# Patient Record
Sex: Female | Born: 1945
Health system: Southern US, Community
[De-identification: ages and names within clinical notes are randomized; demographics above are authoritative.]

## PROBLEM LIST (undated history)

## (undated) ENCOUNTER — Emergency Department: Admission: EM | Payer: Medicare HMO | Source: Home / Self Care

## (undated) DIAGNOSIS — N368 Other specified disorders of urethra: Secondary | ICD-10-CM

## (undated) DIAGNOSIS — E079 Disorder of thyroid, unspecified: Secondary | ICD-10-CM

## (undated) DIAGNOSIS — I440 Atrioventricular block, first degree: Secondary | ICD-10-CM

## (undated) DIAGNOSIS — M2041 Other hammer toe(s) (acquired), right foot: Secondary | ICD-10-CM

## (undated) DIAGNOSIS — R739 Hyperglycemia, unspecified: Secondary | ICD-10-CM

## (undated) DIAGNOSIS — H43391 Other vitreous opacities, right eye: Secondary | ICD-10-CM

## (undated) DIAGNOSIS — L84 Corns and callosities: Secondary | ICD-10-CM

## (undated) DIAGNOSIS — R630 Anorexia: Secondary | ICD-10-CM

## (undated) DIAGNOSIS — M4120 Other idiopathic scoliosis, site unspecified: Secondary | ICD-10-CM

## (undated) DIAGNOSIS — I10 Essential (primary) hypertension: Secondary | ICD-10-CM

## (undated) DIAGNOSIS — T7840XA Allergy, unspecified, initial encounter: Secondary | ICD-10-CM

## (undated) DIAGNOSIS — K219 Gastro-esophageal reflux disease without esophagitis: Secondary | ICD-10-CM

## (undated) DIAGNOSIS — E785 Hyperlipidemia, unspecified: Secondary | ICD-10-CM

## (undated) DIAGNOSIS — H9313 Tinnitus, bilateral: Secondary | ICD-10-CM

## (undated) DIAGNOSIS — F32A Depression, unspecified: Secondary | ICD-10-CM

## (undated) HISTORY — PX: BREAST SURGERY: SHX581

## (undated) HISTORY — PX: BREAST EXCISIONAL BIOPSY: SUR124

## (undated) HISTORY — DX: Anorexia: R63.0

## (undated) HISTORY — DX: Atrioventricular block, first degree: I44.0

## (undated) HISTORY — DX: Disorder of thyroid, unspecified: E07.9

## (undated) HISTORY — DX: Corns and callosities: L84

## (undated) HISTORY — DX: Allergy, unspecified, initial encounter: T78.40XA

## (undated) HISTORY — DX: Other vitreous opacities, right eye: H43.391

## (undated) HISTORY — DX: Other idiopathic scoliosis, site unspecified: M41.20

## (undated) HISTORY — DX: Other hammer toe(s) (acquired), right foot: M20.41

## (undated) HISTORY — DX: Essential (primary) hypertension: I10

## (undated) HISTORY — DX: Hyperlipidemia, unspecified: E78.5

## (undated) HISTORY — DX: Depression, unspecified: F32.A

## (undated) HISTORY — DX: Gastro-esophageal reflux disease without esophagitis: K21.9

## (undated) HISTORY — DX: Other specified disorders of urethra: N36.8

## (undated) HISTORY — DX: Tinnitus, bilateral: H93.13

## (undated) HISTORY — DX: Hyperglycemia, unspecified: R73.9

---

## 1987-10-28 HISTORY — PX: TUMOR EXCISION: SHX421

## 1987-10-28 HISTORY — PX: ABDOMINAL HYSTERECTOMY: SHX81

## 1987-10-28 HISTORY — PX: APPENDECTOMY: SHX54

## 2004-10-10 ENCOUNTER — Ambulatory Visit: Payer: Self-pay | Admitting: Internal Medicine

## 2005-05-28 ENCOUNTER — Ambulatory Visit: Payer: Self-pay

## 2006-02-26 ENCOUNTER — Other Ambulatory Visit: Payer: Self-pay

## 2006-02-26 ENCOUNTER — Emergency Department: Payer: Self-pay | Admitting: Internal Medicine

## 2006-06-16 ENCOUNTER — Ambulatory Visit: Payer: Self-pay

## 2006-08-10 ENCOUNTER — Ambulatory Visit: Payer: Self-pay | Admitting: Family Medicine

## 2006-12-03 ENCOUNTER — Emergency Department: Payer: Self-pay | Admitting: General Practice

## 2006-12-04 ENCOUNTER — Emergency Department: Payer: Self-pay | Admitting: Emergency Medicine

## 2007-08-18 ENCOUNTER — Ambulatory Visit: Payer: Self-pay

## 2008-04-01 ENCOUNTER — Emergency Department: Payer: Self-pay | Admitting: Emergency Medicine

## 2008-08-31 ENCOUNTER — Ambulatory Visit: Payer: Self-pay | Admitting: Family Medicine

## 2009-09-13 ENCOUNTER — Ambulatory Visit: Payer: Self-pay

## 2010-11-18 ENCOUNTER — Ambulatory Visit: Payer: Self-pay | Admitting: Family Medicine

## 2011-04-15 ENCOUNTER — Emergency Department: Payer: Self-pay | Admitting: Emergency Medicine

## 2011-05-21 ENCOUNTER — Emergency Department: Payer: Self-pay | Admitting: *Deleted

## 2011-06-24 LAB — HM COLONOSCOPY: HM Colonoscopy: NORMAL

## 2011-11-20 ENCOUNTER — Ambulatory Visit: Payer: Self-pay | Admitting: Family Medicine

## 2012-06-09 ENCOUNTER — Emergency Department: Payer: Self-pay | Admitting: Emergency Medicine

## 2012-06-09 LAB — COMPREHENSIVE METABOLIC PANEL
Albumin: 4.1 g/dL (ref 3.4–5.0)
Alkaline Phosphatase: 69 U/L (ref 50–136)
Bilirubin,Total: 0.4 mg/dL (ref 0.2–1.0)
Creatinine: 0.76 mg/dL (ref 0.60–1.30)
Glucose: 116 mg/dL — ABNORMAL HIGH (ref 65–99)
Osmolality: 278 (ref 275–301)
Sodium: 140 mmol/L (ref 136–145)

## 2012-06-09 LAB — TROPONIN I: Troponin-I: <0.02 ng/mL

## 2012-06-09 LAB — CBC
HCT: 38.1 % (ref 35.0–47.0)
MCHC: 33.7 g/dL (ref 32.0–36.0)
MCV: 89 fL (ref 80–100)
RBC: 4.26 10*6/uL (ref 3.80–5.20)
RDW: 14.3 % (ref 11.5–14.5)
WBC: 3.9 10*3/uL (ref 3.6–11.0)

## 2012-06-09 LAB — CK TOTAL AND CKMB (NOT AT ARMC)
CK, Total: 105 U/L (ref 21–215)
CK-MB: <0.5 ng/mL — ABNORMAL LOW (ref 0.5–3.6)

## 2012-07-05 ENCOUNTER — Emergency Department: Payer: Self-pay | Admitting: Emergency Medicine

## 2012-07-10 ENCOUNTER — Emergency Department: Payer: Self-pay | Admitting: Emergency Medicine

## 2012-07-10 LAB — CK TOTAL AND CKMB (NOT AT ARMC)
CK, Total: 148 U/L (ref 21–215)
CK-MB: 0.6 ng/mL (ref 0.5–3.6)

## 2012-07-10 LAB — TROPONIN I: Troponin-I: <0.02 ng/mL

## 2012-07-10 LAB — CBC
HGB: 12.9 g/dL (ref 12.0–16.0)
MCH: 29.8 pg (ref 26.0–34.0)
MCHC: 33.5 g/dL (ref 32.0–36.0)
Platelet: 209 10*3/uL (ref 150–440)
RBC: 4.33 10*6/uL (ref 3.80–5.20)
RDW: 14.2 % (ref 11.5–14.5)

## 2012-07-10 LAB — BASIC METABOLIC PANEL
Calcium, Total: 9.2 mg/dL (ref 8.5–10.1)
Chloride: 102 mmol/L (ref 98–107)
Co2: 28 mmol/L (ref 21–32)
EGFR (African American): >60
EGFR (Non-African Amer.): >60
Glucose: 120 mg/dL — ABNORMAL HIGH (ref 65–99)
Osmolality: 274 (ref 275–301)
Sodium: 138 mmol/L (ref 136–145)

## 2012-11-22 ENCOUNTER — Ambulatory Visit: Payer: Self-pay | Admitting: Family Medicine

## 2013-11-25 ENCOUNTER — Ambulatory Visit: Payer: Self-pay | Admitting: Family Medicine

## 2014-11-27 ENCOUNTER — Ambulatory Visit: Payer: Self-pay | Admitting: Family Medicine

## 2014-11-27 DIAGNOSIS — Z1231 Encounter for screening mammogram for malignant neoplasm of breast: Secondary | ICD-10-CM | POA: Diagnosis not present

## 2014-11-27 LAB — HM MAMMOGRAPHY: HM MAMMO: NORMAL

## 2014-12-01 DIAGNOSIS — J302 Other seasonal allergic rhinitis: Secondary | ICD-10-CM | POA: Diagnosis not present

## 2014-12-01 DIAGNOSIS — E039 Hypothyroidism, unspecified: Secondary | ICD-10-CM | POA: Diagnosis not present

## 2014-12-01 DIAGNOSIS — R739 Hyperglycemia, unspecified: Secondary | ICD-10-CM | POA: Diagnosis not present

## 2014-12-01 DIAGNOSIS — Z13220 Encounter for screening for lipoid disorders: Secondary | ICD-10-CM | POA: Diagnosis not present

## 2014-12-01 DIAGNOSIS — K219 Gastro-esophageal reflux disease without esophagitis: Secondary | ICD-10-CM | POA: Diagnosis not present

## 2014-12-01 DIAGNOSIS — Z79899 Other long term (current) drug therapy: Secondary | ICD-10-CM | POA: Diagnosis not present

## 2014-12-08 DIAGNOSIS — Z79899 Other long term (current) drug therapy: Secondary | ICD-10-CM | POA: Diagnosis not present

## 2014-12-08 DIAGNOSIS — Z13220 Encounter for screening for lipoid disorders: Secondary | ICD-10-CM | POA: Diagnosis not present

## 2014-12-08 DIAGNOSIS — R739 Hyperglycemia, unspecified: Secondary | ICD-10-CM | POA: Diagnosis not present

## 2014-12-08 DIAGNOSIS — E039 Hypothyroidism, unspecified: Secondary | ICD-10-CM | POA: Diagnosis not present

## 2014-12-08 LAB — LIPID PANEL
Cholesterol: 192 mg/dL (ref 0–200)
HDL: 51 mg/dL (ref 35–70)
LDL Cholesterol: 130 mg/dL
TRIGLYCERIDES: 55 mg/dL (ref 40–160)

## 2014-12-08 LAB — HEMOGLOBIN A1C: HEMOGLOBIN A1C: 6.1 % — AB (ref 4.0–6.0)

## 2015-01-11 DIAGNOSIS — M2041 Other hammer toe(s) (acquired), right foot: Secondary | ICD-10-CM | POA: Diagnosis not present

## 2015-01-11 DIAGNOSIS — H43391 Other vitreous opacities, right eye: Secondary | ICD-10-CM | POA: Diagnosis not present

## 2015-01-17 ENCOUNTER — Ambulatory Visit: Payer: Self-pay | Admitting: Podiatry

## 2015-01-31 ENCOUNTER — Ambulatory Visit: Payer: Commercial Managed Care - HMO | Admitting: Podiatry

## 2015-02-19 ENCOUNTER — Ambulatory Visit: Payer: Commercial Managed Care - HMO | Admitting: Podiatry

## 2015-04-02 ENCOUNTER — Encounter: Payer: Self-pay | Admitting: Family Medicine

## 2015-04-02 ENCOUNTER — Ambulatory Visit (INDEPENDENT_AMBULATORY_CARE_PROVIDER_SITE_OTHER): Payer: Commercial Managed Care - HMO | Admitting: Family Medicine

## 2015-04-02 DIAGNOSIS — E785 Hyperlipidemia, unspecified: Secondary | ICD-10-CM | POA: Insufficient documentation

## 2015-04-02 DIAGNOSIS — M419 Scoliosis, unspecified: Secondary | ICD-10-CM | POA: Insufficient documentation

## 2015-04-02 DIAGNOSIS — I8393 Asymptomatic varicose veins of bilateral lower extremities: Secondary | ICD-10-CM | POA: Insufficient documentation

## 2015-04-02 DIAGNOSIS — K219 Gastro-esophageal reflux disease without esophagitis: Secondary | ICD-10-CM

## 2015-04-02 DIAGNOSIS — N368 Other specified disorders of urethra: Secondary | ICD-10-CM | POA: Diagnosis not present

## 2015-04-02 DIAGNOSIS — M2041 Other hammer toe(s) (acquired), right foot: Secondary | ICD-10-CM | POA: Insufficient documentation

## 2015-04-02 DIAGNOSIS — L84 Corns and callosities: Secondary | ICD-10-CM | POA: Insufficient documentation

## 2015-04-02 DIAGNOSIS — E039 Hypothyroidism, unspecified: Secondary | ICD-10-CM | POA: Diagnosis not present

## 2015-04-02 MED ORDER — ESTROGENS, CONJUGATED 0.625 MG/GM VA CREA: 1.0000 g | TOPICAL_CREAM | Freq: Every day | VAGINAL | Status: DC

## 2015-04-02 NOTE — Patient Instructions (Signed)
Hypothyroidism The thyroid is a large gland located in the lower front of your neck. The thyroid gland helps control metabolism. Metabolism is how your body handles food. It controls metabolism with the hormone thyroxine. When this gland is underactive (hypothyroid), it produces too little hormone.  CAUSES These include:   Absence or destruction of thyroid tissue.  Goiter due to iodine deficiency.  Goiter due to medications.  Congenital defects (since birth).  Problems with the pituitary. This causes a lack of TSH (thyroid stimulating hormone). This hormone tells the thyroid to turn out more hormone. SYMPTOMS  Lethargy (feeling as though you have no energy)  Cold intolerance  Weight gain (in spite of normal food intake)  Dry skin  Coarse hair  Menstrual irregularity (if severe, may lead to infertility)  Slowing of thought processes Cardiac problems are also caused by insufficient amounts of thyroid hormone. Hypothyroidism in the newborn is cretinism, and is an extreme form. It is important that this form be treated adequately and immediately or it will lead rapidly to retarded physical and mental development. DIAGNOSIS  To prove hypothyroidism, your caregiver may do blood tests and ultrasound tests. Sometimes the signs are hidden. It may be necessary for your caregiver to watch this illness with blood tests either before or after diagnosis and treatment. TREATMENT  Low levels of thyroid hormone are increased by using synthetic thyroid hormone. This is a safe, effective treatment. It usually takes about four weeks to gain the full effects of the medication. After you have the full effect of the medication, it will generally take another four weeks for problems to leave. Your caregiver may start you on low doses. If you have had heart problems the dose may be gradually increased. It is generally not an emergency to get rapidly to normal. HOME CARE INSTRUCTIONS   Take your  medications as your caregiver suggests. Let your caregiver know of any medications you are taking or start taking. Your caregiver will help you with dosage schedules.  As your condition improves, your dosage needs may increase. It will be necessary to have continuing blood tests as suggested by your caregiver.  Report all suspected medication side effects to your caregiver. SEEK MEDICAL CARE IF: Seek medical care if you develop:  Sweating.  Tremulousness (tremors).  Anxiety.  Rapid weight loss.  Heat intolerance.  Emotional swings.  Diarrhea.  Weakness. SEEK IMMEDIATE MEDICAL CARE IF:  You develop chest pain, an irregular heart beat (palpitations), or a rapid heart beat. MAKE SURE YOU:   Understand these instructions.  Will watch your condition.  Will get help right away if you are not doing well or get worse. Document Released: 10/13/2005 Document Revised: 01/05/2012 Document Reviewed: 06/02/2008 Exit[employer] Patient Information 2015 [employer]. This information is not intended to replace advice given to you by your health care provider. Make sure you discuss any questions you have with your health care provider.  

## 2015-04-02 NOTE — Progress Notes (Signed)
Name: Maureen [name]   MRN: [id number]    DOB: September 07, 1946   Date:04/02/2015       Progress Note  Subjective  Chief Complaint  Chief Complaint  Patient presents with  . Allergic Rhinitis   . Hypothyroidism  . Hyperlipidemia  . Gastrophageal Reflux    HPI  Hypothyroidism: She is taking medication as prescribed and denies side effects. She denies fatigue, constipation, dry skin, or difficulty swallowing.  Hyperlipidemia: she never started taking Atorvastatin, she is aware of the increase risk of stroke and heart attacks but does not want to take a chance on side effects of medications  GERD: under control with medications, she is gaining weight again, and denies any symptoms.  Urethral prolapse:  She was diagnosed on her last CPE last fall, s/p hysterectomy and sometimes she sees a little bleeding when she wipes.  No dysuria, no blood when voiding.   Patient Active Problem List   Diagnosis Date Noted  . Hypothyroidism (acquired) 04/02/2015  . Gastroesophageal reflux disease 04/02/2015  . Hammer toe of right foot 04/02/2015  . Atrioventricular bloc first degree 04/02/2015  . Scoliosis of thoracic spine 04/02/2015  . Urethral prolapse 04/02/2015  . Corn of toe 04/02/2015  . Hyperlipidemia 04/02/2015  . Varicose veins of both lower extremities 04/02/2015    History  Substance Use Topics  . Smoking status: Never Smoker   . Smokeless tobacco: Never Used  . Alcohol Use: 0.0 oz/week    0 Standard drinks or equivalent per week     Comment: occasional wine     Current outpatient prescriptions:  .  calcium-vitamin D (OSCAL WITH D) 500-200 MG-UNIT per tablet, Take 1 tablet by mouth daily., Disp: , Rfl:  .  fluticasone (FLONASE) 50 MCG/ACT nasal spray, Place 1 spray into both nostrils daily., Disp: , Rfl:  .  Garlic Oil 1000 MG CAPS, Take 1 capsule by mouth daily., Disp: , Rfl:  .  glucose-Vitamin C 4-0.006 GM CHEW chewable tablet, Chew 1 tablet by mouth 1 day or 1 dose., Disp: ,  Rfl:  .  levothyroxine (SYNTHROID, LEVOTHROID) 75 MCG tablet, Take 75 mcg by mouth daily before breakfast., Disp: , Rfl:  .  ranitidine (ZANTAC) 300 MG capsule, Take 300 mg by mouth every evening., Disp: , Rfl:   Allergies  Allergen Reactions  . Codeine Nausea And Vomiting  . Penicillins Rash    ROS   Constitutional: Negative for fever Respiratory: Negative for cough and shortness of breath.   Cardiovascular: Negative for chest pain or palpitations.  Gastrointestinal: Negative for abdominal pain, no bowel changes.  Musculoskeletal: Negative for gait problem or joint swelling.  Skin: Negative for rash.  Neurological: Negative for dizziness or headache.  No other specific complaints in a complete review of systems (except as listed in HPI above).  Objective  Filed Vitals:   04/02/15 0934  BP: 120/66  Pulse: 88  Temp: 97.8 F (36.6 C)  TempSrc: Oral  Resp: 16  Height: 5' 1.75" (1.568 m)  Weight: 132 lb (59.875 kg)  SpO2: 96%     Physical Exam  Constitutional: Patient appears well-developed and well-nourished. No distress.  HENT: Head: Normocephalic and atraumatic. Ears: B TMs ok, no erythema or effusion; Nose: Nose normal. Mouth/Throat: Oropharynx is clear and [name]. No oropharyngeal exudate.  Eyes: Conjunctivae and EOM are normal. Pupils are equal, round, and reactive to light. No scleral icterus.  Neck: Normal range of motion. Neck supple. No JVD present. No thyromegaly present.  Cardiovascular:  Normal rate, regular rhythm and normal heart sounds.  No murmur heard. No BLE edema. Some spider veins and varicose veins Pulmonary/Chest: Effort normal and breath sounds normal. No respiratory distress. Abdominal: Soft. Bowel sounds are normal, no distension. There is no tenderness. no masses Musculoskeletal: Normal range of motion, no joint effusions. No gross deformities Neurological: he is alert and oriented to person, place, and time. No cranial nerve deficit. Coordination,  balance, strength, speech and gait are normal.  Skin: Skin is warm and dry. No rash noted. No erythema.  Psychiatric: Patient has a normal mood and affect. behavior is normal. Judgment and thought content normal.    Depression screen PHQ 2/9 04/02/2015  Decreased Interest 0  Down, Depressed, Hopeless 0  PHQ - 2 Score 0   Fall Risk: Fall Risk  04/02/2015  Falls in the past year? No      Assessment & Plan  1. Gastroesophageal reflux disease without esophagitis Controlled, continue medication 2. Hypothyroidism (acquired) Continue current dose of Synthroid, doing well 3. Urethral prolapse Causing some discomfort, we will try Premarin topically and return is no resolution

## 2015-04-11 DIAGNOSIS — H35371 Puckering of macula, right eye: Secondary | ICD-10-CM | POA: Diagnosis not present

## 2015-04-11 DIAGNOSIS — H2513 Age-related nuclear cataract, bilateral: Secondary | ICD-10-CM | POA: Diagnosis not present

## 2015-05-25 ENCOUNTER — Other Ambulatory Visit: Payer: Self-pay | Admitting: Family Medicine

## 2015-06-27 ENCOUNTER — Other Ambulatory Visit: Payer: Self-pay | Admitting: Family Medicine

## 2015-06-27 ENCOUNTER — Ambulatory Visit: Payer: Commercial Managed Care - HMO | Admitting: Family Medicine

## 2015-06-27 NOTE — Telephone Encounter (Signed)
Patient requesting refill. 

## 2015-06-29 ENCOUNTER — Encounter: Payer: Self-pay | Admitting: Family Medicine

## 2015-06-29 DIAGNOSIS — I440 Atrioventricular block, first degree: Secondary | ICD-10-CM | POA: Insufficient documentation

## 2015-06-29 DIAGNOSIS — M2040 Other hammer toe(s) (acquired), unspecified foot: Secondary | ICD-10-CM | POA: Insufficient documentation

## 2015-06-29 DIAGNOSIS — L84 Corns and callosities: Secondary | ICD-10-CM | POA: Insufficient documentation

## 2015-06-29 DIAGNOSIS — J302 Other seasonal allergic rhinitis: Secondary | ICD-10-CM | POA: Insufficient documentation

## 2015-06-29 DIAGNOSIS — R739 Hyperglycemia, unspecified: Secondary | ICD-10-CM | POA: Insufficient documentation

## 2015-06-29 DIAGNOSIS — H9313 Tinnitus, bilateral: Secondary | ICD-10-CM | POA: Insufficient documentation

## 2015-06-29 DIAGNOSIS — H43399 Other vitreous opacities, unspecified eye: Secondary | ICD-10-CM | POA: Insufficient documentation

## 2015-06-29 DIAGNOSIS — Z8679 Personal history of other diseases of the circulatory system: Secondary | ICD-10-CM | POA: Insufficient documentation

## 2015-07-04 ENCOUNTER — Ambulatory Visit: Payer: Commercial Managed Care - HMO | Admitting: Family Medicine

## 2015-07-20 ENCOUNTER — Ambulatory Visit (INDEPENDENT_AMBULATORY_CARE_PROVIDER_SITE_OTHER): Payer: Commercial Managed Care - HMO | Admitting: Family Medicine

## 2015-07-30 ENCOUNTER — Encounter: Payer: Self-pay | Admitting: Family Medicine

## 2015-07-30 ENCOUNTER — Ambulatory Visit (INDEPENDENT_AMBULATORY_CARE_PROVIDER_SITE_OTHER): Payer: Commercial Managed Care - HMO | Admitting: Family Medicine

## 2015-07-30 VITALS — BP 126/84 | HR 93 | Temp 98.1°F | Resp 18 | Ht 62.0 in | Wt 136.1 lb

## 2015-07-30 DIAGNOSIS — K219 Gastro-esophageal reflux disease without esophagitis: Secondary | ICD-10-CM

## 2015-07-30 DIAGNOSIS — E039 Hypothyroidism, unspecified: Secondary | ICD-10-CM

## 2015-07-30 DIAGNOSIS — R739 Hyperglycemia, unspecified: Secondary | ICD-10-CM

## 2015-07-30 DIAGNOSIS — Z79899 Other long term (current) drug therapy: Secondary | ICD-10-CM | POA: Diagnosis not present

## 2015-07-30 DIAGNOSIS — I8393 Asymptomatic varicose veins of bilateral lower extremities: Secondary | ICD-10-CM

## 2015-07-30 DIAGNOSIS — E785 Hyperlipidemia, unspecified: Secondary | ICD-10-CM

## 2015-07-30 DIAGNOSIS — Z23 Encounter for immunization: Secondary | ICD-10-CM | POA: Diagnosis not present

## 2015-07-30 DIAGNOSIS — M25462 Effusion, left knee: Secondary | ICD-10-CM | POA: Diagnosis not present

## 2015-07-30 NOTE — Progress Notes (Signed)
Name: Maureen [name]   MRN: [id number]    DOB: 1946-01-24   Date:07/30/2015       Progress Note  Subjective  Chief Complaint  Chief Complaint  Patient presents with  . Medication Refill    follow-up  . Hypothyroidism  . Gastrophageal Reflux  . Edema    left leg onset-2days patient states has been soaking in epsion salt    HPI    Hypothyroidism: She is taking medication as prescribed and denies side effects. She denies fatigue, constipation, dry skin, or difficulty swallowing. Current dose of Synthroid is 75 mcg daily   Hyperlipidemia: she never started taking Atorvastatin, she is aware of the increase risk of stroke and heart attacks but does not want to take a chance on side effects of medications.   GERD: under control with medications, she is gaining weight again, and denies any symptoms, she is down to one pill daily and denies side effects.  Hyperglycemia: on diet and exercise regiment only, last hgbA1C in Feb 2016 was 6.1 %.  Left Knee effusion: she denies any pain, but noticed an effusion of left knee, no redness, no increase in warmth. No previous history of gout, she has a family history of RA, but none of her other joints are causing problems at this time.   Seasonal Allergies: takes medications prn   Patient Active Problem List   Diagnosis Date Noted  . Effusion of left knee 07/30/2015  . Allergic rhinitis, seasonal 06/29/2015  . Clavus 06/29/2015  . 1St degree AV block 06/29/2015  . Hammer toe 06/29/2015  . H/O: HTN (hypertension) 06/29/2015  . Blood glucose elevated 06/29/2015  . Floater, vitreous 06/29/2015  . Bilateral tinnitus 06/29/2015  . Hypothyroidism (acquired) 04/02/2015  . Gastroesophageal reflux disease 04/02/2015  . Hammer toe of right foot 04/02/2015  . Scoliosis of thoracic spine 04/02/2015  . Urethral prolapse 04/02/2015  . Corn of toe 04/02/2015  . Hyperlipidemia 04/02/2015  . Varicose veins of both lower extremities 04/02/2015    Past  Surgical History  Procedure Laterality Date  . Abdominal hysterectomy  1989  . Tumor excision  1989    same time as hysterectomy  . Breast surgery      Several  . Appendectomy  1989    Family History  Problem Relation Age of Onset  . Diabetes Father 83    DM complications  . Hypertension Mother 29    CKD  . Thyroid disease Mother   . Heart failure Mother   . Kidney disease Mother   . Kidney disease    . Cancer Sister     Breast    Social History   Social History  . Marital Status: Divorced    Spouse Name: N/A  . Number of Children: N/A  . Years of Education: N/A   Occupational History  . Not on file.   Social History Main Topics  . Smoking status: Never Smoker   . Smokeless tobacco: Never Used  . Alcohol Use: 0.0 oz/week    0 Standard drinks or equivalent per week     Comment: occasional wine  . Drug Use: No  . Sexual Activity: Not Currently   Other Topics Concern  . Not on file   Social History Narrative     Current outpatient prescriptions:  .  calcium-vitamin D (OSCAL WITH D) 500-200 MG-UNIT per tablet, Take 1 tablet by mouth daily., Disp: , Rfl:  .  fluticasone (FLONASE) 50 MCG/ACT nasal spray, Place 1 spray  into both nostrils daily., Disp: , Rfl:  .  Garlic Oil 5852 MG CAPS, Take 1 capsule by mouth daily., Disp: , Rfl:  .  glucose-Vitamin C 4-0.006 GM CHEW chewable tablet, Chew 1 tablet by mouth 1 day or 1 dose., Disp: , Rfl:  .  levothyroxine (SYNTHROID, LEVOTHROID) 75 MCG tablet, TAKE ONE TABLET BY MOUTH ONCE DAILY, Disp: 30 tablet, Rfl: 2 .  ranitidine (ZANTAC) 300 MG capsule, Take 300 mg by mouth every evening., Disp: , Rfl:   Allergies  Allergen Reactions  . Aspirin     Tinnitus  . Citalopram Other (See Comments)  . Codeine Nausea And Vomiting  . Penicillins Rash     ROS  Constitutional: Negative for fever , mild  weight change.  Respiratory: Negative for cough and shortness of breath.   Cardiovascular: Negative for chest pain or  palpitations.  Gastrointestinal: Negative for abdominal pain, no bowel changes.  Musculoskeletal: Negative for gait problem or joint swelling.  Skin: Negative for rash.  Neurological: Negative for dizziness or headache.  No other specific complaints in a complete review of systems (except as listed in HPI above).  Objective  Filed Vitals:   07/30/15 0821  BP: 126/84  Pulse: 93  Temp: 98.1 F (36.7 C)  TempSrc: Oral  Resp: 18  Height: _0  (1.575 m)  Weight: 136 lb 1.6 oz (61.735 kg)  SpO2: 94%    Body mass index is 24.89 kg/(m^2).  Physical Exam  Constitutional: Patient appears well-developed and well-nourished.  No distress.  HEENT: head atraumatic, normocephalic, pupils equal and reactive to light, neck supple, throat within normal limits Cardiovascular: Normal rate, regular rhythm and normal heart sounds.  No murmur heard. No BLE edema. Varicose veins ( spider veing both lower legs)  Pulmonary/Chest: Effort normal and breath sounds normal. No respiratory distress. Abdominal: Soft.  There is no tenderness. Psychiatric: Patient has a normal mood and affect. behavior is normal. Judgment and thought content normal. Muscular Skeletal: crepitus with extension of left knee, no redness or increase in warmth, normal ROM   PHQ2/9: Depression screen [facility] 2/9 07/30/2015 04/02/2015  Decreased Interest 0 0  Down, Depressed, Hopeless 0 0  PHQ - 2 Score 0 0    Fall Risk: Fall Risk  07/30/2015 04/02/2015  Falls in the past year? No No     Functional Status Survey: Is the patient deaf or have difficulty hearing?: No Does the patient have difficulty seeing, even when wearing glasses/contacts?: Yes (reading glasses) Does the patient have difficulty concentrating, remembering, or making decisions?: No Does the patient have difficulty walking or climbing stairs?: No Does the patient have difficulty dressing or bathing?: No Does the patient have difficulty doing errands alone such as  visiting a doctor's office or shopping?: Yes (does not drive)   Assessment & Plan  1. Gastroesophageal reflux disease without esophagitis  Doing well with one pill daily   2. Needs flu shot  - Flu vaccine HIGH DOSE PF (Fluzone High dose) - refused  3. Hypothyroidism (acquired)   Recheck level  - TSH  4. Hyperlipidemia  Refused to take statin therapy   5. Varicose veins of both lower extremities   Sometimes needs to raise her legs, usually at the end of the day, not significant  6. Effusion of left knee  Likely OA with crepitus with extension of left knee, advised to take ibuprofen for a few days and if no resolution call back for x-ray - CBC with Differential/Platelet - Sed Rate (  ESR)  7. Need for shingles vaccine  - Varicella-zoster vaccine subcutaneous -refused  8. Need for Tdap vaccination  - Tdap vaccine greater than or equal to 7yo IM - refused  9. Need for pneumococcal vaccination  - Pneumococcal conjugate vaccine 13-valent IM -refused - Pneumococcal polysaccharide vaccine 23-valent greater than or equal to 2yo subcutaneous/IM -refused 10. Hyperglycemia  Discussed diet and exercise - Hemoglobin A1c  11. Long-term use of high-risk medication  - Comprehensive metabolic panel

## 2015-07-30 NOTE — Patient Instructions (Signed)

## 2015-07-31 LAB — COMPREHENSIVE METABOLIC PANEL
A/G RATIO: 1.5 (ref 1.1–2.5)
ALT: 17 IU/L (ref 0–32)
AST: 27 IU/L (ref 0–40)
Albumin: 4.6 g/dL (ref 3.6–4.8)
Alkaline Phosphatase: 85 IU/L (ref 39–117)
BILIRUBIN TOTAL: 0.3 mg/dL (ref 0.0–1.2)
BUN/Creatinine Ratio: 11 (ref 11–26)
BUN: 8 mg/dL (ref 8–27)
CALCIUM: 9.3 mg/dL (ref 8.7–10.3)
CHLORIDE: 100 mmol/L (ref 97–108)
CO2: 28 mmol/L (ref 18–29)
Creatinine, Ser: 0.73 mg/dL (ref 0.57–1.00)
GFR calc Af Amer: 97 mL/min/{1.73_m2} (ref >59)
GFR, EST NON AFRICAN AMERICAN: 84 mL/min/{1.73_m2} (ref >59)
GLUCOSE: 54 mg/dL — AB (ref 65–99)
Globulin, Total: 3 g/dL (ref 1.5–4.5)
POTASSIUM: 4.9 mmol/L (ref 3.5–5.2)
Sodium: 142 mmol/L (ref 134–144)
Total Protein: 7.6 g/dL (ref 6.0–8.5)

## 2015-07-31 LAB — CBC WITH DIFFERENTIAL/PLATELET
BASOS ABS: 0 10*3/uL (ref 0.0–0.2)
Basos: 1 %
EOS (ABSOLUTE): 0.2 10*3/uL (ref 0.0–0.4)
Eos: 4 %
Hematocrit: 35.4 % (ref 34.0–46.6)
Hemoglobin: 12.2 g/dL (ref 11.1–15.9)
IMMATURE GRANS (ABS): 0 10*3/uL (ref 0.0–0.1)
IMMATURE GRANULOCYTES: 0 %
LYMPHS: 53 %
Lymphocytes Absolute: 2.3 10*3/uL (ref 0.7–3.1)
MCH: 29.8 pg (ref 26.6–33.0)
MCHC: 34.5 g/dL (ref 31.5–35.7)
MCV: 87 fL (ref 79–97)
Monocytes Absolute: 0.5 10*3/uL (ref 0.1–0.9)
Monocytes: 11 %
NEUTROS PCT: 31 %
Neutrophils Absolute: 1.3 10*3/uL — ABNORMAL LOW (ref 1.4–7.0)
PLATELETS: 228 10*3/uL (ref 150–379)
RBC: 4.09 x10E6/uL (ref 3.77–5.28)
RDW: 14 % (ref 12.3–15.4)
WBC: 4.3 10*3/uL (ref 3.4–10.8)

## 2015-07-31 LAB — SEDIMENTATION RATE: SED RATE: 9 mm/h (ref 0–40)

## 2015-07-31 LAB — HEMOGLOBIN A1C
ESTIMATED AVERAGE GLUCOSE: 128 mg/dL
Hgb A1c MFr Bld: 6.1 % — ABNORMAL HIGH (ref 4.8–5.6)

## 2015-07-31 LAB — TSH: TSH: 2.47 u[IU]/mL (ref 0.450–4.500)

## 2015-08-06 ENCOUNTER — Telehealth: Payer: Self-pay | Admitting: Family Medicine

## 2015-08-06 NOTE — Telephone Encounter (Signed)
Patient is requesting that you mail her the lab results along with the directions as to what she need to do to get her numbers down.

## 2015-08-07 NOTE — Progress Notes (Signed)
Pt notified and labs mailed to her

## 2015-09-18 ENCOUNTER — Other Ambulatory Visit: Payer: Self-pay | Admitting: Family Medicine

## 2015-09-18 NOTE — Telephone Encounter (Signed)
Patient requesting refill. 

## 2015-10-31 ENCOUNTER — Ambulatory Visit: Payer: Commercial Managed Care - HMO | Admitting: Family Medicine

## 2015-11-07 ENCOUNTER — Ambulatory Visit: Payer: Commercial Managed Care - HMO | Admitting: Family Medicine

## 2015-11-09 ENCOUNTER — Encounter: Payer: Self-pay | Admitting: Family Medicine

## 2015-11-09 ENCOUNTER — Ambulatory Visit (INDEPENDENT_AMBULATORY_CARE_PROVIDER_SITE_OTHER): Payer: Commercial Managed Care - HMO | Admitting: Family Medicine

## 2015-11-09 VITALS — BP 132/76 | HR 100 | Temp 97.1°F | Resp 16 | Wt 135.2 lb

## 2015-11-09 DIAGNOSIS — I8393 Asymptomatic varicose veins of bilateral lower extremities: Secondary | ICD-10-CM | POA: Diagnosis not present

## 2015-11-09 DIAGNOSIS — E039 Hypothyroidism, unspecified: Secondary | ICD-10-CM | POA: Diagnosis not present

## 2015-11-09 DIAGNOSIS — K219 Gastro-esophageal reflux disease without esophagitis: Secondary | ICD-10-CM | POA: Diagnosis not present

## 2015-11-09 DIAGNOSIS — E785 Hyperlipidemia, unspecified: Secondary | ICD-10-CM | POA: Diagnosis not present

## 2015-11-09 DIAGNOSIS — J302 Other seasonal allergic rhinitis: Secondary | ICD-10-CM | POA: Diagnosis not present

## 2015-11-09 MED ORDER — FLUTICASONE PROPIONATE 50 MCG/ACT NA SUSP: 1.0000 | Freq: Every day | NASAL | Status: DC

## 2015-11-09 MED ORDER — LEVOTHYROXINE SODIUM 75 MCG PO TABS: 75.0000 ug | ORAL_TABLET | Freq: Every day | ORAL | Status: DC

## 2015-11-09 MED ORDER — B COMPLEX VITAMINS PO CAPS: 1.0000 | ORAL_CAPSULE | Freq: Every day | ORAL | Status: DC

## 2015-11-09 MED ORDER — RANITIDINE HCL 300 MG PO CAPS: 300.0000 mg | ORAL_CAPSULE | Freq: Every evening | ORAL | Status: DC

## 2015-11-09 NOTE — Progress Notes (Signed)
Name: Maureen [name]   MRN: [id number]    DOB: [birth year]   Date:11/09/2015       Progress Note  Subjective  Chief Complaint  Chief Complaint  Patient presents with  . Medication Refill    3 month F/U  . Gastroesophageal Reflux  . Hypothyroidism    HPI  Hypothyroidism: She is taking medication as prescribed and denies side effects. She denies fatigue, constipation, dry skin, or difficulty swallowing. Current dose of Synthroid is 75 mcg daily , she likes coming in every 3 months to be checked. Last TSH at goal, we will recheck level every 6 months  Hyperlipidemia: she never started taking Atorvastatin, she is aware of the increase risk of stroke and heart attacks but does not want to take a chance on side effects of medications.   GERD: under control with medications, she is gaining weight again, and denies any symptoms, she is down to one pill daily and denies side effects.No regurgitation or heart burn  Hyperglycemia: on diet and exercise regiment only, last hgbA1C in Feb 2016 was 6.1 %.  Left Knee effusion: refused  Varicose Veins: since middle school, she denies pain or swelling  Seasonal Allergies: using nasal spray prn, usually when she has nasal congestion, no rhinorrhea. Symptoms are worse this time of the year.   Patient Active Problem List   Diagnosis Date Noted  . Effusion of left knee 07/30/2015  . Allergic rhinitis, seasonal 06/29/2015  . Clavus 06/29/2015  . 1St degree AV block 06/29/2015  . Hammer toe 06/29/2015  . H/O: HTN (hypertension) 06/29/2015  . Blood glucose elevated 06/29/2015  . Floater, vitreous 06/29/2015  . Bilateral tinnitus 06/29/2015  . Hypothyroidism (acquired) 04/02/2015  . Gastroesophageal reflux disease 04/02/2015  . Hammer toe of right foot 04/02/2015  . Scoliosis of thoracic spine 04/02/2015  . Urethral prolapse 04/02/2015  . Corn of toe 04/02/2015  . Hyperlipidemia 04/02/2015  . Varicose veins of both lower extremities 04/02/2015     Past Surgical History  Procedure Laterality Date  . Abdominal hysterectomy  1989  . Tumor excision  1989    same time as hysterectomy  . Breast surgery      Several  . Appendectomy  1989    Family History  Problem Relation Age of Onset  . Diabetes Father 62    DM complications  . Hypertension Mother 72    CKD  . Thyroid disease Mother   . Heart failure Mother   . Kidney disease Mother   . Kidney disease    . Cancer Sister     Breast    Social History   Social History  . Marital Status: Divorced    Spouse Name: N/A  . Number of Children: N/A  . Years of Education: N/A   Occupational History  . Not on file.   Social History Main Topics  . Smoking status: Never Smoker   . Smokeless tobacco: Never Used  . Alcohol Use: 0.0 oz/week    0 Standard drinks or equivalent per week     Comment: occasional wine  . Drug Use: No  . Sexual Activity: Not Currently   Other Topics Concern  . Not on file   Social History Narrative     Current outpatient prescriptions:  .  calcium-vitamin D (OSCAL WITH D) 500-200 MG-UNIT per tablet, Take 1 tablet by mouth daily., Disp: , Rfl:  .  fluticasone (FLONASE) 50 MCG/ACT nasal spray, Place 1 spray into both nostrils daily., Disp:  16 g, Rfl: 2 .  Garlic Oil 1000 MG CAPS, Take 1 capsule by mouth daily., Disp: , Rfl:  .  glucose-Vitamin C 4-0.006 GM CHEW chewable tablet, Chew 1 tablet by mouth 1 day or 1 dose., Disp: , Rfl:  .  levothyroxine (SYNTHROID, LEVOTHROID) 75 MCG tablet, Take 1 tablet (75 mcg total) by mouth daily., Disp: 30 tablet, Rfl: 2 .  ranitidine (ZANTAC) 300 MG capsule, Take 1 capsule (300 mg total) by mouth every evening., Disp: 30 capsule, Rfl: 2 .  b complex vitamins capsule, Take 1 capsule by mouth daily., Disp: 30 capsule, Rfl: 0  Allergies  Allergen Reactions  . Aspirin     Tinnitus  . Citalopram Other (See Comments)  . Codeine Nausea And Vomiting  . Penicillins Rash     ROS  Constitutional: Negative  for fever or weight change.  Respiratory: Negative for cough and shortness of breath.   Cardiovascular: Negative for chest pain or palpitations.  Gastrointestinal: Negative for abdominal pain, no bowel changes.  Musculoskeletal: Negative for gait problem or joint swelling.  Skin: Negative for rash.  Neurological: Negative for dizziness or headache.  No other specific complaints in a complete review of systems (except as listed in HPI above).  Objective  Filed Vitals:   11/09/15 1122  BP: 132/76  Pulse: 100  Temp: 97.1 F (36.2 C)  TempSrc: Oral  Resp: 16  Weight: 135 lb 3.2 oz (61.326 kg)  SpO2: 99%    Body mass index is 24.72 kg/(m^2).  Physical Exam   Constitutional: Patient appears well-developed and well-nourished. No distress.  HEENT: head atraumatic, normocephalic, pupils equal and reactive to light, neck supple, throat within normal limits, no goiter  Cardiovascular: Normal rate, regular rhythm and normal heart sounds. No murmur heard. No BLE edema. Varicose veins ( spider veing both lower legs)  Pulmonary/Chest: Effort normal and breath sounds normal. No respiratory distress. Abdominal: Soft. There is no tenderness. Psychiatric: Patient has a normal mood and affect. behavior is normal. Judgment and thought content normal. Muscular Skeletal: scoliosis   PHQ2/9: [facility] 11/09/2015 07/30/2015 04/02/2015  Decreased Interest 0 0 0  Down, Depressed, Hopeless 0 0 0  PHQ - 2 Score 0 0 0     Fall Risk: Fall Risk  11/09/2015 07/30/2015 04/02/2015  Falls in the past year? No No No      Functional Status Survey: Is the patient deaf or have difficulty hearing?: No Does the patient have difficulty seeing, even when wearing glasses/contacts?: No Does the patient have difficulty concentrating, remembering, or making decisions?: No Does the patient have difficulty walking or climbing stairs?: No Does the patient have difficulty dressing or bathing?:  No Does the patient have difficulty doing errands alone such as visiting a doctor's office or shopping?: No    Assessment & Plan  1. Hypothyroidism (acquired)  - levothyroxine (SYNTHROID, LEVOTHROID) 75 MCG tablet; Take 1 tablet (75 mcg total) by mouth daily.  Dispense: 30 tablet; Refill: 2  2. Gastroesophageal reflux disease without esophagitis  - ranitidine (ZANTAC) 300 MG capsule; Take 1 capsule (300 mg total) by mouth every evening.  Dispense: 30 capsule; Refill: 2  3. Varicose veins of both lower extremities  No pain at this time.   4. Hyperlipidemia  On diet only   5. Allergic rhinitis, seasonal  - fluticasone (FLONASE) 50 MCG/ACT nasal spray; Place 1 spray into both nostrils daily.  Dispense: 16 g; Refill: 2

## 2015-11-19 ENCOUNTER — Other Ambulatory Visit: Payer: Self-pay | Admitting: Family Medicine

## 2015-11-19 DIAGNOSIS — Z1231 Encounter for screening mammogram for malignant neoplasm of breast: Secondary | ICD-10-CM

## 2015-11-29 ENCOUNTER — Telehealth: Payer: Self-pay

## 2015-11-29 NOTE — Telephone Encounter (Signed)
Patient called stating she is coming in today to pay on her bill and wanted to know if Dr. Carlynn Purl could give her something for her cold. She stated that it has been going on since last Thursday and has not gotten any better. She was told that we would give this message to Dr. Carlynn Purl then see what she suggests.

## 2015-11-29 NOTE — Telephone Encounter (Signed)
She needs to use saline spray and Mucinex DM. OTC medications

## 2015-11-29 NOTE — Telephone Encounter (Signed)
Patient will be informed when she comes in today.

## 2015-12-03 ENCOUNTER — Other Ambulatory Visit: Payer: Self-pay | Admitting: Family Medicine

## 2015-12-03 ENCOUNTER — Ambulatory Visit
Admission: RE | Admit: 2015-12-03 | Discharge: 2015-12-03 | Disposition: A | Discharge status: Home / Self Care | Payer: Commercial Managed Care - HMO | Source: Ambulatory Visit | Attending: Family Medicine | Admitting: Family Medicine

## 2015-12-03 DIAGNOSIS — Z1231 Encounter for screening mammogram for malignant neoplasm of breast: Secondary | ICD-10-CM | POA: Diagnosis not present

## 2016-01-18 ENCOUNTER — Other Ambulatory Visit: Payer: Self-pay | Admitting: Family Medicine

## 2016-01-21 ENCOUNTER — Telehealth: Payer: Self-pay

## 2016-01-21 NOTE — Telephone Encounter (Signed)
Patient called wanting a refill of her Zantac 300mg . Patient was informed that a new rx was sent in on 01/18/16 with 5 refills so all she had to do was call her pharmacy.

## 2016-01-22 NOTE — Telephone Encounter (Signed)
Erroneous entry

## 2016-02-13 ENCOUNTER — Ambulatory Visit: Payer: Commercial Managed Care - HMO | Admitting: Family Medicine

## 2016-02-13 ENCOUNTER — Encounter: Payer: Self-pay | Admitting: Family Medicine

## 2016-02-13 ENCOUNTER — Ambulatory Visit (INDEPENDENT_AMBULATORY_CARE_PROVIDER_SITE_OTHER): Payer: Commercial Managed Care - HMO | Admitting: Family Medicine

## 2016-02-13 VITALS — BP 140/88 | HR 85 | Temp 98.1°F | Resp 18 | Ht 62.0 in | Wt 137.3 lb

## 2016-02-13 DIAGNOSIS — I8393 Asymptomatic varicose veins of bilateral lower extremities: Secondary | ICD-10-CM | POA: Diagnosis not present

## 2016-02-13 DIAGNOSIS — K219 Gastro-esophageal reflux disease without esophagitis: Secondary | ICD-10-CM | POA: Diagnosis not present

## 2016-02-13 DIAGNOSIS — R030 Elevated blood-pressure reading, without diagnosis of hypertension: Secondary | ICD-10-CM

## 2016-02-13 DIAGNOSIS — E785 Hyperlipidemia, unspecified: Secondary | ICD-10-CM | POA: Diagnosis not present

## 2016-02-13 DIAGNOSIS — E039 Hypothyroidism, unspecified: Secondary | ICD-10-CM | POA: Diagnosis not present

## 2016-02-13 DIAGNOSIS — R739 Hyperglycemia, unspecified: Secondary | ICD-10-CM | POA: Diagnosis not present

## 2016-02-13 DIAGNOSIS — IMO0001 Reserved for inherently not codable concepts without codable children: Secondary | ICD-10-CM

## 2016-02-13 MED ORDER — LEVOTHYROXINE SODIUM 75 MCG PO TABS: 75.0000 ug | ORAL_TABLET | Freq: Every day | ORAL | Status: DC

## 2016-02-13 NOTE — Progress Notes (Signed)
Name: Maureen [name]   MRN: [id number]    DOB: 31-Jul-1946   Date:02/13/2016       Progress Note  Subjective  Chief Complaint  Chief Complaint  Patient presents with  . Medication Refill    3 month F/U  . Hyperlipidemia  . Allergic Rhinitis     Well controlled   . Gastroesophageal Reflux    Controlled with medication  . Hypothyroidism    Heat and Cold Intolerance, Stable  . Varicose Veins    Legs have been bothering her    HPI  Hypothyroidism: She is taking medication as prescribed and denies side effects. She denies fatigue, constipation, dry skin, or difficulty swallowing. Current dose of Synthroid is 75 mcg daily , she likes coming in every 3 months to be checked. Last TSH at goal.  Hyperlipidemia: she never started taking Atorvastatin, she is aware of the increase risk of stroke and heart attacks but does not want to take a chance on side effects of medications.   GERD: under control with medications, she is gaining weight again, and denies any symptoms, she is down to one pill daily and denies side effects.No regurgitation or heart burn  Hyperglycemia: on diet and exercise regiment only, last hgbA1C in 07/2015  was 6.1 %. She denies polyphagia, polydipsia and polyuria  Varicose Veins: since middle school, she has intermittent leg pain.   Seasonal Allergies: using nasal spray prn, usually when she has nasal congestion, no rhinorrhea. Symptoms are worse this time of the year and she has noticed some sneezing, but she does not want to add any medications at this time.    Patient Active Problem List   Diagnosis Date Noted  . Effusion of left knee 07/30/2015  . Allergic rhinitis, seasonal 06/29/2015  . Clavus 06/29/2015  . 1St degree AV block 06/29/2015  . Hammer toe 06/29/2015  . H/O: HTN (hypertension) 06/29/2015  . Blood glucose elevated 06/29/2015  . Floater, vitreous 06/29/2015  . Bilateral tinnitus 06/29/2015  . Hypothyroidism (acquired) 04/02/2015  .  Gastroesophageal reflux disease 04/02/2015  . Hammer toe of right foot 04/02/2015  . Scoliosis of thoracic spine 04/02/2015  . Urethral prolapse 04/02/2015  . Corn of toe 04/02/2015  . Hyperlipidemia 04/02/2015  . Varicose veins of both lower extremities 04/02/2015    Past Surgical History  Procedure Laterality Date  . Abdominal hysterectomy  1989  . Tumor excision  1989    same time as hysterectomy  . Breast surgery      Several  . Appendectomy  1989  . Breast excisional biopsy Bilateral     multiple biopsies negative    Family History  Problem Relation Age of Onset  . Diabetes Father 73    DM complications  . Hypertension Mother 84    CKD  . Thyroid disease Mother   . Heart failure Mother   . Kidney disease Mother   . Kidney disease    . Cancer Sister     Breast  . Breast cancer Sister 66  . Breast cancer Maternal Aunt   . Breast cancer Paternal Aunt     Social History   Social History  . Marital Status: Divorced    Spouse Name: N/A  . Number of Children: N/A  . Years of Education: N/A   Occupational History  . Not on file.   Social History Main Topics  . Smoking status: Never Smoker   . Smokeless tobacco: Never Used  . Alcohol Use: 0.0 oz/week  0 Standard drinks or equivalent per week     Comment: occasional wine  . Drug Use: No  . Sexual Activity: Not Currently   Other Topics Concern  . Not on file   Social History Narrative     Current outpatient prescriptions:  .  b complex vitamins capsule, Take 1 capsule by mouth daily., Disp: 30 capsule, Rfl: 0 .  calcium-vitamin D (OSCAL WITH D) 500-200 MG-UNIT per tablet, Take 1 tablet by mouth daily., Disp: , Rfl:  .  fluticasone (FLONASE) 50 MCG/ACT nasal spray, Place 1 spray into both nostrils daily., Disp: 16 g, Rfl: 2 .  Garlic Oil 1000 MG CAPS, Take 1 capsule by mouth daily., Disp: , Rfl:  .  glucose-Vitamin C 4-0.006 GM CHEW chewable tablet, Chew 1 tablet by mouth 1 day or 1 dose., Disp: , Rfl:   .  levothyroxine (SYNTHROID, LEVOTHROID) 75 MCG tablet, Take 1 tablet (75 mcg total) by mouth daily., Disp: 30 tablet, Rfl: 2 .  ranitidine (ZANTAC) 300 MG tablet, TAKE ONE TABLET BY MOUTH TWICE DAILY* TRY TAKING OMEPRAZOLE EVERY OTHER DAY*, Disp: 60 tablet, Rfl: 5  Allergies  Allergen Reactions  . Aspirin     Tinnitus  . Citalopram Other (See Comments)  . Codeine Nausea And Vomiting  . Penicillins Rash     ROS  Constitutional: Negative for fever or weight change.  Respiratory: Negative for cough and shortness of breath.   Cardiovascular: Negative for chest pain or palpitations.  Gastrointestinal: Negative for abdominal pain, no bowel changes.  Musculoskeletal: Negative for gait problem, positive for  joint swelling - on left knee   Skin: Negative for rash.  Neurological: Negative for dizziness or headache.  No other specific complaints in a complete review of systems (except as listed in HPI above).  Objective  Filed Vitals:   02/13/16 1033  BP: 136/92  Pulse: 85  Temp: 98.1 F (36.7 C)  TempSrc: Oral  Resp: 18  Height: 5\' 2"  (1.575 m)  Weight: 137 lb 4.8 oz (62.279 kg)  SpO2: 96%    Body mass index is 25.11 kg/(m^2).  Physical Exam  Constitutional: Patient appears well-developed and well-nourished.  No distress.  HEENT: head atraumatic, normocephalic, pupils equal and reactive to light,  neck supple, throat within normal limits Cardiovascular: Normal rate, regular rhythm and normal heart sounds.  No murmur heard. No BLE edema. Varicose both veins Pulmonary/Chest: Effort normal and breath sounds normal. No respiratory distress. Abdominal: Soft.  There is no tenderness. Psychiatric: Patient has a normal mood and affect. behavior is normal. Judgment and thought content normal. Muscular Skeletal: decrease rom of both  hip, also has scoliosis and grinding with extension of left knee but no effusion  PH[facility] 02/13/2016 11/09/2015 07/30/2015  04/02/2015  Decreased Interest 0 0 0 0  Down, Depressed, Hopeless 0 0 0 0  PHQ - 2 Score 0 0 0 0     Fall Risk: Fall Risk  02/13/2016 11/09/2015 07/30/2015 04/02/2015  Falls in the past year? No No No No      Functional Status Survey: Is the patient deaf or have difficulty hearing?: No Does the patient have difficulty seeing, even when wearing glasses/contacts?: No Does the patient have difficulty concentrating, remembering, or making decisions?: No Does the patient have difficulty walking or climbing stairs?: No Does the patient have difficulty dressing or bathing?: No Does the patient have difficulty doing errands alone such as visiting a doctor's office or shopping?: No  Assessment & Plan  1. Hypothyroidism (acquired)  - TSH Continue current dose of Synthroid.  - levothyroxine (SYNTHROID, LEVOTHROID) 75 MCG tablet; Take 1 tablet (75 mcg total) by mouth daily.  Dispense: 30 tablet; Refill: 2   2. Hyperlipidemia  - Lipid panel  3. Blood glucose elevated  - Comprehensive metabolic panel - Hemoglobin A1c  4. Elevated blood pressure  - Comprehensive metabolic panel, used to take bp medications, states eating more salt lately but will resume her diet, she does not want to take bp pills at this time. We will monitor it for now  5. Varicose veins of both lower extremities  Needs to wear compression hoses  6. Gastroesophageal reflux disease without esophagitis  Continue Ranitidine prn

## 2016-03-14 DIAGNOSIS — R739 Hyperglycemia, unspecified: Secondary | ICD-10-CM | POA: Diagnosis not present

## 2016-03-14 DIAGNOSIS — E039 Hypothyroidism, unspecified: Secondary | ICD-10-CM | POA: Diagnosis not present

## 2016-03-14 DIAGNOSIS — E785 Hyperlipidemia, unspecified: Secondary | ICD-10-CM | POA: Diagnosis not present

## 2016-03-14 DIAGNOSIS — R030 Elevated blood-pressure reading, without diagnosis of hypertension: Secondary | ICD-10-CM | POA: Diagnosis not present

## 2016-03-15 LAB — COMPREHENSIVE METABOLIC PANEL
A/G RATIO: 1.5 (ref 1.2–2.2)
ALT: 15 IU/L (ref 0–32)
AST: 25 IU/L (ref 0–40)
Albumin: 4.4 g/dL (ref 3.5–4.8)
Alkaline Phosphatase: 80 IU/L (ref 39–117)
BILIRUBIN TOTAL: 0.4 mg/dL (ref 0.0–1.2)
BUN / CREAT RATIO: 13 (ref 12–28)
BUN: 10 mg/dL (ref 8–27)
CALCIUM: 9.3 mg/dL (ref 8.7–10.3)
CHLORIDE: 99 mmol/L (ref 96–106)
CO2: 28 mmol/L (ref 18–29)
Creatinine, Ser: 0.77 mg/dL (ref 0.57–1.00)
GFR, EST AFRICAN AMERICAN: 90 mL/min/{1.73_m2} (ref >59)
GFR, EST NON AFRICAN AMERICAN: 78 mL/min/{1.73_m2} (ref >59)
GLOBULIN, TOTAL: 3 g/dL (ref 1.5–4.5)
Glucose: 84 mg/dL (ref 65–99)
POTASSIUM: 5 mmol/L (ref 3.5–5.2)
SODIUM: 141 mmol/L (ref 134–144)
TOTAL PROTEIN: 7.4 g/dL (ref 6.0–8.5)

## 2016-03-15 LAB — HEMOGLOBIN A1C
Est. average glucose Bld gHb Est-mCnc: 134 mg/dL
Hgb A1c MFr Bld: 6.3 % — ABNORMAL HIGH (ref 4.8–5.6)

## 2016-03-15 LAB — LIPID PANEL
CHOLESTEROL TOTAL: 185 mg/dL (ref 100–199)
Chol/HDL Ratio: 4.2 ratio units (ref 0.0–4.4)
HDL: 44 mg/dL (ref >39)
LDL Calculated: 129 mg/dL — ABNORMAL HIGH (ref 0–99)
TRIGLYCERIDES: 58 mg/dL (ref 0–149)
VLDL Cholesterol Cal: 12 mg/dL (ref 5–40)

## 2016-03-15 LAB — TSH: TSH: 2.69 u[IU]/mL (ref 0.450–4.500)

## 2016-05-07 ENCOUNTER — Encounter: Payer: Self-pay | Admitting: Family Medicine

## 2016-05-07 ENCOUNTER — Ambulatory Visit (INDEPENDENT_AMBULATORY_CARE_PROVIDER_SITE_OTHER): Payer: Commercial Managed Care - HMO | Admitting: Family Medicine

## 2016-05-07 VITALS — BP 124/86 | HR 89 | Temp 98.2°F | Resp 14 | Ht 62.0 in | Wt 132.4 lb

## 2016-05-07 DIAGNOSIS — F330 Major depressive disorder, recurrent, mild: Secondary | ICD-10-CM

## 2016-05-07 DIAGNOSIS — G4700 Insomnia, unspecified: Secondary | ICD-10-CM

## 2016-05-07 DIAGNOSIS — R634 Abnormal weight loss: Secondary | ICD-10-CM | POA: Diagnosis not present

## 2016-05-07 MED ORDER — MIRTAZAPINE 15 MG PO TABS: 15.0000 mg | ORAL_TABLET | Freq: Every day | ORAL | Status: DC

## 2016-05-07 NOTE — Progress Notes (Signed)
Name: Maureen [name]   MRN: [id number]    DOB: 1945/12/21   Date:05/07/2016       Progress Note  Subjective  Chief Complaint  Chief Complaint  Patient presents with  . Stress    Patient states for the past 2 weeks she has been stressed with her mind constantly running in a negative direction, no appetite, crying spells, not as happy as she usually is and has lost 5 pound since last visit. Would like to discuss therapy.     HPI  Major Depression recurrent: she has a history of similar symptoms in the past and never took SSRI, but symptoms improved Remeron. It helped fall and stay asleep and also improved her appetite. She has anhedonia, difficulty sleeping, worrying, crying spells. She does not want to see a counselor or take SSRI at this time. She denies suicidal thoughts or ideation. She states her family has noticed that she is more quiet than usual and withdrawn. He has lost 5 lbs, because of poor appetite, she is still taking care of personal hygiene.    Patient Active Problem List   Diagnosis Date Noted  . Effusion of left knee 07/30/2015  . Allergic rhinitis, seasonal 06/29/2015  . Clavus 06/29/2015  . 1St degree AV block 06/29/2015  . Hammer toe 06/29/2015  . H/O: HTN (hypertension) 06/29/2015  . Blood glucose elevated 06/29/2015  . Floater, vitreous 06/29/2015  . Bilateral tinnitus 06/29/2015  . Hypothyroidism (acquired) 04/02/2015  . Gastroesophageal reflux disease 04/02/2015  . Hammer toe of right foot 04/02/2015  . Scoliosis of thoracic spine 04/02/2015  . Urethral prolapse 04/02/2015  . Corn of toe 04/02/2015  . Hyperlipidemia 04/02/2015  . Varicose veins of both lower extremities 04/02/2015    Past Surgical History  Procedure Laterality Date  . Abdominal hysterectomy  1989  . Tumor excision  1989    same time as hysterectomy  . Breast surgery      Several  . Appendectomy  1989  . Breast excisional biopsy Bilateral     multiple biopsies negative    Family  History  Problem Relation Age of Onset  . Diabetes Father 42    DM complications  . Hypertension Mother 75    CKD  . Thyroid disease Mother   . Heart failure Mother   . Kidney disease Mother   . Kidney disease    . Cancer Sister     Breast  . Breast cancer Sister 15  . Breast cancer Maternal Aunt   . Breast cancer Paternal Aunt     Social History   Social History  . Marital Status: Divorced    Spouse Name: N/A  . Number of Children: N/A  . Years of Education: N/A   Occupational History  . Not on file.   Social History Main Topics  . Smoking status: Never Smoker   . Smokeless tobacco: Never Used  . Alcohol Use: 0.0 oz/week    0 Standard drinks or equivalent per week     Comment: occasional wine  . Drug Use: No  . Sexual Activity: Not Currently   Other Topics Concern  . Not on file   Social History Narrative     Current outpatient prescriptions:  .  b complex vitamins capsule, Take 1 capsule by mouth daily., Disp: 30 capsule, Rfl: 0 .  calcium-vitamin D (OSCAL WITH D) 500-200 MG-UNIT per tablet, Take 1 tablet by mouth daily., Disp: , Rfl:  .  fluticasone (FLONASE) 50 MCG/ACT nasal  spray, Place 1 spray into both nostrils daily., Disp: 16 g, Rfl: 2 .  Garlic Oil 1000 MG CAPS, Take 1 capsule by mouth daily., Disp: , Rfl:  .  glucose-Vitamin C 4-0.006 GM CHEW chewable tablet, Chew 1 tablet by mouth 1 day or 1 dose., Disp: , Rfl:  .  levothyroxine (SYNTHROID, LEVOTHROID) 75 MCG tablet, Take 1 tablet (75 mcg total) by mouth daily., Disp: 30 tablet, Rfl: 2 .  ranitidine (ZANTAC) 300 MG tablet, TAKE ONE TABLET BY MOUTH TWICE DAILY* TRY TAKING OMEPRAZOLE EVERY OTHER DAY*, Disp: 60 tablet, Rfl: 5  Allergies  Allergen Reactions  . Aspirin     Tinnitus  . Citalopram Other (See Comments)  . Codeine Nausea And Vomiting  . Penicillins Rash     ROS  Ten systems reviewed and is negative except as mentioned in HPI   Objective  Filed Vitals:   05/07/16 1204  BP:  124/86  Pulse: 89  Temp: 98.2 F (36.8 C)  TempSrc: Oral  Resp: 14  Height:  (1.575 m)  Weight: 132 lb 6.4 oz (60.056 kg)  SpO2: 94%    Body mass index is 24.21 kg/(m^2).  Physical Exam  Constitutional: Patient appears well-developed and well-nourished.  No distress.  HEENT: head atraumatic, normocephalic, pupils equal and reactive to light,  neck supple, throat within normal limits Cardiovascular: Normal rate, regular rhythm and normal heart sounds.  No murmur heard. No BLE edema. Pulmonary/Chest: Effort normal and breath sounds normal. No respiratory distress. Abdominal: Soft.  There is no tenderness. Psychiatric: Patient has a normal mood and affect. behavior is normal. Judgment and thought content normal.  Recent Results (from the past 2160 hour(s))  Lipid panel     Status: Abnormal   Collection Time: 03/14/16  9:38 AM  Result Value Ref Range   Cholesterol, Total 185 100 - 199 mg/dL   Triglycerides 58 0 - 149 mg/dL   HDL 44 >46 mg/dL   VLDL Cholesterol Cal 12 5 - 40 mg/dL   LDL Calculated 962 (H) 0 - 99 mg/dL   Chol/HDL Ratio 4.2 0.0 - 4.4 ratio units    Comment:                                   T. Chol/HDL Ratio                                             Men  Women                               1/2 Avg.Risk  3.4    3.3                                   Avg.Risk  5.0    4.4                                2X Avg.Risk  9.6    7.1  3X Avg.Risk 23.4   11.0   Comprehensive metabolic panel     Status: None   Collection Time: 03/14/16  9:38 AM  Result Value Ref Range   Glucose 84 65 - 99 mg/dL   BUN 10 8 - 27 mg/dL   Creatinine, Ser 1.610.77 0.57 - 1.00 mg/dL   GFR calc non Af Amer 78 >59 mL/min/1.73   GFR calc Af Amer 90 >59 mL/min/1.73   BUN/Creatinine Ratio 13 12 - 28   Sodium 141 134 - 144 mmol/L   Potassium 5.0 3.5 - 5.2 mmol/L   Chloride 99 96 - 106 mmol/L   CO2 28 18 - 29 mmol/L   Calcium 9.3 8.7 - 10.3 mg/dL   Total Protein  7.4 6.0 - 8.5 g/dL   Albumin 4.4 3.5 - 4.8 g/dL   Globulin, Total 3.0 1.5 - 4.5 g/dL   Albumin/Globulin Ratio 1.5 1.2 - 2.2   Bilirubin Total 0.4 0.0 - 1.2 mg/dL   Alkaline Phosphatase 80 39 - 117 IU/L   AST 25 0 - 40 IU/L   ALT 15 0 - 32 IU/L  Hemoglobin A1c     Status: Abnormal   Collection Time: 03/14/16  9:38 AM  Result Value Ref Range   Hgb A1c MFr Bld 6.3 (H) 4.8 - 5.6 %    Comment:          Pre-diabetes: 5.7 - 6.4          Diabetes: >6.4          Glycemic control for adults with diabetes: <7.0    Est. average glucose Bld gHb Est-mCnc 134 mg/dL  TSH     Status: None   Collection Time: 03/14/16  9:38 AM  Result Value Ref Range   TSH 2.690 0.450 - 4.500 uIU/mL      PHQ2/9: Depression screen [facility] 2/9 05/07/2016 02/13/2016 11/09/2015 07/30/2015 04/02/2015  Decreased Interest 3 0 0 0 0  Down, Depressed, Hopeless 3 0 0 0 0  PHQ - 2 Score 6 0 0 0 0  Altered sleeping 2 - - - -  Tired, decreased energy 2 - - - -  Change in appetite 3 - - - -  Feeling bad or failure about yourself  1 - - - -  Trouble concentrating 2 - - - -  Moving slowly or fidgety/restless 0 - - - -  Suicidal thoughts 0 - - - -  PHQ-9 Score 16 - - - -  Difficult doing work/chores Somewhat difficult - - - -     Fall Risk: Fall Risk  05/07/2016 02/13/2016 11/09/2015 07/30/2015 04/02/2015  Falls in the past year? No No No No No      Functional Status Survey: Is the patient deaf or have difficulty hearing?: No Does the patient have difficulty seeing, even when wearing glasses/contacts?: No Does the patient have difficulty concentrating, remembering, or making decisions?: No Does the patient have difficulty walking or climbing stairs?: No Does the patient have difficulty dressing or bathing?: No Does the patient have difficulty doing errands alone such as visiting a doctor's office or shopping?: No    Assessment & Plan  1. Depression, major, recurrent, mild (HCC)  - mirtazapine (REMERON) 15 MG tablet; Take  1-2 tablets (15-30 mg total) by mouth at bedtime.  Dispense: 60 tablet; Refill: 0  2. Insomnia  - mirtazapine (REMERON) 15 MG tablet; Take 1-2 tablets (15-30 mg total) by mouth at bedtime.  Dispense: 60 tablet; Refill: 0  3. Weight  loss  Resume protein shakes - mirtazapine (REMERON) 15 MG tablet; Take 1-2 tablets (15-30 mg total) by mouth at bedtime.  Dispense: 60 tablet; Refill: 0

## 2016-05-14 ENCOUNTER — Ambulatory Visit: Payer: Commercial Managed Care - HMO | Admitting: Family Medicine

## 2016-05-16 ENCOUNTER — Other Ambulatory Visit: Payer: Self-pay | Admitting: Family Medicine

## 2016-05-20 ENCOUNTER — Telehealth: Payer: Self-pay | Admitting: Family Medicine

## 2016-05-20 DIAGNOSIS — H02849 Edema of unspecified eye, unspecified eyelid: Secondary | ICD-10-CM | POA: Diagnosis not present

## 2016-05-20 NOTE — Telephone Encounter (Signed)
Referral has been placed in [facility] [employer] pending approval, called [facility] and informed them of the authorization number.

## 2016-05-31 ENCOUNTER — Emergency Department: Payer: Commercial Managed Care - HMO

## 2016-05-31 ENCOUNTER — Emergency Department
Admission: EM | Admit: 2016-05-31 | Discharge: 2016-05-31 | Disposition: A | Discharge status: Home / Self Care | Payer: Commercial Managed Care - HMO | Attending: Student | Admitting: Student

## 2016-05-31 ENCOUNTER — Encounter: Payer: Self-pay | Admitting: Emergency Medicine

## 2016-05-31 DIAGNOSIS — E039 Hypothyroidism, unspecified: Secondary | ICD-10-CM | POA: Insufficient documentation

## 2016-05-31 DIAGNOSIS — R0989 Other specified symptoms and signs involving the circulatory and respiratory systems: Secondary | ICD-10-CM | POA: Insufficient documentation

## 2016-05-31 DIAGNOSIS — I10 Essential (primary) hypertension: Secondary | ICD-10-CM | POA: Insufficient documentation

## 2016-05-31 DIAGNOSIS — Z79899 Other long term (current) drug therapy: Secondary | ICD-10-CM | POA: Insufficient documentation

## 2016-05-31 DIAGNOSIS — S1015XA Superficial foreign body of throat, initial encounter: Secondary | ICD-10-CM | POA: Diagnosis not present

## 2016-05-31 DIAGNOSIS — J029 Acute pharyngitis, unspecified: Secondary | ICD-10-CM | POA: Diagnosis present

## 2016-05-31 LAB — CBC
HCT: 39.2 % (ref 35.0–47.0)
HEMOGLOBIN: 13.2 g/dL (ref 12.0–16.0)
MCH: 30.1 pg (ref 26.0–34.0)
MCHC: 33.8 g/dL (ref 32.0–36.0)
MCV: 89.1 fL (ref 80.0–100.0)
PLATELETS: 206 10*3/uL (ref 150–440)
RBC: 4.4 MIL/uL (ref 3.80–5.20)
RDW: 14.6 % — ABNORMAL HIGH (ref 11.5–14.5)
WBC: 4.3 10*3/uL (ref 3.6–11.0)

## 2016-05-31 LAB — TROPONIN I: Troponin I: <0.03 ng/mL (ref <0.03)

## 2016-05-31 LAB — COMPREHENSIVE METABOLIC PANEL
ALK PHOS: 71 U/L (ref 38–126)
ALT: 16 U/L (ref 14–54)
ANION GAP: 13 (ref 5–15)
AST: 25 U/L (ref 15–41)
Albumin: 4.4 g/dL (ref 3.5–5.0)
BILIRUBIN TOTAL: 0.8 mg/dL (ref 0.3–1.2)
BUN: 9 mg/dL (ref 6–20)
CALCIUM: 9.5 mg/dL (ref 8.9–10.3)
CO2: 23 mmol/L (ref 22–32)
Chloride: 102 mmol/L (ref 101–111)
Creatinine, Ser: 0.71 mg/dL (ref 0.44–1.00)
GLUCOSE: 102 mg/dL — AB (ref 65–99)
Potassium: 3.5 mmol/L (ref 3.5–5.1)
Sodium: 138 mmol/L (ref 135–145)
TOTAL PROTEIN: 7.9 g/dL (ref 6.5–8.1)

## 2016-05-31 MED ORDER — GI COCKTAIL ~~LOC~~
30.0000 mL | Freq: Once | ORAL | Status: AC
  Administered 2016-05-31: 30 mL via ORAL
  Filled 2016-05-31: qty 30

## 2016-05-31 NOTE — ED Triage Notes (Signed)
Patient presents to the ED stating, "I feel like there's something caught in my throat."  Patient states she has felt something in her throat since 7:45pm  Yesterday.  Patient reports feeling it when she swallows.  Patient denies pain.  Patient reports history of acid reflux but this feels different.  Patient states the feeling is only on the left side of her throat.  Patient is speaking in full sentences without difficulty.  Patient is in no obvious distress at this time.  Ambulatory to triage.

## 2016-05-31 NOTE — ED Notes (Signed)
NAD noted at this time. Pt resting in bed, pt's daughter at bedside. Denies any needs at this time. Delay

## 2016-05-31 NOTE — ED Notes (Signed)
Pt taken to Xray at this time.

## 2016-05-31 NOTE — ED Provider Notes (Signed)
[facility] Emergency Department Provider Note   ____________________________________________   None    (approximate)  I have reviewed the triage vital signs and the nursing notes.   HISTORY  Chief Complaint Sore Throat    HPI Maureen [name] is a 70 y.o. female history of hyperlipidemia, thyroid disease, GERD who presents for evaluation of foreign body sensation in the throat that began last night at approximately 8 PM, constant since onset, improved, initially moderate. Patient reports that she was eating a tuna fish sandwich at approximately 5 PM yesterday evening. She reports that it was canned tuna and she made the sandwich herself. She does not see any bones. She felt fine between the hours of 5pm and 8 PM. At 8 PM she developed a sensation that there was something "stuck" in the left side of her throat. She was able to drink but she hasn't had anything to eat since 5 PM yesterday. She denies any chest pain or difficulty breathing, no vomiting, diarrhea, fevers or chills, she is otherwise been in her usual state of health.   Past Medical History:  Diagnosis Date  . Allergy   . Corn of toe   . First degree AV block   . GERD (gastroesophageal reflux disease)   . Hammer toe of right foot   . Hyperglycemia   . Hyperlipidemia   . Hypertension   . Loss of appetite   . Prolapse of urethra   . Scoliosis (and kyphoscoliosis), idiopathic   . Thyroid disease   . Tinnitus of both ears   . Vitreous floaters of right eye     Patient Active Problem List   Diagnosis Date Noted  . Effusion of left knee 07/30/2015  . Allergic rhinitis, seasonal 06/29/2015  . Clavus 06/29/2015  . 1St degree AV block 06/29/2015  . Hammer toe 06/29/2015  . H/O: HTN (hypertension) 06/29/2015  . Blood glucose elevated 06/29/2015  . Floater, vitreous 06/29/2015  . Bilateral tinnitus 06/29/2015  . Hypothyroidism (acquired) 04/02/2015  . Gastroesophageal reflux disease  04/02/2015  . Hammer toe of right foot 04/02/2015  . Scoliosis of thoracic spine 04/02/2015  . Urethral prolapse 04/02/2015  . Corn of toe 04/02/2015  . Hyperlipidemia 04/02/2015  . Varicose veins of both lower extremities 04/02/2015    Past Surgical History:  Procedure Laterality Date  . ABDOMINAL HYSTERECTOMY  1989  . APPENDECTOMY  1989  . BREAST EXCISIONAL BIOPSY Bilateral    multiple biopsies negative  . BREAST SURGERY     Several  . TUMOR EXCISION  1989   same time as hysterectomy    Prior to Admission medications   Medication Sig Start Date End Date Taking? Authorizing Provider  b complex vitamins capsule Take 1 capsule by mouth daily. Patient taking differently: Take 1 capsule by mouth daily as needed. For vitamin B deficiency. 11/09/15  Yes Alba Cory, MD  fluticasone (FLONASE) 50 MCG/ACT nasal spray Place 1 spray into both nostrils daily. Patient taking differently: Place 1 spray into both nostrils daily as needed for allergies or rhinitis.  11/09/15  Yes Alba Cory, MD  Garlic Oil 1000 MG CAPS Take 1,000 mg by mouth daily.    Yes Historical Provider, MD  levothyroxine (SYNTHROID, LEVOTHROID) 75 MCG tablet TAKE ONE TABLET BY MOUTH ONCE DAILY 05/16/16  Yes Alba Cory, MD  mirtazapine (REMERON) 15 MG tablet Take 1-2 tablets (15-30 mg total) by mouth at bedtime. 05/07/16  Yes Alba Cory, MD  ranitidine (ZANTAC) 300 MG tablet  TAKE ONE TABLET BY MOUTH TWICE DAILY* TRY TAKING OMEPRAZOLE EVERY OTHER DAY* 01/18/16  Yes Alba Cory, MD    Allergies Aspirin; Citalopram; Codeine; and Penicillins  Family History  Problem Relation Age of Onset  . Diabetes Father 37    DM complications  . Hypertension Mother 92    CKD  . Thyroid disease Mother   . Heart failure Mother   . Kidney disease Mother   . Cancer Sister     Breast  . Breast cancer Sister 54  . Kidney disease    . Breast cancer Maternal Aunt   . Breast cancer Paternal Aunt     Social  History Social History  Substance Use Topics  . Smoking status: Never Smoker  . Smokeless tobacco: Never Used  . Alcohol use 0.0 oz/week     Comment: occasional wine    Review of Systems Constitutional: No fever/chills Eyes: No visual changes. ENT: No sore throat. Cardiovascular: Denies chest pain. Respiratory: Denies shortness of breath. Gastrointestinal: No abdominal pain.  No nausea, no vomiting.  No diarrhea.  No constipation. Genitourinary: Negative for dysuria. Musculoskeletal: Negative for back pain. Skin: Negative for rash. Neurological: Negative for headaches, focal weakness or numbness.  10-point ROS otherwise negative.  ____________________________________________   PHYSICAL EXAM:  Vitals:   05/31/16 0813 05/31/16 0930 05/31/16 1130 05/31/16 1200  BP: (!) 121/99  (!) 154/98 (!) 151/99  Pulse: 88 93 98 91  Resp: Temp: 98.2 F (36.8 C)     TempSrc: Oral     SpO2: 100% 100% 98% 98%  Weight: 132 lb 9.6 oz (60.1 kg)     Height:  (1.6 m)       VITAL SIGNS: ED Triage Vitals [05/31/16 0813]  Enc Vitals Group     BP (!) 121/99     Pulse Rate 88     Resp 18     Temp 98.2 F (36.8 C)     Temp Source Oral     SpO2 100 %     Weight 132 lb 9.6 oz (60.1 kg)     Height  (1.6 m)     Head Circumference      Peak Flow      Pain Score      Pain Loc      Pain Edu?      Excl. in GC?     Constitutional: Alert and oriented. Well appearing and in no acute distress. Eyes: Conjunctivae are normal. PERRL. EOMI. Head: Atraumatic. Nose: No congestion/rhinnorhea. Mouth/Throat: Mucous membranes are moist.  Oropharynx non-erythematous and nonedematous. Neck: No stridor. No tenderness or crepitus throughout the anterior neck. Cardiovascular: Normal rate, regular rhythm. Grossly normal heart sounds.  Good peripheral circulation. Respiratory: Normal respiratory effort.  No retractions. Lungs CTAB. Gastrointestinal: Soft and nontender. No distention.   No CVA tenderness. Genitourinary: deferred Musculoskeletal: No lower extremity tenderness nor edema.  No joint effusions. Neurologic:  Normal speech and language. No gross focal neurologic deficits are appreciated. No gait instability. Skin:  Skin is warm, dry and intact. No rash noted. Psychiatric: Mood and affect are normal. Speech and behavior are normal.  ____________________________________________   LABS (all labs ordered are listed, but only abnormal results are displayed)  Labs Reviewed  CBC - Abnormal; Notable for the following:       Result Value   RDW 14.6 (*)    All other components within normal limits  COMPREHENSIVE METABOLIC PANEL - Abnormal; Notable for  the following:    Glucose, Bld 102 (*)    All other components within normal limits  TROPONIN I   ____________________________________________  EKG  ED ECG REPORT I, Gayla Doss, the attending physician, personally viewed and interpreted this ECG.   Date: 05/31/2016  EKG Time: 08:27  Rate: 90  Rhythm: sinus rhythm with first-degree AV block.  Axis: normal  Intervals:first-degree A-V block   ST&T Change: No acute ST elevation or acute ST depression. Q waves in  V2, V3.  ____________________________________________  RADIOLOGY  Xray soft-tissue neck IMPRESSION: No radiodense foreign body in the hypopharynx or larynx.  CXR IMPRESSION: No radiographic evidence of acute cardiopulmonary disease.  No radiopaque foreign body identified. ____________________________________________   PROCEDURES  Procedure(s) performed: None  Procedures  Critical Care performed: No  ____________________________________________   INITIAL IMPRESSION / ASSESSMENT AND PLAN / ED COURSE  Pertinent labs & imaging results that were available during my care of the patient were reviewed by me and considered in my medical decision making (see chart for details).  Maureen [name] is a 70 y.o. female history of  hyperlipidemia, thyroid disease, GERD who presents for evaluation of foreign body sensation in the throat that began last night at approximately 8 PM. On exam, she is very well-appearing and in no acute distress. Vital signs stable, she is afebrile. She has benign physical examination. She is speaking with a normal voice, not drooling, handling secretions well and is able to drink. We'll obtain screening labs as well as plain films to evaluate for any radiopaque foreign body. I doubt that this represents an esophageal food bolus/impaction.  ----------------------------------------- 12:55 PM on 05/31/2016 ----------------------------------------- EKG not consistent with acute ischemia, negative troponin. CBC and CMP unremarkable. Patient feels better after GI cocktail. Plain films are negative for any radiopaque foreign body. She is sitting up in bed drinking water and eating crackers. Discussed that this is likely a foreign body sensation as opposed to an actual foreign body. We discussed meticulous return precautions, need for close PCP follow-up and she is comfortable with the discharge plan. DC home.  Clinical Course     ____________________________________________   FINAL CLINICAL IMPRESSION(S) / ED DIAGNOSES  Final diagnoses:  Foreign body sensation in throat      NEW MEDICATIONS STARTED DURING THIS VISIT:  New Prescriptions   No medications on file     Note:  This document was prepared using Dragon voice recognition software and may include unintentional dictation errors.    Gayla Doss, MD 05/31/16 1300

## 2016-06-02 ENCOUNTER — Encounter: Payer: Self-pay | Admitting: Family Medicine

## 2016-06-02 ENCOUNTER — Telehealth: Payer: Self-pay | Admitting: Family Medicine

## 2016-06-02 ENCOUNTER — Ambulatory Visit (INDEPENDENT_AMBULATORY_CARE_PROVIDER_SITE_OTHER): Payer: Commercial Managed Care - HMO | Admitting: Family Medicine

## 2016-06-02 VITALS — BP 124/78 | HR 89 | Temp 98.7°F | Resp 18 | Ht 62.0 in | Wt 131.4 lb

## 2016-06-02 DIAGNOSIS — F330 Major depressive disorder, recurrent, mild: Secondary | ICD-10-CM | POA: Diagnosis not present

## 2016-06-02 DIAGNOSIS — R739 Hyperglycemia, unspecified: Secondary | ICD-10-CM | POA: Diagnosis not present

## 2016-06-02 DIAGNOSIS — F411 Generalized anxiety disorder: Secondary | ICD-10-CM | POA: Diagnosis not present

## 2016-06-02 DIAGNOSIS — G4700 Insomnia, unspecified: Secondary | ICD-10-CM | POA: Diagnosis not present

## 2016-06-02 DIAGNOSIS — K219 Gastro-esophageal reflux disease without esophagitis: Secondary | ICD-10-CM

## 2016-06-02 DIAGNOSIS — R634 Abnormal weight loss: Secondary | ICD-10-CM | POA: Diagnosis not present

## 2016-06-02 DIAGNOSIS — E039 Hypothyroidism, unspecified: Secondary | ICD-10-CM | POA: Diagnosis not present

## 2016-06-02 MED ORDER — MIRTAZAPINE 15 MG PO TABS: 15.0000 mg | ORAL_TABLET | Freq: Every day | ORAL | 5 refills | Status: DC

## 2016-06-02 MED ORDER — RANITIDINE HCL 300 MG PO TABS: 300.0000 mg | ORAL_TABLET | Freq: Two times a day (BID) | ORAL | 5 refills | Status: DC

## 2016-06-02 MED ORDER — LEVOTHYROXINE SODIUM 75 MCG PO TABS: 75.0000 ug | ORAL_TABLET | Freq: Every day | ORAL | 2 refills | Status: DC

## 2016-06-02 MED ORDER — SERTRALINE HCL 50 MG PO TABS: 50.0000 mg | ORAL_TABLET | Freq: Every day | ORAL | 0 refills | Status: DC

## 2016-06-02 NOTE — Telephone Encounter (Signed)
Spoke to patient and advised Maalox

## 2016-06-02 NOTE — Progress Notes (Signed)
Name: Maureen [name]   MRN: [id number]    DOB: 03/06/1946   Date:06/02/2016       Progress Note  Subjective  Chief Complaint  Chief Complaint  Patient presents with  . Anxiety    pt states that she would like to be placed medications previously discussed with you  . Gastroesophageal Reflux    HPI  Major Depression recurrent/GAD: she has a history of similar symptoms in the past and took Citalopram once - but had a reaction - not sure the type, but symptoms improved Remeron. It helped her  fall and stay asleep and also improved her appetite. She has anhedonia,  Worrying, occasional crying spells. She does not want to see a counselor but is willing to try  SSRI at this time. She denies suicidal thoughts or ideation. She states her family has noticed that she is more quiet than usual and withdrawn. He has lost 6 lbs, because of poor appetite, she is still taking care of personal hygiene.  GERD: she went to [facility] this past weekend with discomfort on the left side of her throat after she ate a home made tuna sandwich. She had soft tissue US of neck and also CXR that were negative, she was given a GI cocktail and symptoms have improved. She has been eating with no problems now. She is taking Ranitidine once a day, advised to increase to twice daily for the next few days. Call back if symptoms returns.  Hypothyroidism: losing weight, last TSH was normal less than 3 months ago, no palpitation, no diarrhea.   Patient Active Problem List   Diagnosis Date Noted  . Effusion of left knee 07/30/2015  . Allergic rhinitis, seasonal 06/29/2015  . Clavus 06/29/2015  . 1St degree AV block 06/29/2015  . Hammer toe 06/29/2015  . H/O: HTN (hypertension) 06/29/2015  . Blood glucose elevated 06/29/2015  . Floater, vitreous 06/29/2015  . Bilateral tinnitus 06/29/2015  . Hypothyroidism (acquired) 04/02/2015  . Gastroesophageal reflux disease 04/02/2015  . Hammer toe of right foot 04/02/2015  . Scoliosis of  thoracic spine 04/02/2015  . Urethral prolapse 04/02/2015  . Corn of toe 04/02/2015  . Hyperlipidemia 04/02/2015  . Varicose veins of both lower extremities 04/02/2015    Past Surgical History:  Procedure Laterality Date  . ABDOMINAL HYSTERECTOMY  1989  . APPENDECTOMY  1989  . BREAST EXCISIONAL BIOPSY Bilateral    multiple biopsies negative  . BREAST SURGERY     Several  . TUMOR EXCISION  1989   same time as hysterectomy    Family History  Problem Relation Age of Onset  . Diabetes Father 94    DM complications  . Hypertension Mother 32    CKD  . Thyroid disease Mother   . Heart failure Mother   . Kidney disease Mother   . Cancer Sister     Breast  . Breast cancer Sister 4  . Kidney disease    . Breast cancer Maternal Aunt   . Breast cancer Paternal Aunt     Social History   Social History  . Marital status: Divorced    Spouse name: N/A  . Number of children: N/A  . Years of education: N/A   Occupational History  . Not on file.   Social History Main Topics  . Smoking status: Never Smoker  . Smokeless tobacco: Never Used  . Alcohol use 0.0 oz/week     Comment: occasional wine  . Drug use: No  . Sexual  activity: Not Currently   Other Topics Concern  . Not on file   Social History Narrative  . No narrative on file     Current Outpatient Prescriptions:  .  b complex vitamins capsule, Take 1 capsule by mouth daily. (Patient taking differently: Take 1 capsule by mouth daily as needed. For vitamin B deficiency.), Disp: 30 capsule, Rfl: 0 .  fluticasone (FLONASE) 50 MCG/ACT nasal spray, Place 1 spray into both nostrils daily. (Patient taking differently: Place 1 spray into both nostrils daily as needed for allergies or rhinitis. ), Disp: 16 g, Rfl: 2 .  Garlic Oil 1610 MG CAPS, Take 1,000 mg by mouth daily. , Disp: , Rfl:  .  levothyroxine (SYNTHROID, LEVOTHROID) 75 MCG tablet, Take 1 tablet (75 mcg total) by mouth daily., Disp: 30 tablet, Rfl: 2 .   mirtazapine (REMERON) 15 MG tablet, Take 1-2 tablets (15-30 mg total) by mouth at bedtime., Disp: 60 tablet, Rfl: 5 .  ranitidine (ZANTAC) 300 MG tablet, Take 1 tablet (300 mg total) by mouth 2 (two) times daily., Disp: 60 tablet, Rfl: 5 .  sertraline (ZOLOFT) 50 MG tablet, Take 1 tablet (50 mg total) by mouth daily., Disp: 30 tablet, Rfl: 0  Allergies  Allergen Reactions  . Aspirin     Tinnitus  . Citalopram Other (See Comments)  . Codeine Nausea And Vomiting  . Penicillins Rash    Has patient had a PCN reaction causing immediate rash, facial/tongue/throat swelling, SOB or lightheadedness with hypotension: Yes Has patient had a PCN reaction causing severe rash involving mucus membranes or skin necrosis: No Has patient had a PCN reaction that required hospitalization No Has patient had a PCN reaction occurring within the last 10 years: No If all of the above answers are "NO", then may proceed with Cephalosporin use.     ROS  Ten systems reviewed and is negative except as mentioned in HPI   Objective  Vitals:   06/02/16 1040  BP: 124/78  Pulse: 89  Resp: 18  Temp: 98.7 F (37.1 C)  SpO2: 97%  Weight: 131 lb 6 oz (59.6 kg)  Height: 5' 2"  (1.575 m)    Body mass index is 24.03 kg/m.  Physical Exam  Constitutional: Patient appears well-developed and well-nourished. No distress.  HEENT: head atraumatic, normocephalic, pupils equal and reactive to light, neck supple, throat within normal limits Cardiovascular: Normal rate, regular rhythm and normal heart sounds.  No murmur heard. No BLE edema. Pulmonary/Chest: Effort normal and breath sounds normal. No respiratory distress. Abdominal: Soft.  There is no tenderness. Psychiatric: Patient has a normal mood and affect. behavior is normal. Judgment and thought content normal.  Recent Results (from the past 2160 hour(s))  Lipid panel     Status: Abnormal   Collection Time: 03/14/16  9:38 AM  Result Value Ref Range    Cholesterol, Total 185 100 - 199 mg/dL   Triglycerides 58 0 - 149 mg/dL   HDL 44 >39 mg/dL   VLDL Cholesterol Cal 12 5 - 40 mg/dL   LDL Calculated 129 (H) 0 - 99 mg/dL   Chol/HDL Ratio 4.2 0.0 - 4.4 ratio units    Comment:                                   T. Chol/HDL Ratio  Men  Women                               1/2 Avg.Risk  3.4    3.3                                   Avg.Risk  5.0    4.4                                2X Avg.Risk  9.6    7.1                                3X Avg.Risk 23.4   11.0   Comprehensive metabolic panel     Status: None   Collection Time: 03/14/16  9:38 AM  Result Value Ref Range   Glucose 84 65 - 99 mg/dL   BUN 10 8 - 27 mg/dL   Creatinine, Ser 0.77 0.57 - 1.00 mg/dL   GFR calc non Af Amer 78 >59 mL/min/1.73   GFR calc Af Amer 90 >59 mL/min/1.73   BUN/Creatinine Ratio 13 12 - 28   Sodium 141 134 - 144 mmol/L   Potassium 5.0 3.5 - 5.2 mmol/L   Chloride 99 96 - 106 mmol/L   CO2 28 18 - 29 mmol/L   Calcium 9.3 8.7 - 10.3 mg/dL   Total Protein 7.4 6.0 - 8.5 g/dL   Albumin 4.4 3.5 - 4.8 g/dL   Globulin, Total 3.0 1.5 - 4.5 g/dL   Albumin/Globulin Ratio 1.5 1.2 - 2.2   Bilirubin Total 0.4 0.0 - 1.2 mg/dL   Alkaline Phosphatase 80 39 - 117 IU/L   AST 25 0 - 40 IU/L   ALT 15 0 - 32 IU/L  Hemoglobin A1c     Status: Abnormal   Collection Time: 03/14/16  9:38 AM  Result Value Ref Range   Hgb A1c MFr Bld 6.3 (H) 4.8 - 5.6 %    Comment:          Pre-diabetes: 5.7 - 6.4          Diabetes: >6.4          Glycemic control for adults with diabetes: <7.0    Est. average glucose Bld gHb Est-mCnc 134 mg/dL  TSH     Status: None   Collection Time: 03/14/16  9:38 AM  Result Value Ref Range   TSH 2.690 0.450 - 4.500 uIU/mL  CBC     Status: Abnormal   Collection Time: 05/31/16  8:29 AM  Result Value Ref Range   WBC 4.3 3.6 - 11.0 K/uL   RBC 4.40 3.80 - 5.20 MIL/uL   Hemoglobin 13.2 12.0 - 16.0 g/dL   HCT 39.2 35.0  - 47.0 %   MCV 89.1 80.0 - 100.0 fL   MCH 30.1 26.0 - 34.0 pg   MCHC 33.8 32.0 - 36.0 g/dL   RDW 14.6 (H) 11.5 - 14.5 %   Platelets 206 150 - 440 K/uL  Troponin I     Status: None   Collection Time: 05/31/16  8:29 AM  Result Value Ref Range   Troponin I <0.03 <0.03 ng/mL  Comprehensive metabolic panel     Status: Abnormal   Collection Time: 05/31/16  8:29 AM  Result Value Ref Range   Sodium 138 135 - 145 mmol/L   Potassium 3.5 3.5 - 5.1 mmol/L   Chloride 102 101 - 111 mmol/L   CO2 23 22 - 32 mmol/L   Glucose, Bld 102 (H) 65 - 99 mg/dL   BUN 9 6 - 20 mg/dL   Creatinine, Ser 0.71 0.44 - 1.00 mg/dL   Calcium 9.5 8.9 - 10.3 mg/dL   Total Protein 7.9 6.5 - 8.1 g/dL   Albumin 4.4 3.5 - 5.0 g/dL   AST 25 15 - 41 U/L   ALT 16 14 - 54 U/L   Alkaline Phosphatase 71 38 - 126 U/L   Total Bilirubin 0.8 0.3 - 1.2 mg/dL   GFR calc non Af Amer >60 >60 mL/min   GFR calc Af Amer >60 >60 mL/min    Comment: (NOTE) The eGFR has been calculated using the CKD EPI equation. This calculation has not been validated in all clinical situations. eGFR's persistently <60 mL/min signify possible Chronic Kidney Disease.    Anion gap 13 5 - 15     PHQ2/9: Depression screen [facility] 2/9 06/02/2016 05/07/2016 02/13/2016 11/09/2015 07/30/2015  Decreased Interest 0 3 0 0 0  Down, Depressed, Hopeless 1 3 0 0 0  PHQ - 2 Score 1 6 0 0 0  Altered sleeping 1 2 - - -  Tired, decreased energy 0 2 - - -  Change in appetite 0 3 - - -  Feeling bad or failure about yourself  1 1 - - -  Trouble concentrating 0 2 - - -  Moving slowly or fidgety/restless 0 0 - - -  Suicidal thoughts 0 0 - - -  PHQ-9 Score 3 16 - - -  Difficult doing work/chores Not difficult at all Somewhat difficult - - -     Fall Risk: Fall Risk  06/02/2016 05/07/2016 02/13/2016 11/09/2015 07/30/2015  Falls in the past year? No No No No No   GAD 7 : Generalized Anxiety Score 06/02/2016 05/07/2016  Nervous, Anxious, on Edge 3 1  Control/stop worrying 3 1   Worry too much - different things 3 3  Trouble relaxing 0 0  Restless 1 0  Easily annoyed or irritable 3 0  Afraid - awful might happen 3 0  Total GAD 7 Score 16 5  Anxiety Difficulty Very difficult Somewhat difficult     Assessment & Plan  1. Depression, major, recurrent, mild (HCC)  - mirtazapine (REMERON) 15 MG tablet; Take 1-2 tablets (15-30 mg total) by mouth at bedtime.  Dispense: 60 tablet; Refill: 5 - sertraline (ZOLOFT) 50 MG tablet; Take 1 tablet (50 mg total) by mouth daily.  Dispense: 30 tablet; Refill: 0  2. Insomnia  - mirtazapine (REMERON) 15 MG tablet; Take 1-2 tablets (15-30 mg total) by mouth at bedtime.  Dispense: 60 tablet; Refill: 5  3. Weight loss  - mirtazapine (REMERON) 15 MG tablet; Take 1-2 tablets (15-30 mg total) by mouth at bedtime.  Dispense: 60 tablet; Refill: 5  4. Blood glucose elevated   5. Gastroesophageal reflux disease without esophagitis  - ranitidine (ZANTAC) 300 MG tablet; Take 1 tablet (300 mg total) by mouth 2 (two) times daily.  Dispense: 60 tablet; Refill: 5  6. GAD (generalized anxiety disorder)  - sertraline (ZOLOFT) 50 MG tablet; Take 1 tablet (50 mg total) by mouth daily.  Dispense: 30 tablet; Refill: 0  7. Hypothyroidism (acquired)  - levothyroxine (SYNTHROID, LEVOTHROID) 75 MCG tablet; Take 1  tablet (75 mcg total) by mouth daily.  Dispense: 30 tablet; Refill: 2

## 2016-06-09 ENCOUNTER — Ambulatory Visit: Payer: Commercial Managed Care - HMO | Admitting: Family Medicine

## 2016-07-04 DIAGNOSIS — E782 Mixed hyperlipidemia: Secondary | ICD-10-CM | POA: Diagnosis not present

## 2016-07-04 DIAGNOSIS — Z131 Encounter for screening for diabetes mellitus: Secondary | ICD-10-CM | POA: Diagnosis not present

## 2016-07-07 ENCOUNTER — Encounter: Payer: Self-pay | Admitting: Family Medicine

## 2016-07-07 ENCOUNTER — Ambulatory Visit (INDEPENDENT_AMBULATORY_CARE_PROVIDER_SITE_OTHER): Payer: Commercial Managed Care - HMO | Admitting: Family Medicine

## 2016-07-07 VITALS — BP 116/78 | HR 96 | Temp 98.7°F | Resp 16 | Ht 62.0 in | Wt 127.8 lb

## 2016-07-07 DIAGNOSIS — K089 Disorder of teeth and supporting structures, unspecified: Secondary | ICD-10-CM | POA: Diagnosis not present

## 2016-07-07 DIAGNOSIS — F330 Major depressive disorder, recurrent, mild: Secondary | ICD-10-CM | POA: Diagnosis not present

## 2016-07-07 DIAGNOSIS — L309 Dermatitis, unspecified: Secondary | ICD-10-CM

## 2016-07-07 DIAGNOSIS — G4700 Insomnia, unspecified: Secondary | ICD-10-CM | POA: Diagnosis not present

## 2016-07-07 DIAGNOSIS — F411 Generalized anxiety disorder: Secondary | ICD-10-CM | POA: Diagnosis not present

## 2016-07-07 DIAGNOSIS — E039 Hypothyroidism, unspecified: Secondary | ICD-10-CM

## 2016-07-07 DIAGNOSIS — Z23 Encounter for immunization: Secondary | ICD-10-CM | POA: Diagnosis not present

## 2016-07-07 DIAGNOSIS — R634 Abnormal weight loss: Secondary | ICD-10-CM

## 2016-07-07 MED ORDER — TRIAMCINOLONE ACETONIDE 0.1 % EX CREA
1.0000 | TOPICAL_CREAM | Freq: Two times a day (BID) | CUTANEOUS | 0 refills | Status: DC
Start: 2016-07-07 — End: 2019-01-11

## 2016-07-07 NOTE — Progress Notes (Signed)
Name: Maureen [name]   MRN: [id number]    DOB: 11/14/1945   Date:07/07/2016       Progress Note  Subjective  Chief Complaint  Chief Complaint  Patient presents with  . Follow-up    1 month F/U  . GAD    Patient only took 2 doses of Zoloft due to teeth pain and had so much going on, never started taking it. Patient has lost 4 more pounds since last visit due to teeth pain and going to the dentist tomorrow at 11 a.m.  Marland Kitchen Depression    HPI  Major Depression recurrent/GAD: she tried Citalopram in the past but could not tolerated. We tried Zoloft but she only took two pills. She still some has anhedonia, still worries all the time and has occasional crying spells. She does not want to see a counselor and is considering going on Zoloft if symptoms gets worse. She denies suicidal thoughts or ideation. Family no longer saying that she is withdrawn or quiet. She has lost 3 more  lbs, but she thinks it is related to teeth problems/chewing, going to the dentist tomorrow.   GERD: she is taking Ranitidine in am's and symptoms are controlled, no epigastric no regurgitation.  Hypothyroidism: losing weight, last TSH was normal less than 3 months ago, no palpitation, no diarrhea.   Eczema: she has antecubital rash/dryness on both side, using topical lotion and hydrocortisone 10 with improvement of symptoms.   Patient Active Problem List   Diagnosis Date Noted  . Effusion of left knee 07/30/2015  . Allergic rhinitis, seasonal 06/29/2015  . Clavus 06/29/2015  . 1St degree AV block 06/29/2015  . Hammer toe 06/29/2015  . H/O: HTN (hypertension) 06/29/2015  . Blood glucose elevated 06/29/2015  . Floater, vitreous 06/29/2015  . Bilateral tinnitus 06/29/2015  . Hypothyroidism (acquired) 04/02/2015  . Gastroesophageal reflux disease 04/02/2015  . Hammer toe of right foot 04/02/2015  . Scoliosis of thoracic spine 04/02/2015  . Urethral prolapse 04/02/2015  . Corn of toe 04/02/2015  . Hyperlipidemia  04/02/2015  . Varicose veins of both lower extremities 04/02/2015    Past Surgical History:  Procedure Laterality Date  . ABDOMINAL HYSTERECTOMY  1989  . APPENDECTOMY  1989  . BREAST EXCISIONAL BIOPSY Bilateral    multiple biopsies negative  . BREAST SURGERY     Several  . TUMOR EXCISION  1989   same time as hysterectomy    Family History  Problem Relation Age of Onset  . Diabetes Father 10    DM complications  . Hypertension Mother 24    CKD  . Thyroid disease Mother   . Heart failure Mother   . Kidney disease Mother   . Cancer Sister     Breast  . Breast cancer Sister 61  . Kidney disease    . Breast cancer Maternal Aunt   . Breast cancer Paternal Aunt     Social History   Social History  . Marital status: Divorced    Spouse name: N/A  . Number of children: N/A  . Years of education: N/A   Occupational History  . Not on file.   Social History Main Topics  . Smoking status: Never Smoker  . Smokeless tobacco: Never Used  . Alcohol use 0.0 oz/week     Comment: occasional wine  . Drug use: No  . Sexual activity: Not Currently   Other Topics Concern  . Not on file   Social History Narrative  . No narrative  on file     Current Outpatient Prescriptions:  .  b complex vitamins capsule, Take 1 capsule by mouth daily. (Patient taking differently: Take 1 capsule by mouth daily as needed. For vitamin B deficiency.), Disp: 30 capsule, Rfl: 0 .  fluticasone (FLONASE) 50 MCG/ACT nasal spray, Place 1 spray into both nostrils daily. (Patient taking differently: Place 1 spray into both nostrils daily as needed for allergies or rhinitis. ), Disp: 16 g, Rfl: 2 .  Garlic Oil 2637 MG CAPS, Take 1,000 mg by mouth daily. , Disp: , Rfl:  .  levothyroxine (SYNTHROID, LEVOTHROID) 75 MCG tablet, Take 1 tablet (75 mcg total) by mouth daily., Disp: 30 tablet, Rfl: 2 .  ranitidine (ZANTAC) 300 MG tablet, Take 1 tablet (300 mg total) by mouth 2 (two) times daily., Disp: 60 tablet,  Rfl: 5 .  mirtazapine (REMERON) 15 MG tablet, Take 1-2 tablets (15-30 mg total) by mouth at bedtime. (Patient not taking: Reported on 07/07/2016), Disp: 60 tablet, Rfl: 5 .  sertraline (ZOLOFT) 50 MG tablet, Take 1 tablet (50 mg total) by mouth daily. (Patient not taking: Reported on 07/07/2016), Disp: 30 tablet, Rfl: 0  Allergies  Allergen Reactions  . Aspirin     Tinnitus  . Citalopram Other (See Comments)  . Codeine Nausea And Vomiting  . Penicillins Rash    Has patient had a PCN reaction causing immediate rash, facial/tongue/throat swelling, SOB or lightheadedness with hypotension: Yes Has patient had a PCN reaction causing severe rash involving mucus membranes or skin necrosis: No Has patient had a PCN reaction that required hospitalization No Has patient had a PCN reaction occurring within the last 10 years: No If all of the above answers are "NO", then may proceed with Cephalosporin use.     ROS  Constitutional: Negative for fever or weight change.  Respiratory: Negative for cough and shortness of breath.   Cardiovascular: Negative for chest pain or palpitations.  Gastrointestinal: Negative for abdominal pain, no bowel changes.  Musculoskeletal: Negative for gait problem or joint swelling.  Skin: Negative for rash.  Neurological: Negative for dizziness or headache.  No other specific complaints in a complete review of systems (except as listed in HPI above).  Objective  Vitals:   07/07/16 1004  BP: 116/78  Pulse: 96  Resp: 16  Temp: 98.7 F (37.1 C)  TempSrc: Oral  SpO2: 95%  Weight: 127 lb 12.8 oz (58 kg)  Height: 5' 2"  (1.575 m)    Body mass index is 23.37 kg/m.  Physical Exam  Constitutional: Patient appears well-developed and well-nourished. No distress.  HEENT: head atraumatic, normocephalic, pupils equal and reactive to light,  neck supple, throat within normal limits Cardiovascular: Normal rate, regular rhythm and normal heart sounds.  No murmur heard.  No BLE edema. Pulmonary/Chest: Effort normal and breath sounds normal. No respiratory distress. Abdominal: Soft.  There is no tenderness. Psychiatric: Patient has a normal mood and affect. behavior is normal. Judgment and thought content normal. Skin: antecubital rash. No erythema, eczematous patches  Recent Results (from the past 2160 hour(s))  CBC     Status: Abnormal   Collection Time: 05/31/16  8:29 AM  Result Value Ref Range   WBC 4.3 3.6 - 11.0 K/uL   RBC 4.40 3.80 - 5.20 MIL/uL   Hemoglobin 13.2 12.0 - 16.0 g/dL   HCT 39.2 35.0 - 47.0 %   MCV 89.1 80.0 - 100.0 fL   MCH 30.1 26.0 - 34.0 pg   MCHC 33.8 32.0 -  36.0 g/dL   RDW 14.6 (H) 11.5 - 14.5 %   Platelets 206 150 - 440 K/uL  Troponin I     Status: None   Collection Time: 05/31/16  8:29 AM  Result Value Ref Range   Troponin I <0.03 <0.03 ng/mL  Comprehensive metabolic panel     Status: Abnormal   Collection Time: 05/31/16  8:29 AM  Result Value Ref Range   Sodium 138 135 - 145 mmol/L   Potassium 3.5 3.5 - 5.1 mmol/L   Chloride 102 101 - 111 mmol/L   CO2 23 22 - 32 mmol/L   Glucose, Bld 102 (H) 65 - 99 mg/dL   BUN 9 6 - 20 mg/dL   Creatinine, Ser 0.71 0.44 - 1.00 mg/dL   Calcium 9.5 8.9 - 10.3 mg/dL   Total Protein 7.9 6.5 - 8.1 g/dL   Albumin 4.4 3.5 - 5.0 g/dL   AST 25 15 - 41 U/L   ALT 16 14 - 54 U/L   Alkaline Phosphatase 71 38 - 126 U/L   Total Bilirubin 0.8 0.3 - 1.2 mg/dL   GFR calc non Af Amer >60 >60 mL/min   GFR calc Af Amer >60 >60 mL/min    Comment: (NOTE) The eGFR has been calculated using the CKD EPI equation. This calculation has not been validated in all clinical situations. eGFR's persistently <60 mL/min signify possible Chronic Kidney Disease.    Anion gap 13 5 - 15      PHQ2/9: Depression screen [facility] 2/9 06/02/2016 05/07/2016 02/13/2016 11/09/2015 07/30/2015  Decreased Interest 0 3 0 0 0  Down, Depressed, Hopeless 1 3 0 0 0  PHQ - 2 Score 1 6 0 0 0  Altered sleeping 1 2 - - -  Tired,  decreased energy 0 2 - - -  Change in appetite 0 3 - - -  Feeling bad or failure about yourself  1 1 - - -  Trouble concentrating 0 2 - - -  Moving slowly or fidgety/restless 0 0 - - -  Suicidal thoughts 0 0 - - -  PHQ-9 Score 3 16 - - -  Difficult doing work/chores Not difficult at all Somewhat difficult - - -     Fall Risk: Fall Risk  06/02/2016 05/07/2016 02/13/2016 11/09/2015 07/30/2015  Falls in the past year? No No No No No     Functional Status Survey: Is the patient deaf or have difficulty hearing?: No Does the patient have difficulty seeing, even when wearing glasses/contacts?: No Does the patient have difficulty concentrating, remembering, or making decisions?: No Does the patient have difficulty walking or climbing stairs?: No Does the patient have difficulty dressing or bathing?: No Does the patient have difficulty doing errands alone such as visiting a doctor's office or shopping?: Yes (Patient does not drive)   Assessment & Plan  1. Depression, major, recurrent, mild ([place])  She does not want to take medication at this time, but has Zoloft at home if she decides to start taking it  2. Hypothyroidism (acquired)  Continue medication   3. Weight loss  It could be secondary to depression of tooth problems. Needs to take Boost or protein shakes  4. GAD (generalized anxiety disorder)  stable  5. Insomnia  No longer taking medication every night  6. Poor dentition  She will see dentist tomorrow  7. Needs flu shot  Refused - Flu vaccine HIGH DOSE PF - not given , she refused  8. Eczema  - triamcinolone  cream (KENALOG) 0.1 %; Apply 1 application topically 2 (two) times daily.  Dispense: 45 g; Refill: 0

## 2016-08-12 ENCOUNTER — Encounter: Payer: Self-pay | Admitting: Family Medicine

## 2016-08-12 ENCOUNTER — Ambulatory Visit (INDEPENDENT_AMBULATORY_CARE_PROVIDER_SITE_OTHER): Payer: Commercial Managed Care - HMO | Admitting: Family Medicine

## 2016-08-12 VITALS — BP 128/80 | HR 95 | Temp 98.4°F | Resp 16 | Ht 62.0 in | Wt 128.1 lb

## 2016-08-12 DIAGNOSIS — R7303 Prediabetes: Secondary | ICD-10-CM

## 2016-08-12 DIAGNOSIS — R739 Hyperglycemia, unspecified: Secondary | ICD-10-CM | POA: Diagnosis not present

## 2016-08-12 LAB — GLUCOSE, POCT (MANUAL RESULT ENTRY): POC Glucose: 113 mg/dl — AB (ref 70–99)

## 2016-08-12 LAB — POCT GLYCOSYLATED HEMOGLOBIN (HGB A1C): Hemoglobin A1C: 5.9

## 2016-08-12 NOTE — Progress Notes (Signed)
Name: Maureen [name]   MRN: [id number]    DOB: Mar 03, 1946   Date:08/12/2016       Progress Note  Subjective  Chief Complaint  Chief Complaint  Patient presents with  . blood glucose elevated    pt was concerned about her blood sugar levels being elevated    HPI  Pre-diabetes: she states she has been concerned about her glucose because she has some dental problems and some tooth extraction therefore she has been eating soft food, mostly carbohydrates ( mashed potatoes/candy yams ). She denies polyphagia, polyuria or polydipsia  Patient Active Problem List   Diagnosis Date Noted  . Effusion of left knee 07/30/2015  . Allergic rhinitis, seasonal 06/29/2015  . Clavus 06/29/2015  . 1st degree AV block 06/29/2015  . Hammer toe 06/29/2015  . H/O: HTN (hypertension) 06/29/2015  . Blood glucose elevated 06/29/2015  . Floater, vitreous 06/29/2015  . Bilateral tinnitus 06/29/2015  . Hypothyroidism (acquired) 04/02/2015  . Gastroesophageal reflux disease 04/02/2015  . Hammer toe of right foot 04/02/2015  . Scoliosis of thoracic spine 04/02/2015  . Urethral prolapse 04/02/2015  . Corn of toe 04/02/2015  . Hyperlipidemia 04/02/2015  . Varicose veins of both lower extremities 04/02/2015    Past Surgical History:  Procedure Laterality Date  . ABDOMINAL HYSTERECTOMY  1989  . APPENDECTOMY  1989  . BREAST EXCISIONAL BIOPSY Bilateral    multiple biopsies negative  . BREAST SURGERY     Several  . TUMOR EXCISION  1989   same time as hysterectomy    Family History  Problem Relation Age of Onset  . Diabetes Father 85    DM complications  . Hypertension Mother 82    CKD  . Thyroid disease Mother   . Heart failure Mother   . Kidney disease Mother   . Cancer Sister     Breast  . Breast cancer Sister 18  . Kidney disease    . Breast cancer Maternal Aunt   . Breast cancer Paternal Aunt     Social History   Social History  . Marital status: Divorced    Spouse name: N/A  .  Number of children: N/A  . Years of education: N/A   Occupational History  . Not on file.   Social History Main Topics  . Smoking status: Never Smoker  . Smokeless tobacco: Never Used  . Alcohol use 0.0 oz/week     Comment: occasional wine  . Drug use: No  . Sexual activity: Not Currently   Other Topics Concern  . Not on file   Social History Narrative  . No narrative on file     Current Outpatient Prescriptions:  .  b complex vitamins capsule, Take 1 capsule by mouth daily. (Patient taking differently: Take 1 capsule by mouth daily as needed. For vitamin B deficiency.), Disp: 30 capsule, Rfl: 0 .  fluticasone (FLONASE) 50 MCG/ACT nasal spray, Place 1 spray into both nostrils daily. (Patient taking differently: Place 1 spray into both nostrils daily as needed for allergies or rhinitis. ), Disp: 16 g, Rfl: 2 .  Garlic Oil 3546 MG CAPS, Take 1,000 mg by mouth daily. , Disp: , Rfl:  .  levothyroxine (SYNTHROID, LEVOTHROID) 75 MCG tablet, Take 1 tablet (75 mcg total) by mouth daily., Disp: 30 tablet, Rfl: 2 .  mirtazapine (REMERON) 15 MG tablet, Take 1-2 tablets (15-30 mg total) by mouth at bedtime. (Patient not taking: Reported on 07/07/2016), Disp: 60 tablet, Rfl: 5 .  ranitidine (  ZANTAC) 300 MG tablet, Take 1 tablet (300 mg total) by mouth 2 (two) times daily., Disp: 60 tablet, Rfl: 5 .  sertraline (ZOLOFT) 50 MG tablet, Take 1 tablet (50 mg total) by mouth daily. (Patient not taking: Reported on 07/07/2016), Disp: 30 tablet, Rfl: 0 .  triamcinolone cream (KENALOG) 0.1 %, Apply 1 application topically 2 (two) times daily., Disp: 45 g, Rfl: 0  Allergies  Allergen Reactions  . Aspirin     Tinnitus  . Citalopram Other (See Comments)  . Codeine Nausea And Vomiting  . Penicillins Rash    Has patient had a PCN reaction causing immediate rash, facial/tongue/throat swelling, SOB or lightheadedness with hypotension: Yes Has patient had a PCN reaction causing severe rash involving mucus  membranes or skin necrosis: No Has patient had a PCN reaction that required hospitalization No Has patient had a PCN reaction occurring within the last 10 years: No If all of the above answers are "NO", then may proceed with Cephalosporin use.     ROS  Ten systems reviewed and is negative except as mentioned in HPI   Objective  Vitals:   08/12/16 1513  BP: 128/80  Pulse: 95  Resp: 16  Temp: 98.4 F (36.9 C)  TempSrc: Oral  SpO2: 95%  Weight: 128 lb 2 oz (58.1 kg)  Height: 5' 2"  (1.575 m)    Body mass index is 23.43 kg/m.  Physical Exam  Constitutional: Patient appears well-developed and thino distress.  HEENT: head atraumatic, normocephalic, pupils equal and reactive to light, missing teeth neck supple, throat within normal limits Cardiovascular: Normal rate, regular rhythm and normal heart sounds.  No murmur heard. No BLE edema. Pulmonary/Chest: Effort normal and breath sounds normal. No respiratory distress. Abdominal: Soft.  There is no tenderness. Psychiatric: Patient has a normal mood and affect. behavior is normal. Judgment and thought content normal.  Recent Results (from the past 2160 hour(s))  CBC     Status: Abnormal   Collection Time: 05/31/16  8:29 AM  Result Value Ref Range   WBC 4.3 3.6 - 11.0 K/uL   RBC 4.40 3.80 - 5.20 MIL/uL   Hemoglobin 13.2 12.0 - 16.0 g/dL   HCT 39.2 35.0 - 47.0 %   MCV 89.1 80.0 - 100.0 fL   MCH 30.1 26.0 - 34.0 pg   MCHC 33.8 32.0 - 36.0 g/dL   RDW 14.6 (H) 11.5 - 14.5 %   Platelets 206 150 - 440 K/uL  Troponin I     Status: None   Collection Time: 05/31/16  8:29 AM  Result Value Ref Range   Troponin I <0.03 <0.03 ng/mL  Comprehensive metabolic panel     Status: Abnormal   Collection Time: 05/31/16  8:29 AM  Result Value Ref Range   Sodium 138 135 - 145 mmol/L   Potassium 3.5 3.5 - 5.1 mmol/L   Chloride 102 101 - 111 mmol/L   CO2 23 22 - 32 mmol/L   Glucose, Bld 102 (H) 65 - 99 mg/dL   BUN 9 6 - 20 mg/dL    Creatinine, Ser 0.71 0.44 - 1.00 mg/dL   Calcium 9.5 8.9 - 10.3 mg/dL   Total Protein 7.9 6.5 - 8.1 g/dL   Albumin 4.4 3.5 - 5.0 g/dL   AST 25 15 - 41 U/L   ALT 16 14 - 54 U/L   Alkaline Phosphatase 71 38 - 126 U/L   Total Bilirubin 0.8 0.3 - 1.2 mg/dL   GFR calc non Af Amer >  60 >60 mL/min   GFR calc Af Amer >60 >60 mL/min    Comment: (NOTE) The eGFR has been calculated using the CKD EPI equation. This calculation has not been validated in all clinical situations. eGFR's persistently <60 mL/min signify possible Chronic Kidney Disease.    Anion gap 13 5 - 15  POCT Glucose (CBG)     Status: Abnormal   Collection Time: 08/12/16  3:16 PM  Result Value Ref Range   POC Glucose 113 (A) 70 - 99 mg/dl  POCT HgB A1C     Status: None   Collection Time: 08/12/16  3:19 PM  Result Value Ref Range   Hemoglobin A1C 5.9       PHQ2/9: Depression screen [facility] 2/9 08/12/2016 06/02/2016 05/07/2016 02/13/2016 11/09/2015  Decreased Interest 0 0 3 0 0  Down, Depressed, Hopeless 0 1 3 0 0  PHQ - 2 Score 0 1 6 0 0  Altered sleeping - 1 2 - -  Tired, decreased energy - 0 2 - -  Change in appetite - 0 3 - -  Feeling bad or failure about yourself  - 1 1 - -  Trouble concentrating - 0 2 - -  Moving slowly or fidgety/restless - 0 0 - -  Suicidal thoughts - 0 0 - -  PHQ-9 Score - 3 16 - -  Difficult doing work/chores - Not difficult at all Somewhat difficult - -     Fall Risk: Fall Risk  08/12/2016 06/02/2016 05/07/2016 02/13/2016 11/09/2015  Falls in the past year? No No No No No     Functional Status Survey: Is the patient deaf or have difficulty hearing?: No Does the patient have difficulty seeing, even when wearing glasses/contacts?: No Does the patient have difficulty concentrating, remembering, or making decisions?: No Does the patient have difficulty walking or climbing stairs?: No Does the patient have difficulty dressing or bathing?: No Does the patient have difficulty doing errands alone such  as visiting a doctor's office or shopping?: No   Assessment & Plan  1. Pre-diabetes  Doing well , hgbA1C has improved down from 6.3% down to 5.8%, continue life style modification   2. Elevated blood sugar level  - POCT Glucose (CBG) - POCT HgB A1C

## 2016-09-12 ENCOUNTER — Other Ambulatory Visit: Payer: Self-pay | Admitting: Family Medicine

## 2016-09-12 DIAGNOSIS — E039 Hypothyroidism, unspecified: Secondary | ICD-10-CM

## 2016-09-12 DIAGNOSIS — K219 Gastro-esophageal reflux disease without esophagitis: Secondary | ICD-10-CM

## 2016-09-12 MED ORDER — RANITIDINE HCL 300 MG PO TABS: 300.0000 mg | ORAL_TABLET | Freq: Two times a day (BID) | ORAL | 5 refills | Status: DC

## 2016-09-12 MED ORDER — LEVOTHYROXINE SODIUM 75 MCG PO TABS: 75.0000 ug | ORAL_TABLET | Freq: Every day | ORAL | 2 refills | Status: DC

## 2016-09-12 NOTE — Telephone Encounter (Signed)
Pt requesting refill for ranitidine and levothyroxine. Please send to [email or web address].

## 2016-10-06 ENCOUNTER — Ambulatory Visit (INDEPENDENT_AMBULATORY_CARE_PROVIDER_SITE_OTHER): Payer: Commercial Managed Care - HMO | Admitting: Family Medicine

## 2016-10-06 ENCOUNTER — Encounter: Payer: Self-pay | Admitting: Family Medicine

## 2016-10-06 VITALS — BP 128/78 | HR 82 | Temp 98.7°F | Resp 16 | Ht 62.0 in | Wt 130.1 lb

## 2016-10-06 DIAGNOSIS — G4709 Other insomnia: Secondary | ICD-10-CM | POA: Diagnosis not present

## 2016-10-06 DIAGNOSIS — K219 Gastro-esophageal reflux disease without esophagitis: Secondary | ICD-10-CM | POA: Diagnosis not present

## 2016-10-06 DIAGNOSIS — R7303 Prediabetes: Secondary | ICD-10-CM | POA: Diagnosis not present

## 2016-10-06 DIAGNOSIS — F411 Generalized anxiety disorder: Secondary | ICD-10-CM | POA: Diagnosis not present

## 2016-10-06 DIAGNOSIS — F330 Major depressive disorder, recurrent, mild: Secondary | ICD-10-CM | POA: Diagnosis not present

## 2016-10-06 DIAGNOSIS — E039 Hypothyroidism, unspecified: Secondary | ICD-10-CM

## 2016-10-06 DIAGNOSIS — E7800 Pure hypercholesterolemia, unspecified: Secondary | ICD-10-CM

## 2016-10-06 LAB — TSH: TSH: 2.75 m[IU]/L

## 2016-10-06 NOTE — Progress Notes (Signed)
Name: Maureen [name]   MRN: [id number]    DOB: [birth year]   Date:10/06/2016       Progress Note  Subjective  Chief Complaint  Chief Complaint  Patient presents with  . Depression    3 month follow up with no changes  . Hypothyroidism    HPI  Major Depression recurrent/GAD: she has a history of recurrent symptoms of depression /anxiety, she was feeling down back in August 2017 - anhedonia, crying spells, worrying all the time and losing weight. She had taken Citalopram in the past ( but unable to tolerate it ) so we gave her Zoloft and also Remeron ( for appetite and sleep ). She took for a few days and stopped, she states she is feeling better now. She is afraid of getting hooked on medication  GERD: she states taking Ranitidine qhs and is controlling symptoms. She denies heartburn or indigestion. She is avoiding spicy food and tomato paste on her meals.   Hypothyroidism: weight has improved, back to baseline, taking same dose of levothyroxine. She denies constipation or dry skin.   Pre-diabetes: she has changed her diet, last hgbA1C had improved, she denies polyphagia, polydipsia or polyuria.   Hyperlipidemia: on life style modification only   Patient Active Problem List   Diagnosis Date Noted  . Effusion of left knee 07/30/2015  . Allergic rhinitis, seasonal 06/29/2015  . Clavus 06/29/2015  . 1st degree AV block 06/29/2015  . Hammer toe 06/29/2015  . H/O: HTN (hypertension) 06/29/2015  . Blood glucose elevated 06/29/2015  . Floater, vitreous 06/29/2015  . Bilateral tinnitus 06/29/2015  . Hypothyroidism (acquired) 04/02/2015  . Gastroesophageal reflux disease 04/02/2015  . Hammer toe of right foot 04/02/2015  . Scoliosis of thoracic spine 04/02/2015  . Urethral prolapse 04/02/2015  . Corn of toe 04/02/2015  . Hyperlipidemia 04/02/2015  . Varicose veins of both lower extremities 04/02/2015    Past Surgical History:  Procedure Laterality Date  . ABDOMINAL  HYSTERECTOMY  1989  . APPENDECTOMY  1989  . BREAST EXCISIONAL BIOPSY Bilateral    multiple biopsies negative  . BREAST SURGERY     Several  . TUMOR EXCISION  1989   same time as hysterectomy    Family History  Problem Relation Age of Onset  . Diabetes Father 3176    DM complications  . Hypertension Mother 4979    CKD  . Thyroid disease Mother   . Heart failure Mother   . Kidney disease Mother   . Cancer Sister     Breast  . Breast cancer Sister 8453  . Kidney disease    . Breast cancer Maternal Aunt   . Breast cancer Paternal Aunt     Social History   Social History  . Marital status: Divorced    Spouse name: N/A  . Number of children: N/A  . Years of education: N/A   Occupational History  . Not on file.   Social History Main Topics  . Smoking status: Never Smoker  . Smokeless tobacco: Never Used  . Alcohol use 0.0 oz/week     Comment: occasional wine  . Drug use: No  . Sexual activity: Not Currently   Other Topics Concern  . Not on file   Social History Narrative  . No narrative on file     Current Outpatient Prescriptions:  .  b complex vitamins capsule, Take 1 capsule by mouth daily. (Patient taking differently: Take 1 capsule by mouth daily as needed. For vitamin  B deficiency.), Disp: 30 capsule, Rfl: 0 .  fluticasone (FLONASE) 50 MCG/ACT nasal spray, Place 1 spray into both nostrils daily. (Patient taking differently: Place 1 spray into both nostrils daily as needed for allergies or rhinitis. ), Disp: 16 g, Rfl: 2 .  Garlic Oil 1000 MG CAPS, Take 1,000 mg by mouth daily. , Disp: , Rfl:  .  levothyroxine (SYNTHROID, LEVOTHROID) 75 MCG tablet, Take 1 tablet (75 mcg total) by mouth daily., Disp: 30 tablet, Rfl: 2 .  ranitidine (ZANTAC) 300 MG tablet, Take 1 tablet (300 mg total) by mouth 2 (two) times daily., Disp: 60 tablet, Rfl: 5 .  triamcinolone cream (KENALOG) 0.1 %, Apply 1 application topically 2 (two) times daily., Disp: 45 g, Rfl: 0  Allergies   Allergen Reactions  . Aspirin     Tinnitus  . Citalopram Other (See Comments)  . Codeine Nausea And Vomiting  . Penicillins Rash    Has patient had a PCN reaction causing immediate rash, facial/tongue/throat swelling, SOB or lightheadedness with hypotension: Yes Has patient had a PCN reaction causing severe rash involving mucus membranes or skin necrosis: No Has patient had a PCN reaction that required hospitalization No Has patient had a PCN reaction occurring within the last 10 years: No If all of the above answers are "NO", then may proceed with Cephalosporin use.     ROS  Constitutional: Negative for fever or weight change.  Respiratory: Negative for cough and shortness of breath.   Cardiovascular: Negative for chest pain or palpitations.  Gastrointestinal: Negative for abdominal pain, no bowel changes.  Musculoskeletal: Negative for gait problem or joint swelling.  Skin: Negative for rash.  Neurological: Negative for dizziness or headache.  No other specific complaints in a complete review of systems (except as listed in HPI above).  Objective  Vitals:   10/06/16 1039  BP: 128/78  Pulse: 82  Resp: 16  Temp: 98.7 F (37.1 C)  TempSrc: Oral  SpO2: 91%  Weight: 130 lb 1 oz (59 kg)  Height: 5\' 2"  (1.575 m)    Body mass index is 23.79 kg/m.  Physical Exam  Constitutional: Patient appears well-developed and well-nourished.  No distress.  HEENT: head atraumatic, normocephalic, pupils equal and reactive to light, normal thyroid, neck supple, throat within normal limits Cardiovascular: Normal rate, regular rhythm and normal heart sounds.  No murmur heard. No BLE edema. Pulmonary/Chest: Effort normal and breath sounds normal. No respiratory distress. Abdominal: Soft.  There is no tenderness. Psychiatric: Patient has a normal mood and affect. behavior is normal. Judgment and thought content normal.  Recent Results (from the past 2160 hour(s))  POCT Glucose (CBG)      Status: Abnormal   Collection Time: 08/12/16  3:16 PM  Result Value Ref Range   POC Glucose 113 (A) 70 - 99 mg/dl  POCT HgB [id number]     Status: None   Collection Time: 08/12/16  3:19 PM  Result Value Ref Range   Hemoglobin A1C 5.9       PHQ2/9: Depression screen [facility] 10/06/2016 08/12/2016 06/02/2016 05/07/2016 02/13/2016  Decreased Interest 0 0 0 3 0  Down, Depressed, Hopeless 0 0 1 3 0  PHQ - 2 Score 0 0 1 6 0  Altered sleeping - - 1 2 -  Tired, decreased energy - - 0 2 -  Change in appetite - - 0 3 -  Feeling bad or failure about yourself  - - 1 1 -  Trouble concentrating - - 0  2 -  Moving slowly or fidgety/restless - - 0 0 -  Suicidal thoughts - - 0 0 -  PHQ-9 Score - - 3 16 -  Difficult doing work/chores - - Not difficult at all Somewhat difficult -    Fall Risk: Fall Risk  10/06/2016 08/12/2016 06/02/2016 05/07/2016 02/13/2016  Falls in the past year? No No No No No      Functional Status Survey: Is the patient deaf or have difficulty hearing?: No Does the patient have difficulty seeing, even when wearing glasses/contacts?: No Does the patient have difficulty concentrating, remembering, or making decisions?: No Does the patient have difficulty walking or climbing stairs?: No Does the patient have difficulty dressing or bathing?: No Does the patient have difficulty doing errands alone such as visiting a doctor's office or shopping?: No    Assessment & Plan  1. Pre-diabetes  Reviewed labs and is doing better  2. Depression, major, recurrent, mild (HCC)  Discussed medication, but she wants to hold off, she states she is feeling better  3. Hypothyroidism (acquired)  - TSH  4. GAD (generalized anxiety disorder)  Doing well at this time  5. Other insomnia  Sleep better, not taking medication   6. Gastroesophageal reflux disease without esophagitis  Controlled with medication   7. Pure hypercholesterolemia  On diet only

## 2016-10-14 ENCOUNTER — Telehealth: Payer: Self-pay | Admitting: Family Medicine

## 2016-10-14 NOTE — Telephone Encounter (Signed)
Patient is requesting refill on levothyroxine. States that the pharmacy told her that they are using different company and she has to contact her pcp for the refill. Please send to [facility]

## 2016-10-14 NOTE — Telephone Encounter (Signed)
Spoke to pharmacy and gave verbal ok for it to be refilled

## 2016-10-28 ENCOUNTER — Other Ambulatory Visit: Payer: Self-pay | Admitting: Family Medicine

## 2016-10-28 DIAGNOSIS — Z1231 Encounter for screening mammogram for malignant neoplasm of breast: Secondary | ICD-10-CM

## 2016-11-10 ENCOUNTER — Telehealth: Payer: Self-pay | Admitting: Family Medicine

## 2016-11-10 NOTE — Telephone Encounter (Signed)
Patient got her head wet on Friday and ended up with a head cold/congestion. Pt is asking that yo please send an antibiotic to [facility]. Pt was informed that she may have to go to urgent care or may have to be seen here but she stated that she does not have transportation today

## 2016-11-10 NOTE — Telephone Encounter (Signed)
Patient informed. 

## 2016-11-10 NOTE — Telephone Encounter (Signed)
I am sorry, colds are usually viral. She can try otc medication, no need for antibiotics

## 2016-11-11 DIAGNOSIS — R05 Cough: Secondary | ICD-10-CM | POA: Diagnosis not present

## 2016-12-05 ENCOUNTER — Ambulatory Visit
Admission: RE | Admit: 2016-12-05 | Discharge: 2016-12-05 | Disposition: A | Discharge status: Home / Self Care | Payer: Commercial Managed Care - HMO | Source: Ambulatory Visit | Attending: Family Medicine | Admitting: Family Medicine

## 2016-12-05 DIAGNOSIS — Z1231 Encounter for screening mammogram for malignant neoplasm of breast: Secondary | ICD-10-CM | POA: Diagnosis not present

## 2016-12-15 ENCOUNTER — Other Ambulatory Visit: Payer: Self-pay | Admitting: Family Medicine

## 2016-12-15 DIAGNOSIS — E039 Hypothyroidism, unspecified: Secondary | ICD-10-CM

## 2016-12-15 MED ORDER — LEVOTHYROXINE SODIUM 75 MCG PO TABS: 75.0000 ug | ORAL_TABLET | Freq: Every day | ORAL | 0 refills | Status: DC

## 2016-12-15 NOTE — Telephone Encounter (Signed)
Pt needs refill on Levothyroxine to be sent to [facility] [address].  °

## 2017-01-08 ENCOUNTER — Other Ambulatory Visit: Payer: Self-pay | Admitting: Family Medicine

## 2017-01-08 DIAGNOSIS — E039 Hypothyroidism, unspecified: Secondary | ICD-10-CM

## 2017-01-08 NOTE — Telephone Encounter (Signed)
Pt had appointment for 01-09-17 but it was rescheduled due to you not being in the office. She did reschedule for 01-20-17 and will be out of levothyroxine before appt. Please send a refill to [facility]. [phone]

## 2017-01-09 ENCOUNTER — Ambulatory Visit: Payer: Commercial Managed Care - HMO | Admitting: Family Medicine

## 2017-01-09 MED ORDER — LEVOTHYROXINE SODIUM 75 MCG PO TABS: 75.0000 ug | ORAL_TABLET | Freq: Every day | ORAL | 0 refills | Status: DC

## 2017-01-09 NOTE — Telephone Encounter (Signed)
Pt verbally informed. °

## 2017-01-09 NOTE — Telephone Encounter (Signed)
[facility] [address]

## 2017-01-20 ENCOUNTER — Ambulatory Visit (INDEPENDENT_AMBULATORY_CARE_PROVIDER_SITE_OTHER): Payer: Medicare HMO | Admitting: Family Medicine

## 2017-01-20 ENCOUNTER — Encounter: Payer: Self-pay | Admitting: Family Medicine

## 2017-01-20 VITALS — BP 120/66 | HR 82 | Temp 98.2°F | Resp 16 | Ht 62.0 in | Wt 132.1 lb

## 2017-01-20 DIAGNOSIS — E039 Hypothyroidism, unspecified: Secondary | ICD-10-CM | POA: Diagnosis not present

## 2017-01-20 DIAGNOSIS — K089 Disorder of teeth and supporting structures, unspecified: Secondary | ICD-10-CM

## 2017-01-20 DIAGNOSIS — E782 Mixed hyperlipidemia: Secondary | ICD-10-CM

## 2017-01-20 DIAGNOSIS — Z79899 Other long term (current) drug therapy: Secondary | ICD-10-CM | POA: Diagnosis not present

## 2017-01-20 DIAGNOSIS — F330 Major depressive disorder, recurrent, mild: Secondary | ICD-10-CM | POA: Diagnosis not present

## 2017-01-20 DIAGNOSIS — F411 Generalized anxiety disorder: Secondary | ICD-10-CM

## 2017-01-20 DIAGNOSIS — R7303 Prediabetes: Secondary | ICD-10-CM | POA: Diagnosis not present

## 2017-01-20 LAB — CBC WITH DIFFERENTIAL/PLATELET
BASOS PCT: 1 %
Basophils Absolute: 37 cells/uL (ref 0–200)
EOS ABS: 74 {cells}/uL (ref 15–500)
Eosinophils Relative: 2 %
HEMATOCRIT: 39.1 % (ref 35.0–45.0)
Hemoglobin: 12.9 g/dL (ref 11.7–15.5)
LYMPHS PCT: 46 %
Lymphs Abs: 1702 cells/uL (ref 850–3900)
MCH: 29.5 pg (ref 27.0–33.0)
MCHC: 33 g/dL (ref 32.0–36.0)
MCV: 89.5 fL (ref 80.0–100.0)
MONO ABS: 333 {cells}/uL (ref 200–950)
MPV: 10.4 fL (ref 7.5–12.5)
Monocytes Relative: 9 %
Neutro Abs: 1554 cells/uL (ref 1500–7800)
Neutrophils Relative %: 42 %
Platelets: 212 10*3/uL (ref 140–400)
RBC: 4.37 MIL/uL (ref 3.80–5.10)
RDW: 15.2 % — AB (ref 11.0–15.0)
WBC: 3.7 10*3/uL — ABNORMAL LOW (ref 3.8–10.8)

## 2017-01-20 NOTE — Progress Notes (Signed)
Name: Maureen [name]   MRN: [id number]    DOB: [birth year]   Date:01/20/2017       Progress Note  Subjective  Chief Complaint  Chief Complaint  Patient presents with  . Depression    3 month follow up  . Anxiety  . Insomnia    HPI  Major Depression recurrent/GAD: she has a history of recurrent symptoms of depression /anxiety, she was feeling down back in August 2017 - anhedonia, crying spells, worrying all the time and losing weight. She had taken Citalopram in the past ( but unable to tolerate it ) so we gave her Zoloft and also Remeron ( for appetite and sleep ). She took for a few days and stopped, she states she is feeling better now. She is afraid of getting hooked on medication. She denies any sadness at this time  GERD: she states taking Ranitidine qhs and is controlling symptoms. She denies heartburn or indigestion. She is avoiding spicy food and tomato paste on her meals. Discussed to try to wean self off to one pill prn.   Hypothyroidism: weight has improved, back to baseline, taking same dose of levothyroxine. She denies constipation or dry skin.   Pre-diabetes: she has changed her diet, last hgbA1C had improved, she denies polyphagia, polydipsia or polyuria. We will recheck levels.   Hyperlipidemia: on life style modification only, she would like to have labs re-drawn  Long term medication: taking otc supplementation, we will check some labs. Also on Ranitidine daily    Patient Active Problem List   Diagnosis Date Noted  . Effusion of left knee 07/30/2015  . Allergic rhinitis, seasonal 06/29/2015  . Clavus 06/29/2015  . 1st degree AV block 06/29/2015  . Hammer toe 06/29/2015  . H/O: HTN (hypertension) 06/29/2015  . Blood glucose elevated 06/29/2015  . Floater, vitreous 06/29/2015  . Bilateral tinnitus 06/29/2015  . Hypothyroidism (acquired) 04/02/2015  . Gastroesophageal reflux disease 04/02/2015  . Hammer toe of right foot 04/02/2015  . Scoliosis of thoracic  spine 04/02/2015  . Urethral prolapse 04/02/2015  . Corn of toe 04/02/2015  . Hyperlipidemia 04/02/2015  . Varicose veins of both lower extremities 04/02/2015    Past Surgical History:  Procedure Laterality Date  . ABDOMINAL HYSTERECTOMY  1989  . APPENDECTOMY  1989  . BREAST EXCISIONAL BIOPSY Bilateral    multiple biopsies negative  . BREAST SURGERY     Several  . TUMOR EXCISION  1989   same time as hysterectomy    Family History  Problem Relation Age of Onset  . Diabetes Father 8    DM complications  . Hypertension Mother 58    CKD  . Thyroid disease Mother   . Heart failure Mother   . Kidney disease Mother   . Cancer Sister     Breast  . Breast cancer Sister 50  . Kidney disease    . Breast cancer Maternal Aunt   . Breast cancer Paternal Aunt     Social History   Social History  . Marital status: Single    Spouse name: N/A  . Number of children: N/A  . Years of education: N/A   Occupational History  . Not on file.   Social History Main Topics  . Smoking status: Never Smoker  . Smokeless tobacco: Never Used  . Alcohol use 0.0 oz/week     Comment: occasional wine  . Drug use: No  . Sexual activity: Not Currently   Other Topics Concern  . Not  on file   Social History Narrative  . No narrative on file     Current Outpatient Prescriptions:  .  b complex vitamins capsule, Take 1 capsule by mouth daily. (Patient taking differently: Take 1 capsule by mouth daily as needed. For vitamin B deficiency.), Disp: 30 capsule, Rfl: 0 .  fluticasone (FLONASE) 50 MCG/ACT nasal spray, Place 1 spray into both nostrils daily. (Patient taking differently: Place 1 spray into both nostrils daily as needed for allergies or rhinitis. ), Disp: 16 g, Rfl: 2 .  Garlic Oil 1000 MG CAPS, Take 1,000 mg by mouth daily. , Disp: , Rfl:  .  levothyroxine (SYNTHROID, LEVOTHROID) 75 MCG tablet, Take 1 tablet (75 mcg total) by mouth daily., Disp: 30 tablet, Rfl: 0 .  ranitidine  (ZANTAC) 300 MG tablet, Take 1 tablet (300 mg total) by mouth 2 (two) times daily., Disp: 60 tablet, Rfl: 5 .  triamcinolone cream (KENALOG) 0.1 %, Apply 1 application topically 2 (two) times daily., Disp: 45 g, Rfl: 0  Allergies  Allergen Reactions  . Aspirin     Tinnitus  . Citalopram Other (See Comments)  . Codeine Nausea And Vomiting  . Penicillins Rash    Has patient had a PCN reaction causing immediate rash, facial/tongue/throat swelling, SOB or lightheadedness with hypotension: Yes Has patient had a PCN reaction causing severe rash involving mucus membranes or skin necrosis: No Has patient had a PCN reaction that required hospitalization No Has patient had a PCN reaction occurring within the last 10 years: No If all of the above answers are "NO", then may proceed with Cephalosporin use.     ROS  Constitutional: Negative for fever or significant  weight change.  Respiratory: Negative for cough and shortness of breath.   Cardiovascular: Negative for chest pain or palpitations.  Gastrointestinal: Negative for abdominal pain, no bowel changes.  Musculoskeletal: Negative for gait problem or joint swelling.  Skin: Negative for rash.  Neurological: Negative for dizziness or headache.  No other specific complaints in a complete review of systems (except as listed in HPI above).  Objective  Vitals:   01/20/17 1010  BP: 120/66  Pulse: 82  Resp: 16  Temp: 98.2 F (36.8 C)  SpO2: 92%  Weight: 132 lb 1 oz (59.9 kg)  Height: 5\' 2"  (1.575 m)    Body mass index is 24.15 kg/m.  Physical Exam  Constitutional: Patient appears well-developed and well-nourished.  No distress.  HEENT: head atraumatic, normocephalic, pupils equal and reactive to light, neck supple, throat within normal limits, normal thyroid exam Cardiovascular: Normal rate, regular rhythm and normal heart sounds.  No murmur heard. No BLE edema. Pulmonary/Chest: Effort normal and breath sounds normal. No  respiratory distress. Abdominal: Soft.  There is no tenderness. Psychiatric: Patient has a normal mood and affect. behavior is normal. Judgment and thought content normal.  PHQ2/9: [facility] 10/06/2016 08/12/2016 06/02/2016 05/07/2016 02/13/2016  Decreased Interest 0 0 0 3 0  Down, Depressed, Hopeless 0 0 1 3 0  PHQ - 2 Score 0 0 1 6 0  Altered sleeping - - 1 2 -  Tired, decreased energy - - 0 2 -  Change in appetite - - 0 3 -  Feeling bad or failure about yourself  - - 1 1 -  Trouble concentrating - - 0 2 -  Moving slowly or fidgety/restless - - 0 0 -  Suicidal thoughts - - 0 0 -  PHQ-9 Score - - 3 16 -  Difficult doing work/chores - - Not difficult at all Somewhat difficult -     Fall Risk: Fall Risk  10/06/2016 08/12/2016 06/02/2016 05/07/2016 02/13/2016  Falls in the past year? No No No No No      Assessment & Plan  1. Hypothyroidism (acquired)  - TSH  2. Pre-diabetes  - Hemoglobin A1c - Insulin, fasting  3. Depression, major, recurrent, mild (HCC)  She is feeling well   4. GAD (generalized anxiety disorder)  She is doing much better since she took care of her teeth  5. Poor dentition  She is going back to the dentist, able to eat again  6. Mixed hyperlipidemia  - Lipid panel  7. Long-term use of high-risk medication  - COMPLETE METABOLIC PANEL WITH GFR - CBC with Differential/Platelet

## 2017-01-21 LAB — COMPLETE METABOLIC PANEL WITH GFR
ALT: 11 U/L (ref 6–29)
AST: 20 U/L (ref 10–35)
Albumin: 4.3 g/dL (ref 3.6–5.1)
Alkaline Phosphatase: 69 U/L (ref 33–130)
BILIRUBIN TOTAL: 0.5 mg/dL (ref 0.2–1.2)
BUN: 9 mg/dL (ref 7–25)
CHLORIDE: 101 mmol/L (ref 98–110)
CO2: 33 mmol/L — ABNORMAL HIGH (ref 20–31)
CREATININE: 0.71 mg/dL (ref 0.60–0.93)
Calcium: 9.3 mg/dL (ref 8.6–10.4)
GFR, Est Non African American: 86 mL/min (ref >=60)
GLUCOSE: 84 mg/dL (ref 65–99)
Potassium: 4.9 mmol/L (ref 3.5–5.3)
Sodium: 139 mmol/L (ref 135–146)
TOTAL PROTEIN: 7.6 g/dL (ref 6.1–8.1)

## 2017-01-21 LAB — HEMOGLOBIN A1C
Hgb A1c MFr Bld: 5.7 % — ABNORMAL HIGH (ref <5.7)
MEAN PLASMA GLUCOSE: 117 mg/dL

## 2017-01-21 LAB — LIPID PANEL
CHOL/HDL RATIO: 4.7 ratio (ref <5.0)
Cholesterol: 193 mg/dL (ref <200)
HDL: 41 mg/dL — AB (ref >50)
LDL Cholesterol: 135 mg/dL — ABNORMAL HIGH (ref <100)
TRIGLYCERIDES: 85 mg/dL (ref <150)
VLDL: 17 mg/dL (ref <30)

## 2017-01-21 LAB — TSH: TSH: 2.29 mIU/L

## 2017-01-21 LAB — INSULIN, FASTING: Insulin fasting, serum: 5 u[IU]/mL (ref 2.0–19.6)

## 2017-02-09 ENCOUNTER — Telehealth: Payer: Self-pay | Admitting: Family Medicine

## 2017-02-09 NOTE — Telephone Encounter (Signed)
Pt aware of results 

## 2017-02-09 NOTE — Telephone Encounter (Signed)
Checking status on lab results. Please return call [phone]

## 2017-02-12 ENCOUNTER — Other Ambulatory Visit: Payer: Self-pay | Admitting: Family Medicine

## 2017-02-12 DIAGNOSIS — E039 Hypothyroidism, unspecified: Secondary | ICD-10-CM

## 2017-02-12 NOTE — Telephone Encounter (Signed)
PT IS NEEDING REFILL ON HER THYROID MEDICATION. HAS ONLY  LEFT. PHARM IS [facility] [address]

## 2017-02-13 MED ORDER — LEVOTHYROXINE SODIUM 75 MCG PO TABS: 75.0000 ug | ORAL_TABLET | Freq: Every day | ORAL | 0 refills | Status: DC

## 2017-03-09 ENCOUNTER — Other Ambulatory Visit: Payer: Self-pay | Admitting: Family Medicine

## 2017-03-09 DIAGNOSIS — E039 Hypothyroidism, unspecified: Secondary | ICD-10-CM

## 2017-03-09 NOTE — Telephone Encounter (Signed)
Pt has appt scheduled for June however, she will be completely out of levothyroxine before her appt. Please send a refill to [facility]

## 2017-03-10 MED ORDER — LEVOTHYROXINE SODIUM 75 MCG PO TABS: 75.0000 ug | ORAL_TABLET | Freq: Every day | ORAL | 0 refills | Status: DC

## 2017-04-08 ENCOUNTER — Ambulatory Visit (INDEPENDENT_AMBULATORY_CARE_PROVIDER_SITE_OTHER): Payer: Medicare HMO | Admitting: Family Medicine

## 2017-04-08 ENCOUNTER — Encounter: Payer: Self-pay | Admitting: Family Medicine

## 2017-04-08 VITALS — BP 124/64 | HR 84 | Temp 98.3°F | Resp 16 | Ht 60.5 in | Wt 133.6 lb

## 2017-04-08 DIAGNOSIS — E2839 Other primary ovarian failure: Secondary | ICD-10-CM

## 2017-04-08 DIAGNOSIS — Z0000 Encounter for general adult medical examination without abnormal findings: Secondary | ICD-10-CM | POA: Diagnosis not present

## 2017-04-08 DIAGNOSIS — Z23 Encounter for immunization: Secondary | ICD-10-CM

## 2017-04-08 NOTE — Progress Notes (Signed)
Name: Maureen [name]   MRN: [id number]    DOB: October 14, 1946   Date:04/08/2017       Progress Note  Subjective  Chief Complaint  Chief Complaint  Patient presents with  . Medicare Wellness    HPI  Functional ability/safety issues: No Issues Hearing issues: Addressed  Activities of daily living: Discussed Home safety issues: No Issues  End Of Life Planning: Offered verbal information regarding advanced directives, healthcare power of attorney.  Preventative care, Health maintenance, Preventative health measures discussed.  Preventative screenings discussed today: lab work, colonoscopy,  mammogram, DEXA - she agrees in having it done.  Low Dose CT Chest recommended if Age 71-80 years, 30 pack-year currently smoking OR have quit w/in 15years.   Lifestyle risk factor issued reviewed: Diet, exercise, weight management, advised patient smoking is not healthy, nutrition/diet.  Preventative health measures discussed (5-10 year plan).  Reviewed and recommended vaccinations: refuses all immunizations - Pneumovax  - Prevnar  - Annual Influenza - Zostavax - Tdap   Depression screening: Done Fall risk screening: Done Discuss ADLs/IADLs: Done  Current medical providers: See HPI  Other health risk factors identified this visit: No other issues Cognitive impairment issues: None identified  All above discussed with patient. Appropriate education, counseling and referral will be made based upon the above.   She denies bladder problems.   Patient Active Problem List   Diagnosis Date Noted  . Effusion of left knee 07/30/2015  . Allergic rhinitis, seasonal 06/29/2015  . Clavus 06/29/2015  . 1st degree AV block 06/29/2015  . Hammer toe 06/29/2015  . H/O: HTN (hypertension) 06/29/2015  . Blood glucose elevated 06/29/2015  . Floater, vitreous 06/29/2015  . Bilateral tinnitus 06/29/2015  . Hypothyroidism (acquired) 04/02/2015  . Gastroesophageal reflux disease 04/02/2015  . Hammer  toe of right foot 04/02/2015  . Scoliosis of thoracic spine 04/02/2015  . Urethral prolapse 04/02/2015  . Corn of toe 04/02/2015  . Hyperlipidemia 04/02/2015  . Varicose veins of both lower extremities 04/02/2015    Past Surgical History:  Procedure Laterality Date  . ABDOMINAL HYSTERECTOMY  1989  . APPENDECTOMY  1989  . BREAST EXCISIONAL BIOPSY Bilateral    multiple biopsies negative  . BREAST SURGERY     Several  . TUMOR EXCISION  1989   same time as hysterectomy    Family History  Problem Relation Age of Onset  . Diabetes Father 37       DM complications  . Hypertension Mother 66       CKD  . Thyroid disease Mother   . Heart failure Mother   . Kidney disease Mother   . Breast cancer Sister 71  . Kidney disease Unknown   . Breast cancer Maternal Aunt   . Breast cancer Paternal Aunt     Social History   Social History  . Marital status: Single    Spouse name: N/A  . Number of children: N/A  . Years of education: N/A   Occupational History  . Not on file.   Social History Main Topics  . Smoking status: Never Smoker  . Smokeless tobacco: Never Used  . Alcohol use 0.0 oz/week     Comment: occasional wine  . Drug use: No  . Sexual activity: Not Currently   Other Topics Concern  . Not on file   Social History Narrative  . No narrative on file     Current Outpatient Prescriptions:  .  b complex vitamins capsule, Take 1 capsule by mouth  daily. (Patient taking differently: Take 1 capsule by mouth daily as needed. For vitamin B deficiency.), Disp: 30 capsule, Rfl: 0 .  fluticasone (FLONASE) 50 MCG/ACT nasal spray, Place 1 spray into both nostrils daily. (Patient taking differently: Place 1 spray into both nostrils daily as needed for allergies or rhinitis. ), Disp: 16 g, Rfl: 2 .  Garlic Oil 1062 MG CAPS, Take 1,000 mg by mouth daily. , Disp: , Rfl:  .  levothyroxine (SYNTHROID, LEVOTHROID) 75 MCG tablet, Take 1 tablet (75 mcg total) by mouth daily., Disp: 30  tablet, Rfl: 0 .  ranitidine (ZANTAC) 300 MG tablet, Take 1 tablet (300 mg total) by mouth 2 (two) times daily., Disp: 60 tablet, Rfl: 5 .  triamcinolone cream (KENALOG) 0.1 %, Apply 1 application topically 2 (two) times daily., Disp: 45 g, Rfl: 0  Allergies  Allergen Reactions  . Aspirin     Tinnitus  . Citalopram Other (See Comments)  . Codeine Nausea And Vomiting  . Penicillins Rash    Has patient had a PCN reaction causing immediate rash, facial/tongue/throat swelling, SOB or lightheadedness with hypotension: Yes Has patient had a PCN reaction causing severe rash involving mucus membranes or skin necrosis: No Has patient had a PCN reaction that required hospitalization No Has patient had a PCN reaction occurring within the last 10 years: No If all of the above answers are "NO", then may proceed with Cephalosporin use.     ROS  Constitutional: Negative for fever or weight change.  Respiratory: Negative for cough and shortness of breath.   Cardiovascular: Negative for chest pain or palpitations.  Gastrointestinal: Negative for abdominal pain, no bowel changes.  Musculoskeletal: Negative for gait problem or joint swelling.  Skin: Negative for rash.  Neurological: Negative for dizziness or headache.  No other specific complaints in a complete review of systems (except as listed in HPI above).  Objective  Vitals:   04/08/17 1104  BP: 124/64  Pulse: 84  Resp: 16  Temp: 98.3 F (36.8 C)  TempSrc: Oral  SpO2: 95%  Weight: 133 lb 9.6 oz (60.6 kg)  Height: 5' 0.5" (1.537 m)    Body mass index is 25.66 kg/m.  Physical Exam  Constitutional: Patient appears well-developed and well-nourished.No distress.  HEENT: head atraumatic, normocephalic, pupils equal and reactive to light, ears normal TM bilaterally,  neck supple, throat within normal limits Cardiovascular: Normal rate, regular rhythm and normal heart sounds.  No murmur heard. No BLE edema. Pulmonary/Chest: Effort  normal and breath sounds normal. No respiratory distress. Abdominal: Soft.  There is no tenderness. Muscular skeletal: bunion on both feet, hammer toe and corn formation on both feet, scoliosis present Psychiatric: Patient has a normal mood and affect. behavior is normal. Judgment and thought content normal. Gyn: pelvic not done Breast: previous scars, normal exam today, no lumps  Recent Results (from the past 2160 hour(s))  COMPLETE METABOLIC PANEL WITH GFR     Status: Abnormal   Collection Time: 01/20/17 11:04 AM  Result Value Ref Range   Sodium 139 135 - 146 mmol/L   Potassium 4.9 3.5 - 5.3 mmol/L   Chloride 101 98 - 110 mmol/L   CO2 33 (H) 20 - 31 mmol/L   Glucose, Bld 84 65 - 99 mg/dL   BUN 9 7 - 25 mg/dL   Creat 0.71 0.60 - 0.93 mg/dL    Comment:   For patients > or = 71 years of age: The upper reference limit for Creatinine is approximately 13%  higher for people identified as African-American.      Total Bilirubin 0.5 0.2 - 1.2 mg/dL   Alkaline Phosphatase 69 33 - 130 U/L   AST 20 10 - 35 U/L   ALT 11 6 - 29 U/L   Total Protein 7.6 6.1 - 8.1 g/dL   Albumin 4.3 3.6 - 5.1 g/dL   Calcium 9.3 8.6 - 10.4 mg/dL   GFR, Est African American >89 >=60 mL/min   GFR, Est Non African American 86 >=60 mL/min  CBC with Differential/Platelet     Status: Abnormal   Collection Time: 01/20/17 11:04 AM  Result Value Ref Range   WBC 3.7 (L) 3.8 - 10.8 K/uL   RBC 4.37 3.80 - 5.10 MIL/uL   Hemoglobin 12.9 11.7 - 15.5 g/dL   HCT 39.1 35.0 - 45.0 %   MCV 89.5 80.0 - 100.0 fL   MCH 29.5 27.0 - 33.0 pg   MCHC 33.0 32.0 - 36.0 g/dL   RDW 15.2 (H) 11.0 - 15.0 %   Platelets 212 140 - 400 K/uL   MPV 10.4 7.5 - 12.5 fL   Neutro Abs 1,554 1,500 - 7,800 cells/uL   Lymphs Abs 1,702 850 - 3,900 cells/uL   Monocytes Absolute 333 200 - 950 cells/uL   Eosinophils Absolute 74 15 - 500 cells/uL   Basophils Absolute 37 0 - 200 cells/uL   Neutrophils Relative % 42 %   Lymphocytes Relative 46 %    Monocytes Relative 9 %   Eosinophils Relative 2 %   Basophils Relative 1 %   Smear Review Criteria for review not met   TSH     Status: None   Collection Time: 01/20/17 11:04 AM  Result Value Ref Range   TSH 2.29 mIU/L    Comment:   Reference Range   > or = 20 Years  0.40-4.50   Pregnancy Range First trimester  0.26-2.66 Second trimester 0.55-2.73 Third trimester  0.43-2.91     Hemoglobin A1c     Status: Abnormal   Collection Time: 01/20/17 11:04 AM  Result Value Ref Range   Hgb A1c MFr Bld 5.7 (H) <5.7 %    Comment:   For someone without known diabetes, a hemoglobin A1c value between 5.7% and 6.4% is consistent with prediabetes and should be confirmed with a follow-up test.   For someone with known diabetes, a value <7% indicates that their diabetes is well controlled. A1c targets should be individualized based on duration of diabetes, age, co-morbid conditions and other considerations.   This assay result is consistent with an increased risk of diabetes.   Currently, no consensus exists regarding use of hemoglobin A1c for diagnosis of diabetes in children.      Mean Plasma Glucose 117 mg/dL  Insulin, fasting     Status: None   Collection Time: 01/20/17 11:04 AM  Result Value Ref Range   Insulin fasting, serum 5.0 2.0 - 19.6 uIU/mL    Comment:   This insulin assay shows strong cross-reactivity for some insulin analogs (lispro, aspart, and glargine) and much lower cross-reactivity with others (detemir, glulisine).   Stimulated Insulin reference intervals were established using the Siemens Immulite assay. These values are provided for general guidance only.   Lipid panel     Status: Abnormal   Collection Time: 01/20/17 11:04 AM  Result Value Ref Range   Cholesterol 193 <200 mg/dL   Triglycerides 85 <150 mg/dL   HDL 41 (L) >50 mg/dL   Total CHOL/HDL Ratio 4.7 <  5.0 Ratio   VLDL 17 <30 mg/dL   LDL Cholesterol 135 (H) <100 mg/dL    PHQ2/9: Depression screen  [facility] 2/9 04/08/2017 10/06/2016 08/12/2016 06/02/2016 05/07/2016  Decreased Interest 0 0 0 0 3  Down, Depressed, Hopeless 0 0 0 1 3  PHQ - 2 Score 0 0 0 1 6  Altered sleeping - - - 1 2  Tired, decreased energy - - - 0 2  Change in appetite - - - 0 3  Feeling bad or failure about yourself  - - - 1 1  Trouble concentrating - - - 0 2  Moving slowly or fidgety/restless - - - 0 0  Suicidal thoughts - - - 0 0  PHQ-9 Score - - - 3 16  Difficult doing work/chores - - - Not difficult at all Somewhat difficult     Fall Risk: Fall Risk  04/08/2017 10/06/2016 08/12/2016 06/02/2016 05/07/2016  Falls in the past year? _0     Current Exercise Habits: Home exercise routine, Type of exercise: walking, Time (Minutes): 30, Frequency (Times/Week): 3, Weekly Exercise (Minutes/Week): 90, Intensity: Mild     Functional Status Survey: Is the patient deaf or have difficulty hearing?: No Does the patient have difficulty seeing, even when wearing glasses/contacts?: No Does the patient have difficulty concentrating, remembering, or making decisions?: No Does the patient have difficulty walking or climbing stairs?: No Does the patient have difficulty dressing or bathing?: No Does the patient have difficulty doing errands alone such as visiting a doctor's office or shopping?: No    Assessment & Plan  1. Medicare annual wellness visit, subsequent  Discussed importance of 150 minutes of physical activity weekly, eat two servings of fish weekly, eat one serving of tree nuts ( cashews, pistachios, pecans, almonds.Marland Kitchen) every other day, eat 6 servings of fruit/vegetables daily and drink plenty of water and avoid sweet beverages.   2. Need for shingles vaccine  refused  3. Need for Tdap vaccination  refused  4. Need for vaccination for pneumococcus  Refused  5. Ovarian failure  - DG Bone Density; Future

## 2017-04-08 NOTE — Patient Instructions (Signed)
Preventive Care 65 Years and Older, Female Preventive care refers to lifestyle choices and visits with your health care provider that can promote health and wellness. What does preventive care include?  A yearly physical exam. This is also called an annual well check.  Dental exams once or twice a year.  Routine eye exams. Ask your health care provider how often you should have your eyes checked.  Personal lifestyle choices, including: ? Daily care of your teeth and gums. ? Regular physical activity. ? Eating a healthy diet. ? Avoiding tobacco and drug use. ? Limiting alcohol use. ? Practicing safe sex. ? Taking low-dose aspirin every day. ? Taking vitamin and mineral supplements as recommended by your health care provider. What happens during an annual well check? The services and screenings done by your health care provider during your annual well check will depend on your age, overall health, lifestyle risk factors, and family history of disease. Counseling Your health care provider may ask you questions about your:  Alcohol use.  Tobacco use.  Drug use.  Emotional well-being.  Home and relationship well-being.  Sexual activity.  Eating habits.  History of falls.  Memory and ability to understand (cognition).  Work and work environment.  Reproductive health.  Screening You may have the following tests or measurements:  Height, weight, and BMI.  Blood pressure.  Lipid and cholesterol levels. These may be checked every 5 years, or more frequently if you are over 50 years old.  Skin check.  Lung cancer screening. You may have this screening every year starting at age 55 if you have a 30-pack-year history of smoking and currently smoke or have quit within the past 15 years.  Fecal occult blood test (FOBT) of the stool. You may have this test every year starting at age 50.  Flexible sigmoidoscopy or colonoscopy. You may have a sigmoidoscopy every 5 years or  a colonoscopy every 10 years starting at age 50.  Hepatitis C blood test.  Hepatitis B blood test.  Sexually transmitted disease (STD) testing.  Diabetes screening. This is done by checking your blood sugar (glucose) after you have not eaten for a while (fasting). You may have this done every 1-3 years.  Bone density scan. This is done to screen for osteoporosis. You may have this done starting at age 65.  Mammogram. This may be done every 1-2 years. Talk to your health care provider about how often you should have regular mammograms.  Talk with your health care provider about your test results, treatment options, and if necessary, the need for more tests. Vaccines Your health care provider may recommend certain vaccines, such as:  Influenza vaccine. This is recommended every year.  Tetanus, diphtheria, and acellular pertussis (Tdap, Td) vaccine. You may need a Td booster every 10 years.  Varicella vaccine. You may need this if you have not been vaccinated.  Zoster vaccine. You may need this after age 60.  Measles, mumps, and rubella (MMR) vaccine. You may need at least one dose of MMR if you were born in 1957 or later. You may also need a second dose.  Pneumococcal 13-valent conjugate (PCV13) vaccine. One dose is recommended after age 65.  Pneumococcal polysaccharide (PPSV23) vaccine. One dose is recommended after age 65.  Meningococcal vaccine. You may need this if you have certain conditions.  Hepatitis A vaccine. You may need this if you have certain conditions or if you travel or work in places where you may be exposed to hepatitis   A.  Hepatitis B vaccine. You may need this if you have certain conditions or if you travel or work in places where you may be exposed to hepatitis B.  Haemophilus influenzae type b (Hib) vaccine. You may need this if you have certain conditions.  Talk to your health care provider about which screenings and vaccines you need and how often you  need them. This information is not intended to replace advice given to you by your health care provider. Make sure you discuss any questions you have with your health care provider. Document Released: 11/09/2015 Document Revised: 07/02/2016 Document Reviewed: 08/14/2015 Elsevier Interactive Patient Education  2017 [employer].

## 2017-04-15 ENCOUNTER — Other Ambulatory Visit: Payer: Self-pay | Admitting: Family Medicine

## 2017-04-15 DIAGNOSIS — E039 Hypothyroidism, unspecified: Secondary | ICD-10-CM

## 2017-04-15 NOTE — Telephone Encounter (Signed)
Patient requesting refill of Levothyroxine to [facility]. 

## 2017-05-12 ENCOUNTER — Other Ambulatory Visit: Payer: Self-pay | Admitting: Family Medicine

## 2017-05-12 DIAGNOSIS — K219 Gastro-esophageal reflux disease without esophagitis: Secondary | ICD-10-CM

## 2017-05-12 DIAGNOSIS — E039 Hypothyroidism, unspecified: Secondary | ICD-10-CM

## 2017-05-12 MED ORDER — RANITIDINE HCL 300 MG PO TABS: 300.0000 mg | ORAL_TABLET | Freq: Two times a day (BID) | ORAL | 5 refills | Status: DC

## 2017-05-12 MED ORDER — LEVOTHYROXINE SODIUM 75 MCG PO TABS: 75.0000 ug | ORAL_TABLET | Freq: Every day | ORAL | 0 refills | Status: DC

## 2017-05-12 NOTE — Telephone Encounter (Signed)
Requesting refill on levothyroxine (5 pills left) and ranitidine (3 left). Asking that you please send to [facility].

## 2017-05-22 ENCOUNTER — Encounter: Payer: Self-pay | Admitting: Family Medicine

## 2017-05-22 ENCOUNTER — Ambulatory Visit (INDEPENDENT_AMBULATORY_CARE_PROVIDER_SITE_OTHER): Payer: Medicare HMO | Admitting: Family Medicine

## 2017-05-22 VITALS — BP 136/82 | HR 80 | Temp 98.3°F | Resp 16 | Ht 61.0 in | Wt 135.8 lb

## 2017-05-22 DIAGNOSIS — E039 Hypothyroidism, unspecified: Secondary | ICD-10-CM | POA: Diagnosis not present

## 2017-05-22 DIAGNOSIS — R739 Hyperglycemia, unspecified: Secondary | ICD-10-CM | POA: Diagnosis not present

## 2017-05-22 DIAGNOSIS — K219 Gastro-esophageal reflux disease without esophagitis: Secondary | ICD-10-CM | POA: Diagnosis not present

## 2017-05-22 DIAGNOSIS — E782 Mixed hyperlipidemia: Secondary | ICD-10-CM | POA: Diagnosis not present

## 2017-05-22 DIAGNOSIS — Z79899 Other long term (current) drug therapy: Secondary | ICD-10-CM

## 2017-05-22 DIAGNOSIS — R7303 Prediabetes: Secondary | ICD-10-CM

## 2017-05-22 DIAGNOSIS — D72829 Elevated white blood cell count, unspecified: Secondary | ICD-10-CM | POA: Diagnosis not present

## 2017-05-22 LAB — CBC WITH DIFFERENTIAL/PLATELET
BASOS ABS: 35 {cells}/uL (ref 0–200)
BASOS PCT: 1 %
EOS ABS: 140 {cells}/uL (ref 15–500)
Eosinophils Relative: 4 %
HEMATOCRIT: 37.8 % (ref 35.0–45.0)
Hemoglobin: 12.4 g/dL (ref 11.7–15.5)
LYMPHS PCT: 51 %
Lymphs Abs: 1785 cells/uL (ref 850–3900)
MCH: 29.5 pg (ref 27.0–33.0)
MCHC: 32.8 g/dL (ref 32.0–36.0)
MCV: 90 fL (ref 80.0–100.0)
MONO ABS: 455 {cells}/uL (ref 200–950)
MPV: 10.5 fL (ref 7.5–12.5)
Monocytes Relative: 13 %
NEUTROS PCT: 31 %
Neutro Abs: 1085 cells/uL — ABNORMAL LOW (ref 1500–7800)
Platelets: 209 10*3/uL (ref 140–400)
RBC: 4.2 MIL/uL (ref 3.80–5.10)
RDW: 14.6 % (ref 11.0–15.0)
WBC: 3.5 10*3/uL — ABNORMAL LOW (ref 3.8–10.8)

## 2017-05-22 LAB — COMPLETE METABOLIC PANEL WITH GFR
ALBUMIN: 4.4 g/dL (ref 3.6–5.1)
ALT: 11 U/L (ref 6–29)
AST: 20 U/L (ref 10–35)
Alkaline Phosphatase: 73 U/L (ref 33–130)
BILIRUBIN TOTAL: 0.5 mg/dL (ref 0.2–1.2)
BUN: 10 mg/dL (ref 7–25)
CO2: 29 mmol/L (ref 20–31)
CREATININE: 0.79 mg/dL (ref 0.60–0.93)
Calcium: 9.1 mg/dL (ref 8.6–10.4)
Chloride: 102 mmol/L (ref 98–110)
GFR, Est African American: 87 mL/min (ref >=60)
GFR, Est Non African American: 76 mL/min (ref >=60)
GLUCOSE: 75 mg/dL (ref 65–99)
Potassium: 5.2 mmol/L (ref 3.5–5.3)
SODIUM: 140 mmol/L (ref 135–146)
Total Protein: 7.3 g/dL (ref 6.1–8.1)

## 2017-05-22 LAB — TSH: TSH: 5.31 m[IU]/L — ABNORMAL HIGH

## 2017-05-22 NOTE — Progress Notes (Signed)
Name: Maureen [name]   MRN: [id number]    DOB: 04-Oct-1946   Date:05/22/2017       Progress Note  Subjective  Chief Complaint  Chief Complaint  Patient presents with  . Medication Refill    4 month F/U  . Hypothyroidism    Cold Intolerance  . Gastroesophageal Reflux    Well controlled with medication daily    HPI   GERD: she states taking Ranitidine once daily . She denies heartburn or indigestion. She is avoiding spicy food and tomato paste on her meals, she does like fried food. Discussed to try to wean self off to one pill prn.   Hypothyroidism: weight is stable , back to baseline, taking same dose of levothyroxine. She denies constipation or dry skin.   Pre-diabetes: she has changed her diet, last hgbA1C had improved, she denies polyphagia, polydipsia or polyuria. We will recheck levels.   Hyperlipidemia: on life style modification only, cannot tolerate statin therapy , unable to take aspirin because it caused tinnitus in the past  Long term medication: taking otc supplementation, we will check some labs. Also on Ranitidine daily   Leucopenia: discussed low WBC and we will recheck labs  Patient Active Problem List   Diagnosis Date Noted  . Leucocytosis 05/22/2017  . Allergic rhinitis, seasonal 06/29/2015  . Clavus 06/29/2015  . 1st degree AV block 06/29/2015  . Hammer toe 06/29/2015  . H/O: HTN (hypertension) 06/29/2015  . Blood glucose elevated 06/29/2015  . Floater, vitreous 06/29/2015  . Bilateral tinnitus 06/29/2015  . Hypothyroidism (acquired) 04/02/2015  . Gastroesophageal reflux disease 04/02/2015  . Hammer toe of right foot 04/02/2015  . Scoliosis of thoracic spine 04/02/2015  . Urethral prolapse 04/02/2015  . Corn of toe 04/02/2015  . Hyperlipidemia 04/02/2015  . Varicose veins of both lower extremities 04/02/2015    Past Surgical History:  Procedure Laterality Date  . ABDOMINAL HYSTERECTOMY  1989  . APPENDECTOMY  1989  . BREAST EXCISIONAL  BIOPSY Bilateral    multiple biopsies negative  . BREAST SURGERY     Several  . TUMOR EXCISION  1989   same time as hysterectomy    Family History  Problem Relation Age of Onset  . Diabetes Father 6776       DM complications  . Hypertension Mother [id number]       CKD  . Thyroid disease Mother   . Heart failure Mother   . Kidney disease Mother   . Breast cancer Sister [id number]  . Kidney disease Unknown   . Breast cancer Maternal Aunt   . Breast cancer Paternal Aunt     Social History   Social History  . Marital status: Single    Spouse name: N/A  . Number of children: N/A  . Years of education: N/A   Occupational History  . Not on file.   Social History Main Topics  . Smoking status: Never Smoker  . Smokeless tobacco: Never Used  . Alcohol use 0.0 oz/week     Comment: occasional wine  . Drug use: No  . Sexual activity: Not Currently   Other Topics Concern  . Not on file   Social History Narrative  . No narrative on file     Current Outpatient Prescriptions:  .  b complex vitamins capsule, Take 1 capsule by mouth daily. (Patient taking differently: Take 1 capsule by mouth daily as needed. For vitamin B deficiency.), Disp: 30 capsule, Rfl: 0 .  fluticasone (FLONASE) 50 MCG/ACT nasal  spray, Place 1 spray into both nostrils daily. (Patient taking differently: Place 1 spray into both nostrils daily as needed for allergies or rhinitis. ), Disp: 16 g, Rfl: 2 .  Garlic Oil 1000 MG CAPS, Take 1,000 mg by mouth daily. , Disp: , Rfl:  .  levothyroxine (SYNTHROID, LEVOTHROID) 75 MCG tablet, Take 1 tablet (75 mcg total) by mouth daily., Disp: 30 tablet, Rfl: 0 .  ranitidine (ZANTAC) 300 MG tablet, Take 1 tablet (300 mg total) by mouth 2 (two) times daily., Disp: 60 tablet, Rfl: 5 .  triamcinolone cream (KENALOG) 0.1 %, Apply 1 application topically 2 (two) times daily., Disp: 45 g, Rfl: 0  Allergies  Allergen Reactions  . Aspirin     Tinnitus  . Citalopram Other (See Comments)  .  Codeine Nausea And Vomiting  . Penicillins Rash    Has patient had a PCN reaction causing immediate rash, facial/tongue/throat swelling, SOB or lightheadedness with hypotension: Yes Has patient had a PCN reaction causing severe rash involving mucus membranes or skin necrosis: No Has patient had a PCN reaction that required hospitalization No Has patient had a PCN reaction occurring within the last 10 years: No If all of the above answers are "NO", then may proceed with Cephalosporin use.     ROS  Constitutional: Negative for fever or weight change.  Respiratory: Negative for cough and shortness of breath.   Cardiovascular: Negative for chest pain or palpitations.  Gastrointestinal: Negative for abdominal pain, no bowel changes.  Musculoskeletal: Negative for gait problem or joint swelling.  Skin: Negative for rash.  Neurological: Negative for dizziness or headache.  No other specific complaints in a complete review of systems (except as listed in HPI above).  Objective  Vitals:   05/22/17 0943  BP: 136/82  Pulse: 80  Resp: 16  Temp: 98.3 F (36.8 C)  TempSrc: Oral  SpO2: 99%  Weight: 135 lb 12.8 oz (61.6 kg)  Height: 5\' 1"  (1.549 m)    Body mass index is 25.66 kg/m.  Physical Exam  Constitutional: Patient appears well-developed and well-nourished.  No distress.  HEENT: head atraumatic, normocephalic, pupils equal and reactive to light, neck supple, throat within normal limits, normal thyroid exam Cardiovascular: Normal rate, regular rhythm and normal heart sounds.  No murmur heard. No BLE edema. Pulmonary/Chest: Effort normal and breath sounds normal. No respiratory distress. Abdominal: Soft.  There is no tenderness. Psychiatric: Patient has a normal mood and affect. behavior is normal. Judgment and thought content normal. Muscular Skeletal: scoliosis   PHQ2/9: Depression screen [facility] 2/9 04/08/2017 10/06/2016 08/12/2016 06/02/2016 05/07/2016  Decreased Interest 0 0 0 0  3  Down, Depressed, Hopeless 0 0 0 1 3  PHQ - 2 Score 0 0 0 1 6  Altered sleeping - - - 1 2  Tired, decreased energy - - - 0 2  Change in appetite - - - 0 3  Feeling bad or failure about yourself  - - - 1 1  Trouble concentrating - - - 0 2  Moving slowly or fidgety/restless - - - 0 0  Suicidal thoughts - - - 0 0  PHQ-9 Score - - - 3 16  Difficult doing work/chores - - - Not difficult at all Somewhat difficult     Fall Risk: Fall Risk  04/08/2017 10/06/2016 08/12/2016 06/02/2016 05/07/2016  Falls in the past year? No No No No No     Assessment & Plan  1. Hypothyroidism (acquired)  - TSH  2. Pre-diabetes  On life style modification   3. Mixed hyperlipidemia  She refuses statin   4. Gastroesophageal reflux disease without esophagitis  Advised to try prn medication instead of daily   5. Blood glucose elevated  - Hemoglobin A1c  6. Leukocytosis, unspecified type  - CBC with Differential/Platelet  7. Long-term use of high-risk medication  - COMPLETE METABOLIC PANEL WITH GFR

## 2017-05-23 LAB — HEMOGLOBIN A1C
Hgb A1c MFr Bld: 5.8 % — ABNORMAL HIGH (ref <5.7)
Mean Plasma Glucose: 120 mg/dL

## 2017-05-24 ENCOUNTER — Other Ambulatory Visit: Payer: Self-pay | Admitting: Family Medicine

## 2017-05-24 DIAGNOSIS — D72819 Decreased white blood cell count, unspecified: Secondary | ICD-10-CM | POA: Insufficient documentation

## 2017-05-24 DIAGNOSIS — D708 Other neutropenia: Secondary | ICD-10-CM

## 2017-05-24 DIAGNOSIS — D72829 Elevated white blood cell count, unspecified: Secondary | ICD-10-CM

## 2017-05-25 ENCOUNTER — Telehealth: Payer: Self-pay | Admitting: Family Medicine

## 2017-05-25 ENCOUNTER — Other Ambulatory Visit: Payer: Self-pay

## 2017-05-25 DIAGNOSIS — D708 Other neutropenia: Secondary | ICD-10-CM

## 2017-05-25 DIAGNOSIS — D72829 Elevated white blood cell count, unspecified: Secondary | ICD-10-CM

## 2017-05-25 NOTE — Telephone Encounter (Signed)
PT SAID THAT SOMEONE CALLED HER AND THINKS IT MAY BE ABOUT HER LAB RESULTS. PLEASE CALL HER BACK.

## 2017-06-15 ENCOUNTER — Telehealth: Payer: Self-pay | Admitting: Family Medicine

## 2017-06-15 DIAGNOSIS — E039 Hypothyroidism, unspecified: Secondary | ICD-10-CM

## 2017-06-15 NOTE — Telephone Encounter (Signed)
Pt requesting refill on levothyroxine. She only have one pill left. Please send to [facility]

## 2017-06-16 MED ORDER — LEVOTHYROXINE SODIUM 75 MCG PO TABS: 75.0000 ug | ORAL_TABLET | Freq: Every day | ORAL | 0 refills | Status: DC

## 2017-06-16 NOTE — Telephone Encounter (Signed)
Sending 30 days, but she needs to come in just for TSH recheck in 2 more weeks. Thank you

## 2017-06-16 NOTE — Telephone Encounter (Signed)
LFT MESS TO COME BY AND GET LABS DONE IN 2 WKS PER DR. RX SENT IN FOR 1 MONTH SUPPLY

## 2017-07-07 DIAGNOSIS — D708 Other neutropenia: Secondary | ICD-10-CM | POA: Diagnosis not present

## 2017-07-07 DIAGNOSIS — D72829 Elevated white blood cell count, unspecified: Secondary | ICD-10-CM | POA: Diagnosis not present

## 2017-07-07 DIAGNOSIS — E039 Hypothyroidism, unspecified: Secondary | ICD-10-CM | POA: Diagnosis not present

## 2017-07-07 LAB — CBC WITH DIFFERENTIAL/PLATELET
BASOS ABS: 52 {cells}/uL (ref 0–200)
Basophils Relative: 1.2 %
EOS PCT: 2.3 %
Eosinophils Absolute: 99 cells/uL (ref 15–500)
HEMATOCRIT: 38.8 % (ref 35.0–45.0)
Hemoglobin: 12.9 g/dL (ref 11.7–15.5)
Lymphs Abs: 1987 cells/uL (ref 850–3900)
MCH: 29.3 pg (ref 27.0–33.0)
MCHC: 33.2 g/dL (ref 32.0–36.0)
MCV: 88.2 fL (ref 80.0–100.0)
MONOS PCT: 11.5 %
MPV: 11.3 fL (ref 7.5–12.5)
NEUTROS PCT: 38.8 %
Neutro Abs: 1668 cells/uL (ref 1500–7800)
Platelets: 213 10*3/uL (ref 140–400)
RBC: 4.4 10*6/uL (ref 3.80–5.10)
RDW: 13.4 % (ref 11.0–15.0)
Total Lymphocyte: 46.2 %
WBC mixed population: 495 cells/uL (ref 200–950)
WBC: 4.3 10*3/uL (ref 3.8–10.8)

## 2017-07-07 LAB — TSH: TSH: 9.72 m[IU]/L — AB (ref 0.40–4.50)

## 2017-07-09 ENCOUNTER — Other Ambulatory Visit: Payer: Self-pay | Admitting: Family Medicine

## 2017-07-09 DIAGNOSIS — E039 Hypothyroidism, unspecified: Secondary | ICD-10-CM

## 2017-07-09 MED ORDER — LEVOTHYROXINE SODIUM 75 MCG PO TABS: 75.0000 ug | ORAL_TABLET | Freq: Every day | ORAL | 0 refills | Status: DC

## 2017-07-20 ENCOUNTER — Telehealth: Payer: Self-pay | Admitting: Family Medicine

## 2017-07-20 DIAGNOSIS — E039 Hypothyroidism, unspecified: Secondary | ICD-10-CM

## 2017-07-20 NOTE — Telephone Encounter (Signed)
Spoke with patient and confirmed she needs to come back around October 23 to have her TSH drawn again. Will put lab in as a future order.

## 2017-07-20 NOTE — Telephone Encounter (Signed)
Pt needs clarification. She did lab work on the 11th of September. She think she was told to return 6 weeks from that date to re do her labs (which would be around 23rd of October). She would like to know if this is correct and if so could you mail her the lab order. Please return call for clarification

## 2017-08-04 ENCOUNTER — Other Ambulatory Visit: Payer: Self-pay | Admitting: Family Medicine

## 2017-08-04 DIAGNOSIS — E039 Hypothyroidism, unspecified: Secondary | ICD-10-CM

## 2017-08-04 NOTE — Telephone Encounter (Signed)
Pt requesting refill on levothyroxine. She will be completely out by Thursday morning. Please send to [facility]

## 2017-08-04 NOTE — Telephone Encounter (Signed)
Patient requesting Levothyroxine refills.  Last visit: 05/22/2017  Lab Results  Component Value Date   TSH 9.72 (H) 07/07/2017

## 2017-08-05 MED ORDER — LEVOTHYROXINE SODIUM 75 MCG PO TABS: 75.0000 ug | ORAL_TABLET | Freq: Every day | ORAL | 0 refills | Status: DC

## 2017-08-18 ENCOUNTER — Other Ambulatory Visit: Payer: Self-pay | Admitting: Family Medicine

## 2017-08-18 DIAGNOSIS — E039 Hypothyroidism, unspecified: Secondary | ICD-10-CM

## 2017-08-18 DIAGNOSIS — D72829 Elevated white blood cell count, unspecified: Secondary | ICD-10-CM | POA: Diagnosis not present

## 2017-08-18 DIAGNOSIS — D708 Other neutropenia: Secondary | ICD-10-CM | POA: Diagnosis not present

## 2017-08-18 LAB — CBC WITH DIFFERENTIAL/PLATELET
BASOS PCT: 1.1 %
Basophils Absolute: 52 cells/uL (ref 0–200)
Eosinophils Absolute: 132 cells/uL (ref 15–500)
Eosinophils Relative: 2.8 %
HEMATOCRIT: 37.9 % (ref 35.0–45.0)
Hemoglobin: 12.5 g/dL (ref 11.7–15.5)
Lymphs Abs: 2214 cells/uL (ref 850–3900)
MCH: 29 pg (ref 27.0–33.0)
MCHC: 33 g/dL (ref 32.0–36.0)
MCV: 87.9 fL (ref 80.0–100.0)
MPV: 11.6 fL (ref 7.5–12.5)
Monocytes Relative: 10.3 %
Neutro Abs: 1819 cells/uL (ref 1500–7800)
Neutrophils Relative %: 38.7 %
PLATELETS: 193 10*3/uL (ref 140–400)
RBC: 4.31 10*6/uL (ref 3.80–5.10)
RDW: 13.2 % (ref 11.0–15.0)
Total Lymphocyte: 47.1 %
WBC: 4.7 10*3/uL (ref 3.8–10.8)
WBCMIX: 484 {cells}/uL (ref 200–950)

## 2017-08-18 LAB — TSH: TSH: 4.75 mIU/L — ABNORMAL HIGH (ref 0.40–4.50)

## 2017-08-18 MED ORDER — LEVOTHYROXINE SODIUM 88 MCG PO TABS: 88.0000 ug | ORAL_TABLET | Freq: Every day | ORAL | 1 refills | Status: DC

## 2017-08-20 ENCOUNTER — Telehealth: Payer: Self-pay | Admitting: Family Medicine

## 2017-08-20 NOTE — Telephone Encounter (Signed)
Copied from CRM #[id number]. Topic: General - Other >> Aug 20, 2017 10:49 AM Arlyss Gandyichardson, Taren N, NT wrote: Reason for CRM: Patient called and stated on Monday she received lab work but has not heard the results but that [facility] letting her know she had medicine to pick up for her thyroid. She is wanting to know why no one contacted her about this. Please give pt a call. May leave a detailed message.

## 2017-08-21 ENCOUNTER — Ambulatory Visit: Payer: Self-pay | Admitting: *Deleted

## 2017-08-21 NOTE — Telephone Encounter (Signed)
Pt. Called again in regard to her lab results.

## 2017-08-21 NOTE — Telephone Encounter (Signed)
Called pt, no answer and could not leave a message.

## 2017-08-21 NOTE — Telephone Encounter (Signed)
Patient called back, missed earlier call. She is very upset and wants to speak to nurse or Dr. Carlynn PurlSowles.

## 2017-08-21 NOTE — Telephone Encounter (Signed)
Called patient she is taking Levothyroxine as prescribed. She will pick up new dose from pharmacy and start taking medication daily as prescribed. She will follow up next month.

## 2017-09-09 ENCOUNTER — Telehealth: Payer: Self-pay | Admitting: Family Medicine

## 2017-09-09 ENCOUNTER — Other Ambulatory Visit: Payer: Self-pay

## 2017-09-09 DIAGNOSIS — E039 Hypothyroidism, unspecified: Secondary | ICD-10-CM

## 2017-09-09 DIAGNOSIS — K219 Gastro-esophageal reflux disease without esophagitis: Secondary | ICD-10-CM

## 2017-09-09 NOTE — Telephone Encounter (Signed)
Refill request for thyroid medication: Levothyroxine 88 MCG  Last Physical: 04/08/2017  Lab Results  Component Value Date   TSH 4.75 (H) 08/18/2017   Follow up visit:   09/22/2017

## 2017-09-09 NOTE — Telephone Encounter (Signed)
Copied from CRM [phone]. Topic: Inquiry >> Sep 09, 2017 10:56 AM Anice PaganiniMunoz, Cruz I, NT wrote: Reason for CRM: pt call for Rx Refill Zantac 300 MG SYNTHROID 88 MG she [facility] [place] [phone]

## 2017-09-09 NOTE — Telephone Encounter (Unsigned)
Copied from CRM #[id number]. Topic: Inquiry °>> Sep 09, 2017 10:56 AM Shenice Dolder I, NT wrote: °Reason for CRM: pt call for Rx Refill Zantac 300 MG SYNTHROID 88 MG she use [facility] @[address] [place] [phone]   ° °

## 2017-09-09 NOTE — Telephone Encounter (Signed)
Called [facility] and both prescriptions are filled and ready for her to pick up. The Zantac prescription still has 3 refills left and Synthroid has no refills remaining after this one is picked up. But patient as an upcoming appointment with Dr. Carlynn PurlSowles this month for her TSH medication.

## 2017-09-10 MED ORDER — LEVOTHYROXINE SODIUM 88 MCG PO TABS: 88.0000 ug | ORAL_TABLET | Freq: Every day | ORAL | 0 refills | Status: DC

## 2017-09-10 MED ORDER — RANITIDINE HCL 300 MG PO TABS: 300.0000 mg | ORAL_TABLET | Freq: Two times a day (BID) | ORAL | 5 refills | Status: DC

## 2017-09-22 ENCOUNTER — Ambulatory Visit: Payer: Medicare HMO | Admitting: Family Medicine

## 2017-09-22 ENCOUNTER — Encounter: Payer: Self-pay | Admitting: Family Medicine

## 2017-09-22 VITALS — BP 118/74 | HR 88 | Temp 98.3°F | Resp 18 | Ht 61.0 in | Wt 136.3 lb

## 2017-09-22 DIAGNOSIS — E039 Hypothyroidism, unspecified: Secondary | ICD-10-CM

## 2017-09-22 DIAGNOSIS — K219 Gastro-esophageal reflux disease without esophagitis: Secondary | ICD-10-CM

## 2017-09-22 DIAGNOSIS — R7303 Prediabetes: Secondary | ICD-10-CM

## 2017-09-22 DIAGNOSIS — E782 Mixed hyperlipidemia: Secondary | ICD-10-CM

## 2017-09-22 DIAGNOSIS — Z23 Encounter for immunization: Secondary | ICD-10-CM | POA: Diagnosis not present

## 2017-09-22 LAB — TSH: TSH: 3.22 mIU/L (ref 0.40–4.50)

## 2017-09-22 NOTE — Progress Notes (Signed)
Name: Maureen [name]   MRN: [id number]    DOB: 11-22-1945   Date:09/22/2017       Progress Note  Subjective  Chief Complaint  Chief Complaint  Patient presents with  . Medication Refill    4 month F/U  . Hypothyroidism  . Gastroesophageal Reflux    Well controlled with taking medication one time daily  . Hyperlipidemia  . Leucopenia    HPI  GERD: she states taking Ranitidine once daily . She denies heartburn or indigestion. She is avoiding spicy food and tomato paste on her meals, she does like fried food. She is down to one Ranitidine daily and symptoms are controlled, discussed trying to take it prn   Hypothyroidism: weight is stable , back to baseline, taking same dose of levothyroxine. She denies constipation, hair loss  or dry skin.   Pre-diabetes: she has changed her diet, last hgbA1C had improved, she denies polyphagia, polydipsia or polyuria.   Hyperlipidemia: on life style modification only, cannot tolerate statin therapy , unable to take aspirin because it caused tinnitus in the past  Leucopenia: last labs it was back to normal   Patient Active Problem List   Diagnosis Date Noted  . Leukopenia 05/24/2017  . Leucocytosis 05/22/2017  . Allergic rhinitis, seasonal 06/29/2015  . Clavus 06/29/2015  . 1st degree AV block 06/29/2015  . Hammer toe 06/29/2015  . H/O: HTN (hypertension) 06/29/2015  . Blood glucose elevated 06/29/2015  . Floater, vitreous 06/29/2015  . Bilateral tinnitus 06/29/2015  . Hypothyroidism (acquired) 04/02/2015  . Gastroesophageal reflux disease 04/02/2015  . Hammer toe of right foot 04/02/2015  . Scoliosis of thoracic spine 04/02/2015  . Urethral prolapse 04/02/2015  . Corn of toe 04/02/2015  . Hyperlipidemia 04/02/2015  . Varicose veins of both lower extremities 04/02/2015    Past Surgical History:  Procedure Laterality Date  . ABDOMINAL HYSTERECTOMY  1989  . APPENDECTOMY  1989  . BREAST EXCISIONAL BIOPSY Bilateral    multiple  biopsies negative  . BREAST SURGERY     Several  . TUMOR EXCISION  1989   same time as hysterectomy    Family History  Problem Relation Age of Onset  . Diabetes Father 5476       DM complications  . Hypertension Mother 3279       CKD  . Thyroid disease Mother   . Heart failure Mother   . Kidney disease Mother   . Breast cancer Sister 6253  . Kidney disease Unknown   . Breast cancer Maternal Aunt   . Breast cancer Paternal Aunt     Social History   Socioeconomic History  . Marital status: Single    Spouse name: Not on file  . Number of children: Not on file  . Years of education: Not on file  . Highest education level: Not on file  Social Needs  . Financial resource strain: Not on file  . Food insecurity - worry: Not on file  . Food insecurity - inability: Not on file  . Transportation needs - medical: Not on file  . Transportation needs - non-medical: Not on file  Occupational History  . Not on file  Tobacco Use  . Smoking status: Never Smoker  . Smokeless tobacco: Never Used  Substance and Sexual Activity  . Alcohol use: Yes    Alcohol/week: 0.0 oz    Comment: occasional wine  . Drug use: No  . Sexual activity: Not Currently  Other Topics Concern  . Not  on file  Social History Narrative  . Not on file     Current Outpatient Medications:  .  albuterol (PROVENTIL HFA) 108 (90 Base) MCG/ACT inhaler, Inhale into the lungs., Disp: , Rfl:  .  b complex vitamins capsule, Take 1 capsule by mouth daily. (Patient taking differently: Take 1 capsule by mouth daily as needed. For vitamin B deficiency.), Disp: 30 capsule, Rfl: 0 .  fluticasone (FLONASE) 50 MCG/ACT nasal spray, Place 1 spray into both nostrils daily. (Patient taking differently: Place 1 spray into both nostrils daily as needed for allergies or rhinitis. ), Disp: 16 g, Rfl: 2 .  Garlic Oil 1000 MG CAPS, Take 1,000 mg by mouth daily. , Disp: , Rfl:  .  levothyroxine (SYNTHROID, LEVOTHROID) 88 MCG tablet, Take 1  tablet (88 mcg total) daily by mouth., Disp: 30 tablet, Rfl: 0 .  ranitidine (ZANTAC) 300 MG tablet, Take 1 tablet (300 mg total) 2 (two) times daily by mouth. (Patient taking differently: Take 300 mg by mouth at bedtime. ), Disp: 60 tablet, Rfl: 5 .  triamcinolone cream (KENALOG) 0.1 %, Apply 1 application topically 2 (two) times daily., Disp: 45 g, Rfl: 0  Allergies  Allergen Reactions  . Aspirin     Tinnitus  . Citalopram Other (See Comments)  . Codeine Nausea And Vomiting  . Penicillins Rash    Has patient had a PCN reaction causing immediate rash, facial/tongue/throat swelling, SOB or lightheadedness with hypotension: Yes Has patient had a PCN reaction causing severe rash involving mucus membranes or skin necrosis: No Has patient had a PCN reaction that required hospitalization No Has patient had a PCN reaction occurring within the last 10 years: No If all of the above answers are "NO", then may proceed with Cephalosporin use.     ROS  Constitutional: Negative for fever or weight change.  Respiratory: Negative for cough and shortness of breath.   Cardiovascular: Negative for chest pain or palpitations.  Gastrointestinal: Negative for abdominal pain, no bowel changes.  Musculoskeletal: Negative for gait problem or joint swelling.  Skin: Negative for rash.  Neurological: Negative for dizziness or headache.  No other specific complaints in a complete review of systems (except as listed in HPI above).  Objective  Vitals:   09/22/17 0946  BP: 118/74  Pulse: 88  Resp: 18  Temp: 98.3 F (36.8 C)  TempSrc: Oral  SpO2: 97%  Weight: 136 lb 4.8 oz (61.8 kg)  Height: 5\' 1"  (1.549 m)    Body mass index is 25.75 kg/m.  Physical Exam  Constitutional: Patient appears well-developed and well-nourished.  No distress.  HEENT: head atraumatic, normocephalic, pupils equal and reactive to light,  neck supple, throat within normal limits, no thyromegaly  Cardiovascular: Normal rate,  regular rhythm and normal heart sounds.  No murmur heard. No BLE edema. Pulmonary/Chest: Effort normal and breath sounds normal. No respiratory distress. Abdominal: Soft.  There is no tenderness. Psychiatric: Patient has a normal mood and affect. behavior is normal. Judgment and thought content normal.  Recent Results (from the past 2160 hour(s))  TSH     Status: Abnormal   Collection Time: 07/07/17  9:04 AM  Result Value Ref Range   TSH 9.72 (H) 0.40 - 4.50 mIU/L  CBC with Differential/Platelet     Status: None   Collection Time: 07/07/17  9:04 AM  Result Value Ref Range   WBC 4.3 3.8 - 10.8 Thousand/uL   RBC 4.40 3.80 - 5.10 Million/uL   Hemoglobin 12.9 11.7 -  15.5 g/dL   HCT 16.1 09.6 - 04.5 %   MCV 88.2 80.0 - 100.0 fL   MCH 29.3 27.0 - 33.0 pg   MCHC 33.2 32.0 - 36.0 g/dL   RDW 40.9 81.1 - 91.4 %   Platelets 213 140 - 400 Thousand/uL   MPV 11.3 7.5 - 12.5 fL   Neutro Abs 1,668 1,500 - 7,800 cells/uL   Lymphs Abs 1,987 850 - 3,900 cells/uL   WBC mixed population 495 200 - 950 cells/uL   Eosinophils Absolute 99 15 - 500 cells/uL   Basophils Absolute 52 0 - 200 cells/uL   Neutrophils Relative % 38.8 %   Total Lymphocyte 46.2 %   Monocytes Relative 11.5 %   Eosinophils Relative 2.3 %   Basophils Relative 1.2 %  TSH     Status: Abnormal   Collection Time: 08/18/17  9:18 AM  Result Value Ref Range   TSH 4.75 (H) 0.40 - 4.50 mIU/L  CBC with Differential/Platelet     Status: None   Collection Time: 08/18/17  9:18 AM  Result Value Ref Range   WBC 4.7 3.8 - 10.8 Thousand/uL   RBC 4.31 3.80 - 5.10 Million/uL   Hemoglobin 12.5 11.7 - 15.5 g/dL   HCT 78.2 95.6 - 21.3 %   MCV 87.9 80.0 - 100.0 fL   MCH 29.0 27.0 - 33.0 pg   MCHC 33.0 32.0 - 36.0 g/dL   RDW 08.6 57.8 - 46.9 %   Platelets 193 140 - 400 Thousand/uL   MPV 11.6 7.5 - 12.5 fL   Neutro Abs 1,819 1,500 - 7,800 cells/uL   Lymphs Abs 2,214 850 - 3,900 cells/uL   WBC mixed population 484 200 - 950 cells/uL    Eosinophils Absolute 132 15 - 500 cells/uL   Basophils Absolute 52 0 - 200 cells/uL   Neutrophils Relative % 38.7 %   Total Lymphocyte 47.1 %   Monocytes Relative 10.3 %   Eosinophils Relative 2.8 %   Basophils Relative 1.1 %      PHQ2/9: Depression screen [facility] 2/9 09/22/2017 04/08/2017 10/06/2016 08/12/2016 06/02/2016  Decreased Interest 0 0 0 0 0  Down, Depressed, Hopeless 0 0 0 0 1  PHQ - 2 Score 0 0 0 0 1  Altered sleeping - - - - 1  Tired, decreased energy - - - - 0  Change in appetite - - - - 0  Feeling bad or failure about yourself  - - - - 1  Trouble concentrating - - - - 0  Moving slowly or fidgety/restless - - - - 0  Suicidal thoughts - - - - 0  PHQ-9 Score - - - - 3  Difficult doing work/chores - - - - Not difficult at all     Fall Risk: Fall Risk  09/22/2017 04/08/2017 10/06/2016 08/12/2016 06/02/2016  Falls in the past year? No No No No No     Functional Status Survey: Is the patient deaf or have difficulty hearing?: No Does the patient have difficulty seeing, even when wearing glasses/contacts?: No Does the patient have difficulty concentrating, remembering, or making decisions?: No Does the patient have difficulty walking or climbing stairs?: No Does the patient have difficulty dressing or bathing?: No Does the patient have difficulty doing errands alone such as visiting a doctor's office or shopping?: No    Assessment & Plan  1. Hypothyroidism (acquired)  - TSH  2. Need for immunization against influenza  Declined  3. Pre-diabetes  On life  style modification   4. Mixed hyperlipidemia  Could not tolerate statin therapy   5. Gastroesophageal reflux disease without esophagitis  Under control, taking only one po qhs

## 2017-10-29 ENCOUNTER — Other Ambulatory Visit: Payer: Self-pay | Admitting: Family Medicine

## 2017-10-29 DIAGNOSIS — Z1231 Encounter for screening mammogram for malignant neoplasm of breast: Secondary | ICD-10-CM

## 2017-11-06 ENCOUNTER — Encounter: Payer: Self-pay | Admitting: Family Medicine

## 2017-11-06 ENCOUNTER — Ambulatory Visit (INDEPENDENT_AMBULATORY_CARE_PROVIDER_SITE_OTHER): Payer: Medicare HMO | Admitting: Family Medicine

## 2017-11-06 VITALS — BP 130/80 | HR 88 | Temp 99.4°F | Resp 14 | Wt 135.7 lb

## 2017-11-06 DIAGNOSIS — J302 Other seasonal allergic rhinitis: Secondary | ICD-10-CM | POA: Diagnosis not present

## 2017-11-06 DIAGNOSIS — J40 Bronchitis, not specified as acute or chronic: Secondary | ICD-10-CM | POA: Diagnosis not present

## 2017-11-06 DIAGNOSIS — J069 Acute upper respiratory infection, unspecified: Secondary | ICD-10-CM

## 2017-11-06 MED ORDER — PREDNISONE 20 MG PO TABS: 20.0000 mg | ORAL_TABLET | Freq: Every day | ORAL | 0 refills | Status: DC

## 2017-11-06 MED ORDER — FLUTICASONE PROPIONATE 50 MCG/ACT NA SUSP: 1.0000 | Freq: Every day | NASAL | 2 refills | Status: DC

## 2017-11-06 MED ORDER — ALBUTEROL SULFATE HFA 108 (90 BASE) MCG/ACT IN AERS: 2.0000 | INHALATION_SPRAY | RESPIRATORY_TRACT | 0 refills | Status: DC | PRN

## 2017-11-06 MED ORDER — BENZONATATE 100 MG PO CAPS: 100.0000 mg | ORAL_CAPSULE | Freq: Two times a day (BID) | ORAL | 0 refills | Status: DC | PRN

## 2017-11-06 NOTE — Progress Notes (Signed)
Name: Maureen [name]   MRN: [id number]    DOB: [birth year]   Date:11/06/2017       Progress Note  Subjective  Chief Complaint  Chief Complaint  Patient presents with  . URI    HPI  URI/cough: she states that cold symptoms started 6 days ago with scratchy throat, rhinorrhea and sneezing, however last night started to have a dry cough, wheezing and some chills. She denies fever, nausea, vomiting, rashes or SOB. She is taking otc medication. She denies change in appetite or rashes.    Patient Active Problem List   Diagnosis Date Noted  . Leukopenia 05/24/2017  . Allergic rhinitis, seasonal 06/29/2015  . Clavus 06/29/2015  . 1st degree AV block 06/29/2015  . Hammer toe 06/29/2015  . H/O: HTN (hypertension) 06/29/2015  . Blood glucose elevated 06/29/2015  . Floater, vitreous 06/29/2015  . Bilateral tinnitus 06/29/2015  . Hypothyroidism (acquired) 04/02/2015  . Gastroesophageal reflux disease 04/02/2015  . Hammer toe of right foot 04/02/2015  . Scoliosis of thoracic spine 04/02/2015  . Urethral prolapse 04/02/2015  . Corn of toe 04/02/2015  . Hyperlipidemia 04/02/2015  . Varicose veins of both lower extremities 04/02/2015    Past Surgical History:  Procedure Laterality Date  . ABDOMINAL HYSTERECTOMY  1989  . APPENDECTOMY  1989  . BREAST EXCISIONAL BIOPSY Bilateral    multiple biopsies negative  . BREAST SURGERY     Several  . TUMOR EXCISION  1989   same time as hysterectomy    Family History  Problem Relation Age of Onset  . Diabetes Father 8076       DM complications  . Hypertension Mother 8479       CKD  . Thyroid disease Mother   . Heart failure Mother   . Kidney disease Mother   . Breast cancer Sister 5953  . Kidney disease Unknown   . Breast cancer Maternal Aunt   . Breast cancer Paternal Aunt     Social History   Socioeconomic History  . Marital status: Single    Spouse name: Not on file  . Number of children: Not on file  . Years of education: Not on  file  . Highest education level: Not on file  Social Needs  . Financial resource strain: Not on file  . Food insecurity - worry: Not on file  . Food insecurity - inability: Not on file  . Transportation needs - medical: Not on file  . Transportation needs - non-medical: Not on file  Occupational History  . Not on file  Tobacco Use  . Smoking status: Never Smoker  . Smokeless tobacco: Never Used  Substance and Sexual Activity  . Alcohol use: Yes    Alcohol/week: 0.0 oz    Comment: occasional wine  . Drug use: No  . Sexual activity: Not Currently  Other Topics Concern  . Not on file  Social History Narrative  . Not on file     Current Outpatient Medications:  .  albuterol (PROVENTIL HFA) 108 (90 Base) MCG/ACT inhaler, Inhale 2 puffs into the lungs every 4 (four) hours as needed for wheezing or shortness of breath., Disp: 1 Inhaler, Rfl: 0 .  b complex vitamins capsule, Take 1 capsule by mouth daily. (Patient taking differently: Take 1 capsule by mouth daily as needed. For vitamin B deficiency.), Disp: 30 capsule, Rfl: 0 .  benzonatate (TESSALON) 100 MG capsule, Take 1-2 capsules (100-200 mg total) by mouth 2 (two) times daily as needed., Disp:  40 capsule, Rfl: 0 .  fluticasone (FLONASE) 50 MCG/ACT nasal spray, Place 1 spray into both nostrils daily. (Patient taking differently: Place 1 spray into both nostrils daily as needed for allergies or rhinitis. ), Disp: 16 g, Rfl: 2 .  Garlic Oil 1000 MG CAPS, Take 1,000 mg by mouth daily. , Disp: , Rfl:  .  levothyroxine (SYNTHROID, LEVOTHROID) 88 MCG tablet, Take 1 tablet (88 mcg total) daily by mouth., Disp: 30 tablet, Rfl: 0 .  predniSONE (DELTASONE) 20 MG tablet, Take 1 tablet (20 mg total) by mouth daily with breakfast., Disp: 10 tablet, Rfl: 0 .  ranitidine (ZANTAC) 300 MG tablet, Take 1 tablet (300 mg total) 2 (two) times daily by mouth. (Patient taking differently: Take 300 mg by mouth at bedtime. ), Disp: 60 tablet, Rfl: 5 .   triamcinolone cream (KENALOG) 0.1 %, Apply 1 application topically 2 (two) times daily., Disp: 45 g, Rfl: 0  Allergies  Allergen Reactions  . Aspirin     Tinnitus  . Citalopram Other (See Comments)  . Codeine Nausea And Vomiting  . Penicillins Rash    Has patient had a PCN reaction causing immediate rash, facial/tongue/throat swelling, SOB or lightheadedness with hypotension: Yes Has patient had a PCN reaction causing severe rash involving mucus membranes or skin necrosis: No Has patient had a PCN reaction that required hospitalization No Has patient had a PCN reaction occurring within the last 10 years: No If all of the above answers are "NO", then may proceed with Cephalosporin use.     ROS  Ten systems reviewed and is negative except as mentioned in HPI   Objective  Vitals:   11/06/17 1531  BP: 130/80  Pulse: 88  Resp: 14  Temp: 99.4 F (37.4 C)  TempSrc: Oral  SpO2: 98%  Weight: 135 lb 11.2 oz (61.6 kg)    Body mass index is 25.64 kg/m.  Physical Exam  Constitutional: Patient appears well-developed and well-nourished.  No distress.  HEENT: head atraumatic, normocephalic, pupils equal and reactive to light, ears normal TM bilaterally,  neck supple, throat within normal limits Cardiovascular: Normal rate, regular rhythm and normal heart sounds.  No murmur heard. No BLE edema. Scoliosis  Pulmonary/Chest: Effort normal and breath sounds normal. No respiratory distress. Abdominal: Soft.  There is no tenderness. Psychiatric: Patient has a normal mood and affect. behavior is normal. Judgment and thought content normal.   Recent Results (from the past 2160 hour(s))  TSH     Status: Abnormal   Collection Time: 08/18/17  9:18 AM  Result Value Ref Range   TSH 4.75 (H) 0.40 - 4.50 mIU/L  CBC with Differential/Platelet     Status: None   Collection Time: 08/18/17  9:18 AM  Result Value Ref Range   WBC 4.7 3.8 - 10.8 Thousand/uL   RBC 4.31 3.80 - 5.10 Million/uL    Hemoglobin 12.5 11.7 - 15.5 g/dL   HCT 16.1 09.6 - 04.5 %   MCV 87.9 80.0 - 100.0 fL   MCH 29.0 27.0 - 33.0 pg   MCHC 33.0 32.0 - 36.0 g/dL   RDW 40.9 81.1 - 91.4 %   Platelets 193 140 - 400 Thousand/uL   MPV 11.6 7.5 - 12.5 fL   Neutro Abs 1,819 1,500 - 7,800 cells/uL   Lymphs Abs 2,214 850 - 3,900 cells/uL   WBC mixed population 484 200 - 950 cells/uL   Eosinophils Absolute 132 15 - 500 cells/uL   Basophils Absolute 52 0 - 200  cells/uL   Neutrophils Relative % 38.7 %   Total Lymphocyte 47.1 %   Monocytes Relative 10.3 %   Eosinophils Relative 2.8 %   Basophils Relative 1.1 %  TSH     Status: None   Collection Time: 09/22/17 10:32 AM  Result Value Ref Range   TSH 3.22 0.40 - 4.50 mIU/L      PHQ2/9: Depression screen [facility] 2/9 09/22/2017 04/08/2017 10/06/2016 08/12/2016 06/02/2016  Decreased Interest 0 0 0 0 0  Down, Depressed, Hopeless 0 0 0 0 1  PHQ - 2 Score 0 0 0 0 1  Altered sleeping - - - - 1  Tired, decreased energy - - - - 0  Change in appetite - - - - 0  Feeling bad or failure about yourself  - - - - 1  Trouble concentrating - - - - 0  Moving slowly or fidgety/restless - - - - 0  Suicidal thoughts - - - - 0  PHQ-9 Score - - - - 3  Difficult doing work/chores - - - - Not difficult at all     Fall Risk: Fall Risk  09/22/2017 04/08/2017 10/06/2016 08/12/2016 06/02/2016  Falls in the past year? No No No No No      Assessment & Plan  1. Acute URI  Continue otc medication  2. Bronchitis  Explained no need for antibiotics.  - albuterol (PROVENTIL HFA) 108 (90 Base) MCG/ACT inhaler; Inhale 2 puffs into the lungs every 4 (four) hours as needed for wheezing or shortness of breath.  Dispense: 1 Inhaler; Refill: 0 - predniSONE (DELTASONE) 20 MG tablet; Take 1 tablet (20 mg total) by mouth daily with breakfast.  Dispense: 10 tablet; Refill: 0 - benzonatate (TESSALON) 100 MG capsule; Take 1-2 capsules (100-200 mg total) by mouth 2 (two) times daily as needed.   Dispense: 40 capsule; Refill: 0

## 2017-11-09 ENCOUNTER — Telehealth: Payer: Self-pay

## 2017-11-09 ENCOUNTER — Other Ambulatory Visit: Payer: Self-pay | Admitting: Family Medicine

## 2017-11-09 MED ORDER — ALBUTEROL SULFATE HFA 108 (90 BASE) MCG/ACT IN AERS: 2.0000 | INHALATION_SPRAY | Freq: Four times a day (QID) | RESPIRATORY_TRACT | 0 refills | Status: DC | PRN

## 2017-11-09 NOTE — Telephone Encounter (Signed)
[facility] faxed us that patient's Insurance does not cover Proventil. So the pharmacist wanted to know if they could change to Proair or Ventolin? Please send new prescription due to non-coverage of insurance with Proventil. Thanks

## 2017-11-17 ENCOUNTER — Other Ambulatory Visit: Payer: Self-pay | Admitting: Family Medicine

## 2017-11-17 DIAGNOSIS — E039 Hypothyroidism, unspecified: Secondary | ICD-10-CM

## 2017-11-17 NOTE — Telephone Encounter (Signed)
Refill request for thyroid medication. [facility]othyroxine to [facility].  Last Visit: 09/22/2017  Lab Results  Component Value Date   TSH 3.22 09/22/2017    Follow up on 12/29/2017

## 2017-11-24 ENCOUNTER — Telehealth: Payer: Self-pay | Admitting: Family Medicine

## 2017-11-24 NOTE — Telephone Encounter (Unsigned)
Copied from CRM [phone]#[id number]. Topic: Inquiry >> Nov 24, 2017 11:18 AM Raquel SarnaHayes, Teresa G wrote: Levoxine 88mg   is on backorder at pt's pharmacy.  She is taking the same "old" thyroid medication at 75mg .  Please call pt to discuss what she needs to do since it was recently upped.  [facility] [address] - , [place] - [address] [place] [id number] Phone: [phone] Fax: [phone]

## 2017-11-24 NOTE — Telephone Encounter (Signed)
Yes, she is supposed to take 78 mcg daily and twice a week take one a half pill

## 2017-11-24 NOTE — Telephone Encounter (Signed)
Left voicemail to notify patient how to take dosage of Levothyroxine 75 mcg once daily and 1 and half Tuesday and Thursday.

## 2017-11-30 ENCOUNTER — Other Ambulatory Visit: Payer: Self-pay

## 2017-11-30 ENCOUNTER — Other Ambulatory Visit: Payer: Self-pay | Admitting: Family Medicine

## 2017-11-30 DIAGNOSIS — E039 Hypothyroidism, unspecified: Secondary | ICD-10-CM

## 2017-12-03 MED ORDER — LEVOTHYROXINE SODIUM 75 MCG PO TABS: 88.0000 ug | ORAL_TABLET | Freq: Every day | ORAL | 0 refills | Status: DC

## 2017-12-07 ENCOUNTER — Ambulatory Visit
Admission: RE | Admit: 2017-12-07 | Discharge: 2017-12-07 | Disposition: A | Discharge status: Home / Self Care | Payer: Medicare HMO | Source: Ambulatory Visit | Attending: Family Medicine | Admitting: Family Medicine

## 2017-12-07 DIAGNOSIS — Z1231 Encounter for screening mammogram for malignant neoplasm of breast: Secondary | ICD-10-CM | POA: Diagnosis not present

## 2017-12-08 ENCOUNTER — Telehealth: Payer: Self-pay

## 2017-12-08 ENCOUNTER — Other Ambulatory Visit: Payer: Self-pay | Admitting: Family Medicine

## 2017-12-08 DIAGNOSIS — E039 Hypothyroidism, unspecified: Secondary | ICD-10-CM

## 2017-12-08 MED ORDER — LEVOTHYROXINE SODIUM 75 MCG PO TABS: 75.0000 ug | ORAL_TABLET | Freq: Every day | ORAL | 0 refills | Status: DC

## 2017-12-08 NOTE — Telephone Encounter (Signed)
[facility] is asking if it is ok to change to a different manufacturer due to narrow therapeutic drug choice. Dr. Carlynn PurlSowles states it would be ok verbally just need to recheck labs in 6 weeks due to medication manufacturer change can affect TSH levels. Patient aware by phone and will mail out lab slip with reminder to recheck around January 19, 2018.

## 2017-12-08 NOTE — Telephone Encounter (Signed)
Pt called to inform that she doesn't mind changing to the [facility] [address], contact pt to advise

## 2017-12-08 NOTE — Telephone Encounter (Signed)
We have two directions 75 mcg daily and one and half twice a week and 88 mcg  On her chart, we need to continue same dose. Can you please verify how she is taking it?

## 2017-12-09 ENCOUNTER — Other Ambulatory Visit: Payer: Self-pay | Admitting: Family Medicine

## 2017-12-09 NOTE — Telephone Encounter (Addendum)
On 11/24/17 at 5:10 p.m. Dr. Carlynn PurlSowles gave me verbal confirmation on how to instruct the patient to take her medication:   Left voicemail to notify patient how to take dosage of Levothyroxine 75 mcg once daily and 1 and half Tuesday and Thursday.   Patient confirmed this is her dosage and how she takes it.   On the telephone encounter from 11/24/17 at 2:02 p.m. Patient called to inform us that Levothyroxine 88 mcg was on backorder and you were asked to call and advised what to do.

## 2017-12-10 NOTE — Telephone Encounter (Signed)
Patient states with all the confusion [facility] is giving her the next time she refills her Thyroid medication she would like [facility] and be the 88 mcg. Since it is much easier for her to remember to take one a day of the 88 mcg instead of the 75 mcg 1 and a half on Tues and Thursdays and on the rest of the days. Patient is tired of [facility] never having the right manufacture or dosage of her mediation. But did state [facility] and [facility] 12/08/17.

## 2017-12-10 NOTE — Telephone Encounter (Signed)
Pt called to state to make sure in the future her levothyroxine (SYNTHROID, LEVOTHROID) 88 MCG tablet [[id number]][[id number]]   [facility], pt as well wanted to speak w/ a nurse not specified exactly about what but contact pt when necessary to advise

## 2017-12-24 ENCOUNTER — Telehealth: Payer: Self-pay

## 2017-12-24 DIAGNOSIS — E039 Hypothyroidism, unspecified: Secondary | ICD-10-CM

## 2017-12-24 NOTE — Telephone Encounter (Signed)
I need her to come in for labs before I change it to 88. How long has she been taking 75? Labs already ordered

## 2017-12-24 NOTE — Telephone Encounter (Signed)
Copied from CRM [id number]. Topic: Quick Communication - Office Called Patient >> Dec 10, 2017  2:21 PM Tommie RaymondBooker, Crystal L, [place] wrote: Called patient to find out what was wrong with her levothyroxine prescription. Her dose was changed because the dose she was taking was no longer available from manufacturer. She called back about pharmacy change, but we still need more clarification as to what she needs.  >> Dec 22, 2017  3:32 PM Louie BunPalacios Medina, Rosey Batheresa D wrote: Patient call upset and said that she needs her medication [facility]roxine (SYNTHROID, LEVOTHROID) 75 MCG tablet but instead of it being 75mcg for it to be 88mg . She said that she feels like she has been getting the round around and her meds have not been send. Please call patient back, thanks.

## 2017-12-29 ENCOUNTER — Encounter: Payer: Self-pay | Admitting: Family Medicine

## 2017-12-29 ENCOUNTER — Ambulatory Visit (INDEPENDENT_AMBULATORY_CARE_PROVIDER_SITE_OTHER): Payer: Medicare HMO | Admitting: Family Medicine

## 2017-12-29 VITALS — BP 110/80 | HR 88 | Resp 14 | Ht 61.0 in | Wt 137.9 lb

## 2017-12-29 DIAGNOSIS — F325 Major depressive disorder, single episode, in full remission: Secondary | ICD-10-CM | POA: Diagnosis not present

## 2017-12-29 DIAGNOSIS — Z79899 Other long term (current) drug therapy: Secondary | ICD-10-CM | POA: Diagnosis not present

## 2017-12-29 DIAGNOSIS — F330 Major depressive disorder, recurrent, mild: Secondary | ICD-10-CM | POA: Insufficient documentation

## 2017-12-29 DIAGNOSIS — R7303 Prediabetes: Secondary | ICD-10-CM | POA: Diagnosis not present

## 2017-12-29 DIAGNOSIS — R739 Hyperglycemia, unspecified: Secondary | ICD-10-CM

## 2017-12-29 DIAGNOSIS — E782 Mixed hyperlipidemia: Secondary | ICD-10-CM

## 2017-12-29 DIAGNOSIS — Z23 Encounter for immunization: Secondary | ICD-10-CM

## 2017-12-29 DIAGNOSIS — K219 Gastro-esophageal reflux disease without esophagitis: Secondary | ICD-10-CM | POA: Diagnosis not present

## 2017-12-29 DIAGNOSIS — E039 Hypothyroidism, unspecified: Secondary | ICD-10-CM

## 2017-12-29 DIAGNOSIS — J302 Other seasonal allergic rhinitis: Secondary | ICD-10-CM

## 2017-12-29 MED ORDER — FLUTICASONE PROPIONATE 50 MCG/ACT NA SUSP: 1.0000 | Freq: Every day | NASAL | 2 refills | Status: DC

## 2017-12-29 MED ORDER — LEVOTHYROXINE SODIUM 88 MCG PO TABS: 88.0000 ug | ORAL_TABLET | Freq: Every day | ORAL | 1 refills | Status: DC

## 2017-12-29 MED ORDER — RANITIDINE HCL 300 MG PO TABS: 300.0000 mg | ORAL_TABLET | Freq: Every day | ORAL | 1 refills | Status: DC

## 2017-12-29 NOTE — Telephone Encounter (Signed)
Patient was seen today for appointment all concerns were addressed.

## 2017-12-29 NOTE — Progress Notes (Signed)
Name: Maureen [name]   MRN: [id number]    DOB: 1946-02-18   Date:12/29/2017       Progress Note  Subjective  Chief Complaint  Chief Complaint  Patient presents with  . Hypothyroidism  . Hyperglycemia  . Hyperlipidemia    HPI   GERD: she states taking Ranitidine once daily . She denies heartburn or indigestion. She is trying to follow a gerd appropriate diet  Hypothyroidism: weight is stable , back to baseline, taking same dose of levothyroxine ( she [facility])  She denies constipation, hair loss  or dry skin.   Pre-diabetes: she has changed her diet, last hgbA1C had improved, she denies polyphagia, polydipsia or polyuria.   Hyperlipidemia: on life style modification only, cannot tolerate statin therapy , unable to take aspirin because it caused tinnitus in the past  Seasonal allergic rhinitis: she states nasal spray is helping with nasal congestion. Currently no rhinorrhea   Major Depression in remission: she took Remeron at the time to increase appetite but did not take SSRI. Weight is better, she states she is feeling well, involved in her church and the community. Busy all the time. Back to her normal self.   Patient Active Problem List   Diagnosis Date Noted  . Depression, major, recurrent, mild (HCC) 12/29/2017  . Leukopenia 05/24/2017  . Allergic rhinitis, seasonal 06/29/2015  . Clavus 06/29/2015  . 1st degree AV block 06/29/2015  . Hammer toe 06/29/2015  . H/O: HTN (hypertension) 06/29/2015  . Blood glucose elevated 06/29/2015  . Floater, vitreous 06/29/2015  . Bilateral tinnitus 06/29/2015  . Hypothyroidism (acquired) 04/02/2015  . Gastroesophageal reflux disease 04/02/2015  . Hammer toe of right foot 04/02/2015  . Scoliosis of thoracic spine 04/02/2015  . Urethral prolapse 04/02/2015  . Corn of toe 04/02/2015  . Hyperlipidemia 04/02/2015  . Varicose veins of both lower extremities 04/02/2015    Past Surgical History:  Procedure Laterality  Date  . ABDOMINAL HYSTERECTOMY  1989  . APPENDECTOMY  1989  . BREAST EXCISIONAL BIOPSY Bilateral    multiple biopsies negative  . BREAST SURGERY     Several  . TUMOR EXCISION  1989   same time as hysterectomy    Family History  Problem Relation Age of Onset  . Diabetes Father 21       DM complications  . Hypertension Mother 75       CKD  . Thyroid disease Mother   . Heart failure Mother   . Kidney disease Mother   . Breast cancer Sister 24  . Kidney disease Unknown   . Breast cancer Maternal Aunt   . Breast cancer Cousin     Social History   Socioeconomic History  . Marital status: Single    Spouse name: Not on file  . Number of children: Not on file  . Years of education: Not on file  . Highest education level: Not on file  Social Needs  . Financial resource strain: Not on file  . Food insecurity - worry: Not on file  . Food insecurity - inability: Not on file  . Transportation needs - medical: Not on file  . Transportation needs - non-medical: Not on file  Occupational History  . Not on file  Tobacco Use  . Smoking status: Never Smoker  . Smokeless tobacco: Never Used  Substance and Sexual Activity  . Alcohol use: Yes    Alcohol/week: 0.0 oz    Comment: occasional wine  . Drug  use: No  . Sexual activity: Not Currently  Other Topics Concern  . Not on file  Social History Narrative  . Not on file     Current Outpatient Medications:  .  b complex vitamins capsule, Take 1 capsule by mouth daily. (Patient taking differently: Take 1 capsule by mouth daily as needed. For vitamin B deficiency.), Disp: 30 capsule, Rfl: 0 .  fluticasone (FLONASE) 50 MCG/ACT nasal spray, Place 1 spray into both nostrils daily., Disp: 16 g, Rfl: 2 .  Garlic Oil 1000 MG CAPS, Take 1,000 mg by mouth daily. , Disp: , Rfl:  .  levothyroxine (SYNTHROID, LEVOTHROID) 88 MCG tablet, Take 1 tablet (88 mcg total) by mouth daily. Take 1 tablet daily except on Tues and Thursday 1 and half,  Disp: 90 tablet, Rfl: 1 .  ranitidine (ZANTAC) 300 MG tablet, Take 1 tablet (300 mg total) by mouth at bedtime., Disp: 90 tablet, Rfl: 1 .  triamcinolone cream (KENALOG) 0.1 %, Apply 1 application topically 2 (two) times daily., Disp: 45 g, Rfl: 0  Allergies  Allergen Reactions  . Aspirin     Tinnitus  . Citalopram Other (See Comments)  . Codeine Nausea And Vomiting  . Penicillins Rash    Has patient had a PCN reaction causing immediate rash, facial/tongue/throat swelling, SOB or lightheadedness with hypotension: Yes Has patient had a PCN reaction causing severe rash involving mucus membranes or skin necrosis: No Has patient had a PCN reaction that required hospitalization No Has patient had a PCN reaction occurring within the last 10 years: No If all of the above answers are "NO", then may proceed with Cephalosporin use.     ROS  Constitutional: Negative for fever or weight change.  Respiratory: Negative for cough and shortness of breath.   Cardiovascular: Negative for chest pain or palpitations.  Gastrointestinal: Negative for abdominal pain, no bowel changes.  Musculoskeletal: Negative for gait problem or joint swelling.  Skin: Negative for rash.  Neurological: Negative for dizziness or headache.  No other specific complaints in a complete review of systems (except as listed in HPI above).  Objective  Vitals:   12/29/17 1015  BP: 110/80  Pulse: 88  Resp: 14  SpO2: 98%  Weight: 137 lb 14.4 oz (62.6 kg)  Height: 5\' 1"  (1.549 m)    Body mass index is 26.06 kg/m.  Physical Exam  Constitutional: Patient appears well-developed and well-nourished.  No distress.  HEENT: head atraumatic, normocephalic, pupils equal and reactive to light, neck supple, throat within normal limits Cardiovascular: Normal rate, regular rhythm and normal heart sounds.  No murmur heard. No BLE edema. Pulmonary/Chest: Effort normal and breath sounds normal. No respiratory distress. Abdominal:  Soft.  There is no tenderness. Psychiatric: Patient has a normal mood and affect. behavior is normal. Judgment and thought content normal.  PHQ2/9: Depression screen [facility] 2/9 09/22/2017 04/08/2017 10/06/2016 08/12/2016 06/02/2016  Decreased Interest 0 0 0 0 0  Down, Depressed, Hopeless 0 0 0 0 1  PHQ - 2 Score 0 0 0 0 1  Altered sleeping - - - - 1  Tired, decreased energy - - - - 0  Change in appetite - - - - 0  Feeling bad or failure about yourself  - - - - 1  Trouble concentrating - - - - 0  Moving slowly or fidgety/restless - - - - 0  Suicidal thoughts - - - - 0  PHQ-9 Score - - - - 3  Difficult doing work/chores - - - -  Not difficult at all     Fall Risk: Fall Risk  12/29/2017 09/22/2017 04/08/2017 10/06/2016 08/12/2016  Falls in the past year? No No No No No     Functional Status Survey: Is the patient deaf or have difficulty hearing?: No Does the patient have difficulty seeing, even when wearing glasses/contacts?: No Does the patient have difficulty concentrating, remembering, or making decisions?: No Does the patient have difficulty walking or climbing stairs?: No Does the patient have difficulty dressing or bathing?: No Does the patient have difficulty doing errands alone such as visiting a doctor's office or shopping?: No    Assessment & Plan  1. Hypothyroidism (acquired)  - TSH - levothyroxine (SYNTHROID, LEVOTHROID) 88 MCG tablet; Take 1 tablet (88 mcg total) by mouth daily. Take 1 tablet daily except on Tues and Thursday 1 and half  Dispense: 90 tablet; Refill: 1  2. Need for vaccination with 13-polyvalent pneumococcal conjugate vaccine  Refused   3. Gastroesophageal reflux disease without esophagitis  - ranitidine (ZANTAC) 300 MG tablet; Take 1 tablet (300 mg total) by mouth at bedtime.  Dispense: 90 tablet; Refill: 1  4. Blood glucose elevated  hgbA1C   5. Mixed hyperlipidemia  - Lipid panel  6. Pre-diabetes  - Hemoglobin A1c  7. Long-term use of  high-risk medication  - COMPLETE METABOLIC PANEL WITH GFR  8. Allergic rhinitis, seasonal  - fluticasone (FLONASE) 50 MCG/ACT nasal spray; Place 1 spray into both nostrils daily.  Dispense: 16 g; Refill: 2  9. Major depression in remission [facility])  Doing well, not on medication

## 2018-01-08 DIAGNOSIS — E039 Hypothyroidism, unspecified: Secondary | ICD-10-CM | POA: Diagnosis not present

## 2018-01-08 DIAGNOSIS — Z79899 Other long term (current) drug therapy: Secondary | ICD-10-CM | POA: Diagnosis not present

## 2018-01-08 DIAGNOSIS — R7303 Prediabetes: Secondary | ICD-10-CM | POA: Diagnosis not present

## 2018-01-08 DIAGNOSIS — E782 Mixed hyperlipidemia: Secondary | ICD-10-CM | POA: Diagnosis not present

## 2018-01-09 LAB — LIPID PANEL
Cholesterol: 195 mg/dL (ref <200)
HDL: 45 mg/dL — ABNORMAL LOW (ref >50)
LDL Cholesterol (Calc): 136 mg/dL (calc) — ABNORMAL HIGH
Non-HDL Cholesterol (Calc): 150 mg/dL (calc) — ABNORMAL HIGH (ref <130)
Total CHOL/HDL Ratio: 4.3 (calc) (ref <5.0)
Triglycerides: 50 mg/dL (ref <150)

## 2018-01-09 LAB — COMPLETE METABOLIC PANEL WITH GFR
AG Ratio: 1.5 (calc) (ref 1.0–2.5)
ALBUMIN MSPROF: 4.3 g/dL (ref 3.6–5.1)
ALKALINE PHOSPHATASE (APISO): 71 U/L (ref 33–130)
ALT: 15 U/L (ref 6–29)
AST: 25 U/L (ref 10–35)
BUN: 13 mg/dL (ref 7–25)
CO2: 30 mmol/L (ref 20–32)
CREATININE: 0.77 mg/dL (ref 0.60–0.93)
Calcium: 9.5 mg/dL (ref 8.6–10.4)
Chloride: 101 mmol/L (ref 98–110)
GFR, EST AFRICAN AMERICAN: 89 mL/min/{1.73_m2} (ref >=60)
GFR, Est Non African American: 77 mL/min/{1.73_m2} (ref >=60)
GLUCOSE: 83 mg/dL (ref 65–99)
Globulin: 2.8 g/dL (calc) (ref 1.9–3.7)
Potassium: 4.1 mmol/L (ref 3.5–5.3)
Sodium: 139 mmol/L (ref 135–146)
TOTAL PROTEIN: 7.1 g/dL (ref 6.1–8.1)
Total Bilirubin: 0.6 mg/dL (ref 0.2–1.2)

## 2018-01-09 LAB — HEMOGLOBIN A1C
EAG (MMOL/L): 6.8 (calc)
HEMOGLOBIN A1C: 5.9 %{Hb} — AB (ref <5.7)
MEAN PLASMA GLUCOSE: 123 (calc)

## 2018-01-09 LAB — TSH: TSH: 3.2 m[IU]/L (ref 0.40–4.50)

## 2018-01-19 ENCOUNTER — Telehealth: Payer: Self-pay | Admitting: Family Medicine

## 2018-01-19 DIAGNOSIS — E039 Hypothyroidism, unspecified: Secondary | ICD-10-CM

## 2018-01-19 NOTE — Telephone Encounter (Signed)
Patient is calling because she is confused about how to take her Levothyroxine. She is taking 88 mcg [facility]. I told her that she should be taking 88 mcg once daily since she has the 88 mcg again and not the 75 mcg. She states that she was going by the instruction on the prescription. These instructions need to be corrected and put on no print. Instructions are as follows: Take 1 tablet (88 mcg total) by mouth daily. Take 1 tablet daily except on Tues and Thursday 1 and half

## 2018-01-19 NOTE — Telephone Encounter (Signed)
She needs to take one daily 88 mcg and only half twice weekly , not one and half twice weekly, needs to repeat in 6 weeks, that would be equivalent to 75 mcg daily

## 2018-01-19 NOTE — Telephone Encounter (Signed)
Copied from CRM [phone]. Topic: Quick Communication - See Telephone Encounter >> Jan 19, 2018  8:29 AM Jolayne Hainesaylor, Brittany L wrote: CRM for notification. See Telephone encounter for: 01/19/18.  Patient has questions on how to take the levothyroxine (SYNTHROID, LEVOTHROID) 88 MCG tablet because she said last time she gave her 75mcg.

## 2018-01-22 NOTE — Telephone Encounter (Signed)
Copied from CRM [phone]. Topic: Inquiry >> Jan 19, 2018  5:09 PM Alba CorySowles, Krichna, MD wrote: Reason for CRM:  88 mcg every day and half a pill twice a week. See telephone encounter.  >> Jan 19, 2018  5:25 PM Raquel SarnaHayes, Teresa G wrote: Pt returned Dr. Shelbie AmmonsSowels call.  Call her back asap.

## 2018-01-25 NOTE — Telephone Encounter (Signed)
She can continue taking it like that since last TSH was at goal.

## 2018-01-27 NOTE — Telephone Encounter (Signed)
Left a message , stay on dose she has been on and we recheck in 6 weeks

## 2018-01-27 NOTE — Telephone Encounter (Signed)
Called patient she does not want to take the prescription like you recommended. She states that she has always been on 88 mcg once daily and the only reason it was switched was [facility] and [facility] 75 mg dose to equal the 88. She would like for you to call her today.

## 2018-01-28 NOTE — Telephone Encounter (Signed)
signed

## 2018-01-29 ENCOUNTER — Other Ambulatory Visit: Payer: Self-pay

## 2018-01-29 DIAGNOSIS — E039 Hypothyroidism, unspecified: Secondary | ICD-10-CM

## 2018-01-29 NOTE — Telephone Encounter (Signed)
Mailed lab slip with instructions to recheck TSH Level around Mar 12, 2018

## 2018-02-02 ENCOUNTER — Other Ambulatory Visit: Payer: Self-pay | Admitting: Family Medicine

## 2018-02-02 DIAGNOSIS — J302 Other seasonal allergic rhinitis: Secondary | ICD-10-CM

## 2018-02-08 ENCOUNTER — Ambulatory Visit: Payer: Medicare HMO | Admitting: Nurse Practitioner

## 2018-02-22 ENCOUNTER — Ambulatory Visit: Payer: Self-pay | Admitting: *Deleted

## 2018-02-22 NOTE — Telephone Encounter (Signed)
   Answer Assessment - Initial Assessment Questions 1. LOCATION: "Which joint is swollen?"     Left ankle 2. ONSET: "When did the swelling start?"    One to two weeks ago. The edema is intermittent and as soon as she notices, she elevates it and swelling goes away.  3. SIZE: "How large is the swelling?"   mild 4. PAIN: "Is there any pain?" If so, ask: "How bad is it?" (Scale 1-10; or mild, moderate, severe)   none 5. CAUSE: "What do you think caused the swollen joint?"    Walks a lot. Does not wear support stockings. She has varicose veins.  6. OTHER SYMPTOMS: "Do you have any other symptoms?" (e.g., fever, chest pain, difficulty breathing, calf pain)    no 7. PREGNANCY: "Is there any chance you are pregnant?" "When was your last menstrual period?"   no  Protocols used: ANKLE SWELLING-A-AH

## 2018-02-22 NOTE — Telephone Encounter (Signed)
Mrs. Lemieux has some mild ankle (and top of foot) edema-left foot only She stated she noticed it one-two weeks ago. It is intermittent, she notices it when she walks a lot and is on her feet most of the day. It resolves when she elevates the foot. She denies any calf pain and states it is not warm to touch. She denies all other symptoms. She has attempted to watch and reduce her sodium intake. Her next appointment is 03/04/18. Would you advise light compression stockings?

## 2018-02-22 NOTE — Telephone Encounter (Signed)
Patient called, left VM to return the call to the office to discuss her left foot swelling.

## 2018-02-23 NOTE — Telephone Encounter (Signed)
Verify that she does not have SOB with activity, chest pain or sob when laying flat, otherwise can try compression stocking hoses otc for now

## 2018-02-24 NOTE — Telephone Encounter (Signed)
Patient states she is not having any SOB with activity or when laying down nor chest pain. Patient did [facility] some [facility] and is wearing them. Patient will be in on Mar 04, 2018 to see Dr. Carlynn Purl

## 2018-03-04 ENCOUNTER — Ambulatory Visit (INDEPENDENT_AMBULATORY_CARE_PROVIDER_SITE_OTHER): Payer: Medicare HMO | Admitting: Family Medicine

## 2018-03-04 ENCOUNTER — Encounter: Payer: Self-pay | Admitting: Family Medicine

## 2018-03-04 VITALS — BP 140/90 | HR 98 | Resp 16 | Ht 61.0 in | Wt 133.8 lb

## 2018-03-04 DIAGNOSIS — I8393 Asymptomatic varicose veins of bilateral lower extremities: Secondary | ICD-10-CM

## 2018-03-04 DIAGNOSIS — N952 Postmenopausal atrophic vaginitis: Secondary | ICD-10-CM

## 2018-03-04 DIAGNOSIS — I890 Lymphedema, not elsewhere classified: Secondary | ICD-10-CM

## 2018-03-04 MED ORDER — ESTROGENS, CONJUGATED 0.625 MG/GM VA CREA: 1.0000 | TOPICAL_CREAM | Freq: Every day | VAGINAL | 12 refills | Status: DC

## 2018-03-04 NOTE — Patient Instructions (Signed)
[place] jelly at local pharmacy   Lymphedema Lymphedema is swelling that is caused by the abnormal collection of lymph under the skin. Lymph is fluid from the tissues in your body that travels in the lymphatic system. This system is part of the immune system and includes lymph nodes and lymph vessels. The lymph vessels collect and carry the excess fluid, fats, proteins, and wastes from the tissues of the body to the bloodstream. This system also works to clean and remove bacteria and waste products from the body. Lymphedema occurs when the lymphatic system is blocked. When the lymph vessels or lymph nodes are blocked or damaged, lymph does not drain properly, causing an abnormal buildup of lymph. This leads to swelling in the arms or legs. Lymphedema cannot be cured by medicines, but various methods can be used to help reduce the swelling. What are the causes? There are two types of lymphedema. Primary lymphedema is caused by the absence or abnormality of the lymph vessel at birth. Secondary lymphedema is more common. It occurs when the lymph vessel is damaged or blocked. Common causes of lymph vessel blockage include:  Skin infection, such as cellulitis.  Infection by parasites (filariasis).  Injury.  Cancer.  Radiation therapy.  Formation of scar tissue.  Surgery.  What are the signs or symptoms? Symptoms of this condition include:  Swelling of the arm or leg.  A heavy or tight feeling in the arm or leg.  Swelling of the feet, toes, or fingers. Shoes or rings may fit more tightly than before.  Redness of the skin over the affected area.  Limited movement of the affected limb.  Sensitivity to touch or discomfort in the affected limb.  How is this diagnosed? This condition may be diagnosed with:  A physical exam.  Medical history.  Bioimpedance spectroscopy. In this test, painless electrical currents are used to measure fluid levels in your body.  Imaging tests, such  as: ? Lymphoscintigraphy. In this test, a low dose of a radioactive substance is injected to trace the flow of lymph through the lymph vessels. ? MRI. ? CT scan. ? Duplex ultrasound. This test uses sound waves to produce images of the vessels and the blood flow on a screen. ? Lymphangiography. In this test, a contrast dye is injected into the lymph vessel to help show blockages.  How is this treated? Treatment for this condition may depend on the cause. Treatment may include:  Exercise. Certain exercises can help fluid move out of the affected limb.  Massage. Gentle massage of the affected limb can help move the fluid out of the area.  Compression. Various methods may be used to apply pressure to the affected limb in order to reduce the swelling. ? Wearing compression stockings or sleeves on the affected limb. ? Bandaging the affected limb. ? Using an external pump that is attached to a sleeve that alternates between applying pressure and releasing pressure.  Surgery. This is usually only done for severe cases. For example, surgery may be done if you have trouble moving the limb or if the swelling does not get better with other treatments.  If an underlying condition is causing the lymphedema, treatment for that condition is needed. For example, antibiotic medicines may be used to treat an infection. Follow these instructions at home: Activities  Exercise regularly as directed by your health care provider.  Do not sit with your legs crossed.  When possible, keep the affected limb raised (elevated) above the level of your  heart.  Avoid carrying things with an arm that is affected by lymphedema.  Remember that the affected area is more likely to become injured or infected.  Take these steps to help prevent infection: ? Keep the affected area clean and dry. ? Protect your skin from cuts. For example, you should use gloves while cooking or gardening. Do not walk barefoot. If you  shave the affected area, use an Neurosurgeon. General instructions  Take medicines only as directed by your health care provider.  Eat a healthy diet that includes a lot of fruits and vegetables.  Do not wear tight clothes, shoes, or jewelry.  Do not use heating pads over the affected area.  Avoid having blood pressure checked on the affected limb.  Keep all follow-up visits as directed by your health care provider. This is important. Contact a health care provider if:  You continue to have swelling in your limb.  You have a fever.  You have a cut that does not heal.  You have redness or pain in the affected area.  You have new swelling in your limb that comes on suddenly.  You develop purplish spots or sores (lesions) on your limb. Get help right away if:  You have a skin rash.  You have chills or sweats.  You have shortness of breath. This information is not intended to replace advice given to you by your health care provider. Make sure you discuss any questions you have with your health care provider. Document Released: 08/10/2007 Document Revised: 06/19/2016 Document Reviewed: 09/20/2014 Elsevier Interactive Patient Education  [employer].

## 2018-03-04 NOTE — Progress Notes (Signed)
Name: Maureen [name]   MRN: [id number]    DOB: August 03, 1946   Date:03/04/2018       Progress Note  Subjective  Chief Complaint  Chief Complaint  Patient presents with  . Foot Swelling    left foot has been swelling x 2 weeks intertmittent. She has felt tightness. She has been elevating her foot and wearing compression sock and reduced her salt intake to help with the swelling. It has improved some.  . Vaginal Atrophy    HPI  Lower extremity edema: he states left slightly more than right side, improves with elevation and compression stocking hoses, she has varicose veins, no pain, but sometimes left leg feels warm.   Vaginal atrophy: she is dating now and states difficulty opening her legs, she states tried to have sex once and saw a pink discharge afterwards, she has premarin at home but is a old rx and needs refills. Discussed STI prevention and need to use condoms.    Patient Active Problem List   Diagnosis Date Noted  . Depression, major, recurrent, mild (HCC) 12/29/2017  . Leukopenia 05/24/2017  . Allergic rhinitis, seasonal 06/29/2015  . Clavus 06/29/2015  . 1st degree AV block 06/29/2015  . Hammer toe 06/29/2015  . H/O: HTN (hypertension) 06/29/2015  . Blood glucose elevated 06/29/2015  . Floater, vitreous 06/29/2015  . Bilateral tinnitus 06/29/2015  . Hypothyroidism (acquired) 04/02/2015  . Gastroesophageal reflux disease 04/02/2015  . Hammer toe of right foot 04/02/2015  . Scoliosis of thoracic spine 04/02/2015  . Urethral prolapse 04/02/2015  . Corn of toe 04/02/2015  . Hyperlipidemia 04/02/2015  . Varicose veins of both lower extremities 04/02/2015    Past Surgical History:  Procedure Laterality Date  . ABDOMINAL HYSTERECTOMY  1989  . APPENDECTOMY  1989  . BREAST EXCISIONAL BIOPSY Bilateral    multiple biopsies negative  . BREAST SURGERY     Several  . TUMOR EXCISION  1989   same time as hysterectomy    Family History  Problem Relation Age of Onset  .  Diabetes Father 14       DM complications  . Hypertension Mother 61       CKD  . Thyroid disease Mother   . Heart failure Mother   . Kidney disease Mother   . Breast cancer Sister 33  . Kidney disease Unknown   . Breast cancer Maternal Aunt   . Breast cancer Cousin     Social History   Socioeconomic History  . Marital status: Single    Spouse name: Not on file  . Number of children: Not on file  . Years of education: Not on file  . Highest education level: Not on file  Occupational History  . Not on file  Social Needs  . Financial resource strain: Not on file  . Food insecurity:    Worry: Not on file    Inability: Not on file  . Transportation needs:    Medical: Not on file    Non-medical: Not on file  Tobacco Use  . Smoking status: Never Smoker  . Smokeless tobacco: Never Used  Substance and Sexual Activity  . Alcohol use: Yes    Alcohol/week: 0.0 oz    Comment: occasional wine  . Drug use: No  . Sexual activity: Not Currently  Lifestyle  . Physical activity:    Days per week: Not on file    Minutes per session: Not on file  . Stress: Not on file  Relationships  .  Social connections:    Talks on phone: Not on file    Gets together: Not on file    Attends religious service: Not on file    Active member of club or organization: Not on file    Attends meetings of clubs or organizations: Not on file    Relationship status: Not on file  . Intimate partner violence:    Fear of current or ex partner: Not on file    Emotionally abused: Not on file    Physically abused: Not on file    Forced sexual activity: Not on file  Other Topics Concern  . Not on file  Social History Narrative  . Not on file     Current Outpatient Medications:  .  b complex vitamins capsule, Take 1 capsule by mouth daily. (Patient taking differently: Take 1 capsule by mouth daily as needed. For vitamin B deficiency.), Disp: 30 capsule, Rfl: 0 .  fluticasone (FLONASE) 50 MCG/ACT nasal  spray, Place 1 spray into both nostrils daily., Disp: 16 g, Rfl: 2 .  Garlic Oil 1000 MG CAPS, Take 1,000 mg by mouth daily. , Disp: , Rfl:  .  levothyroxine (SYNTHROID, LEVOTHROID) 88 MCG tablet, Take 1 tablet (88 mcg total) by mouth daily. Take 1 tablet daily except on Tues and Thursday 1 and half (Patient taking differently: Take 88 mcg by mouth daily. Take 1 tablet daily except on Tues and Thursday take half), Disp: 90 tablet, Rfl: 1 .  ranitidine (ZANTAC) 300 MG tablet, Take 1 tablet (300 mg total) by mouth at bedtime., Disp: 90 tablet, Rfl: 1 .  triamcinolone cream (KENALOG) 0.1 %, Apply 1 application topically 2 (two) times daily., Disp: 45 g, Rfl: 0  Allergies  Allergen Reactions  . Aspirin     Tinnitus  . Citalopram Other (See Comments)  . Codeine Nausea And Vomiting  . Penicillins Rash    Has patient had a PCN reaction causing immediate rash, facial/tongue/throat swelling, SOB or lightheadedness with hypotension: Yes Has patient had a PCN reaction causing severe rash involving mucus membranes or skin necrosis: No Has patient had a PCN reaction that required hospitalization No Has patient had a PCN reaction occurring within the last 10 years: No If all of the above answers are "NO", then may proceed with Cephalosporin use.     ROS  Ten systems reviewed and is negative except as mentioned in HPI   Objective  Vitals:   03/04/18 0739  BP: 140/90  Pulse: 98  Resp: 16  SpO2: 98%  Weight: 133 lb 12.8 oz (60.7 kg)  Height:  (1.549 m)    Body mass index is 25.28 kg/m.  Physical Exam  Constitutional: Patient appears well-developed and well-nourished.  No distress.  HEENT: head atraumatic, normocephalic, pupils equal and reactive to light, neck supple, throat within normal limits Cardiovascular: Normal rate, regular rhythm and normal heart sounds.  No murmur heard. BLE edema, lymphedema , varicose veins mostly spider with a cord behind left lower leg. Left leg slightly  more swollen than right . Pulmonary/Chest: Effort normal and breath sounds normal. No respiratory distress. Abdominal: Soft.  There is no tenderness. Psychiatric: Patient has a normal mood and affect. behavior is normal. Judgment and thought content normal.  Recent Results (from the past 2160 hour(s))  TSH     Status: None   Collection Time: 01/08/18  9:36 AM  Result Value Ref Range   TSH 3.20 0.40 - 4.50 mIU/L  Hemoglobin A1c  Status: Abnormal   Collection Time: 01/08/18  9:36 AM  Result Value Ref Range   Hgb A1c MFr Bld 5.9 (H) <5.7 % of total Hgb    Comment: For someone without known diabetes, a hemoglobin  A1c value between 5.7% and 6.4% is consistent with prediabetes and should be confirmed with a  follow-up test. . For someone with known diabetes, a value <7% indicates that their diabetes is well controlled. A1c targets should be individualized based on duration of diabetes, age, comorbid conditions, and other considerations. . This assay result is consistent with an increased risk of diabetes. . Currently, no consensus exists regarding use of hemoglobin A1c for diagnosis of diabetes for children. .    Mean Plasma Glucose 123 (calc)   eAG (mmol/L) 6.8 (calc)  Lipid panel     Status: Abnormal   Collection Time: 01/08/18  9:36 AM  Result Value Ref Range   Cholesterol 195 <200 mg/dL   HDL 45 (L) >16 mg/dL   Triglycerides 50 <109 mg/dL   LDL Cholesterol (Calc) 136 (H) mg/dL (calc)    Comment: Reference range: <100 . Desirable range <100 mg/dL for primary prevention;   <70 mg/dL for patients with CHD or diabetic patients  with > or = 2 CHD risk factors. Marland Kitchen LDL-C is now calculated using the Martin-Hopkins  calculation, which is a validated novel method providing  better accuracy than the Friedewald equation in the  estimation of LDL-C.  Horald Pollen et al. Lenox Ahr. 6045;409(81): 2061-2068  (http://education.[facility].com/faq/FAQ164)    Total CHOL/HDL Ratio 4.3  <5.0 (calc)   Non-HDL Cholesterol (Calc) 150 (H) <130 mg/dL (calc)    Comment: For patients with diabetes plus 1 major ASCVD risk  factor, treating to a non-HDL-C goal of <100 mg/dL  (LDL-C of <19 mg/dL) is considered a therapeutic  option.   COMPLETE METABOLIC PANEL WITH GFR     Status: None   Collection Time: 01/08/18  9:36 AM  Result Value Ref Range   Glucose, Bld 83 65 - 99 mg/dL    Comment: .            Fasting reference interval .    BUN 13 7 - 25 mg/dL   Creat 1.47 8.29 - 5.62 mg/dL    Comment: For patients >65 years of age, the reference limit for Creatinine is approximately 13% higher for people identified as African-American. .    GFR, Est Non African American 77 > OR = 60 mL/min/1.30m2   GFR, Est African American 89 > OR = 60 mL/min/1.9m2   BUN/Creatinine Ratio NOT APPLICABLE 6 - 22 (calc)   Sodium 139 135 - 146 mmol/L   Potassium 4.1 3.5 - 5.3 mmol/L   Chloride 101 98 - 110 mmol/L   CO2 30 20 - 32 mmol/L   Calcium 9.5 8.6 - 10.4 mg/dL   Total Protein 7.1 6.1 - 8.1 g/dL   Albumin 4.3 3.6 - 5.1 g/dL   Globulin 2.8 1.9 - 3.7 g/dL (calc)   AG Ratio 1.5 1.0 - 2.5 (calc)   Total Bilirubin 0.6 0.2 - 1.2 mg/dL   Alkaline phosphatase (APISO) 71 33 - 130 U/L   AST 25 10 - 35 U/L   ALT 15 6 - 29 U/L     PHQ2/9: Depression screen [facility] 2/9 03/04/2018 09/22/2017 04/08/2017 10/06/2016 08/12/2016  Decreased Interest 0 0 0 0 0  Down, Depressed, Hopeless 0 0 0 0 0  PHQ - 2 Score 0 0 0 0 0  Altered sleeping  0 - - - -  Tired, decreased energy 0 - - - -  Change in appetite 0 - - - -  Feeling bad or failure about yourself  0 - - - -  Trouble concentrating 0 - - - -  Moving slowly or fidgety/restless 0 - - - -  Suicidal thoughts 0 - - - -  PHQ-9 Score 0 - - - -  Difficult doing work/chores Not difficult at all - - - -   Fall Risk: Fall Risk  03/04/2018 12/29/2017 09/22/2017 04/08/2017 10/06/2016  Falls in the past year? No No No No No     Functional Status Survey: Is the  patient deaf or have difficulty hearing?: No Does the patient have difficulty seeing, even when wearing glasses/contacts?: No Does the patient have difficulty concentrating, remembering, or making decisions?: No Does the patient have difficulty walking or climbing stairs?: No Does the patient have difficulty dressing or bathing?: No Does the patient have difficulty doing errands alone such as visiting a doctor's office or shopping?: No     Assessment & Plan  1. Lymphedema  - Ambulatory referral to Vascular Surgery  2. Varicose veins of both lower extremities, unspecified whether complicated  - Ambulatory referral to Vascular Surgery  3. Vaginal atrophy  - conjugated estrogens (PREMARIN) vaginal cream; Place 1 Applicatorful vaginally daily.  Dispense: 42.5 g; Refill: 12 Discussed [place] and need for STI screen next visit

## 2018-04-02 ENCOUNTER — Encounter (INDEPENDENT_AMBULATORY_CARE_PROVIDER_SITE_OTHER): Payer: Self-pay | Admitting: Vascular Surgery

## 2018-04-02 ENCOUNTER — Ambulatory Visit (INDEPENDENT_AMBULATORY_CARE_PROVIDER_SITE_OTHER): Payer: Medicare HMO | Admitting: Vascular Surgery

## 2018-04-02 VITALS — BP 150/90 | HR 90 | Resp 16 | Ht 63.0 in | Wt 135.0 lb

## 2018-04-02 DIAGNOSIS — M7989 Other specified soft tissue disorders: Secondary | ICD-10-CM

## 2018-04-02 DIAGNOSIS — I10 Essential (primary) hypertension: Secondary | ICD-10-CM | POA: Diagnosis not present

## 2018-04-02 DIAGNOSIS — I83813 Varicose veins of bilateral lower extremities with pain: Secondary | ICD-10-CM

## 2018-04-02 NOTE — Assessment & Plan Note (Signed)
Likely from venous disease but may have a component of lymphedema from chronic scarring and lymph channels as well.  Work-up and treatment as below.

## 2018-04-02 NOTE — Patient Instructions (Signed)
Varicose Veins Varicose veins are veins that have become enlarged and twisted. They are usually seen in the legs but can occur in other parts of the body as well. What are the causes? This condition is the result of valves in the veins not working properly. Valves in the veins help to return blood from the leg to the heart. If these valves are damaged, blood flows backward and backs up into the veins in the leg near the skin. This causes the veins to become larger. What increases the risk? People who are on their feet a lot, who are pregnant, or who are overweight are more likely to develop varicose veins. What are the signs or symptoms?  Bulging, twisted-appearing, bluish veins, most commonly found on the legs.  Leg pain or a feeling of heaviness. These symptoms may be worse at the end of the day.  Leg swelling.  Changes in skin color. How is this diagnosed? A health care provider can usually diagnose varicose veins by examining your legs. Your health care provider may also recommend an ultrasound of your leg veins. How is this treated? Most varicose veins can be treated at home.However, other treatments are available for people who have persistent symptoms or want to improve the cosmetic appearance of the varicose veins. These treatment options include:  Sclerotherapy. A solution is injected into the vein to close it off.  Laser treatment. A laser is used to heat the vein to close it off.  Radiofrequency vein ablation. An electrical current produced by radio waves is used to close off the vein.  Phlebectomy. The vein is surgically removed through small incisions made over the varicose vein.  Vein ligation and stripping. The vein is surgically removed through incisions made over the varicose vein after the vein has been tied (ligated). Follow these instructions at home:   Do not stand or sit in one position for long periods of time. Do not sit with your legs crossed. Rest with your  legs raised during the day.  Wear compression stockings as directed by your health care provider. These stockings help to prevent blood clots and reduce swelling in your legs.  Do not wear other tight, encircling garments around your legs, pelvis, or waist.  Walk as much as possible to increase blood flow.  Raise the foot of your bed at night with 2-inch blocks.  If you get a cut in the skin over the vein and the vein bleeds, lie down with your leg raised and press on it with a clean cloth until the bleeding stops. Then place a bandage (dressing) on the cut. See your health care provider if it continues to bleed. Contact a health care provider if:  The skin around your ankle starts to break down.  You have pain, redness, tenderness, or hard swelling in your leg over a vein.  You are uncomfortable because of leg pain. This information is not intended to replace advice given to you by your health care provider. Make sure you discuss any questions you have with your health care provider. Document Released: 07/23/2005 Document Revised: 03/20/2016 Document Reviewed: 04/15/2016 Elsevier Interactive Patient Education  2017 [employer]  

## 2018-04-02 NOTE — Assessment & Plan Note (Signed)
blood pressure control important in reducing the progression of atherosclerotic disease. On appropriate oral medications.  

## 2018-04-02 NOTE — Progress Notes (Signed)
Patient ID: Maureen [name], female   DOB: 10/08/1946, 72 y.o.   MRN: [id number]  Chief Complaint  Patient presents with  . New Patient (Initial Visit)    ref Carlynn Purl for Varicose veins and lymphedema    HPI Maureen [name] is a 72 y.o. female.  I am asked to see the patient by Dr. Carlynn Purl for evaluation of leg swelling and varicose veins.  The patient presents with complaints of symptomatic varicosities of the lower extremities. The patient reports a long standing history of varicosities and they have become painful over time.  He says she inherited these veins from her mom and is had them since she was in school.  There was no clear inciting event or causative factor that started the symptoms.  Legs are affected about the same.  The patient elevates the legs for relief. The pain is described as heaviness and tiredness in the legs. The symptoms are generally most severe in the evening, particularly when they have been on their feet for long periods of time.  She recently got some support socks and these have been used to try to improve the symptoms with mild success. The patient complains of worsening swelling as an associated symptom. This has been progressing for years now and is now the primary problem. The patient has no previous history of deep venous thrombosis or superficial thrombophlebitis to their knowledge.     Past Medical History:  Diagnosis Date  . Allergy   . Corn of toe   . First degree AV block   . GERD (gastroesophageal reflux disease)   . Hammer toe of right foot   . Hyperglycemia   . Hyperlipidemia   . Hypertension   . Loss of appetite   . Prolapse of urethra   . Scoliosis (and kyphoscoliosis), idiopathic   . Thyroid disease   . Tinnitus of both ears   . Vitreous floaters of right eye     Past Surgical History:  Procedure Laterality Date  . ABDOMINAL HYSTERECTOMY  1989  . APPENDECTOMY  1989  . BREAST EXCISIONAL BIOPSY Bilateral    multiple biopsies negative   . BREAST SURGERY     Several  . TUMOR EXCISION  1989   same time as hysterectomy    Family History  Problem Relation Age of Onset  . Diabetes Father 54       DM complications  . Hypertension Mother 59       CKD  . Thyroid disease Mother   . Heart failure Mother   . Kidney disease Mother   . Breast cancer Sister 28  . Kidney disease Unknown   . Breast cancer Maternal Aunt   . Breast cancer Cousin     Social History Social History   Tobacco Use  . Smoking status: Never Smoker  . Smokeless tobacco: Never Used  Substance Use Topics  . Alcohol use: Yes    Alcohol/week: 0.0 oz    Comment: occasional wine  . Drug use: No    Allergies  Allergen Reactions  . Aspirin     Tinnitus  . Citalopram Other (See Comments)  . Codeine Nausea And Vomiting  . Penicillins Rash    Has patient had a PCN reaction causing immediate rash, facial/tongue/throat swelling, SOB or lightheadedness with hypotension: Yes Has patient had a PCN reaction causing severe rash involving mucus membranes or skin necrosis: No Has patient had a PCN reaction that required hospitalization No Has patient had a PCN  reaction occurring within the last 10 years: No If all of the above answers are "NO", then may proceed with Cephalosporin use.    Current Outpatient Medications  Medication Sig Dispense Refill  . b complex vitamins capsule Take 1 capsule by mouth daily. (Patient taking differently: Take 1 capsule by mouth daily as needed. For vitamin B deficiency.) 30 capsule 0  . conjugated estrogens (PREMARIN) vaginal cream Place 1 Applicatorful vaginally daily. 42.5 g 12  . fluticasone (FLONASE) 50 MCG/ACT nasal spray Place 1 spray into both nostrils daily. 16 g 2  . Garlic Oil 1000 MG CAPS Take 1,000 mg by mouth daily.     Marland Kitchen levothyroxine (SYNTHROID, LEVOTHROID) 88 MCG tablet Take 1 tablet (88 mcg total) by mouth daily. Take 1 tablet daily except on Tues and Thursday 1 and half (Patient taking differently: Take  88 mcg by mouth daily. Take 1 tablet daily except on Tues and Thursday take half) 90 tablet 1  . ranitidine (ZANTAC) 300 MG tablet Take 1 tablet (300 mg total) by mouth at bedtime. 90 tablet 1  . triamcinolone cream (KENALOG) 0.1 % Apply 1 application topically 2 (two) times daily. 45 g 0   No current facility-administered medications for this visit.       REVIEW OF SYSTEMS (Negative unless checked)  Constitutional: [] Weight loss  [] Fever  [] Chills Cardiac: [] Chest pain   [] Chest pressure   [] Palpitations   [] Shortness of breath when laying flat   [] Shortness of breath at rest   [] Shortness of breath with exertion. Vascular:  [] Pain in legs with walking   [] Pain in legs at rest   [] Pain in legs when laying flat   [] Claudication   [] Pain in feet when walking  [] Pain in feet at rest  [] Pain in feet when laying flat   [] History of DVT   [] Phlebitis   [x] Swelling in legs   [x] Varicose veins   [] Non-healing ulcers Pulmonary:   [] Uses home oxygen   [] Productive cough   [] Hemoptysis   [] Wheeze  [] COPD   [] Asthma Neurologic:  [] Dizziness  [] Blackouts   [] Seizures   [] History of stroke   [] History of TIA  [] Aphasia   [] Temporary blindness   [] Dysphagia   [] Weakness or numbness in arms   [] Weakness or numbness in legs Musculoskeletal:  [x] Arthritis   [] Joint swelling   [] Joint pain   [] Low back pain Hematologic:  [] Easy bruising  [] Easy bleeding   [] Hypercoagulable state   [] Anemic  [] Hepatitis Gastrointestinal:  [] Blood in stool   [] Vomiting blood  [] Gastroesophageal reflux/heartburn   [] Abdominal pain Genitourinary:  [] Chronic kidney disease   [] Difficult urination  [] Frequent urination  [] Burning with urination   [] Hematuria Skin:  [] Rashes   [] Ulcers   [] Wounds Psychological:  [] History of anxiety   []  History of major depression.    Physical Exam BP (!) 150/90 (BP Location: Right Arm)   Pulse 90   Resp 16   Ht 5\' 3"  (1.6 m)   Wt 135 lb (61.2 kg)   BMI 23.91 kg/m  Gen:  WD/WN, NAD. Appears  younger than stated age Head: [place]/AT, No temporalis wasting.  Ear/Nose/Throat: Hearing grossly intact, dentition good Eyes: Sclera non-icteric. Conjunctiva clear Neck: Supple. Trachea midline Pulmonary:  Good air movement, no use of accessory muscles, respirations not labored.  Cardiac: RRR, No JVD Vascular: Varicosities extensive and measuring up to 1-2 mm in the right lower extremity        Varicosities extensive and measuring up to 2 mm in the  left lower extremity Vessel Right Left  Radial Palpable Palpable                          PT 1+ Palpable 1+ Palpable  DP Palpable Palpable   Gastrointestinal: soft, non-tender/non-distended.  Musculoskeletal: M/S 5/5 throughout.   1-2 + RLE edema.  1-2 + LLE edema Neurologic: Sensation grossly intact in extremities.  Symmetrical.  Speech is fluent.  Psychiatric: Judgment intact, Mood & affect appropriate for pt's clinical situation. Dermatologic: No rashes or ulcers noted.  No cellulitis or open wounds.    Radiology No results found.  Labs Recent Results (from the past 2160 hour(s))  TSH     Status: None   Collection Time: 01/08/18  9:36 AM  Result Value Ref Range   TSH 3.20 0.40 - 4.50 mIU/L  Hemoglobin A1c     Status: Abnormal   Collection Time: 01/08/18  9:36 AM  Result Value Ref Range   Hgb A1c MFr Bld 5.9 (H) <5.7 % of total Hgb    Comment: For someone without known diabetes, a hemoglobin  A1c value between 5.7% and 6.4% is consistent with prediabetes and should be confirmed with a  follow-up test. . For someone with known diabetes, a value <7% indicates that their diabetes is well controlled. A1c targets should be individualized based on duration of diabetes, age, comorbid conditions, and other considerations. . This assay result is consistent with an increased risk of diabetes. . Currently, no consensus exists regarding use of hemoglobin A1c for diagnosis of diabetes for children. .    Mean Plasma Glucose 123  (calc)   eAG (mmol/L) 6.8 (calc)  Lipid panel     Status: Abnormal   Collection Time: 01/08/18  9:36 AM  Result Value Ref Range   Cholesterol 195 <200 mg/dL   HDL 45 (L) >40>50 mg/dL   Triglycerides 50 <981<150 mg/dL   LDL Cholesterol (Calc) 136 (H) mg/dL (calc)    Comment: Reference range: <100 . Desirable range <100 mg/dL for primary prevention;   <70 mg/dL for patients with CHD or diabetic patients  with > or = 2 CHD risk factors. Marland Kitchen. LDL-C is now calculated using the Martin-Hopkins  calculation, which is a validated novel method providing  better accuracy than the Friedewald equation in the  estimation of LDL-C.  Horald PollenMartin SS et al. Lenox AhrJAMA. 1914;782(952013;310(19): 2061-2068  (http://education.[facility].com/faq/FAQ164)    Total CHOL/HDL Ratio 4.3 <5.0 (calc)   Non-HDL Cholesterol (Calc) 150 (H) <130 mg/dL (calc)    Comment: For patients with diabetes plus 1 major ASCVD risk  factor, treating to a non-HDL-C goal of <100 mg/dL  (LDL-C of <62<70 mg/dL) is considered a therapeutic  option.   COMPLETE METABOLIC PANEL WITH GFR     Status: None   Collection Time: 01/08/18  9:36 AM  Result Value Ref Range   Glucose, Bld 83 65 - 99 mg/dL    Comment: .            Fasting reference interval .    BUN 13 7 - 25 mg/dL   Creat 1.300.77 8.650.60 - 7.840.93 mg/dL    Comment: For patients >72 years of age, the reference limit for Creatinine is approximately 13% higher for people identified as African-American. .    GFR, Est Non African American 77 > OR = 60 mL/min/1.7973m2   GFR, Est African American 89 > OR = 60 mL/min/1.1473m2   BUN/Creatinine Ratio NOT APPLICABLE 6 - 22 (calc)  Sodium 139 135 - 146 mmol/L   Potassium 4.1 3.5 - 5.3 mmol/L   Chloride 101 98 - 110 mmol/L   CO2 30 20 - 32 mmol/L   Calcium 9.5 8.6 - 10.4 mg/dL   Total Protein 7.1 6.1 - 8.1 g/dL   Albumin 4.3 3.6 - 5.1 g/dL   Globulin 2.8 1.9 - 3.7 g/dL (calc)   AG Ratio 1.5 1.0 - 2.5 (calc)   Total Bilirubin 0.6 0.2 - 1.2 mg/dL   Alkaline  phosphatase (APISO) 71 33 - 130 U/L   AST 25 10 - 35 U/L   ALT 15 6 - 29 U/L    Assessment/Plan:  Hypertension blood pressure control important in reducing the progression of atherosclerotic disease. On appropriate oral medications.   Swelling of limb Likely from venous disease but may have a component of lymphedema from chronic scarring and lymph channels as well.  Work-up and treatment as below.  Varicose veins of both lower extremities   The patient has symptoms consistent with chronic venous insufficiency. We discussed the natural history and treatment options for venous disease. I recommended the regular use of 20 - 30 mm Hg compression stockings, and prescribed these today. I recommended leg elevation and anti-inflammatories as needed for pain. I have also recommended a complete venous duplex to assess the venous system for reflux or thrombotic issues. This can be done at the patient's convenience. I will see the patient back in 3 months to assess the response to conservative management, and determine further treatment options.     Festus Barren 04/02/2018, 12:31 PM   This note was created with Dragon medical transcription system.  Any errors from dictation are unintentional.

## 2018-04-13 ENCOUNTER — Encounter: Payer: Self-pay | Admitting: Family Medicine

## 2018-04-13 ENCOUNTER — Ambulatory Visit (INDEPENDENT_AMBULATORY_CARE_PROVIDER_SITE_OTHER): Payer: Medicare HMO | Admitting: Family Medicine

## 2018-04-13 VITALS — BP 134/84 | HR 87 | Temp 98.1°F | Resp 16 | Ht 63.0 in | Wt 134.4 lb

## 2018-04-13 DIAGNOSIS — I890 Lymphedema, not elsewhere classified: Secondary | ICD-10-CM | POA: Diagnosis not present

## 2018-04-13 DIAGNOSIS — F325 Major depressive disorder, single episode, in full remission: Secondary | ICD-10-CM | POA: Diagnosis not present

## 2018-04-13 DIAGNOSIS — I8393 Asymptomatic varicose veins of bilateral lower extremities: Secondary | ICD-10-CM

## 2018-04-13 DIAGNOSIS — L309 Dermatitis, unspecified: Secondary | ICD-10-CM

## 2018-04-13 DIAGNOSIS — J301 Allergic rhinitis due to pollen: Secondary | ICD-10-CM

## 2018-04-13 DIAGNOSIS — E039 Hypothyroidism, unspecified: Secondary | ICD-10-CM

## 2018-04-13 DIAGNOSIS — R7303 Prediabetes: Secondary | ICD-10-CM

## 2018-04-13 MED ORDER — FLUTICASONE PROPIONATE 50 MCG/ACT NA SUSP: 1.0000 | Freq: Every day | NASAL | 2 refills | Status: DC

## 2018-04-13 MED ORDER — LEVOTHYROXINE SODIUM 88 MCG PO TABS: 88.0000 ug | ORAL_TABLET | Freq: Every day | ORAL | 0 refills | Status: DC

## 2018-04-13 NOTE — Progress Notes (Signed)
Name: Maureen [name]   MRN: [id number]    DOB: [birth year]   Date:04/13/2018       Progress Note  Subjective  Chief Complaint  Chief Complaint  Patient presents with  . Medication Refill  . Depression  . Hypothyroidism  . Hyperlipidemia  . Allergic Rhinitis   . Gastroesophageal Reflux    HPI  GERD: she states taking Ranitidine at most once daily . She denies heartburn or indigestion. She is avoiding spicy food and tomato paste on her meals, she does like fried food. Doing well   Hypothyroidism: weight is stable , back to baseline, taking same dose of levothyroxine. She denies constipation, hair loss  or dry skin. She states eczema is worse in the Summer, but not having problems now   Pre-diabetes: she has changed her diet, last hgbA1C trending up a little,  she denies polyphagia, polydipsia or polyuria.   Hyperlipidemia: on life style modification only, cannot tolerate statin therapy , unable to take aspirin because it caused tinnitus in the past. Unchanged   Varicose veins: seen by Dr. Wyn Quaker, advised to wear compression stocking hoses and likely venous but may have some lymphedema. Edema is stable  Depression in remission: she is doing well, no sadness or fatigue, we will continue to monitor  Patient Active Problem List   Diagnosis Date Noted  . Hypertension 04/02/2018  . Swelling of limb 04/02/2018  . Depression, major, recurrent, mild (HCC) 12/29/2017  . Leukopenia 05/24/2017  . Allergic rhinitis, seasonal 06/29/2015  . Clavus 06/29/2015  . 1st degree AV block 06/29/2015  . Hammer toe 06/29/2015  . H/O: HTN (hypertension) 06/29/2015  . Blood glucose elevated 06/29/2015  . Floater, vitreous 06/29/2015  . Bilateral tinnitus 06/29/2015  . Hypothyroidism (acquired) 04/02/2015  . Gastroesophageal reflux disease 04/02/2015  . Hammer toe of right foot 04/02/2015  . Scoliosis of thoracic spine 04/02/2015  . Urethral prolapse 04/02/2015  . Corn of toe 04/02/2015  .  Hyperlipidemia 04/02/2015  . Varicose veins of both lower extremities 04/02/2015    Past Surgical History:  Procedure Laterality Date  . ABDOMINAL HYSTERECTOMY  1989  . APPENDECTOMY  1989  . BREAST EXCISIONAL BIOPSY Bilateral    multiple biopsies negative  . BREAST SURGERY     Several  . TUMOR EXCISION  1989   same time as hysterectomy    Family History  Problem Relation Age of Onset  . Diabetes Father 29       DM complications  . Hypertension Mother 75       CKD  . Thyroid disease Mother   . Heart failure Mother   . Kidney disease Mother   . Breast cancer Sister 8  . Kidney disease Unknown   . Breast cancer Maternal Aunt   . Breast cancer Cousin     Social History   Socioeconomic History  . Marital status: Single    Spouse name: Not on file  . Number of children: Not on file  . Years of education: Not on file  . Highest education level: Not on file  Occupational History  . Not on file  Social Needs  . Financial resource strain: Not on file  . Food insecurity:    Worry: Not on file    Inability: Not on file  . Transportation needs:    Medical: Not on file    Non-medical: Not on file  Tobacco Use  . Smoking status: Never Smoker  . Smokeless tobacco: Never Used  Substance and  Sexual Activity  . Alcohol use: Yes    Alcohol/week: 0.0 oz    Comment: occasional wine  . Drug use: No  . Sexual activity: Not Currently  Lifestyle  . Physical activity:    Days per week: Not on file    Minutes per session: Not on file  . Stress: Not on file  Relationships  . Social connections:    Talks on phone: Not on file    Gets together: Not on file    Attends religious service: Not on file    Active member of club or organization: Not on file    Attends meetings of clubs or organizations: Not on file    Relationship status: Not on file  . Intimate partner violence:    Fear of current or ex partner: Not on file    Emotionally abused: Not on file    Physically  abused: Not on file    Forced sexual activity: Not on file  Other Topics Concern  . Not on file  Social History Narrative  . Not on file     Current Outpatient Medications:  .  conjugated estrogens (PREMARIN) vaginal cream, Place 1 Applicatorful vaginally daily., Disp: 42.5 g, Rfl: 12 .  fluticasone (FLONASE) 50 MCG/ACT nasal spray, Place 1 spray into both nostrils daily., Disp: 16 g, Rfl: 2 .  Garlic Oil 1000 MG CAPS, Take 1,000 mg by mouth daily. , Disp: , Rfl:  .  levothyroxine (SYNTHROID, LEVOTHROID) 88 MCG tablet, Take 1 tablet (88 mcg total) by mouth daily before breakfast., Disp: 90 tablet, Rfl: 0 .  Multiple Vitamins-Minerals (AIR[place]NE PO), Take 1 each by mouth daily., Disp: , Rfl:  .  ranitidine (ZANTAC) 300 MG tablet, Take 1 tablet (300 mg total) by mouth at bedtime., Disp: 90 tablet, Rfl: 1 .  triamcinolone cream (KENALOG) 0.1 %, Apply 1 application topically 2 (two) times daily., Disp: 45 g, Rfl: 0 .  b complex vitamins capsule, Take 1 capsule by mouth daily. (Patient not taking: Reported on 04/13/2018), Disp: 30 capsule, Rfl: 0  Allergies  Allergen Reactions  . Aspirin     Tinnitus  . Citalopram Other (See Comments)  . Codeine Nausea And Vomiting  . Penicillins Rash    Has patient had a PCN reaction causing immediate rash, facial/tongue/throat swelling, SOB or lightheadedness with hypotension: Yes Has patient had a PCN reaction causing severe rash involving mucus membranes or skin necrosis: No Has patient had a PCN reaction that required hospitalization No Has patient had a PCN reaction occurring within the last 10 years: No If all of the above answers are "NO", then may proceed with Cephalosporin use.     ROS  Constitutional: Negative for fever or weight change.  Respiratory: Negative for cough and shortness of breath.   Cardiovascular: Negative for chest pain or palpitations.  Gastrointestinal: Negative for abdominal pain, no bowel changes.  Musculoskeletal:  Negative for gait problem or joint swelling.  Skin: Negative for rash.  Neurological: Negative for dizziness or headache.  No other specific complaints in a complete review of systems (except as listed in HPI above).  Objective  Vitals:   04/13/18 1025  BP: 134/84  Pulse: 87  Resp: 16  Temp: 98.1 F (36.7 C)  TempSrc: Oral  SpO2: 98%  Weight: 134 lb 6.4 oz (61 kg)  Height: 5\' 3"  (1.6 m)    Body mass index is 23.81 kg/m.  Physical Exam  Constitutional: Patient appears well-developed and well-nourished.  No distress.  HEENT: head atraumatic, normocephalic, pupils equal and reactive to light,  neck supple, throat within normal limits, no thyromegaly.  Cardiovascular: Normal rate, regular rhythm and normal heart sounds.  No murmur heard. No BLE edema. Pulmonary/Chest: Effort normal and breath sounds normal. No respiratory distress. Abdominal: Soft.  There is no tenderness. Psychiatric: Patient has a normal mood and affect. behavior is normal. Judgment and thought content normal.  PHQ2/9: Depression screen [facility] 2/9 04/13/2018 03/04/2018 09/22/2017 04/08/2017 10/06/2016  Decreased Interest 0 0 0 0 0  Down, Depressed, Hopeless 0 0 0 0 0  PHQ - 2 Score 0 0 0 0 0  Altered sleeping 0 0 - - -  Tired, decreased energy 0 0 - - -  Change in appetite 1 0 - - -  Feeling bad or failure about yourself  0 0 - - -  Trouble concentrating 0 0 - - -  Moving slowly or fidgety/restless 0 0 - - -  Suicidal thoughts 0 0 - - -  PHQ-9 Score 1 0 - - -  Difficult doing work/chores Not difficult at all Not difficult at all - - -    Fall Risk: Fall Risk  04/13/2018 03/04/2018 12/29/2017 09/22/2017 04/08/2017  Falls in the past year? No No No No No     Functional Status Survey: Is the patient deaf or have difficulty hearing?: No Does the patient have difficulty seeing, even when wearing glasses/contacts?: Yes(glasses) Does the patient have difficulty concentrating, remembering, or making decisions?:  No Does the patient have difficulty walking or climbing stairs?: No Does the patient have difficulty dressing or bathing?: No Does the patient have difficulty doing errands alone such as visiting a doctor's office or shopping?: No    Assessment & Plan  1. Hypothyroidism (acquired)  - levothyroxine (SYNTHROID, LEVOTHROID) 88 MCG tablet; Take 1 tablet (88 mcg total) by mouth daily before breakfast.  Dispense: 90 tablet; Refill: 0  2. Seasonal allergic rhinitis due to pollen  - fluticasone (FLONASE) 50 MCG/ACT nasal spray; Place 1 spray into both nostrils daily.  Dispense: 16 g; Refill: 2  3. Eczema, unspecified type  Continue prn medication   4. Lymphedema  Keep follow up with Dr. Wyn Quaker  5. Varicose veins of both lower extremities, unspecified whether complicated   6. Major depression in remission [facility])  Doing well at this time  7. Pre-diabetes  Discussed healthy diet again

## 2018-05-14 ENCOUNTER — Ambulatory Visit (INDEPENDENT_AMBULATORY_CARE_PROVIDER_SITE_OTHER): Payer: Medicare HMO

## 2018-05-14 VITALS — BP 122/78 | HR 78 | Temp 97.8°F | Resp 12 | Ht 63.0 in | Wt 132.5 lb

## 2018-05-14 DIAGNOSIS — Z0000 Encounter for general adult medical examination without abnormal findings: Secondary | ICD-10-CM | POA: Diagnosis not present

## 2018-05-14 NOTE — Patient Instructions (Addendum)
Maureen [name] , Thank you for taking time to come for your Medicare Wellness Visit. I appreciate your ongoing commitment to your health goals. Please review the following plan we discussed and let me know if I can assist you in the future.   Screening recommendations/referrals: Colorectal Screening: Up to date Mammogram: Up to date Bone Density: Declined  Vision and Dental Exams: Recommended annual ophthalmology exams for early detection of glaucoma and other disorders of the eye Recommended annual dental exams for proper oral hygiene  Vaccinations: Influenza vaccine: Overdue Pneumococcal vaccine: Declined Tdap vaccine: Declined. Please call your insurance company to determine your out of pocket expense. You may also receive this vaccine at your local pharmacy or Health Dept. Shingles vaccine: Please call your insurance company to determine your out of pocket expense for the Shingrix vaccine. You may also receive this vaccine at your local pharmacy or Health Dept.  Advanced directives: Advance directive discussed with you today. I have provided a copy for you to complete at home and have notarized. Once this is complete please bring a copy in to our office so we can scan it into your chart.  Goals: Recommend to drink at least 6-8 8oz glasses of water per day.  Next appointment: Please schedule your Annual Wellness Visit with your Nurse Health Advisor in one year.  Preventive Care 3265 Years and Older, Female Preventive care refers to lifestyle choices and visits with your health care provider that can promote health and wellness. What does preventive care include?  A yearly physical exam. This is also called an annual well check.  Dental exams once or twice a year.  Routine eye exams. Ask your health care provider how often you should have your eyes checked.  Personal lifestyle choices, including:  Daily care of your teeth and gums.  Regular physical activity.  Eating a healthy  diet.  Avoiding tobacco and drug use.  Limiting alcohol use.  Practicing safe sex.  Taking low-dose aspirin every day.  Taking vitamin and mineral supplements as recommended by your health care provider. What happens during an annual well check? The services and screenings done by your health care provider during your annual well check will depend on your age, overall health, lifestyle risk factors, and family history of disease. Counseling  Your health care provider may ask you questions about your:  Alcohol use.  Tobacco use.  Drug use.  Emotional well-being.  Home and relationship well-being.  Sexual activity.  Eating habits.  History of falls.  Memory and ability to understand (cognition).  Work and work Astronomerenvironment.  Reproductive health. Screening  You may have the following tests or measurements:  Height, weight, and BMI.  Blood pressure.  Lipid and cholesterol levels. These may be checked every 5 years, or more frequently if you are over 72 years old.  Skin check.  Lung cancer screening. You may have this screening every year starting at age 72 if you have a 30-pack-year history of smoking and currently smoke or have quit within the past 15 years.  Fecal occult blood test (FOBT) of the stool. You may have this test every year starting at age 72.  Flexible sigmoidoscopy or colonoscopy. You may have a sigmoidoscopy every 5 years or a colonoscopy every 10 years starting at age 72.  Hepatitis C blood test.  Hepatitis B blood test.  Sexually transmitted disease (STD) testing.  Diabetes screening. This is done by checking your blood sugar (glucose) after you have not eaten for a  while (fasting). You may have this done every 1-3 years.  Bone density scan. This is done to screen for osteoporosis. You may have this done starting at age 86.  Mammogram. This may be done every 1-2 years. Talk to your health care provider about how often you should have  regular mammograms. Talk with your health care provider about your test results, treatment options, and if necessary, the need for more tests. Vaccines  Your health care provider may recommend certain vaccines, such as:  Influenza vaccine. This is recommended every year.  Tetanus, diphtheria, and acellular pertussis (Tdap, Td) vaccine. You may need a Td booster every 10 years.  Zoster vaccine. You may need this after age 49.  Pneumococcal 13-valent conjugate (PCV13) vaccine. One dose is recommended after age 27.  Pneumococcal polysaccharide (PPSV23) vaccine. One dose is recommended after age 60. Talk to your health care provider about which screenings and vaccines you need and how often you need them. This information is not intended to replace advice given to you by your health care provider. Make sure you discuss any questions you have with your health care provider. Document Released: 11/09/2015 Document Revised: 07/02/2016 Document Reviewed: 08/14/2015 Elsevier Interactive Patient Education  2017 [employer] Prevention in the Home Falls can cause injuries. They can happen to people of all ages. There are many things you can do to make your home safe and to help prevent falls. What can I do on the outside of my home?  Regularly fix the edges of walkways and driveways and fix any cracks.  Remove anything that might make you trip as you walk through a door, such as a raised step or threshold.  Trim any bushes or trees on the path to your home.  Use bright outdoor lighting.  Clear any walking paths of anything that might make someone trip, such as rocks or tools.  Regularly check to see if handrails are loose or broken. Make sure that both sides of any steps have handrails.  Any raised decks and porches should have guardrails on the edges.  Have any leaves, snow, or ice cleared regularly.  Use sand or salt on walking paths during winter.  Clean up any spills in your  garage right away. This includes oil or grease spills. What can I do in the bathroom?  Use night lights.  Install grab bars by the toilet and in the tub and shower. Do not use towel bars as grab bars.  Use non-skid mats or decals in the tub or shower.  If you need to sit down in the shower, use a plastic, non-slip stool.  Keep the floor dry. Clean up any water that spills on the floor as soon as it happens.  Remove soap buildup in the tub or shower regularly.  Attach bath mats securely with double-sided non-slip rug tape.  Do not have throw rugs and other things on the floor that can make you trip. What can I do in the bedroom?  Use night lights.  Make sure that you have a light by your bed that is easy to reach.  Do not use any sheets or blankets that are too big for your bed. They should not hang down onto the floor.  Have a firm chair that has side arms. You can use this for support while you get dressed.  Do not have throw rugs and other things on the floor that can make you trip. What can I do in the  kitchen?  Clean up any spills right away.  Avoid walking on wet floors.  Keep items that you use a lot in easy-to-reach places.  If you need to reach something above you, use a strong step stool that has a grab bar.  Keep electrical cords out of the way.  Do not use floor polish or wax that makes floors slippery. If you must use wax, use non-skid floor wax.  Do not have throw rugs and other things on the floor that can make you trip. What can I do with my stairs?  Do not leave any items on the stairs.  Make sure that there are handrails on both sides of the stairs and use them. Fix handrails that are broken or loose. Make sure that handrails are as long as the stairways.  Check any carpeting to make sure that it is firmly attached to the stairs. Fix any carpet that is loose or worn.  Avoid having throw rugs at the top or bottom of the stairs. If you do have throw  rugs, attach them to the floor with carpet tape.  Make sure that you have a light switch at the top of the stairs and the bottom of the stairs. If you do not have them, ask someone to add them for you. What else can I do to help prevent falls?  Wear shoes that:  Do not have high heels.  Have rubber bottoms.  Are comfortable and fit you well.  Are closed at the toe. Do not wear sandals.  If you use a stepladder:  Make sure that it is fully opened. Do not climb a closed stepladder.  Make sure that both sides of the stepladder are locked into place.  Ask someone to hold it for you, if possible.  Clearly mark and make sure that you can see:  Any grab bars or handrails.  First and last steps.  Where the edge of each step is.  Use tools that help you move around (mobility aids) if they are needed. These include:  Canes.  Walkers.  Scooters.  Crutches.  Turn on the lights when you go into a dark area. Replace any light bulbs as soon as they burn out.  Set up your furniture so you have a clear path. Avoid moving your furniture around.  If any of your floors are uneven, fix them.  If there are any pets around you, be aware of where they are.  Review your medicines with your doctor. Some medicines can make you feel dizzy. This can increase your chance of falling. Ask your doctor what other things that you can do to help prevent falls. This information is not intended to replace advice given to you by your health care provider. Make sure you discuss any questions you have with your health care provider. Document Released: 08/09/2009 Document Revised: 03/20/2016 Document Reviewed: 11/17/2014 Elsevier Interactive Patient Education  2017 [employer].

## 2018-05-14 NOTE — Progress Notes (Addendum)
Subjective:   Maureen [name] is a 72 y.o. female who presents for Medicare Annual (Subsequent) preventive examination.  Review of Systems:  N/A Cardiac Risk Factors include: advanced age (>99men, >32 women);dyslipidemia;hypertension     Objective:     Vitals: BP 122/78 (BP Location: Right Arm, Patient Position: Sitting, Cuff Size: Normal)   Pulse 78   Temp 97.8 F (36.6 C) (Oral)   Resp 12   Ht 5\' 3"  (1.6 m)   Wt 132 lb 8 oz (60.1 kg)   SpO2 96%   BMI 23.47 kg/m   Body mass index is 23.47 kg/m.  Advanced Directives 05/14/2018 04/08/2017 10/06/2016 08/12/2016 06/02/2016 05/31/2016 05/07/2016  Does Patient Have a Medical Advance Directive? No No Yes Yes Yes No No  Type of Advance Directive - - Healthcare Power of Attorney Living will Living will - -  Does patient want to make changes to medical advance directive? - - - No - Patient declined No - Patient declined - -  Copy of Healthcare Power of Attorney in Chart? - - No - copy requested No - copy requested No - copy requested - -  Would patient like information on creating a medical advance directive? Yes (MAU/Ambulatory/Procedural Areas - Information given) - - - No - patient declined information No - patient declined information No - patient declined information    Tobacco Social History   Tobacco Use  Smoking Status Never Smoker  Smokeless Tobacco Never Used  Tobacco Comment   smoking cessation materials not required     Counseling given: No Comment: smoking cessation materials not required  Clinical Intake:  Pre-visit preparation completed: Yes  Pain : No/denies pain   BMI - recorded: 23.47 Nutritional Status: BMI of 19-24  Normal Nutritional Risks: None Diabetes: No  How often do you need to have someone help you when you read instructions, pamphlets, or other written materials from your doctor or pharmacy?: 1 - Never  Interpreter Needed?: No  Information entered by :: AEversole, LPN  Past Medical History:   Diagnosis Date  . Allergy   . Corn of toe   . First degree AV block   . GERD (gastroesophageal reflux disease)   . Hammer toe of right foot   . Hyperglycemia   . Hyperlipidemia   . Hypertension   . Loss of appetite   . Prolapse of urethra   . Scoliosis (and kyphoscoliosis), idiopathic   . Thyroid disease   . Tinnitus of both ears   . Vitreous floaters of right eye    Past Surgical History:  Procedure Laterality Date  . ABDOMINAL HYSTERECTOMY  1989  . APPENDECTOMY  1989  . BREAST EXCISIONAL BIOPSY Bilateral    multiple biopsies negative  . BREAST SURGERY     Several  . TUMOR EXCISION  1989   same time as hysterectomy   Family History  Problem Relation Age of Onset  . Diabetes Father 9       DM complications  . Hypertension Mother 56       CKD  . Thyroid disease Mother   . Heart failure Mother   . Kidney disease Mother   . Breast cancer Sister 71  . Kidney disease Unknown   . Breast cancer Maternal Aunt   . Breast cancer Cousin    Social History   Socioeconomic History  . Marital status: Divorced    Spouse name: Not on file  . Number of children: 3  . Years of education: Not  on file  . Highest education level: 12th grade  Occupational History  . Occupation: Retired  Engineer, production  . Financial resource strain: Not hard at all  . Food insecurity:    Worry: Never true    Inability: Never true  . Transportation needs:    Medical: No    Non-medical: No  Tobacco Use  . Smoking status: Never Smoker  . Smokeless tobacco: Never Used  . Tobacco comment: smoking cessation materials not required  Substance and Sexual Activity  . Alcohol use: Not Currently    Alcohol/week: 0.0 oz  . Drug use: No  . Sexual activity: Not Currently  Lifestyle  . Physical activity:    Days per week: 5 days    Minutes per session: 30 min  . Stress: Not at all  Relationships  . Social connections:    Talks on phone: Patient refused    Gets together: Patient refused    Attends  religious service: Patient refused    Active member of club or organization: Patient refused    Attends meetings of clubs or organizations: Patient refused    Relationship status: Patient refused  Other Topics Concern  . Not on file  Social History Narrative  . Not on file    Outpatient Encounter Medications as of 05/14/2018  Medication Sig  . b complex vitamins capsule Take 1 capsule by mouth daily.  Marland Kitchen conjugated estrogens (PREMARIN) vaginal cream Place 1 Applicatorful vaginally daily.  . fluticasone (FLONASE) 50 MCG/ACT nasal spray Place 1 spray into both nostrils daily.  . Garlic Oil 1000 MG CAPS Take 1,000 mg by mouth daily.   Marland Kitchen levothyroxine (SYNTHROID, LEVOTHROID) 88 MCG tablet Take 1 tablet (88 mcg total) by mouth daily before breakfast.  . Multiple Vitamins-Minerals (AIRBORNE PO) Take 1 each by mouth daily.  . ranitidine (ZANTAC) 300 MG tablet Take 1 tablet (300 mg total) by mouth at bedtime.  . triamcinolone cream (KENALOG) 0.1 % Apply 1 application topically 2 (two) times daily.   No facility-administered encounter medications on file as of 05/14/2018.     Activities of Daily Living In your present state of health, do you have any difficulty performing the following activities: 05/14/2018 04/13/2018  Hearing? N N  Comment denies hearing aids; tinnitus -  Vision? N Y  Comment wears eyeglasses glasses  Difficulty concentrating or making decisions? N N  Walking or climbing stairs? N N  Dressing or bathing? N N  Doing errands, shopping? N N  Preparing Food and eating ? N -  Comment full set upper and lower dentures -  Using the Toilet? N -  In the past six months, have you accidently leaked urine? N -  Do you have problems with loss of bowel control? N -  Managing your Medications? N -  Managing your Finances? N -  Housekeeping or managing your Housekeeping? N -  Some recent data might be hidden    Patient Care Team: Alba Cory, MD as PCP - General (Family  Medicine) Isla Pence, OD as Consulting Physician (Optometry) Wyn Quaker, Marlow Baars, MD as Consulting Physician (Vascular Surgery)    Assessment:   This is a routine wellness examination for Brennyn.  Exercise Activities and Dietary recommendations Current Exercise Habits: Home exercise routine, Type of exercise: walking, Time (Minutes): 30, Frequency (Times/Week): 5, Weekly Exercise (Minutes/Week): 150, Intensity: Mild, Exercise limited by: None identified  Goals    . DIET - INCREASE WATER INTAKE     Recommend to drink at  least 6-8 8oz glasses of water per day.       Fall Risk Fall Risk  05/14/2018 04/13/2018 03/04/2018 12/29/2017 09/22/2017  Falls in the past year? No No No No No  Risk for fall due to : Impaired vision;Other (Comment) - - - -  Risk for fall due to: Comment wears eyeglasses; tinnitus - - - -   FALL RISK PREVENTION PERTAINING TO HOME: Is your home free of loose throw rugs in walkways, pet beds, electrical cords, etc? Yes Is there adequate lighting in your home to reduce risk of falls?  Yes Are there stairs in or around your home WITH handrails? Yes  ASSISTIVE DEVICES UTILIZED TO PREVENT FALLS: Use of a cane, walker or w/c? No Grab bars in the bathroom? Yes Shower chair or a place to sit while bathing? No An elevated toilet seat or a handicapped toilet? No  Timed Get Up and Go Performed: Yes. Pt ambulated 10 feet within 11 sec. Gait slow, steady and without the use of an assistive device. No intervention required at this time. Fall risk prevention has been discussed.  Community Resource Referral:  Pt declined my offer to send [employer] Referral to Care Guide for a shower chair or an elevated toilet seat.  Depression Screen PHQ 2/9 Scores 05/14/2018 04/13/2018 03/04/2018 09/22/2017  PHQ - 2 Score 0 0 0 0  PHQ- 9 Score 0 1 0 -     Cognitive Function     6CIT Screen 05/14/2018  What Year? 0 points  What month? 0 points  What time? 0 points  Count back from  20 0 points  Months in reverse 4 points  Repeat phrase 10 points  Total Score 14    There is no immunization history for the selected administration types on file for this patient.  Qualifies for Shingles Vaccine? Yes. Due for Shingrix. Education has been provided regarding the importance of this vaccine. Pt has been advised to call insurance company to determine out of pocket expense. Advised may also receive vaccine at local pharmacy or Health Dept. Verbalized acceptance and understanding.  Overdue for Flu vaccine. Education has been provided regarding the importance of this vaccine and advised to receive when available. Verbalized acceptance and understanding.  Due for Pneumoccocal vaccine. Declined my offer to administer today. Education has been provided regarding the importance of this vaccine but still declined. Advised may receive this vaccine at local pharmacy or Health Dept. Aware to provide a copy of the vaccination record if obtained from local pharmacy or Health Dept. Verbalized acceptance and understanding.  Due for Tdap vaccine. Education has been provided regarding the importance of this vaccine. Advised may receive this vaccine at local pharmacy or Health Dept. Aware to provide a copy of the vaccination record if obtained from local pharmacy or Health Dept. Verbalized acceptance and understanding.  Screening Tests Health Maintenance  Topic Date Due  . TETANUS/TDAP  03/04/2019 (Originally 11/12/1964)  . PNA vac Low Risk Adult (1 of 2 - PCV13) 03/04/2019 (Originally 11/12/2010)  . DEXA SCAN  08/18/2029 (Originally 1946/08/03)  . INFLUENZA VACCINE  05/27/2018  . MAMMOGRAM  12/07/2018  . COLONOSCOPY  06/23/2021  . Hepatitis C Screening  Completed    Cancer Screenings: Lung: Low Dose CT Chest recommended if Age 80-80 years, 30 pack-year currently smoking OR have quit w/in 15years. Patient does not qualify. Breast:  Up to date on Mammogram? Yes. Completed 12/07/17. Repeat every  year.   Up to date of Bone Density/Dexa?  No. Ordered 04/08/17. Unable to locate report. Pt confirmed she had not completed exam. Declined my offer exam today. Education provided re: importance of this exam but still declined.  Colorectal: Completed 06/25/11. Repeat every 10 years  Additional Screenings: Hepatitis C Screening: Completed 04/26/12    Plan:  I have personally reviewed and addressed the Medicare Annual Wellness questionnaire and have noted the following in the patient's chart:  A. Medical and social history B. Use of alcohol, tobacco or illicit drugs  C. Current medications and supplements D. Functional ability and status E.  Nutritional status F.  Physical activity G. Advance directives H. List of other physicians I.  Hospitalizations, surgeries, and ER visits in previous 12 months J.  Vitals K. Screenings such as hearing and vision if needed, cognitive and depression L. Referrals and appointments  In addition, I have reviewed and discussed with patient certain preventive protocols, quality metrics, and best practice recommendations. A written personalized care plan for preventive services as well as general preventive health recommendations were provided to patient.  See attached scanned questionnaire for additional information.   Signed,  Deon PillingAmmie Keysean Savino, LPN Nurse Health Advisor  I have reviewed this encounter including the documentation in this note and/or discussed this patient with the provider, Deon PillingAmmie Trista Ciocca, LPN. I am certifying that I agree with the content of this note as supervising physician.  Alba CoryKrichna Sowles, MD [facility] 05/14/2018, 1:31 PM

## 2018-07-05 ENCOUNTER — Ambulatory Visit (INDEPENDENT_AMBULATORY_CARE_PROVIDER_SITE_OTHER): Payer: Medicare HMO | Admitting: Vascular Surgery

## 2018-07-05 ENCOUNTER — Encounter (INDEPENDENT_AMBULATORY_CARE_PROVIDER_SITE_OTHER): Payer: Self-pay | Admitting: Vascular Surgery

## 2018-07-05 ENCOUNTER — Encounter (INDEPENDENT_AMBULATORY_CARE_PROVIDER_SITE_OTHER): Payer: Self-pay

## 2018-07-05 ENCOUNTER — Ambulatory Visit (INDEPENDENT_AMBULATORY_CARE_PROVIDER_SITE_OTHER): Payer: Medicare HMO

## 2018-07-05 VITALS — BP 139/87 | HR 85 | Resp 13 | Ht 62.0 in | Wt 134.0 lb

## 2018-07-05 DIAGNOSIS — I83813 Varicose veins of bilateral lower extremities with pain: Secondary | ICD-10-CM

## 2018-07-05 DIAGNOSIS — I83811 Varicose veins of right lower extremities with pain: Secondary | ICD-10-CM | POA: Diagnosis not present

## 2018-07-05 DIAGNOSIS — I872 Venous insufficiency (chronic) (peripheral): Secondary | ICD-10-CM | POA: Diagnosis not present

## 2018-07-05 DIAGNOSIS — I890 Lymphedema, not elsewhere classified: Secondary | ICD-10-CM | POA: Diagnosis not present

## 2018-07-05 DIAGNOSIS — I83812 Varicose veins of left lower extremities with pain: Secondary | ICD-10-CM | POA: Diagnosis not present

## 2018-07-05 NOTE — Progress Notes (Signed)
Subjective:    Patient ID: Maureen [name], female    DOB: 1946-02-27, 72 y.o.   MRN: [id number] Chief Complaint  Patient presents with  . Follow-up    3 month reflux   Patient presents to review vascular studies.  She last seen on April 02, 2018 for evaluation of bilateral lower extremity painful varicose veins and edema.  Since her initial visit the patient has been engaging in conservative therapy including wearing medical grade 1 compression socks, elevating her legs and remaining active.  The patient feels that this has made an improvement to her symptoms.  The patient notes an improvement in the edema to her bilateral legs.  The patient notes an improvement to the discomfort she was experiencing along her varicosities.  The patient underwent a bilateral lower extremity venous reflux study which was notable for reflux in the bilateral common femoral vein, and bilateral great saphenous vein just at the knee.  There is no evidence of deep vein thrombosis or superficial thrombophlebitis.  The patient denies any bilateral lower extremity pain or ulcer formation.  The patient denies any fever, nausea vomiting.  Review of Systems  Constitutional: Negative.   HENT: Negative.   Eyes: Negative.   Respiratory: Negative.   Cardiovascular: Positive for leg swelling.       Varicose Veins  Gastrointestinal: Negative.   Endocrine: Negative.   Genitourinary: Negative.   Musculoskeletal: Negative.   Skin: Negative.   Allergic/Immunologic: Negative.   Neurological: Negative.   Hematological: Negative.   Psychiatric/Behavioral: Negative.       Objective:   Physical Exam  Constitutional: She is oriented to person, place, and time. She appears well-developed and well-nourished. No distress.  HENT:  Head: Normocephalic and atraumatic.  Right Ear: External ear normal.  Left Ear: External ear normal.  Eyes: Pupils are equal, round, and reactive to light. Conjunctivae and EOM are normal.  Neck:  Normal range of motion.  Cardiovascular: Normal rate, regular rhythm, normal heart sounds and intact distal pulses.  Pulmonary/Chest: Effort normal and breath sounds normal.  Musculoskeletal: Normal range of motion. She exhibits no edema (Minimal bilateral lower extremity edema noted.  Nonpitting.).  Neurological: She is alert and oriented to person, place, and time.  Skin: Skin is warm and dry. She is not diaphoretic.  Psychiatric: She has a normal mood and affect. Her behavior is normal. Judgment and thought content normal.  Vitals reviewed.  BP 139/87 (BP Location: Right Arm, Patient Position: Sitting)   Pulse 85   Resp 13   Ht 5\' 2"  (1.575 m)   Wt 134 lb (60.8 kg)   BMI 24.51 kg/m   Past Medical History:  Diagnosis Date  . Allergy   . Corn of toe   . First degree AV block   . GERD (gastroesophageal reflux disease)   . Hammer toe of right foot   . Hyperglycemia   . Hyperlipidemia   . Hypertension   . Loss of appetite   . Prolapse of urethra   . Scoliosis (and kyphoscoliosis), idiopathic   . Thyroid disease   . Tinnitus of both ears   . Vitreous floaters of right eye    Social History   Socioeconomic History  . Marital status: Divorced    Spouse name: Not on file  . Number of children: 3  . Years of education: Not on file  . Highest education level: 12th grade  Occupational History  . Occupation: Retired  Engineer, production  . Financial resource strain:  Not hard at all  . Food insecurity:    Worry: Never true    Inability: Never true  . Transportation needs:    Medical: No    Non-medical: No  Tobacco Use  . Smoking status: Never Smoker  . Smokeless tobacco: Never Used  . Tobacco comment: smoking cessation materials not required  Substance and Sexual Activity  . Alcohol use: Not Currently    Alcohol/week: 0.0 standard drinks  . Drug use: No  . Sexual activity: Not Currently  Lifestyle  . Physical activity:    Days per week: 5 days    Minutes per session: 30  min  . Stress: Not at all  Relationships  . Social connections:    Talks on phone: Patient refused    Gets together: Patient refused    Attends religious service: Patient refused    Active member of club or organization: Patient refused    Attends meetings of clubs or organizations: Patient refused    Relationship status: Patient refused  . Intimate partner violence:    Fear of current or ex partner: No    Emotionally abused: No    Physically abused: No    Forced sexual activity: No  Other Topics Concern  . Not on file  Social History Narrative  . Not on file   Past Surgical History:  Procedure Laterality Date  . ABDOMINAL HYSTERECTOMY  1989  . APPENDECTOMY  1989  . BREAST EXCISIONAL BIOPSY Bilateral    multiple biopsies negative  . BREAST SURGERY     Several  . TUMOR EXCISION  1989   same time as hysterectomy   Family History  Problem Relation Age of Onset  . Diabetes Father 70       DM complications  . Hypertension Mother 41       CKD  . Thyroid disease Mother   . Heart failure Mother   . Kidney disease Mother   . Breast cancer Sister 43  . Kidney disease Unknown   . Breast cancer Maternal Aunt   . Breast cancer Cousin    Allergies  Allergen Reactions  . Aspirin     Tinnitus  . Citalopram Other (See Comments)  . Codeine Nausea And Vomiting  . Penicillins Rash    Has patient had a PCN reaction causing immediate rash, facial/tongue/throat swelling, SOB or lightheadedness with hypotension: Yes Has patient had a PCN reaction causing severe rash involving mucus membranes or skin necrosis: No Has patient had a PCN reaction that required hospitalization No Has patient had a PCN reaction occurring within the last 10 years: No If all of the above answers are "NO", then may proceed with Cephalosporin use.      Assessment & Plan:  Patient presents to review vascular studies.  She last seen on April 02, 2018 for evaluation of bilateral lower extremity painful varicose  veins and edema.  Since her initial visit the patient has been engaging in conservative therapy including wearing medical grade 1 compression socks, elevating her legs and remaining active.  The patient feels that this has made an improvement to her symptoms.  The patient notes an improvement in the edema to her bilateral legs.  The patient notes an improvement to the discomfort she was experiencing along her varicosities.  The patient underwent a bilateral lower extremity venous reflux study which was notable for reflux in the bilateral common femoral vein, and bilateral great saphenous vein just at the knee.  There is no evidence of deep  vein thrombosis or superficial thrombophlebitis.  The patient denies any bilateral lower extremity pain or ulcer formation.  The patient denies any fever, nausea vomiting.  1. Chronic venous insufficiency - New Due to the location of the patient's chronic venous insufficiency being located in the bilateral common femoral vein she is not a candidate for endovenous laser ablation I explained to the patient why in her anatomy and why she is unable to undergo the laser procedure. The patient expresses her understanding  2. Lymphedema - New The patient would be a candidate for a lymphedema pump however at this time she feels that conservative therapy including wearing medical grade 1 compression, elevating her legs and remaining active have controlled her symptoms The patient would like to follow-up in 6 months for [place] to you to surveilled her progress with conservative therapy The patient is to follow-up in 6 months Patient is to continue engaging conservative therapy The patient was instructed to call the office in the interim if any worsening edema or ulcerations to the legs, feet or toes occurs. The patient expresses their understanding.  Current Outpatient Medications on File Prior to Visit  Medication Sig Dispense Refill  . b complex vitamins capsule Take 1  capsule by mouth daily. 30 capsule 0  . conjugated estrogens (PREMARIN) vaginal cream Place 1 Applicatorful vaginally daily. 42.5 g 12  . fluticasone (FLONASE) 50 MCG/ACT nasal spray Place 1 spray into both nostrils daily. 16 g 2  . Garlic 1000 MG CAPS Take by mouth.    . levothyroxine (SYNTHROID, LEVOTHROID) 88 MCG tablet Take 1 tablet (88 mcg total) by mouth daily before breakfast. 90 tablet 0  . Multiple Vitamins-Minerals (AIRBORNE PO) Take 1 each by mouth daily.    . ranitidine (ZANTAC) 300 MG tablet Take 1 tablet (300 mg total) by mouth at bedtime. 90 tablet 1  . triamcinolone cream (KENALOG) 0.1 % Apply 1 application topically 2 (two) times daily. 45 g 0   No current facility-administered medications on file prior to visit.    There are no Patient Instructions on file for this visit. No follow-ups on file.  Baran Kuhrt A Polk Minor, PA-C

## 2018-07-14 ENCOUNTER — Ambulatory Visit: Payer: Medicare HMO | Admitting: Family Medicine

## 2018-07-26 ENCOUNTER — Telehealth: Payer: Self-pay

## 2018-07-26 NOTE — Telephone Encounter (Signed)
Copied from CRM [id number]. Topic: Inquiry >> Jul 26, 2018 12:08 PM Windy Kalata, NT wrote: Reason for CRM: patient has a physical scheduled on 07/30/18 and states she would like for her pcp or pcp nurse to give her a call before her appointment.  Patient called stating that when she comes for her physical, she would like to have a PAP smear. Patient stated that she has a family hx of cancer and wanted to be on the safe side since she has had some bleeding before.

## 2018-07-26 NOTE — Telephone Encounter (Signed)
Based on her age medicare may not pay for her pap smear, but she can call them to verify. I can collect pap as requested

## 2018-07-27 NOTE — Telephone Encounter (Signed)
Patient stated that she will pay as long as she can have it done.

## 2018-07-30 ENCOUNTER — Encounter: Payer: Self-pay | Admitting: Family Medicine

## 2018-07-30 ENCOUNTER — Ambulatory Visit (INDEPENDENT_AMBULATORY_CARE_PROVIDER_SITE_OTHER): Payer: Medicare HMO | Admitting: Family Medicine

## 2018-07-30 VITALS — BP 126/78 | HR 90 | Temp 97.7°F | Resp 16 | Ht 62.0 in | Wt 132.5 lb

## 2018-07-30 DIAGNOSIS — E039 Hypothyroidism, unspecified: Secondary | ICD-10-CM

## 2018-07-30 DIAGNOSIS — Z01419 Encounter for gynecological examination (general) (routine) without abnormal findings: Secondary | ICD-10-CM | POA: Diagnosis not present

## 2018-07-30 DIAGNOSIS — R739 Hyperglycemia, unspecified: Secondary | ICD-10-CM

## 2018-07-30 DIAGNOSIS — E2839 Other primary ovarian failure: Secondary | ICD-10-CM

## 2018-07-30 DIAGNOSIS — Z23 Encounter for immunization: Secondary | ICD-10-CM

## 2018-07-30 NOTE — Progress Notes (Signed)
Name: Maureen [name]   MRN: [id number]    DOB: 1946-07-17   Date:07/30/2018       Progress Note  Subjective  Chief Complaint  Chief Complaint  Patient presents with  . Annual Exam    HPI   Patient presents for annual CPE and follow up  Hypothyroidism: she is taking levothyroxine 88 mcg daily , no hair loss, dry skin, change in bowel movements. We will recheck levels  Varicose veins and lymphedema: seen by Dr. Wyn Quaker, and was advised to wear compression stocking hoses. She still has intermittent lower extremity edema, but not daily. Doing better when she wears compression stocking hoses.   Depression:  Depression screen [facility] 2/9 07/30/2018 05/14/2018 04/13/2018 03/04/2018 09/22/2017  Decreased Interest 0 0 0 0 0  Down, Depressed, Hopeless 0 0 0 0 0  PHQ - 2 Score 0 0 0 0 0  Altered sleeping - 0 0 0 -  Tired, decreased energy - 0 0 0 -  Change in appetite - 0 1 0 -  Feeling bad or failure about yourself  - 0 0 0 -  Trouble concentrating - 0 0 0 -  Moving slowly or fidgety/restless - 0 0 0 -  Suicidal thoughts - 0 0 0 -  PHQ-9 Score - 0 1 0 -  Difficult doing work/chores - Not difficult at all Not difficult at all Not difficult at all -   Hypertension: BP Readings from Last 3 Encounters:  07/30/18 126/78  07/05/18 139/87  05/14/18 122/78   Obesity: Wt Readings from Last 3 Encounters:  07/30/18 132 lb 8 oz (60.1 kg)  07/05/18 134 lb (60.8 kg)  05/14/18 132 lb 8 oz (60.1 kg)   BMI Readings from Last 3 Encounters:  07/30/18 24.23 kg/m  07/05/18 24.51 kg/m  05/14/18 23.47 kg/m    Hep C Screening: done in 2013  STD testing and prevention (HIV/chl/gon/syphilis): she refuses it Intimate partner violence: negative Sexual History/Pain during Intercourse: not currently  Menstrual History/LMP/Abnormal Bleeding: s/p hysterectomy  Incontinence Symptoms: none   Advanced Care Planning: A voluntary discussion about advance care planning including the explanation and discussion of  advance directives.  Discussed health care proxy and Living will, and the patient was able to identify a health care proxy as her children  .  Patient does not have a living will at present time.  Breast cancer: due in Feb 2020    Cervical cancer screening: she requested to have it done, explained not indicated since she had a hysterectomy and is over 72 , she requested and is aware that insurance may not pay for it.   Osteoporosis Screening: she will call for bone density test   Lipids:  Lab Results  Component Value Date   CHOL 195 01/08/2018   CHOL 193 01/20/2017   CHOL 185 03/14/2016   Lab Results  Component Value Date   HDL 45 (L) 01/08/2018   HDL 41 (L) 01/20/2017   HDL 44 03/14/2016   Lab Results  Component Value Date   LDLCALC 136 (H) 01/08/2018   LDLCALC 135 (H) 01/20/2017   LDLCALC 129 (H) 03/14/2016   Lab Results  Component Value Date   TRIG 50 01/08/2018   TRIG 85 01/20/2017   TRIG 58 03/14/2016   Lab Results  Component Value Date   CHOLHDL 4.3 01/08/2018   CHOLHDL 4.7 01/20/2017   CHOLHDL 4.2 03/14/2016   No results found for: LDLDIRECT  Glucose:  Glucose  Date Value Ref Range Status  07/10/2012 120 (H) 65 - 99 mg/dL Final  82/95/6213 086 (H) 65 - 99 mg/dL Final   Glucose, Bld  Date Value Ref Range Status  01/08/2018 83 65 - 99 mg/dL Final    Comment:    .            Fasting reference interval .   05/22/2017 75 65 - 99 mg/dL Final  57/84/6962 84 65 - 99 mg/dL Final    Skin cancer: discussed atypical lesions Colorectal cancer: up to date  Lung cancer:   Low Dose CT Chest recommended if Age 61-80 years, 30 pack-year currently smoking OR have quit w/in 15years. Patient does not qualify.   ECG: 2017  Patient Active Problem List   Diagnosis Date Noted  . Chronic venous insufficiency 07/05/2018  . Lymphedema 07/05/2018  . Hypertension 04/02/2018  . Swelling of limb 04/02/2018  . Depression, major, recurrent, mild (HCC) 12/29/2017  .  Leukopenia 05/24/2017  . Allergic rhinitis, seasonal 06/29/2015  . Clavus 06/29/2015  . 1st degree AV block 06/29/2015  . Hammer toe 06/29/2015  . H/O: HTN (hypertension) 06/29/2015  . Blood glucose elevated 06/29/2015  . Floater, vitreous 06/29/2015  . Bilateral tinnitus 06/29/2015  . Hypothyroidism (acquired) 04/02/2015  . Gastroesophageal reflux disease 04/02/2015  . Hammer toe of right foot 04/02/2015  . Scoliosis of thoracic spine 04/02/2015  . Urethral prolapse 04/02/2015  . Corn of toe 04/02/2015  . Hyperlipidemia 04/02/2015  . Varicose veins of both lower extremities 04/02/2015    Past Surgical History:  Procedure Laterality Date  . ABDOMINAL HYSTERECTOMY  1989  . APPENDECTOMY  1989  . BREAST EXCISIONAL BIOPSY Bilateral    multiple biopsies negative  . BREAST SURGERY     Several  . TUMOR EXCISION  1989   same time as hysterectomy    Family History  Problem Relation Age of Onset  . Diabetes Father 64       DM complications  . Hypertension Mother 64       CKD  . Thyroid disease Mother   . Heart failure Mother   . Kidney disease Mother   . Breast cancer Sister 27  . Kidney disease Unknown   . Breast cancer Maternal Aunt   . Breast cancer Cousin   . Anuerysm Son 51  . Diabetes Son     Social History   Socioeconomic History  . Marital status: Widowed    Spouse name: Not on file  . Number of children: 3  . Years of education: 66  . Highest education level: 12th grade  Occupational History  . Occupation: Retired  Engineer, production  . Financial resource strain: Not hard at all  . Food insecurity:    Worry: Never true    Inability: Never true  . Transportation needs:    Medical: No    Non-medical: No  Tobacco Use  . Smoking status: Never Smoker  . Smokeless tobacco: Never Used  . Tobacco comment: smoking cessation materials not required  Substance and Sexual Activity  . Alcohol use: Never    Alcohol/week: 0.0 standard drinks    Frequency: Never  .  Drug use: No  . Sexual activity: Not Currently    Partners: Male  Lifestyle  . Physical activity:    Days per week: 5 days    Minutes per session: 30 min  . Stress: Not at all  Relationships  . Social connections:    Talks on phone: More than three times a week  Gets together: More than three times a week    Attends religious service: More than 4 times per year    Active member of club or organization: Yes    Attends meetings of clubs or organizations: More than 4 times per year    Relationship status: Widowed  . Intimate partner violence:    Fear of current or ex partner: No    Emotionally abused: No    Physically abused: No    Forced sexual activity: No  Other Topics Concern  . Not on file  Social History Narrative  . Not on file     Current Outpatient Medications:  .  b complex vitamins capsule, Take 1 capsule by mouth daily., Disp: 30 capsule, Rfl: 0 .  conjugated estrogens (PREMARIN) vaginal cream, Place 1 Applicatorful vaginally daily., Disp: 42.5 g, Rfl: 12 .  fluticasone (FLONASE) 50 MCG/ACT nasal spray, Place 1 spray into both nostrils daily., Disp: 16 g, Rfl: 2 .  Garlic 1000 MG CAPS, Take by mouth., Disp: , Rfl:  .  levothyroxine (SYNTHROID, LEVOTHROID) 88 MCG tablet, Take 1 tablet (88 mcg total) by mouth daily before breakfast., Disp: 90 tablet, Rfl: 0 .  ranitidine (ZANTAC) 300 MG tablet, Take 1 tablet (300 mg total) by mouth at bedtime., Disp: 90 tablet, Rfl: 1 .  triamcinolone cream (KENALOG) 0.1 %, Apply 1 application topically 2 (two) times daily., Disp: 45 g, Rfl: 0 .  Multiple Vitamins-Minerals (AIRBORNE PO), Take 1 each by mouth daily., Disp: , Rfl:   Allergies  Allergen Reactions  . Aspirin     Tinnitus  . Citalopram Other (See Comments)  . Codeine Nausea And Vomiting  . Penicillins Rash    Has patient had a PCN reaction causing immediate rash, facial/tongue/throat swelling, SOB or lightheadedness with hypotension: Yes Has patient had a PCN  reaction causing severe rash involving mucus membranes or skin necrosis: No Has patient had a PCN reaction that required hospitalization No Has patient had a PCN reaction occurring within the last 10 years: No If all of the above answers are "NO", then may proceed with Cephalosporin use.     ROS  Constitutional: Negative for fever or weight change.  Respiratory: Negative for cough and shortness of breath.   Cardiovascular: Negative for chest pain or palpitations.  Gastrointestinal: Negative for abdominal pain, no bowel changes.  Musculoskeletal: Negative for gait problem or joint swelling.  Skin: Negative for rash.  Neurological: Negative for dizziness or headache.  No other specific complaints in a complete review of systems (except as listed in HPI above).   Objective  Vitals:   07/30/18 0919  BP: 126/78  Pulse: 90  Resp: 16  Temp: 97.7 F (36.5 C)  TempSrc: Oral  SpO2: 98%  Weight: 132 lb 8 oz (60.1 kg)  Height: 5\' 2"  (1.575 m)    Body mass index is 24.23 kg/m.  Physical Exam  Constitutional: Patient appears well-developed and well-nourished. No distress.  HENT: Head: Normocephalic and atraumatic. Ears: B TMs ok, no erythema or effusion; Nose: Nose normal. Mouth/Throat: Oropharynx is clear and moist. No oropharyngeal exudate.  Eyes: Conjunctivae and EOM are normal. Pupils are equal, round, and reactive to light. No scleral icterus.  Neck: Normal range of motion. Neck supple. No JVD present. No thyromegaly present.  Cardiovascular: Normal rate, regular rhythm and normal heart sounds.  No murmur heard. No BLE edema.Spider veins both lower leg Pulmonary/Chest: Effort normal and breath sounds normal. No respiratory distress. Abdominal: Soft. Bowel sounds are  normal, no distension. There is no tenderness. no masses Breast: no lumps or masses, no nipple discharge or rashes FEMALE GENITALIA:  External genitalia normal External urethra normal Vagina: atrophy, cul-de-sac  showed some erythematous spots Bimanual exam normal without masses RECTAL: not done Musculoskeletal: Normal range of motion, no joint effusions. Scoliosis present  Neurological: he is alert and oriented to person, place, and time. No cranial nerve deficit. Coordination, balance, strength, speech and gait are normal.  Skin: Skin is warm and dry. No rash noted. No erythema.  Psychiatric: Patient has a normal mood and affect. behavior is normal. Judgment and thought content normal.  PHQ2/9: Depression screen [facility] 2/9 07/30/2018 05/14/2018 04/13/2018 03/04/2018 09/22/2017  Decreased Interest 0 0 0 0 0  Down, Depressed, Hopeless 0 0 0 0 0  PHQ - 2 Score 0 0 0 0 0  Altered sleeping - 0 0 0 -  Tired, decreased energy - 0 0 0 -  Change in appetite - 0 1 0 -  Feeling bad or failure about yourself  - 0 0 0 -  Trouble concentrating - 0 0 0 -  Moving slowly or fidgety/restless - 0 0 0 -  Suicidal thoughts - 0 0 0 -  PHQ-9 Score - 0 1 0 -  Difficult doing work/chores - Not difficult at all Not difficult at all Not difficult at all -     Fall Risk: Fall Risk  07/30/2018 05/14/2018 04/13/2018 03/04/2018 12/29/2017  Falls in the past year? No No No No No  Risk for fall due to : - Impaired vision;Other (Comment) - - -  Risk for fall due to: Comment - wears eyeglasses; tinnitus - - -     Functional Status Survey: Is the patient deaf or have difficulty hearing?: No Does the patient have difficulty seeing, even when wearing glasses/contacts?: Yes(glasses) Does the patient have difficulty concentrating, remembering, or making decisions?: No Does the patient have difficulty walking or climbing stairs?: No Does the patient have difficulty dressing or bathing?: No Does the patient have difficulty doing errands alone such as visiting a doctor's office or shopping?: No   Assessment & Plan  1. Well woman exam  She refuses all immunizations  2. Need for immunization against influenza  Refused  3.  Hypothyroidism (acquired)  - TSH  4. Hyperglycemia  - Hemoglobin A1c  5. Ovarian failure  - DG Bone Density; Future  -USPSTF grade A and B recommendations reviewed with patient; age-appropriate recommendations, preventive care, screening tests, etc discussed and encouraged; healthy living encouraged; see AVS for patient education given to patient -Discussed importance of 150 minutes of physical activity weekly, eat two servings of fish weekly, eat one serving of tree nuts ( cashews, pistachios, pecans, almonds.Marland Kitchen) every other day, eat 6 servings of fruit/vegetables daily and drink plenty of water and avoid sweet beverages.

## 2018-07-30 NOTE — Patient Instructions (Signed)

## 2018-08-02 ENCOUNTER — Other Ambulatory Visit: Payer: Self-pay

## 2018-08-02 ENCOUNTER — Other Ambulatory Visit (HOSPITAL_COMMUNITY)
Admission: RE | Admit: 2018-08-02 | Discharge: 2018-08-02 | Disposition: A | Discharge status: Home / Self Care | Payer: Medicare HMO | Source: Ambulatory Visit | Attending: Family Medicine | Admitting: Family Medicine

## 2018-08-02 DIAGNOSIS — Z124 Encounter for screening for malignant neoplasm of cervix: Secondary | ICD-10-CM

## 2018-08-02 DIAGNOSIS — Z01419 Encounter for gynecological examination (general) (routine) without abnormal findings: Secondary | ICD-10-CM | POA: Diagnosis not present

## 2018-08-04 ENCOUNTER — Encounter: Payer: Self-pay | Admitting: Family Medicine

## 2018-08-04 DIAGNOSIS — R87612 Low grade squamous intraepithelial lesion on cytologic smear of cervix (LGSIL): Secondary | ICD-10-CM | POA: Insufficient documentation

## 2018-08-04 LAB — CYTOLOGY - PAP

## 2018-08-05 ENCOUNTER — Other Ambulatory Visit: Payer: Self-pay | Admitting: Family Medicine

## 2018-08-05 DIAGNOSIS — R87612 Low grade squamous intraepithelial lesion on cytologic smear of cervix (LGSIL): Secondary | ICD-10-CM

## 2018-08-11 ENCOUNTER — Other Ambulatory Visit: Payer: Self-pay | Admitting: Family Medicine

## 2018-08-11 DIAGNOSIS — R87612 Low grade squamous intraepithelial lesion on cytologic smear of cervix (LGSIL): Secondary | ICD-10-CM

## 2018-08-12 ENCOUNTER — Telehealth: Payer: Self-pay | Admitting: Obstetrics & Gynecology

## 2018-08-12 NOTE — Telephone Encounter (Signed)
[facility] referring for Low grade squamous intraepith lesion on cytologic smear cervix (lgsil). Called and left voicemail for patient to call back to be schedule

## 2018-08-13 ENCOUNTER — Other Ambulatory Visit: Payer: Self-pay | Admitting: Family Medicine

## 2018-08-13 DIAGNOSIS — K219 Gastro-esophageal reflux disease without esophagitis: Secondary | ICD-10-CM

## 2018-08-18 ENCOUNTER — Encounter: Payer: Self-pay | Admitting: Obstetrics & Gynecology

## 2018-08-18 ENCOUNTER — Other Ambulatory Visit (HOSPITAL_COMMUNITY)
Admission: RE | Admit: 2018-08-18 | Discharge: 2018-08-18 | Disposition: A | Discharge status: Home / Self Care | Payer: Medicare HMO | Source: Ambulatory Visit | Attending: Obstetrics & Gynecology | Admitting: Obstetrics & Gynecology

## 2018-08-18 ENCOUNTER — Ambulatory Visit (INDEPENDENT_AMBULATORY_CARE_PROVIDER_SITE_OTHER): Payer: Medicare HMO | Admitting: Obstetrics & Gynecology

## 2018-08-18 VITALS — BP 120/80 | Ht 61.0 in | Wt 135.0 lb

## 2018-08-18 DIAGNOSIS — R87622 Low grade squamous intraepithelial lesion on cytologic smear of vagina (LGSIL): Secondary | ICD-10-CM | POA: Insufficient documentation

## 2018-08-18 DIAGNOSIS — N890 Mild vaginal dysplasia: Secondary | ICD-10-CM | POA: Diagnosis not present

## 2018-08-18 NOTE — Addendum Note (Signed)
Addended by: Nadara Mustard on: 08/18/2018 04:47 PM   Modules accepted: Orders

## 2018-08-18 NOTE — Progress Notes (Signed)
Referring Provider:  Dr Carlynn Purl  HPI:  Maureen [name] is a 72 y.o.   who presents today for evaluation and management of abnormal vaginal cytology.  Prior TAH years ago for fibroids.  Dysplasia History:  LGSIL of vag PAP  ROS:  Pertinent items are noted in HPI.  OB History  No data available   Past Medical History:  Diagnosis Date  . Allergy   . Corn of toe   . First degree AV block   . GERD (gastroesophageal reflux disease)   . Hammer toe of right foot   . Hyperglycemia   . Hyperlipidemia   . Hypertension   . Loss of appetite   . Prolapse of urethra   . Scoliosis (and kyphoscoliosis), idiopathic   . Thyroid disease   . Tinnitus of both ears   . Vitreous floaters of right eye     Past Surgical History:  Procedure Laterality Date  . ABDOMINAL HYSTERECTOMY  1989  . APPENDECTOMY  1989  . BREAST EXCISIONAL BIOPSY Bilateral    multiple biopsies negative  . BREAST SURGERY     Several  . TUMOR EXCISION  1989   same time as hysterectomy    SOCIAL HISTORY: Social History   Substance and Sexual Activity  Alcohol Use Never  . Alcohol/week: 0.0 standard drinks  . Frequency: Never   Social History   Substance and Sexual Activity  Drug Use No   Family History  Problem Relation Age of Onset  . Diabetes Father 6       DM complications  . Hypertension Mother 59       CKD  . Thyroid disease Mother   . Heart failure Mother   . Kidney disease Mother   . Breast cancer Sister 66  . Kidney disease Unknown   . Breast cancer Maternal Aunt   . Breast cancer Cousin   . Anuerysm Son 51  . Diabetes Son    ALLERGIES:  Aspirin; Citalopram; Codeine; and Penicillins  Current Outpatient Medications on File Prior to Visit  Medication Sig Dispense Refill  . b complex vitamins capsule Take 1 capsule by mouth daily. 30 capsule 0  . conjugated estrogens (PREMARIN) vaginal cream Place 1 Applicatorful vaginally daily. 42.5 g 12  . fluticasone (FLONASE) 50 MCG/ACT nasal spray  Place 1 spray into both nostrils daily. 16 g 2  . Garlic 1000 MG CAPS Take by mouth.    . levothyroxine (SYNTHROID, LEVOTHROID) 88 MCG tablet Take 1 tablet (88 mcg total) by mouth daily before breakfast. 90 tablet 0  . Multiple Vitamins-Minerals (AIRBORNE PO) Take 1 each by mouth daily.    . ranitidine (ZANTAC) 300 MG tablet TAKE 1 TABLET BY MOUTH EVERYDAY AT BEDTIME 90 tablet 1  . triamcinolone cream (KENALOG) 0.1 % Apply 1 application topically 2 (two) times daily. 45 g 0   No current facility-administered medications on file prior to visit.    Physical Exam: -Vitals:  BP 120/80   Ht 5\' 1"  (1.549 m)   Wt 135 lb (61.2 kg)   BMI 25.51 kg/m  GEN: WD, WN, NAD.  A+ O x 3, good mood and affect. ABD:  NT, ND.  Soft, no masses.  No hernias noted.   Pelvic:   Vulva: Normal appearance.  No lesions.  Vagina: No lesions or abnormalities noted.  Support: Normal pelvic support.  Urethra No masses tenderness or scarring.  Meatus Normal size without lesions or prolapse.  Cervix: See below.  Anus: Normal exam.  No lesions.  Perineum: Normal exam.  No lesions.        Bimanual   Uterus: Normal size.  Non-tender.  Mobile.  AV.  Adnexae: No masses.  Non-tender to palpation.  Cul-de-sac: Negative for abnormality.   PROCEDURE: 1.  Urine Pregnancy Test:  not done 2.  Colposcopy performed with 4% acetic acid after verbal consent obtained                                       -Aceto-white Lesions Location(s): none. Vagina has a speckled atrophic appearance.              -Biopsy performed at apex of vagina in area of speckled atrophic tissue.               -ECC indicated and performed: No.     -Biopsy sites made hemostatic with pressure, AgNO3, and/or Monsel's solution   -Satisfactory colposcopy: Yes.      -Evidence of Invasive cervical CA :  NO  ASSESSMENT:  Maureen [name] is a 72 y.o. here for  1. LGSIL Pap smear of vagina    PLAN: 1.  I discussed the grading system of pap smears and HPV  high risk viral types.  We will discuss and base management after colpo results return. 2. Follow up PAP 6 months, vs intervention if high grade dysplasia identified     Annamarie Major, MD, [facility], [facility] 08/18/2018  11:25 AM

## 2018-08-18 NOTE — Patient Instructions (Signed)

## 2018-08-26 ENCOUNTER — Telehealth: Payer: Self-pay | Admitting: Obstetrics & Gynecology

## 2018-08-26 NOTE — Telephone Encounter (Signed)
Patient is calling for labs results. Please advise. 

## 2018-08-26 NOTE — Telephone Encounter (Signed)
Needs appt in April 2020 for follow up

## 2018-08-27 ENCOUNTER — Encounter (INDEPENDENT_AMBULATORY_CARE_PROVIDER_SITE_OTHER): Payer: Medicare HMO

## 2018-08-27 ENCOUNTER — Encounter

## 2018-08-27 ENCOUNTER — Ambulatory Visit (INDEPENDENT_AMBULATORY_CARE_PROVIDER_SITE_OTHER): Payer: Medicare HMO | Admitting: Vascular Surgery

## 2018-08-30 NOTE — Telephone Encounter (Signed)
Appointment recall added to remind patient to schedule Follow up with [facility]

## 2018-09-17 ENCOUNTER — Other Ambulatory Visit: Payer: Self-pay | Admitting: Family Medicine

## 2018-09-17 DIAGNOSIS — E039 Hypothyroidism, unspecified: Secondary | ICD-10-CM

## 2018-09-30 ENCOUNTER — Encounter: Payer: Self-pay | Admitting: Nurse Practitioner

## 2018-09-30 ENCOUNTER — Ambulatory Visit (INDEPENDENT_AMBULATORY_CARE_PROVIDER_SITE_OTHER): Payer: Medicare HMO | Admitting: Nurse Practitioner

## 2018-09-30 VITALS — BP 136/88 | HR 93 | Temp 98.3°F | Resp 16 | Ht 62.0 in | Wt 136.5 lb

## 2018-09-30 DIAGNOSIS — R05 Cough: Secondary | ICD-10-CM

## 2018-09-30 DIAGNOSIS — R059 Cough, unspecified: Secondary | ICD-10-CM

## 2018-09-30 DIAGNOSIS — J3489 Other specified disorders of nose and nasal sinuses: Secondary | ICD-10-CM | POA: Diagnosis not present

## 2018-09-30 DIAGNOSIS — E039 Hypothyroidism, unspecified: Secondary | ICD-10-CM

## 2018-09-30 DIAGNOSIS — J069 Acute upper respiratory infection, unspecified: Secondary | ICD-10-CM

## 2018-09-30 DIAGNOSIS — J301 Allergic rhinitis due to pollen: Secondary | ICD-10-CM

## 2018-09-30 DIAGNOSIS — R739 Hyperglycemia, unspecified: Secondary | ICD-10-CM | POA: Diagnosis not present

## 2018-09-30 MED ORDER — LORATADINE 5 MG PO TBDP: 1.0000 | ORAL_TABLET | Freq: Every day | ORAL | 0 refills | Status: DC

## 2018-09-30 MED ORDER — BENZONATATE 100 MG PO CAPS: 100.0000 mg | ORAL_CAPSULE | Freq: Two times a day (BID) | ORAL | 0 refills | Status: DC | PRN

## 2018-09-30 MED ORDER — FLUTICASONE PROPIONATE 50 MCG/ACT NA SUSP: 1.0000 | Freq: Every day | NASAL | 2 refills | Status: DC

## 2018-09-30 MED ORDER — LEVOTHYROXINE SODIUM 88 MCG PO TABS: 88.0000 ug | ORAL_TABLET | Freq: Every day | ORAL | 0 refills | Status: DC

## 2018-09-30 NOTE — Patient Instructions (Addendum)
-   Restart the flonase - Take tessalon perls as needed for cough ( up to twice a day) - Take loratadine 5 mg to help dry out your nose - Drink lots of water - Drink honey lemon teas to soothe throat - If you are not continuing to feel better or at any point it gets signficantly worse please start the antibiotics.    You likely have a viral upper respiratory infection (URI). Antibiotics will not reduce the number of days you are ill or prevent you from getting bacterial rhinosinusitis. A URI can take up to 14 days to resolve, but typically last between 7-11 days. Your body is so smart and strong that it will be fighting this illness off for you but it is important that you drink plenty of fluids, rest. Cover your nose/mouth when you cough or sneeze and wash your hands well and often. Here are some helpful things you can use or pick up over the counter from the pharmacy to help with your symptoms:   For Fever/Pain: Acetaminophen every 6 hours as needed (maximum of 3000mg  a day). If you are still uncomfortable you can add ibuprofen OR naproxen  For coughing: try dextromethorphan for a cough suppressant, and/or a cool mist humidifier, lozenges  For sore throat: saline gargles, honey herbal tea, lozenges, throat spray  To dry out your nose: try an antihistamine like loratadine (non-sedating) or diphenhydramine (sedating) or others To relieve a stuffy nose: try flonase daily  To make blowing your nose easier: guaifenesin

## 2018-09-30 NOTE — Progress Notes (Signed)
Name: Maureen [name]   MRN: [id number]    DOB: [birth year]   Date:09/30/2018       Progress Note  Subjective  Chief Complaint  Chief Complaint  Patient presents with  . URI    patient presents with cough for 6 days. she has had a sore throat, sneezing and congestion    HPI  Patient presents with 6 days of rhinorrhea, sneezing, dry cough. Notes yellow snot last week- states has decreased. Symptoms have continued to improve the past 2 days.  Has not taken her flonase for awhile- forgot about it. Has been drinking lemon teas and using Robitussin.    Good appetite, not affecting sleep. No chest pain, fevers, shortness of breath.   Patient Active Problem List   Diagnosis Date Noted  . LGSIL Pap smear of vagina 08/18/2018  . Low grade squamous intraepith lesion on cytologic smear cervix (lgsil) 08/04/2018  . Chronic venous insufficiency 07/05/2018  . Lymphedema 07/05/2018  . Hypertension 04/02/2018  . Swelling of limb 04/02/2018  . Depression, major, recurrent, mild (HCC) 12/29/2017  . Leukopenia 05/24/2017  . Allergic rhinitis, seasonal 06/29/2015  . Clavus 06/29/2015  . 1st degree AV block 06/29/2015  . Hammer toe 06/29/2015  . H/O: HTN (hypertension) 06/29/2015  . Blood glucose elevated 06/29/2015  . Floater, vitreous 06/29/2015  . Bilateral tinnitus 06/29/2015  . Hypothyroidism (acquired) 04/02/2015  . Gastroesophageal reflux disease 04/02/2015  . Hammer toe of right foot 04/02/2015  . Scoliosis of thoracic spine 04/02/2015  . Urethral prolapse 04/02/2015  . Corn of toe 04/02/2015  . Hyperlipidemia 04/02/2015  . Varicose veins of both lower extremities 04/02/2015    Past Medical History:  Diagnosis Date  . Allergy   . Corn of toe   . First degree AV block   . GERD (gastroesophageal reflux disease)   . Hammer toe of right foot   . Hyperglycemia   . Hyperlipidemia   . Hypertension   . Loss of appetite   . Prolapse of urethra   . Scoliosis (and kyphoscoliosis),  idiopathic   . Thyroid disease   . Tinnitus of both ears   . Vitreous floaters of right eye     Past Surgical History:  Procedure Laterality Date  . ABDOMINAL HYSTERECTOMY  1989  . APPENDECTOMY  1989  . BREAST EXCISIONAL BIOPSY Bilateral    multiple biopsies negative  . BREAST SURGERY     Several  . TUMOR EXCISION  1989   same time as hysterectomy    Social History   Tobacco Use  . Smoking status: Never Smoker  . Smokeless tobacco: Never Used  . Tobacco comment: smoking cessation materials not required  Substance Use Topics  . Alcohol use: Never    Alcohol/week: 0.0 standard drinks    Frequency: Never     Current Outpatient Medications:  .  b complex vitamins capsule, Take 1 capsule by mouth daily., Disp: 30 capsule, Rfl: 0 .  benzonatate (TESSALON) 100 MG capsule, Take 1 capsule (100 mg total) by mouth 2 (two) times daily as needed for cough., Disp: 20 capsule, Rfl: 0 .  conjugated estrogens (PREMARIN) vaginal cream, Place 1 Applicatorful vaginally daily., Disp: 42.5 g, Rfl: 12 .  fluticasone (FLONASE) 50 MCG/ACT nasal spray, Place 1 spray into both nostrils daily., Disp: 16 g, Rfl: 2 .  Garlic 1000 MG CAPS, Take by mouth., Disp: , Rfl:  .  levothyroxine (SYNTHROID, LEVOTHROID) 88 MCG tablet, Take 1 tablet (88 mcg total) by mouth daily  before breakfast., Disp: 5 tablet, Rfl: 0 .  Loratadine 5 MG TBDP, Take 1 tablet (5 mg total) by mouth daily., Disp: 30 tablet, Rfl: 0 .  Multiple Vitamins-Minerals (AIRBORNE PO), Take 1 each by mouth daily., Disp: , Rfl:  .  ranitidine (ZANTAC) 300 MG tablet, TAKE 1 TABLET BY MOUTH EVERYDAY AT BEDTIME, Disp: 90 tablet, Rfl: 1 .  triamcinolone cream (KENALOG) 0.1 %, Apply 1 application topically 2 (two) times daily., Disp: 45 g, Rfl: 0  Allergies  Allergen Reactions  . Aspirin     Tinnitus  . Citalopram Other (See Comments)  . Codeine Nausea And Vomiting  . Penicillins Rash    Has patient had a PCN reaction causing immediate rash,  facial/tongue/throat swelling, SOB or lightheadedness with hypotension: Yes Has patient had a PCN reaction causing severe rash involving mucus membranes or skin necrosis: No Has patient had a PCN reaction that required hospitalization No Has patient had a PCN reaction occurring within the last 10 years: No If all of the above answers are "NO", then may proceed with Cephalosporin use.    ROS   No other specific complaints in a complete review of systems (except as listed in HPI above).  Objective  Vitals:   09/30/18 0922  BP: 136/88  Pulse: 93  Resp: 16  Temp: 98.3 F (36.8 C)  TempSrc: Oral  SpO2: 97%  Weight: 136 lb 8 oz (61.9 kg)  Height: 5\' 2"  (1.575 m)    Body mass index is 24.97 kg/m.  Nursing Note and Vital Signs reviewed.  Physical Exam  HENT:  Head: Normocephalic and atraumatic.  Right Ear: External ear and ear canal normal. Right ear middle ear effusion: moderate dried wax obstucting full view- TM noninjected.  Left Ear: External ear and ear canal normal. A middle ear effusion is present.  Nose: Nose normal. Right sinus exhibits no maxillary sinus tenderness and no frontal sinus tenderness. Left sinus exhibits no maxillary sinus tenderness and no frontal sinus tenderness.  Mouth/Throat: Oropharynx is clear and moist. No oropharyngeal exudate.  Eyes: Conjunctivae are normal. Right eye exhibits no discharge. Left eye exhibits no discharge.  Neck: Normal range of motion.  Cardiovascular: Normal rate and regular rhythm.  Pulmonary/Chest: Effort normal and breath sounds normal.  Abdominal: Soft. There is no tenderness.  Lymphadenopathy:    She has no cervical adenopathy.  Neurological: She is alert.  Skin: Skin is warm and dry. No rash noted.  Psychiatric: Judgment normal.      No results found for this or any previous visit (from the past 48 hour(s)).  Assessment & Plan  1. Upper respiratory tract infection, unspecified type Discussed course and signs  and symptoms of complications- when to call in and when to go to ER. Patient is much improved from earlier course- discussed likely continued improvement with viral URI - Loratadine 5 MG TBDP; Take 1 tablet (5 mg total) by mouth daily.  Dispense: 30 tablet; Refill: 0  2. Cough - benzonatate (TESSALON) 100 MG capsule; Take 1 capsule (100 mg total) by mouth 2 (two) times daily as needed for cough.  Dispense: 20 capsule; Refill: 0  3. Rhinorrhea - Loratadine 5 MG TBDP; Take 1 tablet (5 mg total) by mouth daily.  Dispense: 30 tablet; Refill: 0  4. Seasonal allergic rhinitis due to pollen - fluticasone (FLONASE) 50 MCG/ACT nasal spray; Place 1 spray into both nostrils daily.  Dispense: 16 g; Refill: 2  5. Hypothyroidism (acquired) Rechecking today will send 30 day  supply with refills per patient preference pending TSH results she is getting drawn today  - levothyroxine (SYNTHROID, LEVOTHROID) 88 MCG tablet; Take 1 tablet (88 mcg total) by mouth daily before breakfast.  Dispense: 5 tablet; Refill: 0

## 2018-10-01 LAB — TSH: TSH: 4.55 mIU/L — ABNORMAL HIGH (ref 0.40–4.50)

## 2018-10-01 LAB — HEMOGLOBIN A1C
Hgb A1c MFr Bld: 6 % of total Hgb — ABNORMAL HIGH (ref <5.7)
Mean Plasma Glucose: 126 (calc)
eAG (mmol/L): 7 (calc)

## 2018-10-04 ENCOUNTER — Telehealth: Payer: Self-pay | Admitting: Family Medicine

## 2018-10-04 NOTE — Telephone Encounter (Signed)
Copied from CRM [id number]. Topic: Quick Communication - See Telephone Encounter >> Oct 04, 2018  3:39 PM Angela NevinWilliams, Candice N wrote: CRM for notification. See Telephone encounter for: 10/04/18.  Patient calling to check status of lab results from 12/5. Please advise.

## 2018-10-04 NOTE — Telephone Encounter (Signed)
Patient was informed.

## 2018-10-05 NOTE — Telephone Encounter (Signed)
Tried calling patient, we can either change dose now or repeat level on same dose in 6 weeks. She takes generic and there is a 20 % variation on each batch of medication.

## 2018-10-05 NOTE — Telephone Encounter (Signed)
Please advise 

## 2018-10-05 NOTE — Telephone Encounter (Signed)
Patient calling back to inquire after receiving lab results if she should continue taking levothyroxine (SYNTHROID, LEVOTHROID) 88 MCG tablet or if she needs to have more labs done before requesting refill. Patient is requesting call back. Please advise.

## 2018-10-06 ENCOUNTER — Other Ambulatory Visit: Payer: Self-pay

## 2018-10-06 DIAGNOSIS — E039 Hypothyroidism, unspecified: Secondary | ICD-10-CM

## 2018-10-06 MED ORDER — LEVOTHYROXINE SODIUM 88 MCG PO TABS: 88.0000 ug | ORAL_TABLET | Freq: Every day | ORAL | 0 refills | Status: DC

## 2018-10-06 NOTE — Telephone Encounter (Signed)
Copied from CRM [id number]. Topic: General - Other >> Oct 05, 2018  5:50 PM Marylen PontoMcneil, Ja-Kwan wrote: Reason for CRM: Patient returned call to office. Patient stated she would like to repeat same dose.

## 2018-10-25 ENCOUNTER — Ambulatory Visit: Payer: Self-pay

## 2018-10-25 ENCOUNTER — Ambulatory Visit: Payer: Self-pay | Admitting: *Deleted

## 2018-10-25 MED ORDER — FAMOTIDINE 40 MG PO TABS: 40.0000 mg | ORAL_TABLET | Freq: Every day | ORAL | 0 refills | Status: DC

## 2018-10-25 NOTE — Addendum Note (Signed)
Addended by: Doren CustardBOYCE, EMILY E on: 10/25/2018 03:04 PM   Modules accepted: Orders

## 2018-10-25 NOTE — Telephone Encounter (Signed)
Pt called to ask the dose of pepcid she was to take and how often she was to take the medication. NT answered question. Pt verbalized understanding.  Reason for Disposition . Caller has medication question only, adult not sick, and triager answers question  Answer Assessment - Initial Assessment Questions 1. SYMPTOMS: "Do you have any symptoms?"     no 2. SEVERITY: If symptoms are present, ask "Are they mild, moderate or severe?"     Need to know how to take Pepcid. New medication sent in today.  Protocols used: MEDICATION QUESTION CALL-A-AH

## 2018-10-25 NOTE — Telephone Encounter (Signed)
It depends on the manufacturer - I would have her as the pharmacy if her zantac is safe to continue.  If not, I will send in pepcid to replace.

## 2018-10-25 NOTE — Telephone Encounter (Signed)
Medication has been completely taken off the self. Please send in alternate.

## 2018-10-25 NOTE — Telephone Encounter (Signed)
Pt [facility] that ranitidine (ZANTAC) 300 MG tablet was "taken off the shelf". She wants to make sure that it is ok to take what she has left. She wants to make sure that it is safe to take  Please call   Patient wants to know if she needs to stop her generic Zantac. Please advise her if it is safe or if she should change to something else.  Reason for Disposition . Caller has NON-URGENT medication question about med that PCP prescribed and triager unable to answer question  Protocols used: MEDICATION QUESTION CALL-A-AH

## 2018-11-01 ENCOUNTER — Other Ambulatory Visit: Payer: Self-pay | Admitting: Family Medicine

## 2018-11-01 DIAGNOSIS — Z1231 Encounter for screening mammogram for malignant neoplasm of breast: Secondary | ICD-10-CM

## 2018-11-29 ENCOUNTER — Other Ambulatory Visit: Payer: Self-pay | Admitting: Family Medicine

## 2018-11-29 ENCOUNTER — Telehealth: Payer: Self-pay

## 2018-11-29 ENCOUNTER — Other Ambulatory Visit: Payer: Self-pay

## 2018-11-29 DIAGNOSIS — E039 Hypothyroidism, unspecified: Secondary | ICD-10-CM

## 2018-11-29 NOTE — Telephone Encounter (Signed)
Copied from CRM [id number]. Topic: General - Other >> Nov 29, 2018  8:58 AM Arlyss Gandy, NT wrote: Reason for CRM: Pt wanted to check and see if she was to come back 6 weeks after her last visit to have her TSH levels checked? Please advise.

## 2018-11-29 NOTE — Telephone Encounter (Signed)
Patient notified and will try to come in soon to have her blood level recheck for TSH. She has to find a driver since she no longer drives.

## 2018-11-29 NOTE — Telephone Encounter (Signed)
Yes.  Order placed, please release

## 2018-12-10 ENCOUNTER — Ambulatory Visit
Admission: RE | Admit: 2018-12-10 | Discharge: 2018-12-10 | Disposition: A | Discharge status: Home / Self Care | Payer: Medicare HMO | Source: Ambulatory Visit | Attending: Family Medicine | Admitting: Family Medicine

## 2018-12-10 DIAGNOSIS — Z1231 Encounter for screening mammogram for malignant neoplasm of breast: Secondary | ICD-10-CM | POA: Insufficient documentation

## 2019-01-04 ENCOUNTER — Ambulatory Visit (INDEPENDENT_AMBULATORY_CARE_PROVIDER_SITE_OTHER): Payer: Medicare HMO | Admitting: Vascular Surgery

## 2019-01-05 ENCOUNTER — Other Ambulatory Visit: Payer: Self-pay | Admitting: Family Medicine

## 2019-01-05 ENCOUNTER — Ambulatory Visit (INDEPENDENT_AMBULATORY_CARE_PROVIDER_SITE_OTHER): Payer: Medicare HMO | Admitting: Vascular Surgery

## 2019-01-05 DIAGNOSIS — E039 Hypothyroidism, unspecified: Secondary | ICD-10-CM

## 2019-01-05 MED ORDER — LEVOTHYROXINE SODIUM 88 MCG PO TABS: 88.0000 ug | ORAL_TABLET | Freq: Every day | ORAL | 0 refills | Status: DC

## 2019-01-05 NOTE — Telephone Encounter (Signed)
Refill request for thyroid medication. Levothyroxine   Last Physical: 07/30/2018   Lab Results  Component Value Date   TSH 4.55 (H) 09/30/2018     Follow up on 01/11/2019

## 2019-01-05 NOTE — Telephone Encounter (Signed)
Copied from CRM [id number]. Topic: Quick Communication - Rx Refill/Question >> Jan 05, 2019  3:30 PM Arlyss Gandy, NT wrote: Medication: levothyroxine (SYNTHROID, LEVOTHROID) 88 MCG tablet   Pt states that she will be out of medication tomorrow and would like to see if more can be sent in to last until that appt.   Has the patient contacted their pharmacy? Yes.   (Agent: If no, request that the patient contact the pharmacy for the refill.) (Agent: If yes, when and what did the pharmacy advise?)  Preferred Pharmacy (with phone number or street name):   [facility] [phone] [place], [place] - [address] [phone] (Phone) [phone] (Fax)    Agent: Please be advised that RX refills may take up to 3 business days. We ask that you follow-up with your pharmacy.

## 2019-01-09 ENCOUNTER — Other Ambulatory Visit: Payer: Self-pay | Admitting: Family Medicine

## 2019-01-09 DIAGNOSIS — K219 Gastro-esophageal reflux disease without esophagitis: Secondary | ICD-10-CM

## 2019-01-11 ENCOUNTER — Other Ambulatory Visit: Payer: Self-pay

## 2019-01-11 ENCOUNTER — Ambulatory Visit (INDEPENDENT_AMBULATORY_CARE_PROVIDER_SITE_OTHER): Payer: Medicare HMO | Admitting: Family Medicine

## 2019-01-11 ENCOUNTER — Encounter: Payer: Self-pay | Admitting: Family Medicine

## 2019-01-11 VITALS — BP 122/76 | HR 90 | Temp 97.9°F | Resp 16 | Ht 62.0 in | Wt 133.1 lb

## 2019-01-11 DIAGNOSIS — E782 Mixed hyperlipidemia: Secondary | ICD-10-CM

## 2019-01-11 DIAGNOSIS — L308 Other specified dermatitis: Secondary | ICD-10-CM

## 2019-01-11 DIAGNOSIS — R87612 Low grade squamous intraepithelial lesion on cytologic smear of cervix (LGSIL): Secondary | ICD-10-CM | POA: Diagnosis not present

## 2019-01-11 DIAGNOSIS — F325 Major depressive disorder, single episode, in full remission: Secondary | ICD-10-CM | POA: Diagnosis not present

## 2019-01-11 DIAGNOSIS — K219 Gastro-esophageal reflux disease without esophagitis: Secondary | ICD-10-CM

## 2019-01-11 DIAGNOSIS — E039 Hypothyroidism, unspecified: Secondary | ICD-10-CM | POA: Diagnosis not present

## 2019-01-11 DIAGNOSIS — R7303 Prediabetes: Secondary | ICD-10-CM

## 2019-01-11 DIAGNOSIS — N952 Postmenopausal atrophic vaginitis: Secondary | ICD-10-CM | POA: Diagnosis not present

## 2019-01-11 LAB — TSH: TSH: 1.44 mIU/L (ref 0.40–4.50)

## 2019-01-11 MED ORDER — FAMOTIDINE 40 MG PO TABS: 40.0000 mg | ORAL_TABLET | Freq: Every day | ORAL | 0 refills | Status: DC

## 2019-01-11 MED ORDER — TRIAMCINOLONE ACETONIDE 0.1 % EX CREA: 1.0000 "application " | TOPICAL_CREAM | Freq: Two times a day (BID) | CUTANEOUS | 0 refills | Status: DC

## 2019-01-11 MED ORDER — ESTROGENS, CONJUGATED 0.625 MG/GM VA CREA: 1.0000 | TOPICAL_CREAM | Freq: Every day | VAGINAL | 12 refills | Status: DC

## 2019-01-11 NOTE — Progress Notes (Signed)
Name: Maureen [name]   MRN: [id number]    DOB: 1946-06-30   Date:01/11/2019       Progress Note  Subjective  Chief Complaint  Chief Complaint  Patient presents with  . Follow-up    5 month F/U  . Hypothyroidism  . Gastroesophageal Reflux  . Allergic Rhinitis   . Depression in remission  . Hyperlipidemia    HPI    Hypothyroidism: she is taking levothyroxine 88 mcg daily , no hair loss, dry skin, change in bowel movements. We will recheck levels today, last level was slightly up  GERD: she states taking Pepic  at most once daily . She denies heartburn or indigestion. She is avoiding spicy food and tomato paste on her meals, she does like fried food.Stable   Pre-diabetes: she has changed her diet, last hgbA1C was 6.0%  she denies polyphagia, polydipsia or polyuria. We will check yearly   Hyperlipidemia: on life style modification only, cannot tolerate statin therapy , unable to take aspirin because it caused tinnitus in the past. Unchanged   Varicose veins: seen by Dr. Wyn Quaker, advised to wear compression stocking hoses and likely venous but may have some lymphedema. Edema is stable, she only wears panty hoses, she very seldom wears the support hoses   Depression in remission: she has significant family history mother and sister that had to be hospitalized. Personally she had been depressed on and off all her life, severe symptoms a few years ago when she lost a lot of weight , her weight was down 119 lbs , she was given multiple medications in the past including Remeron, she has been in remission for about one year now and we will continue to monitor.    LGIL: seen by Dr. Tiburcio Pea and has follow up in April for repeat pap smear  Patient Active Problem List   Diagnosis Date Noted  . LGSIL Pap smear of vagina 08/18/2018  . Low grade squamous intraepith lesion on cytologic smear cervix (lgsil) 08/04/2018  . Chronic venous insufficiency 07/05/2018  . Lymphedema 07/05/2018  .  Hypertension 04/02/2018  . Swelling of limb 04/02/2018  . Depression, major, recurrent, mild (HCC) 12/29/2017  . Leukopenia 05/24/2017  . Allergic rhinitis, seasonal 06/29/2015  . Clavus 06/29/2015  . 1st degree AV block 06/29/2015  . Hammer toe 06/29/2015  . H/O: HTN (hypertension) 06/29/2015  . Blood glucose elevated 06/29/2015  . Floater, vitreous 06/29/2015  . Bilateral tinnitus 06/29/2015  . Hypothyroidism (acquired) 04/02/2015  . Gastroesophageal reflux disease 04/02/2015  . Hammer toe of right foot 04/02/2015  . Scoliosis of thoracic spine 04/02/2015  . Urethral prolapse 04/02/2015  . Corn of toe 04/02/2015  . Hyperlipidemia 04/02/2015  . Varicose veins of both lower extremities 04/02/2015    Past Surgical History:  Procedure Laterality Date  . ABDOMINAL HYSTERECTOMY  1989  . APPENDECTOMY  1989  . BREAST EXCISIONAL BIOPSY Bilateral    multiple biopsies negative  . BREAST SURGERY     Several  . TUMOR EXCISION  1989   same time as hysterectomy    Family History  Problem Relation Age of Onset  . Diabetes Father 64       DM complications  . Hypertension Mother 46       CKD  . Thyroid disease Mother   . Heart failure Mother   . Kidney disease Mother   . Breast cancer Sister 60  . Kidney disease Other   . Breast cancer Maternal Aunt   . Breast  cancer Cousin   . Anuerysm Son 51  . Diabetes Son     Social History   Socioeconomic History  . Marital status: Widowed    Spouse name: Not on file  . Number of children: 3  . Years of education: 2912  . Highest education level: 12th grade  Occupational History  . Occupation: Retired  Engineer, productionocial Needs  . Financial resource strain: Not hard at all  . Food insecurity:    Worry: Never true    Inability: Never true  . Transportation needs:    Medical: No    Non-medical: No  Tobacco Use  . Smoking status: Never Smoker  . Smokeless tobacco: Never Used  . Tobacco comment: smoking cessation materials not required   Substance and Sexual Activity  . Alcohol use: Never    Alcohol/week: 0.0 standard drinks    Frequency: Never  . Drug use: No  . Sexual activity: Not Currently    Partners: Male  Lifestyle  . Physical activity:    Days per week: 5 days    Minutes per session: 30 min  . Stress: Not at all  Relationships  . Social connections:    Talks on phone: More than three times a week    Gets together: More than three times a week    Attends religious service: More than 4 times per year    Active member of club or organization: Yes    Attends meetings of clubs or organizations: More than 4 times per year    Relationship status: Widowed  . Intimate partner violence:    Fear of current or ex partner: No    Emotionally abused: No    Physically abused: No    Forced sexual activity: No  Other Topics Concern  . Not on file  Social History Narrative  . Not on file     Current Outpatient Medications:  .  Ascorbic Acid (VITAMIN C) 1000 MG tablet, Take 1,000 mg by mouth daily., Disp: , Rfl:  .  famotidine (PEPCID) 40 MG tablet, Take 1 tablet (40 mg total) by mouth daily., Disp: 90 tablet, Rfl: 0 .  Garlic 1000 MG CAPS, Take by mouth., Disp: , Rfl:  .  levothyroxine (SYNTHROID, LEVOTHROID) 88 MCG tablet, Take 1 tablet (88 mcg total) by mouth daily before breakfast., Disp: 30 tablet, Rfl: 0 .  Multiple Vitamins-Minerals (AIRBORNE PO), Take 1 each by mouth daily., Disp: , Rfl:  .  triamcinolone cream (KENALOG) 0.1 %, Apply 1 application topically 2 (two) times daily., Disp: 45 g, Rfl: 0 .  TURMERIC CURCUMIN PO, Take 550 mg by mouth daily., Disp: , Rfl:  .  b complex vitamins capsule, Take 1 capsule by mouth daily. (Patient not taking: Reported on 01/11/2019), Disp: 30 capsule, Rfl: 0 .  benzonatate (TESSALON) 100 MG capsule, Take 1 capsule (100 mg total) by mouth 2 (two) times daily as needed for cough. (Patient not taking: Reported on 01/11/2019), Disp: 20 capsule, Rfl: 0 .  conjugated estrogens  (PREMARIN) vaginal cream, Place 1 Applicatorful vaginally daily. (Patient not taking: Reported on 01/11/2019), Disp: 42.5 g, Rfl: 12 .  fluticasone (FLONASE) 50 MCG/ACT nasal spray, Place 1 spray into both nostrils daily., Disp: 16 g, Rfl: 2 .  Loratadine 5 MG TBDP, Take 1 tablet (5 mg total) by mouth daily. (Patient not taking: Reported on 01/11/2019), Disp: 30 tablet, Rfl: 0  Allergies  Allergen Reactions  . Aspirin     Tinnitus  . Citalopram Other (See Comments)  .  Codeine Nausea And Vomiting  . Penicillins Rash    Has patient had a PCN reaction causing immediate rash, facial/tongue/throat swelling, SOB or lightheadedness with hypotension: Yes Has patient had a PCN reaction causing severe rash involving mucus membranes or skin necrosis: No Has patient had a PCN reaction that required hospitalization No Has patient had a PCN reaction occurring within the last 10 years: No If all of the above answers are "NO", then may proceed with Cephalosporin use.    I personally reviewed active problem list, medication list, allergies, family history, social history with the patient/caregiver today.   ROS  Constitutional: Negative for fever , positive for mild weight change.  Respiratory: Negative for cough and shortness of breath.   Cardiovascular: Negative for chest pain or palpitations.  Gastrointestinal: Negative for abdominal pain, no bowel changes.  Musculoskeletal: Negative for gait problem or joint swelling.  Skin: Negative for rash.  Neurological: Negative for dizziness or headache.  No other specific complaints in a complete review of systems (except as listed in HPI above).   Objective  Vitals:   01/11/19 1112  BP: 122/76  Pulse: 90  Resp: 16  Temp: 97.9 F (36.6 C)  TempSrc: Oral  SpO2: 98%  Weight: 133 lb 1.6 oz (60.4 kg)  Height: 5\' 2"  (1.575 m)    Body mass index is 24.34 kg/m.  Physical Exam  Constitutional: Patient appears well-developed and well-nourished.  No  distress.  HEENT: head atraumatic, normocephalic, pupils equal and reactive to light,neck supple, throat within normal limits, no thyromegaly  Cardiovascular: Normal rate, regular rhythm and normal heart sounds.  No murmur heard. No BLE edema. Pulmonary/Chest: Effort normal and breath sounds normal. No respiratory distress. Abdominal: Soft.  There is no tenderness. Muscular skeletal: scoliosis  Psychiatric: Patient has a normal mood and affect. behavior is normal. Judgment and thought content normal.  PHQ2/9: Depression screen [facility] 2/9 09/30/2018 07/30/2018 05/14/2018 04/13/2018 03/04/2018  Decreased Interest 0 0 0 0 0  Down, Depressed, Hopeless 0 0 0 0 0  PHQ - 2 Score 0 0 0 0 0  Altered sleeping 0 - 0 0 0  Tired, decreased energy 0 - 0 0 0  Change in appetite 0 - 0 1 0  Feeling bad or failure about yourself  0 - 0 0 0  Trouble concentrating 0 - 0 0 0  Moving slowly or fidgety/restless 0 - 0 0 0  Suicidal thoughts 0 - 0 0 0  PHQ-9 Score 0 - 0 1 0  Difficult doing work/chores Not difficult at all - Not difficult at all Not difficult at all Not difficult at all     Fall Risk: Fall Risk  01/11/2019 09/30/2018 07/30/2018 05/14/2018 04/13/2018  Falls in the past year? 0 0 No No No  Number falls in past yr: 0 0 - - -  Injury with Fall? 0 0 - - -  Risk for fall due to : - - - Impaired vision;Other (Comment) -  Risk for fall due to: Comment - - - wears eyeglasses; tinnitus -     Functional Status Survey: Is the patient deaf or have difficulty hearing?: No Does the patient have difficulty seeing, even when wearing glasses/contacts?: Yes Does the patient have difficulty concentrating, remembering, or making decisions?: No Does the patient have difficulty walking or climbing stairs?: No Does the patient have difficulty dressing or bathing?: No Does the patient have difficulty doing errands alone such as visiting a doctor's office or shopping?: No  Assessment & Plan  1. Major depression in  remission [facility])  Doing well at this time, she had lost a lot of weight in the past and took medication but doing well   2. Other eczema  - triamcinolone cream (KENALOG) 0.1 %; Apply 1 application topically 2 (two) times daily.  Dispense: 45 g; Refill: 0  3. Vaginal atrophy  - conjugated estrogens (PREMARIN) vaginal cream; Place 1 Applicatorful vaginally daily.  Dispense: 42.5 g; Refill: 12  4. Hypothyroidism (acquired)  She had labs done today   5. Low grade squamous intraepith lesion on cytologic smear cervix (lgsil)  Keep follow up with Dr. Tiburcio Pea   6. Gastroesophageal reflux disease without esophagitis  Taking medication prn   7. Mixed hyperlipidemia  Not on medication  8. Pre-diabetes  Last hgbA1C 6%

## 2019-01-13 ENCOUNTER — Ambulatory Visit (INDEPENDENT_AMBULATORY_CARE_PROVIDER_SITE_OTHER): Payer: Medicare HMO | Admitting: Nurse Practitioner

## 2019-01-18 ENCOUNTER — Ambulatory Visit (INDEPENDENT_AMBULATORY_CARE_PROVIDER_SITE_OTHER): Payer: Medicare HMO | Admitting: Nurse Practitioner

## 2019-01-31 ENCOUNTER — Other Ambulatory Visit: Payer: Self-pay | Admitting: Family Medicine

## 2019-01-31 DIAGNOSIS — E039 Hypothyroidism, unspecified: Secondary | ICD-10-CM

## 2019-01-31 NOTE — Telephone Encounter (Signed)
Refill request for thyroid medication. Levothyroxine to [facility]   Last visit: 01/11/2019   Lab Results  Component Value Date   TSH 1.44 01/11/2019     Follow up on 05/20/2019

## 2019-04-15 ENCOUNTER — Other Ambulatory Visit: Payer: Self-pay | Admitting: Family Medicine

## 2019-04-15 DIAGNOSIS — J301 Allergic rhinitis due to pollen: Secondary | ICD-10-CM

## 2019-04-15 DIAGNOSIS — E039 Hypothyroidism, unspecified: Secondary | ICD-10-CM

## 2019-05-03 ENCOUNTER — Ambulatory Visit: Payer: Medicare HMO | Admitting: Obstetrics & Gynecology

## 2019-05-18 ENCOUNTER — Ambulatory Visit: Payer: Medicare HMO | Admitting: Obstetrics & Gynecology

## 2019-05-20 ENCOUNTER — Ambulatory Visit: Payer: Medicare HMO

## 2019-05-25 ENCOUNTER — Telehealth: Payer: Self-pay | Admitting: Family Medicine

## 2019-05-25 NOTE — Telephone Encounter (Signed)
Pt called and stated that she would like a different acid reflux medication sent in because [facility] is out of it and does not know when it will be in. Pt states that she does not know medication and that provider will know.

## 2019-05-26 NOTE — Telephone Encounter (Signed)
Patient was unavailable left a message to have her return our call regarding her medication and if she is willing to switch to generic Pepcid.

## 2019-06-13 ENCOUNTER — Telehealth: Payer: Self-pay | Admitting: Family Medicine

## 2019-06-13 ENCOUNTER — Other Ambulatory Visit: Payer: Self-pay | Admitting: Family Medicine

## 2019-06-13 DIAGNOSIS — R739 Hyperglycemia, unspecified: Secondary | ICD-10-CM

## 2019-06-13 DIAGNOSIS — E782 Mixed hyperlipidemia: Secondary | ICD-10-CM

## 2019-06-13 DIAGNOSIS — Z79899 Other long term (current) drug therapy: Secondary | ICD-10-CM

## 2019-06-13 DIAGNOSIS — E039 Hypothyroidism, unspecified: Secondary | ICD-10-CM

## 2019-06-13 DIAGNOSIS — D708 Other neutropenia: Secondary | ICD-10-CM

## 2019-06-13 NOTE — Telephone Encounter (Signed)
°  Pt want to know do she need to have labs done before she get her levothyroxine (SYNTHROID) 88 MCG tablet refilled and does she need to fast. Please advise

## 2019-06-14 NOTE — Telephone Encounter (Signed)
Patient called. Patient aware. She will schedule a virtual appointment.

## 2019-06-17 DIAGNOSIS — Z79899 Other long term (current) drug therapy: Secondary | ICD-10-CM | POA: Diagnosis not present

## 2019-06-17 DIAGNOSIS — R739 Hyperglycemia, unspecified: Secondary | ICD-10-CM | POA: Diagnosis not present

## 2019-06-17 DIAGNOSIS — E039 Hypothyroidism, unspecified: Secondary | ICD-10-CM | POA: Diagnosis not present

## 2019-06-17 DIAGNOSIS — D708 Other neutropenia: Secondary | ICD-10-CM | POA: Diagnosis not present

## 2019-06-17 DIAGNOSIS — E782 Mixed hyperlipidemia: Secondary | ICD-10-CM | POA: Diagnosis not present

## 2019-06-18 LAB — CBC WITH DIFFERENTIAL/PLATELET
Absolute Monocytes: 435 cells/uL (ref 200–950)
Basophils Absolute: 61 cells/uL (ref 0–200)
Basophils Relative: 1.8 %
Eosinophils Absolute: 122 cells/uL (ref 15–500)
Eosinophils Relative: 3.6 %
HCT: 36.4 % (ref 35.0–45.0)
Hemoglobin: 11.9 g/dL (ref 11.7–15.5)
Lymphs Abs: 1816 cells/uL (ref 850–3900)
MCH: 29.5 pg (ref 27.0–33.0)
MCHC: 32.7 g/dL (ref 32.0–36.0)
MCV: 90.1 fL (ref 80.0–100.0)
MPV: 11.6 fL (ref 7.5–12.5)
Monocytes Relative: 12.8 %
Neutro Abs: 966 cells/uL — ABNORMAL LOW (ref 1500–7800)
Neutrophils Relative %: 28.4 %
Platelets: 182 10*3/uL (ref 140–400)
RBC: 4.04 10*6/uL (ref 3.80–5.10)
RDW: 13.5 % (ref 11.0–15.0)
Total Lymphocyte: 53.4 %
WBC: 3.4 10*3/uL — ABNORMAL LOW (ref 3.8–10.8)

## 2019-06-18 LAB — LIPID PANEL
Cholesterol: 179 mg/dL (ref <200)
HDL: 45 mg/dL — ABNORMAL LOW (ref >=50)
LDL Cholesterol (Calc): 118 mg/dL (calc) — ABNORMAL HIGH
Non-HDL Cholesterol (Calc): 134 mg/dL (calc) — ABNORMAL HIGH (ref <130)
Total CHOL/HDL Ratio: 4 (calc) (ref <5.0)
Triglycerides: 64 mg/dL (ref <150)

## 2019-06-18 LAB — COMPLETE METABOLIC PANEL WITH GFR
AG Ratio: 1.4 (calc) (ref 1.0–2.5)
ALT: 16 U/L (ref 6–29)
AST: 24 U/L (ref 10–35)
Albumin: 4.1 g/dL (ref 3.6–5.1)
Alkaline phosphatase (APISO): 66 U/L (ref 37–153)
BUN: 12 mg/dL (ref 7–25)
CO2: 31 mmol/L (ref 20–32)
Calcium: 9.2 mg/dL (ref 8.6–10.4)
Chloride: 104 mmol/L (ref 98–110)
Creat: 0.69 mg/dL (ref 0.60–0.93)
GFR, Est African American: 100 mL/min/{1.73_m2} (ref >=60)
GFR, Est Non African American: 86 mL/min/{1.73_m2} (ref >=60)
Globulin: 2.9 g/dL (calc) (ref 1.9–3.7)
Glucose, Bld: 85 mg/dL (ref 65–99)
Potassium: 4.8 mmol/L (ref 3.5–5.3)
Sodium: 142 mmol/L (ref 135–146)
Total Bilirubin: 0.5 mg/dL (ref 0.2–1.2)
Total Protein: 7 g/dL (ref 6.1–8.1)

## 2019-06-18 LAB — TSH: TSH: 0.59 mIU/L (ref 0.40–4.50)

## 2019-06-18 LAB — HEMOGLOBIN A1C
Hgb A1c MFr Bld: 5.9 % of total Hgb — ABNORMAL HIGH (ref <5.7)
Mean Plasma Glucose: 123 (calc)
eAG (mmol/L): 6.8 (calc)

## 2019-06-27 ENCOUNTER — Encounter: Payer: Self-pay | Admitting: Family Medicine

## 2019-06-27 ENCOUNTER — Ambulatory Visit (INDEPENDENT_AMBULATORY_CARE_PROVIDER_SITE_OTHER): Payer: Medicare HMO | Admitting: Family Medicine

## 2019-06-27 ENCOUNTER — Other Ambulatory Visit: Payer: Self-pay

## 2019-06-27 DIAGNOSIS — F4321 Adjustment disorder with depressed mood: Secondary | ICD-10-CM

## 2019-06-27 DIAGNOSIS — F325 Major depressive disorder, single episode, in full remission: Secondary | ICD-10-CM

## 2019-06-27 DIAGNOSIS — E782 Mixed hyperlipidemia: Secondary | ICD-10-CM

## 2019-06-27 DIAGNOSIS — R739 Hyperglycemia, unspecified: Secondary | ICD-10-CM | POA: Diagnosis not present

## 2019-06-27 DIAGNOSIS — E039 Hypothyroidism, unspecified: Secondary | ICD-10-CM

## 2019-06-27 DIAGNOSIS — K219 Gastro-esophageal reflux disease without esophagitis: Secondary | ICD-10-CM

## 2019-06-27 DIAGNOSIS — D708 Other neutropenia: Secondary | ICD-10-CM

## 2019-06-27 MED ORDER — LEVOTHYROXINE SODIUM 88 MCG PO TABS: 88.0000 ug | ORAL_TABLET | Freq: Every day | ORAL | 0 refills | Status: DC

## 2019-06-27 NOTE — Progress Notes (Signed)
Name: Maureen [name]   MRN: [id number]    DOB: [birth year]   Date:06/27/2019       Progress Note  Subjective  Chief Complaint  Chief Complaint  Patient presents with  . Medication Refill    Wants to discuss the WBC results  . Depression  . Gastroesophageal Reflux  . Allergic Rhinitis   . Hyperlipidemia  . Hypothyroidism    I connected with  [name] on 06/27/19 at  2:20 PM EDT by telephone and verified that I am speaking with the correct person using two identifiers.  I discussed the limitations, risks, security and privacy concerns of performing an evaluation and management service by telephone and the availability of in person appointments. Staff also discussed with the patient that there may be a patient responsible charge related to this service. Patient Location: at home Provider Location: at [facility]   HPI  Hypothyroidism: she is taking levothyroxine 88 mcg daily , no hair loss, dry skin, change in bowel movements. Last TSH was at goal  Leucopenia: discussed recent labs, no symptoms, we will recheck level on her next visit   GERD: she has been out of pepcid secondary to [place] shortage, she will try getting pepcid AC otc . She denies heartburn or indigestion. Advised to take tums or otc medication prn only   Pre-diabetes: she has changed her diet, last hgbA1Cwas down from 6.0% to 5.9% she denies polyphagia, polydipsia or polyuria.   Hyperlipidemia: on life style modification only, cannot tolerate statin therapy , unable to take aspirin because it caused tinnitus in the past. Unchanged. Last LDL improved  Varicose veins: seen by Dr. Wyn Quaker, advised to wear compression stocking hoses and likely venous but may have some lymphedema. Edema has improved she only wears panty hoses, she very seldom wears the support hoses   Depression in remission: she has significant family history mother and sister that had to be hospitalized. Personally she  had been depressed on and off all her life, severe symptoms a few years ago when she lost a lot of weight , her weight was down 119 lbs , she was given multiple medications in the past including Remeron. Phq 9 is positive agai, but she lost a friend and also a nephew by homicide about 6 weeks ago and is grieving    LGIL: seen by Dr. Tiburcio Pea , he missed follow up in April but she will re-schedule it   Patient Active Problem List   Diagnosis Date Noted  . Low grade squamous intraepith lesion on cytologic smear cervix (lgsil) 08/04/2018  . Chronic venous insufficiency 07/05/2018  . Lymphedema 07/05/2018  . Hypertension 04/02/2018  . Swelling of limb 04/02/2018  . Depression, major, recurrent, mild (HCC) 12/29/2017  . Leukopenia 05/24/2017  . Allergic rhinitis, seasonal 06/29/2015  . Clavus 06/29/2015  . 1st degree AV block 06/29/2015  . Hammer toe 06/29/2015  . H/O: HTN (hypertension) 06/29/2015  . Blood glucose elevated 06/29/2015  . Floater, vitreous 06/29/2015  . Bilateral tinnitus 06/29/2015  . Hypothyroidism (acquired) 04/02/2015  . Gastroesophageal reflux disease 04/02/2015  . Hammer toe of right foot 04/02/2015  . Scoliosis of thoracic spine 04/02/2015  . Urethral prolapse 04/02/2015  . Corn of toe 04/02/2015  . Hyperlipidemia 04/02/2015  . Varicose veins of both lower extremities 04/02/2015    Past Surgical History:  Procedure Laterality Date  . ABDOMINAL HYSTERECTOMY  1989  . APPENDECTOMY  1989  . BREAST EXCISIONAL BIOPSY Bilateral  multiple biopsies negative  . BREAST SURGERY     Several  . TUMOR EXCISION  1989   same time as hysterectomy    Family History  Problem Relation Age of Onset  . Diabetes Father 976       DM complications  . Hypertension Mother 4479       CKD  . Thyroid disease Mother   . Heart failure Mother   . Kidney disease Mother   . Breast cancer Sister 4053  . Kidney disease Other   . Breast cancer Maternal Aunt   . Breast cancer Cousin   .  Anuerysm Son 51  . Diabetes Son     Social History   Socioeconomic History  . Marital status: Widowed    Spouse name: Not on file  . Number of children: 3  . Years of education: 6612  . Highest education level: 12th grade  Occupational History  . Occupation: Retired  Engineer, productionocial Needs  . Financial resource strain: Not hard at all  . Food insecurity    Worry: Never true    Inability: Never true  . Transportation needs    Medical: No    Non-medical: No  Tobacco Use  . Smoking status: Never Smoker  . Smokeless tobacco: Never Used  . Tobacco comment: smoking cessation materials not required  Substance and Sexual Activity  . Alcohol use: Never    Alcohol/week: 0.0 standard drinks    Frequency: Never  . Drug use: No  . Sexual activity: Not Currently    Partners: Male  Lifestyle  . Physical activity    Days per week: 5 days    Minutes per session: 30 min  . Stress: Not at all  Relationships  . Social connections    Talks on phone: More than three times a week    Gets together: More than three times a week    Attends religious service: More than 4 times per year    Active member of club or organization: Yes    Attends meetings of clubs or organizations: More than 4 times per year    Relationship status: Widowed  . Intimate partner violence    Fear of current or ex partner: No    Emotionally abused: No    Physically abused: No    Forced sexual activity: No  Other Topics Concern  . Not on file  Social History Narrative  . Not on file     Current Outpatient Medications:  .  Ascorbic Acid (VITAMIN C) 1000 MG tablet, Take 1,000 mg by mouth daily., Disp: , Rfl:  .  conjugated estrogens (PREMARIN) vaginal cream, Place 1 Applicatorful vaginally daily., Disp: 42.5 g, Rfl: 12 .  famotidine (PEPCID) 40 MG tablet, Take 1 tablet (40 mg total) by mouth daily., Disp: 90 tablet, Rfl: 0 .  fluticasone (FLONASE) 50 MCG/ACT nasal spray, USE 1 SPRAY IN EACH NOSTRIL ONCE DAILY, Disp: 16 g,  Rfl: 0 .  Garlic 1000 MG CAPS, Take by mouth., Disp: , Rfl:  .  levothyroxine (SYNTHROID) 88 MCG tablet, TAKE 1 TABLET BY MOUTH ONCE DAILY BEFORE  BREAKFAST. NEED LABS FOR MORE REFILLS, Disp: 90 tablet, Rfl: 0 .  Multiple Vitamins-Minerals (AIRBORNE PO), Take 1 each by mouth daily., Disp: , Rfl:  .  triamcinolone cream (KENALOG) 0.1 %, Apply 1 application topically 2 (two) times daily., Disp: 45 g, Rfl: 0 .  TURMERIC CURCUMIN PO, Take 550 mg by mouth daily., Disp: , Rfl:   Allergies  Allergen  Reactions  . Aspirin     Tinnitus  . Citalopram Other (See Comments)  . Codeine Nausea And Vomiting  . Penicillins Rash    Has patient had a PCN reaction causing immediate rash, facial/tongue/throat swelling, SOB or lightheadedness with hypotension: Yes Has patient had a PCN reaction causing severe rash involving mucus membranes or skin necrosis: No Has patient had a PCN reaction that required hospitalization No Has patient had a PCN reaction occurring within the last 10 years: No If all of the above answers are "NO", then may proceed with Cephalosporin use.    I personally reviewed active problem list, medication list, allergies, family history, social history with the patient/caregiver today.   ROS  Ten systems reviewed and is negative except as mentioned in HPI   Objective  Virtual encounter There is no height or weight on file to calculate BMI.   Physical Exam  Awake, alert and oriented  PHQ2/9: Depression screen [facility] 2/9 06/27/2019 01/11/2019 09/30/2018 07/30/2018 05/14/2018  Decreased Interest 0 0 0 0 0  Down, Depressed, Hopeless 2 0 0 0 0  PHQ - 2 Score 2 0 0 0 0  Altered sleeping 1 1 0 - 0  Tired, decreased energy 0 0 0 - 0  Change in appetite 0 0 0 - 0  Feeling bad or failure about yourself  0 0 0 - 0  Trouble concentrating 0 0 0 - 0  Moving slowly or fidgety/restless 0 0 0 - 0  Suicidal thoughts 0 0 0 - 0  PHQ-9 Score 3 1 0 - 0  Difficult doing work/chores Somewhat  difficult Not difficult at all Not difficult at all - Not difficult at all  Some recent data might be hidden   PHQ-2/9 Result is positive - she states her nephew was killed 07/16 and is upset about it.  She also lost a close friend on July 10 th   Fall Risk: Fall Risk  06/27/2019 01/11/2019 09/30/2018 07/30/2018 05/14/2018  Falls in the past year? 0 0 0 No No  Number falls in past yr: 0 0 0 - -  Injury with Fall? 0 0 0 - -  Risk for fall due to : - - - - Impaired vision;Other (Comment)  Risk for fall due to: Comment - - - - wears eyeglasses; tinnitus     Assessment & Plan  1. Grieving   2. Hypothyroidism (acquired)  - levothyroxine (SYNTHROID) 88 MCG tablet; Take 1 tablet (88 mcg total) by mouth daily before breakfast.  Dispense: 90 tablet; Refill: 0  3. Mixed hyperlipidemia   4. Hyperglycemia  Slight improve of A1C   5. Major depression in remission ([place])  Grieving now   6. Gastroesophageal reflux disease without esophagitis  Take prn medication   7. Other neutropenia ([place])  We will recheck during her next office visit, she likes coming every 3 months   I discussed the assessment and treatment plan with the patient. The patient was provided an opportunity to ask questions and all were answered. The patient agreed with the plan and demonstrated an understanding of the instructions.   The patient was advised to call back or seek an in-person evaluation if the symptoms worsen or if the condition fails to improve as anticipated.  I provided 25 minutes of non-face-to-face time during this encounter.  Loistine Chance, MD

## 2019-07-06 ENCOUNTER — Telehealth: Payer: Self-pay | Admitting: Family Medicine

## 2019-07-06 ENCOUNTER — Telehealth: Payer: Self-pay

## 2019-07-06 NOTE — Telephone Encounter (Signed)
Copied from [place] [phone]. Topic: General - Other >> Jul 05, 2019  1:06 PM Leward Quan A wrote: Reason for CRM: Patient request a call back from Dr Ancil Boozer nurse states that she was informed if she have a reoccurrence of what she was seen for on 06/27/2019 to call. Per patient she is having the same problem she did not elaborate but want the nurse to call at Ph# [phone] >> Jul 06, 2019  1:11 PM Erick Blinks wrote: Pt called requesting urgent call back [phone] Cell [phone]   Patient called and she states that on Tuesday she had some light blood in her stool. It was light and not dark. She said that she had an episode before and you told her if it happens again to let you know. She does not want to have a colonoscopy, but is wanting to do the stool cards. She feels fine.

## 2019-07-06 NOTE — Chronic Care Management (AMB) (Signed)
Chronic Care Management   Note  07/06/2019 Name: Maureen [name] MRN: [id number] DOB: 27-Apr-1946  Maureen [name] is a 73 y.o. year old female who is a primary care patient of Steele Sizer, MD. I reached out to [employer] by phone today in response to a referral sent by [name]'s health plan.    [name] was given information about Chronic Care Management services today including:  1. CCM service includes personalized support from designated clinical staff supervised by her physician, including individualized plan of care and coordination with other care providers 2. 24/7 contact phone numbers for assistance for urgent and routine care needs. 3. Service will only be billed when office clinical staff spend 20 minutes or more in a month to coordinate care. 4. Only one practitioner may furnish and bill the service in a calendar month. 5. The patient may stop CCM services at any time (effective at the end of the month) by phone call to the office staff. 6. The patient will be responsible for cost sharing (co-pay) of up to 20% of the service fee (after annual deductible is met).  Patient did not agree to enrollment in care management services and does not wish to consider at this time.  Follow up plan: The patient has been provided with contact information for the chronic care management team and has been advised to call with any health related questions or concerns.   [place]  [email or web address]   ??[phone]

## 2019-07-11 ENCOUNTER — Other Ambulatory Visit: Payer: Self-pay | Admitting: Family Medicine

## 2019-07-11 DIAGNOSIS — K625 Hemorrhage of anus and rectum: Secondary | ICD-10-CM

## 2019-07-11 NOTE — Telephone Encounter (Signed)
She will see GI. Referral needed.

## 2019-07-14 ENCOUNTER — Ambulatory Visit (INDEPENDENT_AMBULATORY_CARE_PROVIDER_SITE_OTHER): Payer: Medicare HMO | Admitting: Gastroenterology

## 2019-07-14 ENCOUNTER — Encounter (INDEPENDENT_AMBULATORY_CARE_PROVIDER_SITE_OTHER): Payer: Self-pay

## 2019-07-14 ENCOUNTER — Other Ambulatory Visit: Payer: Self-pay

## 2019-07-14 ENCOUNTER — Encounter: Payer: Self-pay | Admitting: Gastroenterology

## 2019-07-14 VITALS — BP 153/96 | HR 99 | Temp 98.7°F | Ht 62.0 in | Wt 129.6 lb

## 2019-07-14 DIAGNOSIS — K625 Hemorrhage of anus and rectum: Secondary | ICD-10-CM

## 2019-07-14 MED ORDER — NA SULFATE-K SULFATE-MG SULF 17.5-3.13-1.6 GM/177ML PO SOLN: 1.0000 | Freq: Once | ORAL | 0 refills | Status: AC

## 2019-07-14 NOTE — Progress Notes (Signed)
Maureen [name] Maureen Wombles, MD [address]  [address]  [place], [place] [address]  Main: [phone]  Fax: [phone]    Gastroenterology Consultation  Referring Provider:     Alba CorySowles, Krichna, MD Primary Care Physician:  Alba CorySowles, Krichna, MD Primary Gastroenterologist:  Dr. Arlyss Repressohini [name] Maureen [name] Reason for Consultation:     Rectal bleeding        HPI:   Maureen [name] is a 73 y.o. female referred by Dr. Alba CorySowles, Krichna, MD  for consultation & management of rectal bleeding.  Patient reports that for the last 1 month, she has been noticing intermittent bright red blood per rectum, on wiping and sometimes mixed with stool which is light-colored.  Her bowel movements are generally regular.  She denies any rectal discomfort.  Patient is taking oral iron 1 pill daily.  She denies abdominal pain, weight loss, bloating, change in stool caliber.  NSAIDs: None  Antiplts/Anticoagulants/Anti thrombotics: None  GI Procedures: Colonoscopy in 2012 by Dr. Evette CristalSankar, normal  Past Medical History:  Diagnosis Date  . Allergy   . Corn of toe   . First degree AV block   . GERD (gastroesophageal reflux disease)   . Hammer toe of right foot   . Hyperglycemia   . Hyperlipidemia   . Hypertension   . Loss of appetite   . Prolapse of urethra   . Scoliosis (and kyphoscoliosis), idiopathic   . Thyroid disease   . Tinnitus of both ears   . Vitreous floaters of right eye     Past Surgical History:  Procedure Laterality Date  . ABDOMINAL HYSTERECTOMY  1989  . APPENDECTOMY  1989  . BREAST EXCISIONAL BIOPSY Bilateral    multiple biopsies negative  . BREAST SURGERY     Several  . TUMOR EXCISION  1989   same time as hysterectomy    Current Outpatient Medications:  .  Ascorbic Acid (VITAMIN C) 1000 MG tablet, Take 1,000 mg by mouth daily., Disp: , Rfl:  .  famotidine (PEPCID) 40 MG tablet, Take 1 tablet (40 mg total) by mouth daily., Disp: 90 tablet, Rfl: 0 .  fluticasone (FLONASE) 50 MCG/ACT nasal  spray, USE 1 SPRAY IN EACH NOSTRIL ONCE DAILY, Disp: 16 g, Rfl: 0 .  Garlic 1000 MG CAPS, Take by mouth., Disp: , Rfl:  .  levothyroxine (SYNTHROID) 88 MCG tablet, Take 1 tablet (88 mcg total) by mouth daily before breakfast., Disp: 90 tablet, Rfl: 0 .  Multiple Vitamins-Minerals (AIRBORNE PO), Take 1 each by mouth daily., Disp: , Rfl:  .  conjugated estrogens (PREMARIN) vaginal cream, Place 1 Applicatorful vaginally daily. (Patient not taking: Reported on 07/14/2019), Disp: 42.5 g, Rfl: 12 .  triamcinolone cream (KENALOG) 0.1 %, Apply 1 application topically 2 (two) times daily. (Patient not taking: Reported on 07/14/2019), Disp: 45 g, Rfl: 0 .  TURMERIC CURCUMIN PO, Take 550 mg by mouth daily., Disp: , Rfl:    Family History  Problem Relation Age of Onset  . Diabetes Father 5776       DM complications  . Hypertension Mother 2779       CKD  . Thyroid disease Mother   . Heart failure Mother   . Kidney disease Mother   . Breast cancer Sister 1753  . Kidney disease Other   . Breast cancer Maternal Aunt   . Breast cancer Cousin   . Anuerysm Son 51  . Diabetes Son      Social History   Tobacco Use  .  Smoking status: Never Smoker  . Smokeless tobacco: Never Used  . Tobacco comment: smoking cessation materials not required  Substance Use Topics  . Alcohol use: Never    Alcohol/week: 0.0 standard drinks    Frequency: Never  . Drug use: No    Allergies as of 07/14/2019 - Review Complete 07/14/2019  Allergen Reaction Noted  . Aspirin  06/26/2015  . Citalopram Other (See Comments) 06/26/2015  . Codeine Nausea And Vomiting 04/02/2015  . Penicillins Rash 04/02/2015    Review of Systems:    All systems reviewed and negative except where noted in HPI.   Physical Exam:  BP (!) 153/96   Pulse 99   Temp 98.7 F (37.1 C) (Oral)   Ht 5\' 2"  (1.575 m)   Wt 129 lb 9.6 oz (58.8 kg)   BMI 23.70 kg/m  No LMP recorded. Patient has had a hysterectomy.  General:   Alert,  Well-developed,  well-nourished, pleasant and cooperative in NAD Head:  Normocephalic and atraumatic. Eyes:  Sclera clear, no icterus.   Conjunctiva pink. Ears:  Normal auditory acuity. Nose:  No deformity, discharge, or lesions. Mouth:  No deformity or lesions,oropharynx pink & moist. Neck:  Supple; no masses or thyromegaly. Lungs:  Respirations even and unlabored.  Clear throughout to auscultation.   No wheezes, crackles, or rhonchi. No acute distress. Heart:  Regular rate and rhythm; no murmurs, clicks, rubs, or gallops. Abdomen:  Normal bowel sounds. Soft, non-tender and non-distended without masses, hepatosplenomegaly or hernias noted.  No guarding or rebound tenderness.   Rectal: Not performed Msk:  Symmetrical without gross deformities. Good, equal movement & strength bilaterally. Pulses:  Normal pulses noted. Extremities:  No clubbing or edema.  No cyanosis. Neurologic:  Alert and oriented x3;  grossly normal neurologically. Skin:  Intact without significant lesions or rashes. No jaundice. Psych:  Alert and cooperative. Normal mood and affect.  Imaging Studies: No recent abdominal imaging  Assessment and Plan:   [name] is a 73 y.o. female with history of hypothyroidism seen in consultation for painless rectal bleeding for last 1 month.  Given her age and last colonoscopy in 2012, I recommend diagnostic colonoscopy to evaluate for any colorectal malignancy or large polyps.  Patient is agreeable and she prefers to have the colonoscopy in last week of September 2020  I have discussed alternative options, risks & benefits,  which include, but are not limited to, bleeding, infection, perforation,respiratory complication & drug reaction.  The patient agrees with this plan & written consent will be obtained.     Follow up based on the colonoscopy results   Cephas Darby, MD

## 2019-07-25 ENCOUNTER — Other Ambulatory Visit: Payer: Self-pay

## 2019-07-25 ENCOUNTER — Other Ambulatory Visit
Admission: RE | Admit: 2019-07-25 | Discharge: 2019-07-25 | Disposition: A | Discharge status: Home / Self Care | Payer: Medicare HMO | Source: Ambulatory Visit | Attending: Gastroenterology | Admitting: Gastroenterology

## 2019-07-25 DIAGNOSIS — Z20828 Contact with and (suspected) exposure to other viral communicable diseases: Secondary | ICD-10-CM | POA: Diagnosis not present

## 2019-07-25 DIAGNOSIS — K648 Other hemorrhoids: Secondary | ICD-10-CM | POA: Insufficient documentation

## 2019-07-25 DIAGNOSIS — Z01812 Encounter for preprocedural laboratory examination: Secondary | ICD-10-CM | POA: Insufficient documentation

## 2019-07-26 LAB — SARS CORONAVIRUS 2 (TAT 6-24 HRS): SARS Coronavirus 2: NEGATIVE

## 2019-07-28 ENCOUNTER — Ambulatory Visit: Payer: Medicare HMO | Admitting: Anesthesiology

## 2019-07-28 ENCOUNTER — Other Ambulatory Visit: Payer: Self-pay

## 2019-07-28 ENCOUNTER — Encounter
Admission: RE | Disposition: A | Discharge status: Home / Self Care | Payer: Self-pay | Source: Home / Self Care | Attending: Gastroenterology

## 2019-07-28 ENCOUNTER — Ambulatory Visit
Admission: RE | Admit: 2019-07-28 | Discharge: 2019-07-28 | Disposition: A | Discharge status: Home / Self Care | Payer: Medicare HMO | Attending: Gastroenterology | Admitting: Gastroenterology

## 2019-07-28 DIAGNOSIS — Z8349 Family history of other endocrine, nutritional and metabolic diseases: Secondary | ICD-10-CM | POA: Diagnosis not present

## 2019-07-28 DIAGNOSIS — Z90710 Acquired absence of both cervix and uterus: Secondary | ICD-10-CM | POA: Insufficient documentation

## 2019-07-28 DIAGNOSIS — R739 Hyperglycemia, unspecified: Secondary | ICD-10-CM | POA: Diagnosis not present

## 2019-07-28 DIAGNOSIS — E039 Hypothyroidism, unspecified: Secondary | ICD-10-CM | POA: Diagnosis not present

## 2019-07-28 DIAGNOSIS — Z841 Family history of disorders of kidney and ureter: Secondary | ICD-10-CM | POA: Insufficient documentation

## 2019-07-28 DIAGNOSIS — Z803 Family history of malignant neoplasm of breast: Secondary | ICD-10-CM | POA: Diagnosis not present

## 2019-07-28 DIAGNOSIS — E785 Hyperlipidemia, unspecified: Secondary | ICD-10-CM | POA: Insufficient documentation

## 2019-07-28 DIAGNOSIS — Z886 Allergy status to analgesic agent status: Secondary | ICD-10-CM | POA: Diagnosis not present

## 2019-07-28 DIAGNOSIS — Z8249 Family history of ischemic heart disease and other diseases of the circulatory system: Secondary | ICD-10-CM | POA: Diagnosis not present

## 2019-07-28 DIAGNOSIS — Z833 Family history of diabetes mellitus: Secondary | ICD-10-CM | POA: Diagnosis not present

## 2019-07-28 DIAGNOSIS — I440 Atrioventricular block, first degree: Secondary | ICD-10-CM | POA: Diagnosis not present

## 2019-07-28 DIAGNOSIS — Z888 Allergy status to other drugs, medicaments and biological substances status: Secondary | ICD-10-CM | POA: Diagnosis not present

## 2019-07-28 DIAGNOSIS — H9313 Tinnitus, bilateral: Secondary | ICD-10-CM | POA: Diagnosis not present

## 2019-07-28 DIAGNOSIS — Z880 Allergy status to penicillin: Secondary | ICD-10-CM | POA: Insufficient documentation

## 2019-07-28 DIAGNOSIS — Z885 Allergy status to narcotic agent status: Secondary | ICD-10-CM | POA: Insufficient documentation

## 2019-07-28 DIAGNOSIS — F329 Major depressive disorder, single episode, unspecified: Secondary | ICD-10-CM | POA: Diagnosis not present

## 2019-07-28 DIAGNOSIS — I10 Essential (primary) hypertension: Secondary | ICD-10-CM | POA: Insufficient documentation

## 2019-07-28 DIAGNOSIS — K648 Other hemorrhoids: Secondary | ICD-10-CM | POA: Insufficient documentation

## 2019-07-28 DIAGNOSIS — Z79899 Other long term (current) drug therapy: Secondary | ICD-10-CM | POA: Insufficient documentation

## 2019-07-28 DIAGNOSIS — M419 Scoliosis, unspecified: Secondary | ICD-10-CM | POA: Diagnosis not present

## 2019-07-28 DIAGNOSIS — K219 Gastro-esophageal reflux disease without esophagitis: Secondary | ICD-10-CM | POA: Insufficient documentation

## 2019-07-28 DIAGNOSIS — K625 Hemorrhage of anus and rectum: Secondary | ICD-10-CM

## 2019-07-28 DIAGNOSIS — K644 Residual hemorrhoidal skin tags: Secondary | ICD-10-CM | POA: Diagnosis not present

## 2019-07-28 HISTORY — PX: COLONOSCOPY WITH PROPOFOL: SHX5780

## 2019-07-28 SURGERY — COLONOSCOPY WITH PROPOFOL
Anesthesia: General

## 2019-07-28 MED ORDER — PROPOFOL 500 MG/50ML IV EMUL
INTRAVENOUS | Status: AC
  Filled 2019-07-28: qty 50

## 2019-07-28 MED ORDER — PROPOFOL 10 MG/ML IV BOLUS
INTRAVENOUS | Status: DC | PRN
  Administered 2019-07-28: 40 mg via INTRAVENOUS

## 2019-07-28 MED ORDER — LIDOCAINE HCL (CARDIAC) PF 100 MG/5ML IV SOSY
PREFILLED_SYRINGE | INTRAVENOUS | Status: DC | PRN
  Administered 2019-07-28: 50 mg via INTRAVENOUS

## 2019-07-28 MED ORDER — PROPOFOL 500 MG/50ML IV EMUL
INTRAVENOUS | Status: DC | PRN
  Administered 2019-07-28: 175 ug/kg/min via INTRAVENOUS

## 2019-07-28 MED ORDER — SODIUM CHLORIDE 0.9 % IV SOLN
INTRAVENOUS | Status: DC
  Administered 2019-07-28: 1000 mL via INTRAVENOUS

## 2019-07-28 NOTE — Anesthesia Procedure Notes (Signed)
Date/Time: 07/28/2019 9:01 AM Performed by: Johnna Acosta, CRNA Pre-anesthesia Checklist: Patient identified, Emergency Drugs available, Suction available, Patient being monitored and Timeout performed Patient Re-evaluated:Patient Re-evaluated prior to induction Oxygen Delivery Method: Nasal cannula Preoxygenation: Pre-oxygenation with 100% oxygen Induction Type: IV induction

## 2019-07-28 NOTE — Anesthesia Postprocedure Evaluation (Signed)
Anesthesia Post Note  Patient: Maureen [name]  Procedure(s) Performed: COLONOSCOPY WITH PROPOFOL (N/A )  Patient location during evaluation: PACU Anesthesia Type: General Level of consciousness: awake and alert Pain management: pain level controlled Vital Signs Assessment: post-procedure vital signs reviewed and stable Respiratory status: spontaneous breathing, nonlabored ventilation and respiratory function stable Cardiovascular status: blood pressure returned to baseline and stable Postop Assessment: no apparent nausea or vomiting Anesthetic complications: no     Last Vitals:  Vitals:   07/28/19 0939 07/28/19 0949  BP: 110/79 106/77  Pulse: 79 76  Resp: (!) 21 17  Temp:    SpO2: 99% 99%    Last Pain:  Vitals:   07/28/19 0949  TempSrc:   PainSc: 0-No pain                 Durenda Hurt

## 2019-07-28 NOTE — Anesthesia Preprocedure Evaluation (Addendum)
Anesthesia Evaluation  Patient identified by MRN, date of birth, ID band Patient awake    Reviewed: Allergy & Precautions, H&P , NPO status , Patient's Chart, lab work & pertinent test results  Airway Mallampati: II  TM Distance: >3 FB Neck ROM: full    Dental  (+) Upper Dentures, Lower Dentures   Pulmonary neg pulmonary ROS, neg COPD, Not current smoker,           Cardiovascular hypertension, (-) angina+ dysrhythmias (1st degree AV block)      Neuro/Psych PSYCHIATRIC DISORDERS Depression negative neurological ROS     GI/Hepatic Neg liver ROS, GERD  Controlled,  Endo/Other  Hypothyroidism   Renal/GU negative Renal ROS  negative genitourinary   Musculoskeletal   Abdominal   Peds  Hematology negative hematology ROS (+)   Anesthesia Other Findings Past Medical History: No date: Allergy No date: Corn of toe No date: First degree AV block No date: GERD (gastroesophageal reflux disease) No date: Hammer toe of right foot No date: Hyperglycemia No date: Hyperlipidemia No date: Hypertension No date: Loss of appetite No date: Prolapse of urethra No date: Scoliosis (and kyphoscoliosis), idiopathic No date: Thyroid disease No date: Tinnitus of both ears No date: Vitreous floaters of right eye  Past Surgical History: 1989: ABDOMINAL HYSTERECTOMY 1989: APPENDECTOMY No date: BREAST EXCISIONAL BIOPSY; Bilateral     Comment:  multiple biopsies negative No date: BREAST SURGERY     Comment:  Several 1989: TUMOR EXCISION     Comment:  same time as hysterectomy  BMI    Body Mass Index: 22.85 kg/m      Reproductive/Obstetrics negative OB ROS                            Anesthesia Physical Anesthesia Plan  ASA: II  Anesthesia Plan: General   Post-op Pain Management:    Induction:   PONV Risk Score and Plan: Propofol infusion and TIVA  Airway Management Planned: Natural Airway and  Nasal Cannula  Additional Equipment:   Intra-op Plan:   Post-operative Plan:   Informed Consent: I have reviewed the patients History and Physical, chart, labs and discussed the procedure including the risks, benefits and alternatives for the proposed anesthesia with the patient or authorized representative who has indicated his/her understanding and acceptance.     Dental Advisory Given  Plan Discussed with: Anesthesiologist and CRNA  Anesthesia Plan Comments:         Anesthesia Quick Evaluation

## 2019-07-28 NOTE — Op Note (Signed)
[facility] Gastroenterology Patient Name: Alexine Pilant Procedure Date: 07/28/2019 7:36 AM MRN: [id number] Account #: [id number] Date of Birth: 1946-04-26 Admit Type: Outpatient Age: 73 Room: [facility] ENDO ROOM 4 Gender: Female Note Status: Finalized Procedure:            Colonoscopy Indications:          Last colonoscopy: December 2005, Rectal bleeding Providers:            Lin Landsman MD, MD Referring MD:         Bethena Roys. Sowles, MD (Referring MD) Medicines:            Monitored Anesthesia Care Complications:        No immediate complications. Estimated blood loss: None. Procedure:            Pre-Anesthesia Assessment:                       - Prior to the procedure, a History and Physical was                        performed, and patient medications and allergies were                        reviewed. The patient is competent. The risks and                        benefits of the procedure and the sedation options and                        risks were discussed with the patient. All questions                        were answered and informed consent was obtained.                        Patient identification and proposed procedure were                        verified by the physician, the nurse, the                        anesthesiologist, the anesthetist and the technician in                        the pre-procedure area in the procedure room in the                        endoscopy suite. Mental Status Examination: alert and                        oriented. Airway Examination: normal oropharyngeal                        airway and neck mobility. Respiratory Examination:                        clear to auscultation. CV Examination: normal.                        Prophylactic Antibiotics: The patient does not require  prophylactic antibiotics. Prior Anticoagulants: The                        patient has taken no previous anticoagulant or                         antiplatelet agents. ASA Grade Assessment: II - A                        patient with mild systemic disease. After reviewing the                        risks and benefits, the patient was deemed in                        satisfactory condition to undergo the procedure. The                        anesthesia plan was to use monitored anesthesia care                        (MAC). Immediately prior to administration of                        medications, the patient was re-assessed for adequacy                        to receive sedatives. The heart rate, respiratory rate,                        oxygen saturations, blood pressure, adequacy of                        pulmonary ventilation, and response to care were                        monitored throughout the procedure. The physical status                        of the patient was re-assessed after the procedure.                       After obtaining informed consent, the colonoscope was                        passed under direct vision. Throughout the procedure,                        the patient's blood pressure, pulse, and oxygen                        saturations were monitored continuously. The                        Colonoscope was introduced through the anus and                        advanced to the the cecum, identified by appendiceal  orifice and ileocecal valve. The colonoscopy was                        performed without difficulty. The patient tolerated the                        procedure well. The quality of the bowel preparation                        was evaluated using the BBPS [employer] Bowel Preparation                        Scale) with scores of: Right Colon = 3, Transverse                        Colon = 3 and Left Colon = 3 (entire mucosa seen well                        with no residual staining, small fragments of stool or                        opaque liquid). The total BBPS  score equals 9. Findings:      The perianal and digital rectal examinations were normal. Pertinent       negatives include normal sphincter tone and no palpable rectal lesions.      The colon (entire examined portion) appeared normal.      Non-bleeding external and internal hemorrhoids were found during       retroflexion. The hemorrhoids were large. Impression:           - The entire examined colon is normal.                       - Non-bleeding external and internal hemorrhoids.                       - No specimens collected. Recommendation:       - Discharge patient to home (with escort).                       - Resume previous diet today.                       - Continue present medications.                       - Repeat colonoscopy in 10 years for surveillance.                       - Return to my office PRN. Procedure Code(s):    --- Professional ---                       [phone], Colonoscopy, flexible; diagnostic, including                        collection of specimen(s) by brushing or washing, when                        performed (separate procedure) Diagnosis Code(s):    --- Professional ---  K64.8, Other hemorrhoids                       K62.5, Hemorrhage of anus and rectum CPT copyright 2019 American Medical Association. All rights reserved. The codes documented in this report are preliminary and upon coder review may  be revised to meet current compliance requirements. Dr. Ulyess Mort Lin Landsman MD, MD 07/28/2019 9:17:41 AM This report has been signed electronically. Number of Addenda: 0 Note Initiated On: 07/28/2019 7:36 AM Scope Withdrawal Time: 0 hours 7 minutes 30 seconds  Total Procedure Duration: 0 hours 11 minutes 0 seconds  Estimated Blood Loss: Estimated blood loss: none.      [facility]

## 2019-07-28 NOTE — Anesthesia Post-op Follow-up Note (Signed)
Anesthesia QCDR form completed.        

## 2019-07-28 NOTE — H&P (Signed)
Maureen Repress, MD [address]  [address]  [place], [place] [address]  Main: [phone]  Fax: [phone] Pager: [phone]  Primary Care Physician:  Maureen Cory, MD Primary Gastroenterologist:  Dr. Arlyss [name]  Pre-Procedure History & Physical: HPI:  Maureen [name] is a 73 y.o. female is here for an colonoscopy.   Past Medical History:  Diagnosis Date  . Allergy   . Corn of toe   . First degree AV block   . GERD (gastroesophageal reflux disease)   . Hammer toe of right foot   . Hyperglycemia   . Hyperlipidemia   . Hypertension   . Loss of appetite   . Prolapse of urethra   . Scoliosis (and kyphoscoliosis), idiopathic   . Thyroid disease   . Tinnitus of both ears   . Vitreous floaters of right eye     Past Surgical History:  Procedure Laterality Date  . ABDOMINAL HYSTERECTOMY  1989  . APPENDECTOMY  1989  . BREAST EXCISIONAL BIOPSY Bilateral    multiple biopsies negative  . BREAST SURGERY     Several  . TUMOR EXCISION  1989   same time as hysterectomy    Prior to Admission medications   Medication Sig Start Date End Date Taking? Authorizing Provider  Ascorbic Acid (VITAMIN C) 1000 MG tablet Take 1,000 mg by mouth daily.   Yes [provider]  conjugated estrogens (PREMARIN) vaginal cream Place 1 Applicatorful vaginally daily. 01/11/19  Yes [name], Maureen Hefty, MD  famotidine (PEPCID) 40 MG tablet Take 1 tablet (40 mg total) by mouth daily. 01/11/19  Yes [name], Maureen Hefty, MD  fluticasone (FLONASE) 50 MCG/ACT nasal spray USE 1 SPRAY IN EACH NOSTRIL ONCE DAILY 04/18/19  Yes Maureen Cory, MD  Garlic 1000 MG CAPS Take by mouth.   Yes [provider]  levothyroxine (SYNTHROID) 88 MCG tablet Take 1 tablet (88 mcg total) by mouth daily before breakfast. 06/27/19  Yes [name], Maureen Hefty, MD  Multiple Vitamins-Minerals (AIRBORNE PO) Take 1 each by mouth daily.   Yes [provider]  TURMERIC CURCUMIN PO Take 550 mg by mouth daily.   Yes  [provider]  triamcinolone cream (KENALOG) 0.1 % Apply 1 application topically 2 (two) times daily. Patient not taking: Reported on 07/14/2019 01/11/19   Maureen Cory, MD    Allergies as of 07/15/2019 - Review Complete 07/14/2019  Allergen Reaction Noted  . Aspirin  06/26/2015  . Citalopram Other (See Comments) 06/26/2015  . Codeine Nausea And Vomiting 04/02/2015  . Penicillins Rash 04/02/2015    Family History  Problem Relation Age of Onset  . Diabetes Father 29       DM complications  . Hypertension Mother 3       CKD  . Thyroid disease Mother   . Heart failure Mother   . Kidney disease Mother   . Breast cancer Sister 72  . Kidney disease Other   . Breast cancer Maternal Aunt   . Breast cancer Cousin   . Anuerysm Son 51  . Diabetes Son     Social History   Socioeconomic History  . Marital status: Widowed    Spouse name: Not on file  . Number of children: 3  . Years of education: 21  . Highest education level: 12th grade  Occupational History  . Occupation: Retired  Engineer, production  . Financial resource strain: Not hard at all  . Food insecurity    Worry: Never true    Inability: Never  true  . Transportation needs    Medical: No    Non-medical: No  Tobacco Use  . Smoking status: Never Smoker  . Smokeless tobacco: Never Used  . Tobacco comment: smoking cessation materials not required  Substance and Sexual Activity  . Alcohol use: Never    Alcohol/week: 0.0 standard drinks    Frequency: Never  . Drug use: No  . Sexual activity: Not Currently    Partners: Male  Lifestyle  . Physical activity    Days per week: 5 days    Minutes per session: 30 min  . Stress: Not at all  Relationships  . Social connections    Talks on phone: More than three times a week    Gets together: More than three times a week    Attends religious service: More than 4 times per year    Active member of club or organization: Yes    Attends meetings of clubs or  organizations: More than 4 times per year    Relationship status: Widowed  . Intimate partner violence    Fear of current or ex partner: No    Emotionally abused: No    Physically abused: No    Forced sexual activity: No  Other Topics Concern  . Not on file  Social History Narrative  . Not on file    Review of Systems: See HPI, otherwise negative ROS  Physical Exam: BP (!) 133/95   Pulse (!) 108   Temp (!) 96.9 F (36.1 C) (Tympanic)   Resp 20   Ht 5\' 3"  (1.6 m)   Wt 58.5 kg   SpO2 100%   BMI 22.85 kg/m  General:   Alert,  pleasant and cooperative in NAD Head:  Normocephalic and atraumatic. Neck:  Supple; no masses or thyromegaly. Lungs:  Clear throughout to auscultation.    Heart:  Regular rate and rhythm. Abdomen:  Soft, nontender and nondistended. Normal bowel sounds, without guarding, and without rebound.   Neurologic:  Alert and  oriented x4;  grossly normal neurologically.  Impression/Plan: Maureen [name] is here for an colonoscopy to be performed for rectal bleeding  Risks, benefits, limitations, and alternatives regarding  colonoscopy have been reviewed with the patient.  Questions have been answered.  All parties agreeable.   Maureen Sear, MD  07/28/2019, 8:52 AM

## 2019-07-28 NOTE — Transfer of Care (Signed)
Immediate Anesthesia Transfer of Care Note  Patient: Maureen [name]  Procedure(s) Performed: COLONOSCOPY WITH PROPOFOL (N/A )  Patient Location: PACU  Anesthesia Type:General  Level of Consciousness: sedated  Airway & Oxygen Therapy: Patient Spontanous Breathing  Post-op Assessment: Report given to RN and Post -op Vital signs reviewed and stable  Post vital signs: Reviewed and stable  Last Vitals:  Vitals Value Taken Time  BP    Temp    Pulse    Resp    SpO2      Last Pain:  Vitals:   07/28/19 0816  TempSrc: Tympanic  PainSc: 0-No pain         Complications: No apparent anesthesia complications

## 2019-07-29 ENCOUNTER — Encounter: Payer: Self-pay | Admitting: Gastroenterology

## 2019-08-04 ENCOUNTER — Other Ambulatory Visit: Payer: Self-pay

## 2019-08-04 ENCOUNTER — Ambulatory Visit: Payer: Medicare HMO

## 2019-08-05 ENCOUNTER — Ambulatory Visit (INDEPENDENT_AMBULATORY_CARE_PROVIDER_SITE_OTHER): Payer: Medicare HMO

## 2019-08-05 VITALS — Ht 62.0 in | Wt 130.0 lb

## 2019-08-05 DIAGNOSIS — Z780 Asymptomatic menopausal state: Secondary | ICD-10-CM

## 2019-08-05 DIAGNOSIS — Z0000 Encounter for general adult medical examination without abnormal findings: Secondary | ICD-10-CM | POA: Diagnosis not present

## 2019-08-05 DIAGNOSIS — Z1231 Encounter for screening mammogram for malignant neoplasm of breast: Secondary | ICD-10-CM | POA: Diagnosis not present

## 2019-08-05 NOTE — Progress Notes (Signed)
Subjective:   Maureen [name] is a 73 y.o. female who presents for Medicare Annual (Subsequent) preventive examination.  Virtual Visit via Telephone Note  I connected with Maureen [name] on 08/05/19 at 11:20 AM EDT by telephone and verified that I am speaking with the correct person using two identifiers.  Medicare Annual Wellness visit completed telephonically due to Covid-19 pandemic.   Location: Patient: home Provider: office   I discussed the limitations, risks, security and privacy concerns of performing an evaluation and management service by telephone and the availability of in person appointments. The patient expressed understanding and agreed to proceed.  Some vital signs may be absent or patient reported.   Reather LittlerKasey Arnett Duddy, LPN    Review of Systems:   Cardiac Risk Factors include: advanced age (>5355men, 22>65 women)     Objective:     Vitals: Ht 5\' 2"  (1.575 m)   Wt 130 lb (59 kg)   BMI 23.78 kg/m   Body mass index is 23.78 kg/m.  Advanced Directives 08/05/2019 07/28/2019 05/14/2018 04/08/2017 10/06/2016 08/12/2016 06/02/2016  Does Patient Have a Medical Advance Directive? Yes Yes No No Yes Yes Yes  Type of Estate agentAdvance Directive Healthcare Power of StanleyAttorney;Living will Living will - - Healthcare Power of Attorney Living will Living will  Does patient want to make changes to medical advance directive? - - - - - No - Patient declined No - Patient declined  Copy of Healthcare Power of Attorney in Chart? No - copy requested - - - No - copy requested No - copy requested No - copy requested  Would patient like information on creating a medical advance directive? - - Yes (MAU/Ambulatory/Procedural Areas - Information given) - - - No - patient declined information    Tobacco Social History   Tobacco Use  Smoking Status Never Smoker  Smokeless Tobacco Never Used  Tobacco Comment   smoking cessation materials not required     Counseling given: Not Answered Comment: smoking  cessation materials not required   Clinical Intake:  Pre-visit preparation completed: Yes  Pain : No/denies pain     BMI - recorded: 23.78 Nutritional Status: BMI of 19-24  Normal Nutritional Risks: None Diabetes: No  How often do you need to have someone help you when you read instructions, pamphlets, or other written materials from your doctor or pharmacy?: 1 - Never  Interpreter Needed?: No  Information entered by :: Reather LittlerKasey Javin Nong LPN  Past Medical History:  Diagnosis Date  . Allergy   . Corn of toe   . First degree AV block   . GERD (gastroesophageal reflux disease)   . Hammer toe of right foot   . Hyperglycemia   . Hyperlipidemia   . Hypertension   . Loss of appetite   . Prolapse of urethra   . Scoliosis (and kyphoscoliosis), idiopathic   . Thyroid disease   . Tinnitus of both ears   . Vitreous floaters of right eye    Past Surgical History:  Procedure Laterality Date  . ABDOMINAL HYSTERECTOMY  1989  . APPENDECTOMY  1989  . BREAST EXCISIONAL BIOPSY Bilateral    multiple biopsies negative  . BREAST SURGERY     Several  . COLONOSCOPY WITH PROPOFOL N/A 07/28/2019   Procedure: COLONOSCOPY WITH PROPOFOL;  Surgeon: Toney ReilVanga, Rohini Reddy, MD;  [facility];  Service: Gastroenterology;  Laterality: N/A;  . TUMOR EXCISION  1989   same time as hysterectomy   Family History  Problem Relation Age of  Onset  . Diabetes Father 77       DM complications  . Hypertension Mother 17       CKD  . Thyroid disease Mother   . Heart failure Mother   . Kidney disease Mother   . Breast cancer Sister 53  . Kidney disease Other   . Breast cancer Maternal Aunt   . Breast cancer Cousin   . Anuerysm Son 7  . Diabetes Son    Social History   Socioeconomic History  . Marital status: Widowed    Spouse name: Not on file  . Number of children: 3  . Years of education: 82  . Highest education level: 12th grade  Occupational History  . Occupation: Retired  Photographer  . Financial resource strain: Not hard at all  . Food insecurity    Worry: Never true    Inability: Never true  . Transportation needs    Medical: No    Non-medical: No  Tobacco Use  . Smoking status: Never Smoker  . Smokeless tobacco: Never Used  . Tobacco comment: smoking cessation materials not required  Substance and Sexual Activity  . Alcohol use: Never    Alcohol/week: 0.0 standard drinks    Frequency: Never  . Drug use: No  . Sexual activity: Not Currently    Partners: Male  Lifestyle  . Physical activity    Days per week: 3 days    Minutes per session: 30 min  . Stress: Not at all  Relationships  . Social connections    Talks on phone: More than three times a week    Gets together: More than three times a week    Attends religious service: More than 4 times per year    Active member of club or organization: Yes    Attends meetings of clubs or organizations: More than 4 times per year    Relationship status: Widowed  Other Topics Concern  . Not on file  Social History Narrative  . Not on file    Outpatient Encounter Medications as of 08/05/2019  Medication Sig  . Ascorbic Acid (VITAMIN C) 1000 MG tablet Take 1,000 mg by mouth daily.  . famotidine (PEPCID) 40 MG tablet Take 1 tablet (40 mg total) by mouth daily.  . fluticasone (FLONASE) 50 MCG/ACT nasal spray USE 1 SPRAY IN EACH NOSTRIL ONCE DAILY  . Garlic 2355 MG CAPS Take by mouth.  . Iron-Vitamins (GERITOL COMPLETE PO) Take 1 tablet by mouth daily.  Marland Kitchen levothyroxine (SYNTHROID) 88 MCG tablet Take 1 tablet (88 mcg total) by mouth daily before breakfast.  . triamcinolone cream (KENALOG) 0.1 % Apply 1 application topically 2 (two) times daily.  . TURMERIC CURCUMIN PO Take 550 mg by mouth daily.  Marland Kitchen conjugated estrogens (PREMARIN) vaginal cream Place 1 Applicatorful vaginally daily. (Patient not taking: Reported on 08/05/2019)  . [DISCONTINUED] Multiple Vitamins-Minerals (AIRBORNE PO) Take 1 each by mouth  daily.   No facility-administered encounter medications on file as of 08/05/2019.     Activities of Daily Living In your present state of health, do you have any difficulty performing the following activities: 08/05/2019 06/27/2019  Hearing? N N  Comment declines hearing aids -  Vision? N N  Difficulty concentrating or making decisions? N N  Walking or climbing stairs? N N  Dressing or bathing? N N  Doing errands, shopping? N N  Preparing Food and eating ? N -  Using the Toilet? N -  In the past  six months, have you accidently leaked urine? N -  Do you have problems with loss of bowel control? N -  Managing your Medications? N -  Managing your Finances? N -  Housekeeping or managing your Housekeeping? N -  Some recent data might be hidden    Patient Care Team: Alba Cory, MD as PCP - General (Family Medicine) Isla Pence, OD as Consulting Physician (Optometry) Wyn Quaker, Marlow Baars, MD as Consulting Physician (Vascular Surgery)    Assessment:   This is a routine wellness examination for Danisha.  Exercise Activities and Dietary recommendations Current Exercise Habits: Home exercise routine, Type of exercise: walking, Time (Minutes): 30, Frequency (Times/Week): 3, Weekly Exercise (Minutes/Week): 90, Intensity: Mild, Exercise limited by: None identified  Goals    . DIET - INCREASE WATER INTAKE     Recommend to drink at least 6-8 8oz glasses of water per day.       Fall Risk Fall Risk  08/05/2019 06/27/2019 01/11/2019 09/30/2018 07/30/2018  Falls in the past year? 0 0 0 0 No  Number falls in past yr: 0 0 0 0 -  Injury with Fall? 0 0 0 0 -  Risk for fall due to : - - - - -  Risk for fall due to: Comment - - - - -  Follow up Falls prevention discussed - - - -   FALL RISK PREVENTION PERTAINING TO THE HOME:  Any stairs in or around the home? No If so, do they handrails? No  Home free of loose throw rugs in walkways, pet beds, electrical cords, etc? Yes  Adequate lighting in  your home to reduce risk of falls? Yes   ASSISTIVE DEVICES UTILIZED TO PREVENT FALLS:  Life alert? No  Use of a cane, walker or w/c? No  Grab bars in the bathroom? No  Shower chair or bench in shower? No  Elevated toilet seat or a handicapped toilet? No   DME ORDERS:  DME order needed?  No   TIMED UP AND GO:  Was the test performed? No . Telephonic visit  Education: Fall risk prevention has been discussed.  Intervention(s) required? No    Depression Screen PHQ 2/9 Scores 08/05/2019 06/27/2019 01/11/2019 09/30/2018  PHQ - 2 Score 1 2 0 0  PHQ- 9 Score 0     Cognitive Function     6CIT Screen 08/05/2019 05/14/2018  What Year? 0 points 0 points  What month? 0 points 0 points  What time? 0 points 0 points  Count back from 20 0 points 0 points  Months in reverse 0 points 4 points  Repeat phrase 4 points 10 points  Total Score 4 14    There is no immunization history for the selected administration types on file for this patient.  Qualifies for Shingles Vaccine? Yes . Due for Shingrix. Education has been provided regarding the importance of this vaccine. Pt has been advised to call insurance company to determine out of pocket expense. Advised may also receive vaccine at local pharmacy or Health Dept. Verbalized acceptance and understanding.  Tdap: Although this vaccine is not a covered service during a Wellness Exam, does the patient still wish to receive this vaccine today?  No .  Education has been provided regarding the importance of this vaccine. Advised may receive this vaccine at local pharmacy or Health Dept. Aware to provide a copy of the vaccination record if obtained from local pharmacy or Health Dept. Verbalized acceptance and understanding.  Flu Vaccine: Due for Flu vaccine. Does the patient want to receive this vaccine today?  No . Education has been provided regarding the importance of this vaccine but still declined. Advised may receive this vaccine at local  pharmacy or Health Dept. Aware to provide a copy of the vaccination record if obtained from local pharmacy or Health Dept. Verbalized acceptance and understanding.  Pneumococcal Vaccine: Due for Pneumococcal vaccine. Does the patient want to receive this vaccine today?  No . Education has been provided regarding the importance of this vaccine but still declined. Advised may receive this vaccine at local pharmacy or Health Dept. Aware to provide a copy of the vaccination record if obtained from local pharmacy or Health Dept. Verbalized acceptance and understanding.   Screening Tests Health Maintenance  Topic Date Due  . INFLUENZA VACCINE  01/25/2020 (Originally 05/28/2019)  . TETANUS/TDAP  06/26/2020 (Originally 11/12/1964)  . PNA vac Low Risk Adult (1 of 2 - PCV13) 06/26/2020 (Originally 11/12/2010)  . DEXA SCAN  08/18/2029 (Originally 03-Aug-1946)  . MAMMOGRAM  12/11/2019  . COLONOSCOPY  07/27/2029  . Hepatitis C Screening  Completed   Cancer Screenings:  Colorectal Screening: Completed 07/28/19. No longer required.   Mammogram: Completed 12/10/18. Repeat every year; No longer required. Ordered today. Pt provided with contact information and advised to call to schedule appt.   Bone Density: Completed 01/11/15. Results reflect unavailable. Repeat every 2 years. Ordered today. Pt provided with contact information and advised to call to schedule appt.   Lung Cancer Screening: (Low Dose CT Chest recommended if Age 65-80 years, 30 pack-year currently smoking OR have quit w/in 15years.) does not qualify.    Additional Screening:  Hepatitis C Screening: does qualify; Completed 04/26/12  Vision Screening: Recommended annual ophthalmology exams for early detection of glaucoma and other disorders of the eye. Is the patient up to date with their annual eye exam?  Yes  Who is the provider or what is the name of the office in which the pt attends annual eye exams? Dr. Clydene Pugh  Dental Screening:  Recommended annual dental exams for proper oral hygiene  Community Resource Referral:  CRR required this visit?  No       Plan:     I have personally reviewed and addressed the Medicare Annual Wellness questionnaire and have noted the following in the patient's chart:  A. Medical and social history B. Use of alcohol, tobacco or illicit drugs  C. Current medications and supplements D. Functional ability and status E.  Nutritional status F.  Physical activity G. Advance directives H. List of other physicians I.  Hospitalizations, surgeries, and ER visits in previous 12 months J.  Vitals K. Screenings such as hearing and vision if needed, cognitive and depression L. Referrals and appointments   In addition, I have reviewed and discussed with patient certain preventive protocols, quality metrics, and best practice recommendations. A written personalized care plan for preventive services as well as general preventive health recommendations were provided to patient.   Signed,  Reather Littler, LPN Nurse Health Advisor   Nurse Notes:none

## 2019-08-05 NOTE — Addendum Note (Signed)
Addended by: Clemetine Marker D on: 08/05/2019 11:52 AM   Modules accepted: Orders

## 2019-08-05 NOTE — Patient Instructions (Signed)
[name] , Thank you for taking time to come for your Medicare Wellness Visit. I appreciate your ongoing commitment to your health goals. Please review the following plan we discussed and let me know if I can assist you in the future.   Screening recommendations/referrals: Colonoscopy: done 07/28/19 Mammogram: done 12/10/18. Please call [phone] to schedule your mammogram and bone density screening. Bone Density: done 01/11/15 Recommended yearly ophthalmology/optometry visit for glaucoma screening and checkup Recommended yearly dental visit for hygiene and checkup  Vaccinations: Influenza vaccine: postponed Pneumococcal vaccine: postponed Tdap vaccine: postponed Shingles vaccine: postponed    Advanced directives: Please bring a copy of your health care power of attorney and living will to the office at your convenience.  Conditions/risks identified: Recommend healthy eating and physical activity  Next appointment: Please follow up in one year for your Medicare Annual Wellness visit.     Preventive Care 18 Years and Older, Female Preventive care refers to lifestyle choices and visits with your health care provider that can promote health and wellness. What does preventive care include?  A yearly physical exam. This is also called an annual well check.  Dental exams once or twice a year.  Routine eye exams. Ask your health care provider how often you should have your eyes checked.  Personal lifestyle choices, including:  Daily care of your teeth and gums.  Regular physical activity.  Eating a healthy diet.  Avoiding tobacco and drug use.  Limiting alcohol use.  Practicing safe sex.  Taking low-dose aspirin every day.  Taking vitamin and mineral supplements as recommended by your health care provider. What happens during an annual well check? The services and screenings done by your health care provider during your annual well check will depend on your age, overall  health, lifestyle risk factors, and family history of disease. Counseling  Your health care provider may ask you questions about your:  Alcohol use.  Tobacco use.  Drug use.  Emotional well-being.  Home and relationship well-being.  Sexual activity.  Eating habits.  History of falls.  Memory and ability to understand (cognition).  Work and work Statistician.  Reproductive health. Screening  You may have the following tests or measurements:  Height, weight, and BMI.  Blood pressure.  Lipid and cholesterol levels. These may be checked every 5 years, or more frequently if you are over 27 years old.  Skin check.  Lung cancer screening. You may have this screening every year starting at age 62 if you have a 30-pack-year history of smoking and currently smoke or have quit within the past 15 years.  Fecal occult blood test (FOBT) of the stool. You may have this test every year starting at age 32.  Flexible sigmoidoscopy or colonoscopy. You may have a sigmoidoscopy every 5 years or a colonoscopy every 10 years starting at age 56.  Hepatitis C blood test.  Hepatitis B blood test.  Sexually transmitted disease (STD) testing.  Diabetes screening. This is done by checking your blood sugar (glucose) after you have not eaten for a while (fasting). You may have this done every 1-3 years.  Bone density scan. This is done to screen for osteoporosis. You may have this done starting at age 60.  Mammogram. This may be done every 1-2 years. Talk to your health care provider about how often you should have regular mammograms. Talk with your health care provider about your test results, treatment options, and if necessary, the need for more tests. Vaccines  Your health  care provider may recommend certain vaccines, such as:  Influenza vaccine. This is recommended every year.  Tetanus, diphtheria, and acellular pertussis (Tdap, Td) vaccine. You may need a Td booster every 10 years.   Zoster vaccine. You may need this after age 53.  Pneumococcal 13-valent conjugate (PCV13) vaccine. One dose is recommended after age 62.  Pneumococcal polysaccharide (PPSV23) vaccine. One dose is recommended after age 73. Talk to your health care provider about which screenings and vaccines you need and how often you need them. This information is not intended to replace advice given to you by your health care provider. Make sure you discuss any questions you have with your health care provider. Document Released: 11/09/2015 Document Revised: 07/02/2016 Document Reviewed: 08/14/2015 Elsevier Interactive Patient Education  2017 [place] Prevention in the Home Falls can cause injuries. They can happen to people of all ages. There are many things you can do to make your home safe and to help prevent falls. What can I do on the outside of my home?  Regularly fix the edges of walkways and driveways and fix any cracks.  Remove anything that might make you trip as you walk through a door, such as a raised step or threshold.  Trim any bushes or trees on the path to your home.  Use bright outdoor lighting.  Clear any walking paths of anything that might make someone trip, such as rocks or tools.  Regularly check to see if handrails are loose or broken. Make sure that both sides of any steps have handrails.  Any raised decks and porches should have guardrails on the edges.  Have any leaves, snow, or ice cleared regularly.  Use sand or salt on walking paths during winter.  Clean up any spills in your garage right away. This includes oil or grease spills. What can I do in the bathroom?  Use night lights.  Install grab bars by the toilet and in the tub and shower. Do not use towel bars as grab bars.  Use non-skid mats or decals in the tub or shower.  If you need to sit down in the shower, use a plastic, non-slip stool.  Keep the floor dry. Clean up any water that spills  on the floor as soon as it happens.  Remove soap buildup in the tub or shower regularly.  Attach bath mats securely with double-sided non-slip rug tape.  Do not have throw rugs and other things on the floor that can make you trip. What can I do in the bedroom?  Use night lights.  Make sure that you have a light by your bed that is easy to reach.  Do not use any sheets or blankets that are too big for your bed. They should not hang down onto the floor.  Have a firm chair that has side arms. You can use this for support while you get dressed.  Do not have throw rugs and other things on the floor that can make you trip. What can I do in the kitchen?  Clean up any spills right away.  Avoid walking on wet floors.  Keep items that you use a lot in easy-to-reach places.  If you need to reach something above you, use a strong step stool that has a grab bar.  Keep electrical cords out of the way.  Do not use floor polish or wax that makes floors slippery. If you must use wax, use non-skid floor wax.  Do not have  throw rugs and other things on the floor that can make you trip. What can I do with my stairs?  Do not leave any items on the stairs.  Make sure that there are handrails on both sides of the stairs and use them. Fix handrails that are broken or loose. Make sure that handrails are as long as the stairways.  Check any carpeting to make sure that it is firmly attached to the stairs. Fix any carpet that is loose or worn.  Avoid having throw rugs at the top or bottom of the stairs. If you do have throw rugs, attach them to the floor with carpet tape.  Make sure that you have a light switch at the top of the stairs and the bottom of the stairs. If you do not have them, ask someone to add them for you. What else can I do to help prevent falls?  Wear shoes that:  Do not have high heels.  Have rubber bottoms.  Are comfortable and fit you well.  Are closed at the toe. Do  not wear sandals.  If you use a stepladder:  Make sure that it is fully opened. Do not climb a closed stepladder.  Make sure that both sides of the stepladder are locked into place.  Ask someone to hold it for you, if possible.  Clearly mark and make sure that you can see:  Any grab bars or handrails.  First and last steps.  Where the edge of each step is.  Use tools that help you move around (mobility aids) if they are needed. These include:  Canes.  Walkers.  Scooters.  Crutches.  Turn on the lights when you go into a dark area. Replace any light bulbs as soon as they burn out.  Set up your furniture so you have a clear path. Avoid moving your furniture around.  If any of your floors are uneven, fix them.  If there are any pets around you, be aware of where they are.  Review your medicines with your doctor. Some medicines can make you feel dizzy. This can increase your chance of falling. Ask your doctor what other things that you can do to help prevent falls. This information is not intended to replace advice given to you by your health care provider. Make sure you discuss any questions you have with your health care provider. Document Released: 08/09/2009 Document Revised: 03/20/2016 Document Reviewed: 11/17/2014 Elsevier Interactive Patient Education  2017 [employer].

## 2019-08-12 ENCOUNTER — Ambulatory Visit: Payer: Medicare HMO | Admitting: Family Medicine

## 2019-08-17 ENCOUNTER — Ambulatory Visit (INDEPENDENT_AMBULATORY_CARE_PROVIDER_SITE_OTHER): Payer: Medicare HMO | Admitting: Family Medicine

## 2019-08-17 ENCOUNTER — Encounter: Payer: Self-pay | Admitting: Family Medicine

## 2019-08-17 ENCOUNTER — Other Ambulatory Visit: Payer: Self-pay

## 2019-08-17 VITALS — BP 140/90 | HR 96 | Temp 97.5°F | Resp 16 | Ht 62.0 in | Wt 126.5 lb

## 2019-08-17 DIAGNOSIS — F331 Major depressive disorder, recurrent, moderate: Secondary | ICD-10-CM | POA: Diagnosis not present

## 2019-08-17 MED ORDER — SERTRALINE HCL 50 MG PO TABS: 50.0000 mg | ORAL_TABLET | Freq: Every day | ORAL | 1 refills | Status: DC

## 2019-08-17 NOTE — Progress Notes (Signed)
Name: Maureen [name]   MRN: [id number]    DOB: 10/07/1946   Date:[name]10/21/2020       Progress Note  Subjective  Chief Complaint  Chief Complaint  Patient presents with  . personal issues    HPI  Depression in remission: shehas significant family history mother and sister that had to be hospitalized for major depression . Personally she had been depressed on and off all her life, severe symptoms a few years ago when she lost a lot of weight , her weight was down 119 lbs , she was given multiple medications in the past including Remeron. She states 2020 has been hard, lost loved ones ( a friend and nephew ), social isolations has been hard. She has recently noticed lack of appetite again, she has occasional crying, she has been sleeping okay, she states mind wanders at times. She is willing to try medication again. Denies suicidal thoughts or ideation  . She states her children noticed that she is not as engaged and asked her to come in to be evaluated    Patient Active Problem List   Diagnosis Date Noted  . Rectal bleeding   . Low grade squamous intraepith lesion on cytologic smear cervix (lgsil) 08/04/2018  . Chronic venous insufficiency 07/05/2018  . Lymphedema 07/05/2018  . Hypertension 04/02/2018  . Swelling of limb 04/02/2018  . Depression, major, recurrent, mild (HCC) 12/29/2017  . Leukopenia 05/24/2017  . Allergic rhinitis, seasonal 06/29/2015  . Clavus 06/29/2015  . 1st degree AV block 06/29/2015  . Hammer toe 06/29/2015  . H/O: HTN (hypertension) 06/29/2015  . Blood glucose elevated 06/29/2015  . Floater, vitreous 06/29/2015  . Bilateral tinnitus 06/29/2015  . Hypothyroidism (acquired) 04/02/2015  . Gastroesophageal reflux disease 04/02/2015  . Hammer toe of right foot 04/02/2015  . Scoliosis of thoracic spine 04/02/2015  . Urethral prolapse 04/02/2015  . Corn of toe 04/02/2015  . Hyperlipidemia 04/02/2015  . Varicose veins of both lower extremities 04/02/2015     Past Surgical History:  Procedure Laterality Date  . ABDOMINAL HYSTERECTOMY  1989  . APPENDECTOMY  1989  . BREAST EXCISIONAL BIOPSY Bilateral    multiple biopsies negative  . BREAST SURGERY     Several  . COLONOSCOPY WITH PROPOFOL N/A 07/28/2019   Procedure: COLONOSCOPY WITH PROPOFOL;  Surgeon: Toney ReilVanga, Rohini Reddy, MD;  [facility];  Service: Gastroenterology;  Laterality: N/A;  . TUMOR EXCISION  1989   same time as hysterectomy    Family History  Problem Relation Age of Onset  . Diabetes Father 4576       DM complications  . Hypertension Mother 4479       CKD  . Thyroid disease Mother   . Heart failure Mother   . Kidney disease Mother   . Breast cancer Sister 3553  . Kidney disease Other   . Breast cancer Maternal Aunt   . Breast cancer Cousin   . Anuerysm Son 51  . Diabetes Son     Social History   Socioeconomic History  . Marital status: Widowed    Spouse name: Not on file  . Number of children: 3  . Years of education: 3512  . Highest education level: 12th grade  Occupational History  . Occupation: Retired  Engineer, productionocial Needs  . Financial resource strain: Not hard at all  . Food insecurity    Worry: Never true    Inability: Never true  . Transportation needs    Medical: No  Non-medical: No  Tobacco Use  . Smoking status: Never Smoker  . Smokeless tobacco: Never Used  . Tobacco comment: smoking cessation materials not required  Substance and Sexual Activity  . Alcohol use: Never    Alcohol/week: 0.0 standard drinks    Frequency: Never  . Drug use: No  . Sexual activity: Not Currently    Partners: Male  Lifestyle  . Physical activity    Days per week: 3 days    Minutes per session: 30 min  . Stress: Not at all  Relationships  . Social connections    Talks on phone: More than three times a week    Gets together: More than three times a week    Attends religious service: More than 4 times per year    Active member of club or organization:  Yes    Attends meetings of clubs or organizations: More than 4 times per year    Relationship status: Widowed  . Intimate partner violence    Fear of current or ex partner: No    Emotionally abused: No    Physically abused: No    Forced sexual activity: No  Other Topics Concern  . Not on file  Social History Narrative  . Not on file     Current Outpatient Medications:  .  Ascorbic Acid (VITAMIN C) 1000 MG tablet, Take 1,000 mg by mouth daily., Disp: , Rfl:  .  conjugated estrogens (PREMARIN) vaginal cream, Place 1 Applicatorful vaginally daily., Disp: 42.5 g, Rfl: 12 .  famotidine (PEPCID) 40 MG tablet, Take 1 tablet (40 mg total) by mouth daily., Disp: 90 tablet, Rfl: 0 .  fluticasone (FLONASE) 50 MCG/ACT nasal spray, USE 1 SPRAY IN EACH NOSTRIL ONCE DAILY, Disp: 16 g, Rfl: 0 .  Garlic 1000 MG CAPS, Take by mouth., Disp: , Rfl:  .  Iron-Vitamins (GERITOL COMPLETE PO), Take 1 tablet by mouth daily., Disp: , Rfl:  .  levothyroxine (SYNTHROID) 88 MCG tablet, Take 1 tablet (88 mcg total) by mouth daily before breakfast., Disp: 90 tablet, Rfl: 0 .  triamcinolone cream (KENALOG) 0.1 %, Apply 1 application topically 2 (two) times daily., Disp: 45 g, Rfl: 0 .  TURMERIC CURCUMIN PO, Take 550 mg by mouth daily., Disp: , Rfl:   Allergies  Allergen Reactions  . Aspirin     Tinnitus  . Citalopram Other (See Comments)  . Codeine Nausea And Vomiting  . Penicillins Rash    Has patient had a PCN reaction causing immediate rash, facial/tongue/throat swelling, SOB or lightheadedness with hypotension: Yes Has patient had a PCN reaction causing severe rash involving mucus membranes or skin necrosis: No Has patient had a PCN reaction that required hospitalization No Has patient had a PCN reaction occurring within the last 10 years: No If all of the above answers are "NO", then may proceed with Cephalosporin use.    I personally reviewed active problem list, medication list, allergies, family  history, social history, health maintenance with the patient/caregiver today.   ROS  Constitutional: Negative for fever or weight change.  Respiratory: Negative for cough and shortness of breath.   Cardiovascular: Negative for chest pain or palpitations.  Gastrointestinal: Negative for abdominal pain, no bowel changes.  Musculoskeletal: Negative for gait problem or joint swelling.  Skin: Negative for rash.  Neurological: Negative for dizziness or headache.  No other specific complaints in a complete review of systems (except as listed in HPI above).   Objective  Vitals:   08/17/19 1526  08/17/19 1527  BP: (!) 160/100 140/90  Pulse: 96   Resp: 16   Temp: (!) 97.5 F (36.4 C)   TempSrc: Temporal   SpO2: 96%   Weight: 126 lb 8 oz (57.4 kg)   Height:  (1.575 m)     Body mass index is 23.14 kg/m.  Physical Exam  Constitutional: Patient appears well-developed and well-nourished. No distress.  HEENT: head atraumatic, normocephalic, pupils equal and reactive to light Cardiovascular: Normal rate, regular rhythm and normal heart sounds.  No murmur heard. No BLE edema. Pulmonary/Chest: Effort normal and breath sounds normal. No respiratory distress. Abdominal: Soft.  There is no tenderness. Psychiatric: Patient has a normal mood and affect. behavior is normal. Judgment and thought content normal.  Recent Results (from the past 2160 hour(s))  COMPLETE METABOLIC PANEL WITH GFR     Status: None   Collection Time: 06/17/19  9:00 AM  Result Value Ref Range   Glucose, Bld 85 65 - 99 mg/dL    Comment: .            Fasting reference interval .    BUN 12 7 - 25 mg/dL   Creat 1.61 0.96 - 0.45 mg/dL    Comment: For patients >32 years of age, the reference limit for Creatinine is approximately 13% higher for people identified as African-American. .    GFR, Est Non African American 86 > OR = 60 mL/min/1.58m2   GFR, Est African American 100 > OR = 60 mL/min/1.89m2    BUN/Creatinine Ratio NOT APPLICABLE 6 - 22 (calc)   Sodium 142 135 - 146 mmol/L   Potassium 4.8 3.5 - 5.3 mmol/L   Chloride 104 98 - 110 mmol/L   CO2 31 20 - 32 mmol/L   Calcium 9.2 8.6 - 10.4 mg/dL   Total Protein 7.0 6.1 - 8.1 g/dL   Albumin 4.1 3.6 - 5.1 g/dL   Globulin 2.9 1.9 - 3.7 g/dL (calc)   AG Ratio 1.4 1.0 - 2.5 (calc)   Total Bilirubin 0.5 0.2 - 1.2 mg/dL   Alkaline phosphatase (APISO) 66 37 - 153 U/L   AST 24 10 - 35 U/L   ALT 16 6 - 29 U/L  CBC with Differential/Platelet     Status: Abnormal   Collection Time: 06/17/19  9:00 AM  Result Value Ref Range   WBC 3.4 (L) 3.8 - 10.8 Thousand/uL   RBC 4.04 3.80 - 5.10 Million/uL   Hemoglobin 11.9 11.7 - 15.5 g/dL   HCT 40.9 81.1 - 91.4 %   MCV 90.1 80.0 - 100.0 fL   MCH 29.5 27.0 - 33.0 pg   MCHC 32.7 32.0 - 36.0 g/dL   RDW 78.2 95.6 - 21.3 %   Platelets 182 140 - 400 Thousand/uL   MPV 11.6 7.5 - 12.5 fL   Neutro Abs 966 (L) 1,500 - 7,800 cells/uL   Lymphs Abs 1,816 850 - 3,900 cells/uL   Absolute Monocytes 435 200 - 950 cells/uL   Eosinophils Absolute 122 15 - 500 cells/uL   Basophils Absolute 61 0 - 200 cells/uL   Neutrophils Relative % 28.4 %   Total Lymphocyte 53.4 %   Monocytes Relative 12.8 %   Eosinophils Relative 3.6 %   Basophils Relative 1.8 %  Lipid panel     Status: Abnormal   Collection Time: 06/17/19  9:00 AM  Result Value Ref Range   Cholesterol 179 <200 mg/dL   HDL 45 (L) > OR = 50 mg/dL  Triglycerides 64 <150 mg/dL   LDL Cholesterol (Calc) 118 (H) mg/dL (calc)    Comment: Reference range: <100 . Desirable range <100 mg/dL for primary prevention;   <70 mg/dL for patients with CHD or diabetic patients  with > or = 2 CHD risk factors. Marland Kitchen LDL-C is now calculated using the Martin-Hopkins  calculation, which is a validated novel method providing  better accuracy than the Friedewald equation in the  estimation of LDL-C.  Cresenciano Genre et al. Annamaria Helling. 2956;213(08): 2061-2068   (http://education.[facility].com/faq/FAQ164)    Total CHOL/HDL Ratio 4.0 <5.0 (calc)   Non-HDL Cholesterol (Calc) 134 (H) <130 mg/dL (calc)    Comment: For patients with diabetes plus 1 major ASCVD risk  factor, treating to a non-HDL-C goal of <100 mg/dL  (LDL-C of <70 mg/dL) is considered a therapeutic  option.   TSH     Status: None   Collection Time: 06/17/19  9:00 AM  Result Value Ref Range   TSH 0.59 0.40 - 4.50 mIU/L  Hemoglobin A1c     Status: Abnormal   Collection Time: 06/17/19  9:00 AM  Result Value Ref Range   Hgb A1c MFr Bld 5.9 (H) <5.7 % of total Hgb    Comment: For someone without known diabetes, a hemoglobin  A1c value between 5.7% and 6.4% is consistent with prediabetes and should be confirmed with a  follow-up test. . For someone with known diabetes, a value <7% indicates that their diabetes is well controlled. A1c targets should be individualized based on duration of diabetes, age, comorbid conditions, and other considerations. . This assay result is consistent with an increased risk of diabetes. . Currently, no consensus exists regarding use of hemoglobin A1c for diagnosis of diabetes for children. .    Mean Plasma Glucose 123 (calc)   eAG (mmol/L) 6.8 (calc)  SARS CORONAVIRUS 2 (TAT 6-24 HRS) Nasopharyngeal Nasopharyngeal Swab     Status: None   Collection Time: 07/25/19 12:29 PM   Specimen: Nasopharyngeal Swab  Result Value Ref Range   SARS Coronavirus 2 NEGATIVE NEGATIVE    Comment: (NOTE) SARS-CoV-2 target nucleic acids are NOT DETECTED. The SARS-CoV-2 RNA is generally detectable in upper and lower respiratory specimens during the acute phase of infection. Negative results do not preclude SARS-CoV-2 infection, do not rule out co-infections with other pathogens, and should not be used as the sole basis for treatment or other patient management decisions. Negative results must be combined with clinical observations, patient history, and  epidemiological information. The expected result is Negative. Fact Sheet for Patients: SugarRoll.be Fact Sheet for Healthcare Providers: https://www.woods-mathews.com/ This test is not yet approved or cleared by the [place] FDA and  has been authorized for detection and/or diagnosis of SARS-CoV-2 by FDA under an Emergency Use Authorization (EUA). This EUA will remain  in effect (meaning this test can be used) for the duration of the COVID-19 declaration under Section 56 4(b)(1) of the Act, 21 U.S.C. section 360bbb-3(b)(1), unless the authorization is terminated or revoked sooner. Performed at [facility], [place] [address] [place], [place] [address]       PHQ2/9: Depression screen [facility] 2/9 08/17/2019 08/05/2019 06/27/2019 01/11/2019 09/30/2018  Decreased Interest 2 0 0 0 0  Down, Depressed, Hopeless 2 1 2  0 0  PHQ - 2 Score 4 1 2  0 0  Altered sleeping 2 0 1 1 0  Tired, decreased energy 2 0 0 0 0  Change in appetite 2 0 0 0 0  Feeling bad  or failure about yourself  0 0 0 0 0  Trouble concentrating 0 0 0 0 0  Moving slowly or fidgety/restless 0 0 0 0 0  Suicidal thoughts 0 0 0 0 0  PHQ-9 Score 10 1 3 1  0  Difficult doing work/chores Not difficult at all Not difficult at all Somewhat difficult Not difficult at all Not difficult at all  Some recent data might be hidden    phq 9 is positive   Fall Risk: Fall Risk  08/05/2019 06/27/2019 01/11/2019 09/30/2018 07/30/2018  Falls in the past year? 0 0 0 0 No  Number falls in past yr: 0 0 0 0 -  Injury with Fall? 0 0 0 0 -  Risk for fall due to : - - - - -  Risk for fall due to: Comment - - - - -  Follow up Falls prevention discussed - - - -     Functional Status Survey: Is the patient deaf or have difficulty hearing?: No Does the patient have difficulty seeing, even when wearing glasses/contacts?: No Does the patient have difficulty concentrating, remembering, or making decisions?:  No Does the patient have difficulty walking or climbing stairs?: No Does the patient have difficulty dressing or bathing?: No Does the patient have difficulty doing errands alone such as visiting a doctor's office or shopping?: No    Assessment & Plan  1. Moderate episode of recurrent major depressive disorder (HCC)  She is not sure what reaction she had with citalopram but willing to try zoloft and will let me know if she develops any side effects  - sertraline (ZOLOFT) 50 MG tablet; Take 1 tablet (50 mg total) by mouth daily.  Dispense: 30 tablet; Refill: 1

## 2019-08-24 ENCOUNTER — Telehealth: Payer: Self-pay | Admitting: Family Medicine

## 2019-08-24 ENCOUNTER — Other Ambulatory Visit: Payer: Self-pay

## 2019-08-24 ENCOUNTER — Other Ambulatory Visit: Payer: Self-pay | Admitting: Family Medicine

## 2019-08-24 DIAGNOSIS — Z79890 Hormone replacement therapy (postmenopausal): Secondary | ICD-10-CM | POA: Diagnosis not present

## 2019-08-24 DIAGNOSIS — Z8349 Family history of other endocrine, nutritional and metabolic diseases: Secondary | ICD-10-CM | POA: Diagnosis not present

## 2019-08-24 DIAGNOSIS — M4120 Other idiopathic scoliosis, site unspecified: Secondary | ICD-10-CM | POA: Diagnosis not present

## 2019-08-24 DIAGNOSIS — K219 Gastro-esophageal reflux disease without esophagitis: Secondary | ICD-10-CM | POA: Diagnosis present

## 2019-08-24 DIAGNOSIS — Z886 Allergy status to analgesic agent status: Secondary | ICD-10-CM | POA: Diagnosis not present

## 2019-08-24 DIAGNOSIS — E039 Hypothyroidism, unspecified: Secondary | ICD-10-CM | POA: Diagnosis present

## 2019-08-24 DIAGNOSIS — E785 Hyperlipidemia, unspecified: Secondary | ICD-10-CM | POA: Diagnosis present

## 2019-08-24 DIAGNOSIS — Z20828 Contact with and (suspected) exposure to other viral communicable diseases: Secondary | ICD-10-CM | POA: Diagnosis present

## 2019-08-24 DIAGNOSIS — Z66 Do not resuscitate: Secondary | ICD-10-CM | POA: Diagnosis present

## 2019-08-24 DIAGNOSIS — R002 Palpitations: Secondary | ICD-10-CM | POA: Diagnosis not present

## 2019-08-24 DIAGNOSIS — Z803 Family history of malignant neoplasm of breast: Secondary | ICD-10-CM

## 2019-08-24 DIAGNOSIS — I10 Essential (primary) hypertension: Secondary | ICD-10-CM | POA: Diagnosis not present

## 2019-08-24 DIAGNOSIS — Z86718 Personal history of other venous thrombosis and embolism: Secondary | ICD-10-CM

## 2019-08-24 DIAGNOSIS — Z8249 Family history of ischemic heart disease and other diseases of the circulatory system: Secondary | ICD-10-CM | POA: Diagnosis not present

## 2019-08-24 DIAGNOSIS — R000 Tachycardia, unspecified: Secondary | ICD-10-CM | POA: Diagnosis not present

## 2019-08-24 DIAGNOSIS — Z03818 Encounter for observation for suspected exposure to other biological agents ruled out: Secondary | ICD-10-CM | POA: Diagnosis not present

## 2019-08-24 DIAGNOSIS — Z880 Allergy status to penicillin: Secondary | ICD-10-CM | POA: Diagnosis not present

## 2019-08-24 DIAGNOSIS — Z841 Family history of disorders of kidney and ureter: Secondary | ICD-10-CM | POA: Diagnosis not present

## 2019-08-24 DIAGNOSIS — F329 Major depressive disorder, single episode, unspecified: Secondary | ICD-10-CM | POA: Diagnosis present

## 2019-08-24 DIAGNOSIS — Z885 Allergy status to narcotic agent status: Secondary | ICD-10-CM | POA: Diagnosis not present

## 2019-08-24 DIAGNOSIS — I440 Atrioventricular block, first degree: Secondary | ICD-10-CM | POA: Diagnosis present

## 2019-08-24 DIAGNOSIS — I480 Paroxysmal atrial fibrillation: Principal | ICD-10-CM | POA: Diagnosis present

## 2019-08-24 DIAGNOSIS — Z79899 Other long term (current) drug therapy: Secondary | ICD-10-CM | POA: Diagnosis not present

## 2019-08-24 DIAGNOSIS — Z833 Family history of diabetes mellitus: Secondary | ICD-10-CM | POA: Diagnosis not present

## 2019-08-24 DIAGNOSIS — I499 Cardiac arrhythmia, unspecified: Secondary | ICD-10-CM | POA: Diagnosis not present

## 2019-08-24 LAB — COMPREHENSIVE METABOLIC PANEL
ALT: 24 U/L (ref 0–44)
AST: 26 U/L (ref 15–41)
Albumin: 4.3 g/dL (ref 3.5–5.0)
Alkaline Phosphatase: 76 U/L (ref 38–126)
Anion gap: 10 (ref 5–15)
BUN: 10 mg/dL (ref 8–23)
CO2: 29 mmol/L (ref 22–32)
Calcium: 9.4 mg/dL (ref 8.9–10.3)
Chloride: 99 mmol/L (ref 98–111)
Creatinine, Ser: 0.6 mg/dL (ref 0.44–1.00)
GFR calc Af Amer: >60 mL/min (ref >60)
GFR calc non Af Amer: >60 mL/min (ref >60)
Glucose, Bld: 109 mg/dL — ABNORMAL HIGH (ref 70–99)
Potassium: 4.3 mmol/L (ref 3.5–5.1)
Sodium: 138 mmol/L (ref 135–145)
Total Bilirubin: 0.6 mg/dL (ref 0.3–1.2)
Total Protein: 7.9 g/dL (ref 6.5–8.1)

## 2019-08-24 LAB — TROPONIN I (HIGH SENSITIVITY): Troponin I (High Sensitivity): 8 ng/L (ref <18)

## 2019-08-24 LAB — URINALYSIS, COMPLETE (UACMP) WITH MICROSCOPIC
Bacteria, UA: NONE SEEN
Bilirubin Urine: NEGATIVE
Glucose, UA: NEGATIVE mg/dL
Ketones, ur: NEGATIVE mg/dL
Nitrite: NEGATIVE
Protein, ur: NEGATIVE mg/dL
Specific Gravity, Urine: 1.003 — ABNORMAL LOW (ref 1.005–1.030)
pH: 5 (ref 5.0–8.0)

## 2019-08-24 LAB — CBC
HCT: 38.1 % (ref 36.0–46.0)
Hemoglobin: 12.6 g/dL (ref 12.0–15.0)
MCH: 29 pg (ref 26.0–34.0)
MCHC: 33.1 g/dL (ref 30.0–36.0)
MCV: 87.6 fL (ref 80.0–100.0)
Platelets: 209 10*3/uL (ref 150–400)
RBC: 4.35 MIL/uL (ref 3.87–5.11)
RDW: 15 % (ref 11.5–15.5)
WBC: 6 10*3/uL (ref 4.0–10.5)
nRBC: 0 % (ref 0.0–0.2)

## 2019-08-24 MED ORDER — ARIPIPRAZOLE 2 MG PO TABS: 2.0000 mg | ORAL_TABLET | Freq: Every day | ORAL | 0 refills | Status: DC

## 2019-08-24 NOTE — ED Triage Notes (Signed)
Pt was started on zoloft on Monday, and states has not been able to sleep since. States does feel like heart is fluttering and feels nervous and jittery.

## 2019-08-24 NOTE — Telephone Encounter (Signed)
Patient states she does not want to take another pill and the Abilify is $40 a month. She said thank you for trying but she politely declines for the time being.

## 2019-08-24 NOTE — Telephone Encounter (Signed)
Pt states she took the sertraline (ZOLOFT) 50 MG 1/2 tablet only one day, and she has been awake for 2 nights with no sleep. She thinks this med has caused her not to sleep, and she is not going to take any more.  Would like to know what the next step is to be?    She would like call back as soon as you can.

## 2019-08-25 ENCOUNTER — Inpatient Hospital Stay
Admission: EM | Admit: 2019-08-25 | Discharge: 2019-08-26 | DRG: 310 | Disposition: A | Discharge status: Home / Self Care | Payer: Medicare HMO | Attending: Internal Medicine | Admitting: Internal Medicine

## 2019-08-25 DIAGNOSIS — F329 Major depressive disorder, single episode, unspecified: Secondary | ICD-10-CM | POA: Diagnosis present

## 2019-08-25 DIAGNOSIS — I440 Atrioventricular block, first degree: Secondary | ICD-10-CM | POA: Diagnosis present

## 2019-08-25 DIAGNOSIS — Z79899 Other long term (current) drug therapy: Secondary | ICD-10-CM | POA: Diagnosis not present

## 2019-08-25 DIAGNOSIS — Z86718 Personal history of other venous thrombosis and embolism: Secondary | ICD-10-CM | POA: Diagnosis not present

## 2019-08-25 DIAGNOSIS — Z8349 Family history of other endocrine, nutritional and metabolic diseases: Secondary | ICD-10-CM | POA: Diagnosis not present

## 2019-08-25 DIAGNOSIS — Z66 Do not resuscitate: Secondary | ICD-10-CM | POA: Diagnosis present

## 2019-08-25 DIAGNOSIS — Z833 Family history of diabetes mellitus: Secondary | ICD-10-CM | POA: Diagnosis not present

## 2019-08-25 DIAGNOSIS — I10 Essential (primary) hypertension: Secondary | ICD-10-CM | POA: Diagnosis present

## 2019-08-25 DIAGNOSIS — Z20828 Contact with and (suspected) exposure to other viral communicable diseases: Secondary | ICD-10-CM | POA: Diagnosis present

## 2019-08-25 DIAGNOSIS — E039 Hypothyroidism, unspecified: Secondary | ICD-10-CM | POA: Diagnosis present

## 2019-08-25 DIAGNOSIS — Z8249 Family history of ischemic heart disease and other diseases of the circulatory system: Secondary | ICD-10-CM | POA: Diagnosis not present

## 2019-08-25 DIAGNOSIS — Z841 Family history of disorders of kidney and ureter: Secondary | ICD-10-CM | POA: Diagnosis not present

## 2019-08-25 DIAGNOSIS — E785 Hyperlipidemia, unspecified: Secondary | ICD-10-CM | POA: Diagnosis present

## 2019-08-25 DIAGNOSIS — Z885 Allergy status to narcotic agent status: Secondary | ICD-10-CM | POA: Diagnosis not present

## 2019-08-25 DIAGNOSIS — Z79890 Hormone replacement therapy (postmenopausal): Secondary | ICD-10-CM | POA: Diagnosis not present

## 2019-08-25 DIAGNOSIS — I480 Paroxysmal atrial fibrillation: Secondary | ICD-10-CM | POA: Diagnosis present

## 2019-08-25 DIAGNOSIS — Z880 Allergy status to penicillin: Secondary | ICD-10-CM | POA: Diagnosis not present

## 2019-08-25 DIAGNOSIS — Z803 Family history of malignant neoplasm of breast: Secondary | ICD-10-CM | POA: Diagnosis not present

## 2019-08-25 DIAGNOSIS — I499 Cardiac arrhythmia, unspecified: Secondary | ICD-10-CM | POA: Diagnosis present

## 2019-08-25 DIAGNOSIS — K219 Gastro-esophageal reflux disease without esophagitis: Secondary | ICD-10-CM | POA: Diagnosis present

## 2019-08-25 DIAGNOSIS — M4120 Other idiopathic scoliosis, site unspecified: Secondary | ICD-10-CM | POA: Diagnosis present

## 2019-08-25 DIAGNOSIS — Z886 Allergy status to analgesic agent status: Secondary | ICD-10-CM | POA: Diagnosis not present

## 2019-08-25 LAB — MAGNESIUM: Magnesium: 2.6 mg/dL — ABNORMAL HIGH (ref 1.7–2.4)

## 2019-08-25 LAB — TSH: TSH: 1.313 u[IU]/mL (ref 0.350–4.500)

## 2019-08-25 MED ORDER — METOPROLOL TARTRATE 25 MG PO TABS
12.5000 mg | ORAL_TABLET | Freq: Two times a day (BID) | ORAL | Status: DC
  Administered 2019-08-25 – 2019-08-26 (×3): 12.5 mg via ORAL
  Filled 2019-08-25 (×3): qty 1

## 2019-08-25 MED ORDER — ACETAMINOPHEN 650 MG RE SUPP: 650.0000 mg | Freq: Four times a day (QID) | RECTAL | Status: DC | PRN

## 2019-08-25 MED ORDER — DOCUSATE SODIUM 100 MG PO CAPS
100.0000 mg | ORAL_CAPSULE | Freq: Two times a day (BID) | ORAL | Status: DC
  Administered 2019-08-25 – 2019-08-26 (×2): 100 mg via ORAL
  Filled 2019-08-25 (×3): qty 1

## 2019-08-25 MED ORDER — SODIUM CHLORIDE 0.9 % IV BOLUS
1000.0000 mL | Freq: Once | INTRAVENOUS | Status: AC
  Administered 2019-08-25: 1000 mL via INTRAVENOUS

## 2019-08-25 MED ORDER — ACETAMINOPHEN 325 MG PO TABS: 650.0000 mg | ORAL_TABLET | Freq: Four times a day (QID) | ORAL | Status: DC | PRN

## 2019-08-25 MED ORDER — ENOXAPARIN SODIUM 40 MG/0.4ML ~~LOC~~ SOLN
40.0000 mg | SUBCUTANEOUS | Status: DC
  Administered 2019-08-25 – 2019-08-26 (×2): 40 mg via SUBCUTANEOUS
  Filled 2019-08-25 (×2): qty 0.4

## 2019-08-25 MED ORDER — VITAMIN C 500 MG PO TABS
1000.0000 mg | ORAL_TABLET | Freq: Every day | ORAL | Status: DC
  Administered 2019-08-25 – 2019-08-26 (×2): 1000 mg via ORAL
  Filled 2019-08-25 (×2): qty 2

## 2019-08-25 MED ORDER — ONDANSETRON HCL 4 MG/2ML IJ SOLN: 4.0000 mg | Freq: Four times a day (QID) | INTRAMUSCULAR | Status: DC | PRN

## 2019-08-25 MED ORDER — ONDANSETRON HCL 4 MG PO TABS: 4.0000 mg | ORAL_TABLET | Freq: Four times a day (QID) | ORAL | Status: DC | PRN

## 2019-08-25 MED ORDER — LEVOTHYROXINE SODIUM 88 MCG PO TABS
88.0000 ug | ORAL_TABLET | Freq: Every day | ORAL | Status: DC
  Administered 2019-08-25 – 2019-08-26 (×2): 88 ug via ORAL
  Filled 2019-08-25 (×2): qty 1

## 2019-08-25 MED ORDER — FAMOTIDINE 20 MG PO TABS
40.0000 mg | ORAL_TABLET | Freq: Every day | ORAL | Status: DC
  Administered 2019-08-25 – 2019-08-26 (×2): 40 mg via ORAL
  Filled 2019-08-25 (×2): qty 2

## 2019-08-25 MED ORDER — FLUTICASONE PROPIONATE 50 MCG/ACT NA SUSP
1.0000 | Freq: Every day | NASAL | Status: DC
  Filled 2019-08-25: qty 16

## 2019-08-25 MED ORDER — ESTROGENS, CONJUGATED 0.625 MG/GM VA CREA
1.0000 | TOPICAL_CREAM | Freq: Every day | VAGINAL | Status: DC
  Filled 2019-08-25: qty 30

## 2019-08-25 NOTE — H&P (Signed)
Maureen [name] is an 73 y.o. female.   Chief Complaint: Palpitations HPI: The patient with past medical history of hypothyroidism: Depression and hypertension presents to the emergency department complaining of palpitations.  The patient admits to feeling nervous and jittery.  She also admits to not sleeping well since starting Zoloft 2 days ago.  She denies chest pain in the emergency department episodes of junctional rhythm as well as sporadic atrial fibrillation and nonsustained ventricular tachycardia were seen on telemetry which prompted the emergency department staff to the hospitalist service for admission.  Past Medical History:  Diagnosis Date  . Allergy   . Corn of toe   . First degree AV block   . GERD (gastroesophageal reflux disease)   . Hammer toe of right foot   . Hyperglycemia   . Hyperlipidemia   . Hypertension   . Loss of appetite   . Prolapse of urethra   . Scoliosis (and kyphoscoliosis), idiopathic   . Thyroid disease   . Tinnitus of both ears   . Vitreous floaters of right eye     Past Surgical History:  Procedure Laterality Date  . ABDOMINAL HYSTERECTOMY  1989  . APPENDECTOMY  1989  . BREAST EXCISIONAL BIOPSY Bilateral    multiple biopsies negative  . BREAST SURGERY     Several  . COLONOSCOPY WITH PROPOFOL N/A 07/28/2019   Procedure: COLONOSCOPY WITH PROPOFOL;  Surgeon: Toney ReilVanga, Rohini Reddy, MD;  Location: [facility] ENDOSCOPY;  Service: Gastroenterology;  Laterality: N/A;  . TUMOR EXCISION  1989   same time as hysterectomy    Family History  Problem Relation Age of Onset  . Diabetes Father 6976       DM complications  . Hypertension Mother 7179       CKD  . Thyroid disease Mother   . Heart failure Mother   . Kidney disease Mother   . Breast cancer Sister [id number]  . Kidney disease Other   . Breast cancer Maternal Aunt   . Breast cancer Cousin   . Anuerysm Son 51  . Diabetes Son    Social History:  reports that she has never smoked. She has never used  smokeless tobacco. She reports that she does not drink alcohol or use drugs.  Allergies:  Allergies  Allergen Reactions  . Aspirin     Tinnitus  . Citalopram Other (See Comments)  . Codeine Nausea And Vomiting  . Penicillins Rash    Has patient had a PCN reaction causing immediate rash, facial/tongue/throat swelling, SOB or lightheadedness with hypotension: Yes Has patient had a PCN reaction causing severe rash involving mucus membranes or skin necrosis: No Has patient had a PCN reaction that required hospitalization No Has patient had a PCN reaction occurring within the last 10 years: No If all of the above answers are "NO", then may proceed with Cephalosporin use.    Prior to Admission medications   Medication Sig Start Date End Date Taking? Authorizing Provider  Ascorbic Acid (VITAMIN C) 1000 MG tablet Take 1,000 mg by mouth daily.   Yes [provider]  conjugated estrogens (PREMARIN) vaginal cream Place 1 Applicatorful vaginally daily. 01/11/19  Yes Sowles, Danna HeftyKrichna, MD  famotidine (PEPCID) 40 MG tablet Take 1 tablet (40 mg total) by mouth daily. 01/11/19  Yes Sowles, Danna HeftyKrichna, MD  fluticasone (FLONASE) 50 MCG/ACT nasal spray USE 1 SPRAY IN EACH NOSTRIL ONCE DAILY 04/18/19  Yes Sowles, Danna HeftyKrichna, MD  Garlic 1000 MG CAPS Take 1 capsule by mouth daily.  Yes [provider]  Iron-Vitamins (GERITOL COMPLETE PO) Take 1 tablet by mouth daily.   Yes [provider]  levothyroxine (SYNTHROID) 88 MCG tablet Take 1 tablet (88 mcg total) by mouth daily before breakfast. 06/27/19  Yes Sowles, Danna Hefty, MD  sertraline (ZOLOFT) 50 MG tablet Take 50 mg by mouth daily.   Yes [provider]  triamcinolone cream (KENALOG) 0.1 % Apply 1 application topically 2 (two) times daily. 01/11/19  Yes Sowles, Danna Hefty, MD  TURMERIC CURCUMIN PO Take 550 mg by mouth daily.   Yes [provider]  ARIPiprazole (ABILIFY) 2 MG tablet Take 1 tablet (2 mg total) by mouth  daily. Patient not taking: Reported on 08/25/2019 08/24/19   Alba Cory, MD     Results for orders placed or performed during the hospital encounter of 08/25/19 (from the past 48 hour(s))  CBC     Status: None   Collection Time: 08/24/19 11:08 PM  Result Value Ref Range   WBC 6.0 4.0 - 10.5 K/uL   RBC 4.35 3.87 - 5.11 MIL/uL   Hemoglobin 12.6 12.0 - 15.0 g/dL   HCT 40.9 81.1 - 91.4 %   MCV 87.6 80.0 - 100.0 fL   MCH 29.0 26.0 - 34.0 pg   MCHC 33.1 30.0 - 36.0 g/dL   RDW 78.2 95.6 - 21.3 %   Platelets 209 150 - 400 K/uL   nRBC 0.0 0.0 - 0.2 %    Comment: Performed at [facility], [address] [place], [place] [address]  Comprehensive metabolic panel     Status: Abnormal   Collection Time: 08/24/19 11:08 PM  Result Value Ref Range   Sodium 138 135 - 145 mmol/L   Potassium 4.3 3.5 - 5.1 mmol/L   Chloride 99 98 - 111 mmol/L   CO2 29 22 - 32 mmol/L   Glucose, Bld 109 (H) 70 - 99 mg/dL   BUN 10 8 - 23 mg/dL   Creatinine, Ser 8.46 0.44 - 1.00 mg/dL   Calcium 9.4 8.9 - 96.2 mg/dL   Total Protein 7.9 6.5 - 8.1 g/dL   Albumin 4.3 3.5 - 5.0 g/dL   AST 26 15 - 41 U/L   ALT 24 0 - 44 U/L   Alkaline Phosphatase 76 38 - 126 U/L   Total Bilirubin 0.6 0.3 - 1.2 mg/dL   GFR calc non Af Amer >60 >60 mL/min   GFR calc Af Amer >60 >60 mL/min   Anion gap 10 5 - 15    Comment: Performed at [facility], [address], [place], [place] [address]  Troponin I (High Sensitivity)     Status: None   Collection Time: 08/24/19 11:08 PM  Result Value Ref Range   Troponin I (High Sensitivity) 8 <18 ng/L    Comment: (NOTE) Elevated high sensitivity troponin I (hsTnI) values and significant  changes across serial measurements may suggest ACS but many other  chronic and acute conditions are known to elevate hsTnI results.  Refer to the "Links" section for chest pain algorithms and additional  guidance. Performed at [facility], [address]  [place], [place] [address]   Urinalysis, Complete w Microscopic     Status: Abnormal   Collection Time: 08/24/19 11:08 PM  Result Value Ref Range   Color, Urine COLORLESS (A) YELLOW   APPearance CLEAR (A) CLEAR   Specific Gravity, Urine 1.003 (L) 1.005 - 1.030   pH 5.0 5.0 - 8.0   Glucose, UA NEGATIVE  NEGATIVE mg/dL   Hgb urine dipstick SMALL (A) NEGATIVE   Bilirubin Urine NEGATIVE NEGATIVE   Ketones, ur NEGATIVE NEGATIVE mg/dL   Protein, ur NEGATIVE NEGATIVE mg/dL   Nitrite NEGATIVE NEGATIVE   Leukocytes,Ua MODERATE (A) NEGATIVE   RBC / HPF 0-5 0 - 5 RBC/hpf   WBC, UA 0-5 0 - 5 WBC/hpf   Bacteria, UA NONE SEEN NONE SEEN   Squamous Epithelial / LPF 0-5 0 - 5    Comment: Performed at [facility], [address], [place], [place] [address]  Magnesium     Status: Abnormal   Collection Time: 08/24/19 11:08 PM  Result Value Ref Range   Magnesium 2.6 (H) 1.7 - 2.4 mg/dL    Comment: Performed at [facility], [address] [place],  [address]   No results found.  Review of Systems  Constitutional: Negative for chills and fever.  HENT: Negative for sore throat and tinnitus.   Eyes: Negative for blurred vision and redness.  Respiratory: Negative for cough and shortness of breath.   Cardiovascular: Negative for chest pain, palpitations, orthopnea and PND.  Gastrointestinal: Negative for abdominal pain, diarrhea, nausea and vomiting.  Genitourinary: Negative for dysuria, frequency and urgency.  Musculoskeletal: Negative for joint pain and myalgias.  Skin: Negative for rash.       No lesions  Neurological: Negative for speech change, focal weakness and weakness.  Endo/Heme/Allergies: Does not bruise/bleed easily.       No temperature intolerance  Psychiatric/Behavioral: Negative for depression and suicidal ideas.    Blood pressure (!) 165/109, pulse (!) 114, temperature 97.8 F (36.6 C), temperature source Oral, resp. rate 20, height 5\' 4"  (1.626 m), weight  57.2 kg. Physical Exam  Vitals reviewed. Constitutional: She is oriented to person, place, and time. She appears well-developed and well-nourished. No distress.  HENT:  Head: Normocephalic and atraumatic.  Mouth/Throat: Oropharynx is clear and moist.  Eyes: Pupils are equal, round, and reactive to light. Conjunctivae and EOM are normal. No scleral icterus.  Neck: Normal range of motion. Neck supple. No JVD present. No tracheal deviation present. No thyromegaly present.  Cardiovascular: Normal rate, regular rhythm and normal heart sounds. Exam reveals no gallop and no friction rub.  No murmur heard. Respiratory: Effort normal and breath sounds normal.  GI: Soft. Bowel sounds are normal. She exhibits no distension. There is no abdominal tenderness.  Genitourinary:    Genitourinary Comments: Deferred   Musculoskeletal: Normal range of motion.        General: No edema.  Lymphadenopathy:    She has no cervical adenopathy.  Neurological: She is alert and oriented to person, place, and time. No cranial nerve deficit. She exhibits normal muscle tone.  Skin: Skin is warm and dry. No rash noted. No erythema.  Psychiatric: She has a normal mood and affect. Her behavior is normal. Judgment and thought content normal.     Assessment/Plan This is a 73 year old female admitted for arrhythmias. 1.  Arrhythmias: Continue to monitor telemetry.  Electrolytes are within normal limits.  EKG is normal.  The patient does have some ectopy on telemetry.  Consult cardiology for further guidance. 2.  Hypothyroidism: Check TSH; continue Synthroid 3.  Depression: The patient meets to being under a lot of stress lately and her outpatient physician had started Zoloft.  The patient also has Abilify on her medication list.  I have held both of these medications for now. 4.  DVT prophylaxis: Lovenox 5.  GI prophylaxis: None The  patient is a DNR.  Time spent on admission orders and patient care approximately 45  minutes  Arnaldo Natal, MD 08/25/2019, 6:51 AM

## 2019-08-25 NOTE — Consult Note (Signed)
Reason for Consult: Palpitations tachycardia Referring Physician: Dr. Joycelyn Rua hospitalist  Maureen [name] is an 73 y.o. female.  HPI: 73 year old female history of thyroid disease depression hypertension presents emergency room with palpitation.  Patient admits to having nervous jittery feeling in her chest.  Patient admitted to not sleeping well since starting Zoloft.  Patient denies any chest pain she has had junctional episodes sporadically and has sporadic atrial fibrillation nonsustained VT in the past seen on telemetry she was admitted for further evaluation and Zoloft was held.  Past Medical History:  Diagnosis Date  . Allergy   . Corn of toe   . First degree AV block   . GERD (gastroesophageal reflux disease)   . Hammer toe of right foot   . Hyperglycemia   . Hyperlipidemia   . Hypertension   . Loss of appetite   . Prolapse of urethra   . Scoliosis (and kyphoscoliosis), idiopathic   . Thyroid disease   . Tinnitus of both ears   . Vitreous floaters of right eye     Past Surgical History:  Procedure Laterality Date  . ABDOMINAL HYSTERECTOMY  1989  . APPENDECTOMY  1989  . BREAST EXCISIONAL BIOPSY Bilateral    multiple biopsies negative  . BREAST SURGERY     Several  . COLONOSCOPY WITH PROPOFOL N/A 07/28/2019   Procedure: COLONOSCOPY WITH PROPOFOL;  Surgeon: Toney Reil, MD;  Location: [facility] ENDOSCOPY;  Service: Gastroenterology;  Laterality: N/A;  . TUMOR EXCISION  1989   same time as hysterectomy    Family History  Problem Relation Age of Onset  . Diabetes Father 42       DM complications  . Hypertension Mother 91       CKD  . Thyroid disease Mother   . Heart failure Mother   . Kidney disease Mother   . Breast cancer Sister 55  . Kidney disease Other   . Breast cancer Maternal Aunt   . Breast cancer Cousin   . Anuerysm Son 51  . Diabetes Son     Social History:  reports that she has never smoked. She has never used smokeless tobacco. She  reports that she does not drink alcohol or use drugs.  Allergies:  Allergies  Allergen Reactions  . Aspirin     Tinnitus  . Citalopram Other (See Comments)  . Codeine Nausea And Vomiting  . Penicillins Rash    Has patient had a PCN reaction causing immediate rash, facial/tongue/throat swelling, SOB or lightheadedness with hypotension: Yes Has patient had a PCN reaction causing severe rash involving mucus membranes or skin necrosis: No Has patient had a PCN reaction that required hospitalization No Has patient had a PCN reaction occurring within the last 10 years: No If all of the above answers are "NO", then may proceed with Cephalosporin use.    Medications: I have reviewed the patient's current medications.  Results for orders placed or performed during the hospital encounter of 08/25/19 (from the past 48 hour(s))  CBC     Status: None   Collection Time: 08/24/19 11:08 PM  Result Value Ref Range   WBC 6.0 4.0 - 10.5 K/uL   RBC 4.35 3.87 - 5.11 MIL/uL   Hemoglobin 12.6 12.0 - 15.0 g/dL   HCT 12.8 78.6 - 76.7 %   MCV 87.6 80.0 - 100.0 fL   MCH 29.0 26.0 - 34.0 pg   MCHC 33.1 30.0 - 36.0 g/dL   RDW 20.9 47.0 - 96.2 %  Platelets 209 150 - 400 K/uL   nRBC 0.0 0.0 - 0.2 %    Comment: Performed at [facility], [address] [place], [place] [address]  Comprehensive metabolic panel     Status: Abnormal   Collection Time: 08/24/19 11:08 PM  Result Value Ref Range   Sodium 138 135 - 145 mmol/L   Potassium 4.3 3.5 - 5.1 mmol/L   Chloride 99 98 - 111 mmol/L   CO2 29 22 - 32 mmol/L   Glucose, Bld 109 (H) 70 - 99 mg/dL   BUN 10 8 - 23 mg/dL   Creatinine, Ser 0.60 0.44 - 1.00 mg/dL   Calcium 9.4 8.9 - 10.3 mg/dL   Total Protein 7.9 6.5 - 8.1 g/dL   Albumin 4.3 3.5 - 5.0 g/dL   AST 26 15 - 41 U/L   ALT 24 0 - 44 U/L   Alkaline Phosphatase 76 38 - 126 U/L   Total Bilirubin 0.6 0.3 - 1.2 mg/dL   GFR calc non Af Amer >60 >60 mL/min   GFR calc Af Amer >60 >60 mL/min    Anion gap 10 5 - 15    Comment: Performed at [facility], [place], [place] [address]  Troponin I (High Sensitivity)     Status: None   Collection Time: 08/24/19 11:08 PM  Result Value Ref Range   Troponin I (High Sensitivity) 8 <18 ng/L    Comment: (NOTE) Elevated high sensitivity troponin I (hsTnI) values and significant  changes across serial measurements may suggest ACS but many other  chronic and acute conditions are known to elevate hsTnI results.  Refer to the "Links" section for chest pain algorithms and additional  guidance. Performed at [facility], [address] [place], [place] [address]   Urinalysis, Complete w Microscopic     Status: Abnormal   Collection Time: 08/24/19 11:08 PM  Result Value Ref Range   Color, Urine COLORLESS (A) YELLOW   APPearance CLEAR (A) CLEAR   Specific Gravity, Urine 1.003 (L) 1.005 - 1.030   pH 5.0 5.0 - 8.0   Glucose, UA NEGATIVE NEGATIVE mg/dL   Hgb urine dipstick SMALL (A) NEGATIVE   Bilirubin Urine NEGATIVE NEGATIVE   Ketones, ur NEGATIVE NEGATIVE mg/dL   Protein, ur NEGATIVE NEGATIVE mg/dL   Nitrite NEGATIVE NEGATIVE   Leukocytes,Ua MODERATE (A) NEGATIVE   RBC / HPF 0-5 0 - 5 RBC/hpf   WBC, UA 0-5 0 - 5 WBC/hpf   Bacteria, UA NONE SEEN NONE SEEN   Squamous Epithelial / LPF 0-5 0 - 5    Comment: Performed at [facility], [address] [place], [place] [address]  Magnesium     Status: Abnormal   Collection Time: 08/24/19 11:08 PM  Result Value Ref Range   Magnesium 2.6 (H) 1.7 - 2.4 mg/dL    Comment: Performed at [facility], [address] [place], [place] [address]  TSH     Status: None   Collection Time: 08/24/19 11:08 PM  Result Value Ref Range   TSH 1.313 0.350 - 4.500 uIU/mL    Comment: Performed by a 3rd Generation assay with a functional sensitivity of <=0.01 uIU/mL. Performed at [facility], [address] [place],  [address]     No  results found.  Review of Systems  Constitutional: Positive for malaise/fatigue.  HENT: Positive for congestion.   Eyes: Negative.   Respiratory: Negative.   Cardiovascular: Positive for palpitations.  Gastrointestinal: Negative.   Genitourinary: Negative.   Musculoskeletal: Negative.   Skin: Negative.   Neurological: Positive for tingling and weakness.  Endo/Heme/Allergies: Negative.   Psychiatric/Behavioral: Positive for depression.   Blood pressure (!) 147/101, pulse (!) 101, temperature 97.8 F (36.6 C), temperature source Oral, resp. rate 20, height 5\' 4"  (1.626 m), weight 57.2 kg, SpO2 100 %. Physical Exam  Nursing note and vitals reviewed. Constitutional: She is oriented to person, place, and time. She appears well-developed and well-nourished.  HENT:  Head: Normocephalic and atraumatic.  Eyes: Pupils are equal, round, and reactive to light. Conjunctivae and EOM are normal.  Neck: Normal range of motion. Neck supple.  Cardiovascular: Normal rate, regular rhythm and normal heart sounds.  Respiratory: Effort normal and breath sounds normal.  GI: Soft. Bowel sounds are normal.  Musculoskeletal: Normal range of motion.  Neurological: She is alert and oriented to person, place, and time. She has normal reflexes.  Skin: Skin is warm and dry.    Assessment/Plan: Palpitations Tachycardia Depression AFIB Hypertension GERD Thyroid disease Hx of DVT . Plan Agree with admit to tele Rule out myocardial infarction follow-up EKGs and troponin Consider anticoagulation for probable atrial fibrillation Continue thyroid meds Continue pepcid Rec consider antiqarrthymic Sotolol vs Amiodarone Consider therapy for what appears to probably be paroxysmal atrial fibrillation Agree with holding Zoloft and consider different medication   Dwayne D Callwood 08/25/2019, 11:48 AM

## 2019-08-25 NOTE — Plan of Care (Signed)

## 2019-08-25 NOTE — Progress Notes (Signed)
[facility] NAME: Riely Oetken    MR#:  [id number]  DATE OF BIRTH:  Feb 26, 1946  SUBJECTIVE:  CHIEF COMPLAINT: Patient was seen and examined in the ED denies any palpitations or dizzy spells.  Resting comfortably  REVIEW OF SYSTEMS:  CONSTITUTIONAL: No fever, fatigue or weakness.  EYES: No blurred or double vision.  EARS, NOSE, AND THROAT: No tinnitus or ear pain.  RESPIRATORY: No cough, shortness of breath, wheezing or hemoptysis.  CARDIOVASCULAR: No chest pain, orthopnea, edema.  GASTROINTESTINAL: No nausea, vomiting, diarrhea or abdominal pain.  GENITOURINARY: No dysuria, hematuria.  ENDOCRINE: No polyuria, nocturia,  HEMATOLOGY: No anemia, easy bruising or bleeding SKIN: No rash or lesion. MUSCULOSKELETAL: No joint pain or arthritis.   NEUROLOGIC: No tingling, numbness, weakness.  PSYCHIATRY: No anxiety or depression.   DRUG ALLERGIES:   Allergies  Allergen Reactions  . Aspirin     Tinnitus  . Citalopram Other (See Comments)  . Codeine Nausea And Vomiting  . Penicillins Rash    Has patient had a PCN reaction causing immediate rash, facial/tongue/throat swelling, SOB or lightheadedness with hypotension: Yes Has patient had a PCN reaction causing severe rash involving mucus membranes or skin necrosis: No Has patient had a PCN reaction that required hospitalization No Has patient had a PCN reaction occurring within the last 10 years: No If all of the above answers are "NO", then may proceed with Cephalosporin use.    VITALS:  Blood pressure (!) 144/99, pulse 88, temperature 97.8 F (36.6 C), temperature source Oral, resp. rate 20, height 5\' 4"  (1.626 m), weight 57.2 kg, SpO2 98 %.  PHYSICAL EXAMINATION:  GENERAL:  73 y.o.-year-old patient lying in the bed with no acute distress.  EYES: Pupils equal, round, reactive to light and accommodation. No scleral icterus. Extraocular muscles intact.  HEENT: Head  atraumatic, normocephalic. Oropharynx and nasopharynx clear.  NECK:  Supple, no jugular venous distention. No thyroid enlargement, no tenderness.  LUNGS: Normal breath sounds bilaterally, no wheezing, rales,rhonchi or crepitation. No use of accessory muscles of respiration.  CARDIOVASCULAR: S1, S2 normal. No murmurs, rubs, or gallops.  ABDOMEN: Soft, nontender, nondistended. Bowel sounds present.  EXTREMITIES: No pedal edema, cyanosis, or clubbing.  NEUROLOGIC: Cranial nerves II through XII are intact. Muscle strength 5/5 in all extremities. Sensation intact. Gait not checked.  PSYCHIATRIC: The patient is alert and oriented x 3.  SKIN: No obvious rash, lesion, or ulcer.    LABORATORY PANEL:   CBC Recent Labs  Lab 08/24/19 2308  WBC 6.0  HGB 12.6  HCT 38.1  PLT 209   ------------------------------------------------------------------------------------------------------------------  Chemistries  Recent Labs  Lab 08/24/19 2308  NA 138  K 4.3  CL 99  CO2 29  GLUCOSE 109*  BUN 10  CREATININE 0.60  CALCIUM 9.4  MG 2.6*  AST 26  ALT 24  ALKPHOS 76  BILITOT 0.6   ------------------------------------------------------------------------------------------------------------------  Cardiac Enzymes No results for input(s): TROPONINI in the last 168 hours. ------------------------------------------------------------------------------------------------------------------  RADIOLOGY:  No results found.  EKG:   Orders placed or performed during the hospital encounter of 08/25/19  . ED EKG  . ED EKG  . EKG 12-Lead  . EKG 12-Lead  . EKG 12-Lead  . EKG 12-Lead    ASSESSMENT AND PLAN:   This is a 73 year old female admitted for arrhythmias. 1.  Arrhythmias: Continue to monitor telemetry.  Electrolytes are within normal limits.  EKG is normal.  The patient does have some  ectopy on telemetry.  Consult cardiology for further guidance.  Seen by cardiology Dr. Juliann Pares may  consider anticoagulation for possible atrial fibrillation May need to consider antiarrhythmic sotalol versus amiodarone 2.  Hypothyroidism: nml  TSH; continue Synthroid 3.  Depression: The patient meets to being under a lot of stress lately and her outpatient physician had started Zoloft.  The patient also has Abilify on her medication list.  Holding  both of these medications for now.   DVT prophylaxis: Lovenox   GI prophylaxis: None     All the records are reviewed and case discussed with Care Management/Social Workerr. Management plans discussed with the patient, she is  in agreement.  CODE STATUS: DNR   TOTAL TIME TAKING CARE OF THIS PATIENT: 36  minutes.   POSSIBLE D/C IN  DAYS, DEPENDING ON CLINICAL CONDITION.  Note: This dictation was prepared with Dragon dictation along with smaller phrase technology. Any transcriptional errors that result from this process are unintentional.   Ramonita Lab M.D on 08/25/2019 at 2:39 PM  Between 7am to 6pm - Pager - [phone] After 6pm go to [email or web address] - password [email or web address]  [facility]  Office  [phone]  CC: Primary care physician; Alba Cory, MD

## 2019-08-25 NOTE — ED Notes (Signed)
Pt given breakfast box. 

## 2019-08-25 NOTE — ED Notes (Signed)
This RN attempted to establish PIV w/o success.

## 2019-08-25 NOTE — ED Provider Notes (Signed)
[facility] Emergency Department Provider Note  ____________________________________________  Time seen: Approximately 3:59 AM  I have reviewed the triage vital signs and the nursing notes.   HISTORY  Chief Complaint Medication Reaction   HPI Maureen [name] is a 73 y.o. female with history as listed below who presents for evaluation of a possible medication side effect.  Patient reports being in her usual state of health until 2 days ago.  She was started on Zoloft by her PCP.  Several hours after taking the first dose patient started feeling very nervous, jittery and has been having intermittent episodes of heart fluttering sensation.  She has been unable to sleep the last 2 nights.  She only took 1 dose of Zoloft and stopped.  This evening she had yet again another episode of fluttering of her heart. No CP, SOB, dizziness, N/V, fever, cough. She feels better now and back to baseline.   Past Medical History:  Diagnosis Date  . Allergy   . Corn of toe   . First degree AV block   . GERD (gastroesophageal reflux disease)   . Hammer toe of right foot   . Hyperglycemia   . Hyperlipidemia   . Hypertension   . Loss of appetite   . Prolapse of urethra   . Scoliosis (and kyphoscoliosis), idiopathic   . Thyroid disease   . Tinnitus of both ears   . Vitreous floaters of right eye     Patient Active Problem List   Diagnosis Date Noted  . Rectal bleeding   . Low grade squamous intraepith lesion on cytologic smear cervix (lgsil) 08/04/2018  . Chronic venous insufficiency 07/05/2018  . Lymphedema 07/05/2018  . Hypertension 04/02/2018  . Swelling of limb 04/02/2018  . Depression, major, recurrent, mild ([place]) 12/29/2017  . Leukopenia 05/24/2017  . Allergic rhinitis, seasonal 06/29/2015  . Clavus 06/29/2015  . 1st degree AV block 06/29/2015  . Hammer toe 06/29/2015  . H/O: HTN (hypertension) 06/29/2015  . Blood glucose elevated 06/29/2015  . Floater,  vitreous 06/29/2015  . Bilateral tinnitus 06/29/2015  . Hypothyroidism (acquired) 04/02/2015  . Gastroesophageal reflux disease 04/02/2015  . Hammer toe of right foot 04/02/2015  . Scoliosis of thoracic spine 04/02/2015  . Urethral prolapse 04/02/2015  . Corn of toe 04/02/2015  . Hyperlipidemia 04/02/2015  . Varicose veins of both lower extremities 04/02/2015    Past Surgical History:  Procedure Laterality Date  . ABDOMINAL HYSTERECTOMY  1989  . APPENDECTOMY  1989  . BREAST EXCISIONAL BIOPSY Bilateral    multiple biopsies negative  . BREAST SURGERY     Several  . COLONOSCOPY WITH PROPOFOL N/A 07/28/2019   Procedure: COLONOSCOPY WITH PROPOFOL;  Surgeon: Lin Landsman, MD;  Location: [facility] ENDOSCOPY;  Service: Gastroenterology;  Laterality: N/A;  . TUMOR EXCISION  1989   same time as hysterectomy    Prior to Admission medications   Medication Sig Start Date End Date Taking? Authorizing Provider  ARIPiprazole (ABILIFY) 2 MG tablet Take 1 tablet (2 mg total) by mouth daily. 08/24/19   Steele Sizer, MD  Ascorbic Acid (VITAMIN C) 1000 MG tablet Take 1,000 mg by mouth daily.    [provider]  conjugated estrogens (PREMARIN) vaginal cream Place 1 Applicatorful vaginally daily. 01/11/19   Steele Sizer, MD  famotidine (PEPCID) 40 MG tablet Take 1 tablet (40 mg total) by mouth daily. 01/11/19   Steele Sizer, MD  fluticasone (FLONASE) 50 MCG/ACT nasal spray USE 1 SPRAY IN [facility]  NOSTRIL ONCE DAILY 04/18/19   Alba CorySowles, Krichna, MD  Garlic 1000 MG CAPS Take by mouth.    [provider]  Iron-Vitamins (GERITOL COMPLETE PO) Take 1 tablet by mouth daily.    [provider]  levothyroxine (SYNTHROID) 88 MCG tablet Take 1 tablet (88 mcg total) by mouth daily before breakfast. 06/27/19   Carlynn PurlSowles, Danna HeftyKrichna, MD  triamcinolone cream (KENALOG) 0.1 % Apply 1 application topically 2 (two) times daily. 01/11/19   Alba CorySowles, Krichna, MD  TURMERIC CURCUMIN PO Take 550 mg by mouth  daily.    [provider]    Allergies Aspirin, Citalopram, Codeine, and Penicillins  Family History  Problem Relation Age of Onset  . Diabetes Father 4076       DM complications  . Hypertension Mother 7879       CKD  . Thyroid disease Mother   . Heart failure Mother   . Kidney disease Mother   . Breast cancer Sister 753  . Kidney disease Other   . Breast cancer Maternal Aunt   . Breast cancer Cousin   . Anuerysm Son 51  . Diabetes Son     Social History Social History   Tobacco Use  . Smoking status: Never Smoker  . Smokeless tobacco: Never Used  . Tobacco comment: smoking cessation materials not required  Substance Use Topics  . Alcohol use: Never    Alcohol/week: 0.0 standard drinks    Frequency: Never  . Drug use: No    Review of Systems  Constitutional: Negative for fever. Eyes: Negative for visual changes. ENT: Negative for sore throat. Neck: No neck pain  Cardiovascular: Negative for chest pain. + fluttering Respiratory: Negative for shortness of breath. Gastrointestinal: Negative for abdominal pain, vomiting or diarrhea. Genitourinary: Negative for dysuria. Musculoskeletal: Negative for back pain. Skin: Negative for rash. Neurological: Negative for headaches, weakness or numbness. Psych: No SI or HI. + insomnia, jittery, nervous  ____________________________________________   PHYSICAL EXAM:  VITAL SIGNS: ED Triage Vitals [08/24/19 2305]  Enc Vitals Group     BP (!) 165/109     Pulse Rate (!) 114     Resp 20     Temp 97.8 F (36.6 C)     Temp Source Oral     SpO2      Weight 126 lb (57.2 kg)     Height 5\' 4"  (1.626 m)     Head Circumference      Peak Flow      Pain Score 0     Pain Loc      Pain Edu?      Excl. in GC?     Constitutional: Alert and oriented. Well appearing and in no apparent distress. HEENT:      Head: Normocephalic and atraumatic.         Eyes: Conjunctivae are normal. Sclera is non-icteric.        Mouth/Throat: Mucous membranes are moist.       Neck: Supple with no signs of meningismus. Cardiovascular: Tachycardic rate with regular rhythm. No murmurs, gallops, or rubs. 2+ symmetrical distal pulses are present in all extremities. No JVD. Respiratory: Normal respiratory effort. Lungs are clear to auscultation bilaterally. No wheezes, crackles, or rhonchi.  Gastrointestinal: Soft, non tender, and non distended with positive bowel sounds. No rebound or guarding. Musculoskeletal: Nontender with normal range of motion in all extremities. No edema, cyanosis, or erythema of extremities. Neurologic: Normal speech and language. Face is symmetric. Moving all extremities. No gross focal  neurologic deficits are appreciated. Skin: Skin is warm, dry and intact. No rash noted. Psychiatric: Mood and affect are normal. Speech and behavior are normal.  ____________________________________________   LABS (all labs ordered are listed, but only abnormal results are displayed)  Labs Reviewed  COMPREHENSIVE METABOLIC PANEL - Abnormal; Notable for the following components:      Result Value   Glucose, Bld 109 (*)    All other components within normal limits  URINALYSIS, COMPLETE (UACMP) WITH MICROSCOPIC - Abnormal; Notable for the following components:   Color, Urine COLORLESS (*)    APPearance CLEAR (*)    Specific Gravity, Urine 1.003 (*)    Hgb urine dipstick SMALL (*)    Leukocytes,Ua MODERATE (*)    All other components within normal limits  CBC  TROPONIN I (HIGH SENSITIVITY)   ____________________________________________  EKG  ED ECG REPORT I, Nita Sickle, the attending physician, personally viewed and interpreted this ECG.  Accelerated junctional rhythm, rate of 108, normal QTC, left axis deviation, anterior Q waves. New when compared to prior ____________________________________________  RADIOLOGY  none  ____________________________________________   PROCEDURES   Procedure(s) performed: None Procedures Critical Care performed:  None ____________________________________________   INITIAL IMPRESSION / ASSESSMENT AND PLAN / ED COURSE  73 y.o. female with history as listed below who presents for evaluation of a possible medication side effect.  Patient having intermittent episodes of feeling very jittery, anxious, heart palpitations, and insomnia since taking 1 dose of Zoloft 2 days ago.  This is the only dose she took.  She is hemodynamically stable.  Her EKG showing new junctional rhythm.  Her labs show no evidence of electrolyte abnormalities, AKI, dehydration, or anemia.  Troponin is negative.  No signs of dehydration.  Patient monitored for a few hours on telemetry with several different rhythms seen on strip including non sustained short runs of Vtach, afib, and intermittent junctional rhythm. Electrolytes WNL. Will admit to Hospitalist      As part of my medical decision making, I reviewed the following data within the electronic MEDICAL RECORD NUMBER Nursing notes reviewed and incorporated, Labs reviewed , EKG interpreted , Old EKG reviewed, Discussed with admitting physician , Notes from prior ED visits and [place] Controlled Substance Database   Patient was evaluated in Emergency Department today for the symptoms described in the history of present illness. Patient was evaluated in the context of the global COVID-19 pandemic, which necessitated consideration that the patient might be at risk for infection with the SARS-CoV-2 virus that causes COVID-19. Institutional protocols and algorithms that pertain to the evaluation of patients at risk for COVID-19 are in a state of rapid change based on information released by regulatory bodies including the CDC and federal and state organizations. These policies and algorithms were followed during the patient's care in the ED.   ____________________________________________   FINAL CLINICAL IMPRESSION(S) / ED  DIAGNOSES   Final diagnoses:  Cardiac arrhythmia, unspecified cardiac arrhythmia type      NEW MEDICATIONS STARTED DURING THIS VISIT:  ED Discharge Orders    None       Note:  This document was prepared using Dragon voice recognition software and may include unintentional dictation errors.    Nita Sickle, MD 08/25/19 [phone]

## 2019-08-25 NOTE — ED Notes (Signed)
ED Provider Veronse at bedside.

## 2019-08-26 DIAGNOSIS — E039 Hypothyroidism, unspecified: Secondary | ICD-10-CM | POA: Diagnosis not present

## 2019-08-26 DIAGNOSIS — I480 Paroxysmal atrial fibrillation: Secondary | ICD-10-CM | POA: Diagnosis not present

## 2019-08-26 DIAGNOSIS — F329 Major depressive disorder, single episode, unspecified: Secondary | ICD-10-CM | POA: Diagnosis not present

## 2019-08-26 DIAGNOSIS — I499 Cardiac arrhythmia, unspecified: Secondary | ICD-10-CM | POA: Diagnosis not present

## 2019-08-26 LAB — SARS CORONAVIRUS 2 (TAT 6-24 HRS): SARS Coronavirus 2: NEGATIVE

## 2019-08-26 MED ORDER — METOPROLOL TARTRATE 25 MG PO TABS: 12.5000 mg | ORAL_TABLET | Freq: Two times a day (BID) | ORAL | 0 refills | Status: DC

## 2019-08-26 MED ORDER — DOCUSATE SODIUM 100 MG PO CAPS: 100.0000 mg | ORAL_CAPSULE | Freq: Every day | ORAL | 0 refills | Status: DC | PRN

## 2019-08-26 MED ORDER — ACETAMINOPHEN 325 MG PO TABS: 650.0000 mg | ORAL_TABLET | Freq: Four times a day (QID) | ORAL | Status: DC | PRN

## 2019-08-26 MED ORDER — APIXABAN 5 MG PO TABS
5.0000 mg | ORAL_TABLET | Freq: Two times a day (BID) | ORAL | Status: DC
  Administered 2019-08-26: 5 mg via ORAL
  Filled 2019-08-26: qty 1

## 2019-08-26 MED ORDER — APIXABAN 5 MG PO TABS: 5.0000 mg | ORAL_TABLET | Freq: Two times a day (BID) | ORAL | 0 refills | Status: DC

## 2019-08-26 NOTE — Discharge Summary (Signed)
[facility] [place] [facility]   PATIENT NAME: Maureen Ferdinandatricia Yokum    MR#:  [id number]  DATE OF BIRTH:  [birth year]  DATE OF ADMISSION:  08/24/2072 ADMITTING PHYSICIAN: Ramonita LabAruna Harriet Sutphen, MD  DATE OF DISCHARGE:  08/26/19   PRIMARY CARE PHYSICIAN: Alba CorySowles, Krichna, MD    ADMISSION DIAGNOSIS:  Cardiac arrhythmia, unspecified cardiac arrhythmia type [I49.9] Arrhythmia [I49.9]  DISCHARGE DIAGNOSIS:  Active Problems:   Arrhythmia  Paroxysmal atrial fibrillation SECONDARY DIAGNOSIS:   Past Medical History:  Diagnosis Date  . Allergy   . Corn of toe   . First degree AV block   . GERD (gastroesophageal reflux disease)   . Hammer toe of right foot   . Hyperglycemia   . Hyperlipidemia   . Hypertension   . Loss of appetite   . Prolapse of urethra   . Scoliosis (and kyphoscoliosis), idiopathic   . Thyroid disease   . Tinnitus of both ears   . Vitreous floaters of right eye     HOSPITAL COURSE:   HPI: The patient with past medical history of hypothyroidism: Depression and hypertension presents to the emergency department complaining of palpitations.  The patient admits to feeling nervous and jittery.  She also admits to not sleeping well since starting Zoloft 2 days ago.  She denies chest pain in the emergency department episodes of junctional rhythm as well as sporadic atrial fibrillation and nonsustained ventricular tachycardia were seen on telemetry which prompted the emergency department staff to the hospitalist service for admission.  1. Arrhythmias: Paroxysmal atrial fibrillation with history of nonsustained V. Tach  monitored patient on telemetry.  Electrolytes are within normal limits. EKG is normal.  Patient is asymptomatic today   Seen by cardiology Dr. Juliann Paresallwood recommending to start the patient on Eliquis twice a day for anticoagulation , discussed with patient's primary gastroenterologist Dr. Allegra LaiVanga , she is okay to start the patient on Eliquis as  patient is not actively bleeding for the past 1 month.  Risks and benefits discussed with the patient , extra caution advised with Eliquis.  Patient verbalized understanding, she is aware that she has to stop taking Eliquis if she notices any GI bleed including rectal bleeding May need to consider antiarrhythmic sotalol versus amiodarone if no improvement in the near future Okay to discharge patient from cardiology standpoint 2. Hypothyroidism: nml  TSH; continue Synthroid 3. Depression: The patient meets to being under a lot of stress lately and her outpatient physician had started Zoloft. The patient also has Abilify on her medication list but she stopped taking Abilify, follow-up with the primary care physician regarding resuming Zoloft or switching to something else we are holding Zoloft at this time of discharge.  Patient verbalized understanding DISCHARGE CONDITIONS:   fair  CONSULTS OBTAINED:     PROCEDURES  None   DRUG ALLERGIES:   Allergies  Allergen Reactions  . Aspirin     Tinnitus  . Citalopram Other (See Comments)  . Codeine Nausea And Vomiting  . Penicillins Rash    Has patient had a PCN reaction causing immediate rash, facial/tongue/throat swelling, SOB or lightheadedness with hypotension: Yes Has patient had a PCN reaction causing severe rash involving mucus membranes or skin necrosis: No Has patient had a PCN reaction that required hospitalization No Has patient had a PCN reaction occurring within the last 10 years: No If all of the above answers are "NO", then may proceed with Cephalosporin use.    DISCHARGE MEDICATIONS:   Allergies as  of 08/26/2019      Reactions   Aspirin    Tinnitus   Citalopram Other (See Comments)   Codeine Nausea And Vomiting   Penicillins Rash   Has patient had a PCN reaction causing immediate rash, facial/tongue/throat swelling, SOB or lightheadedness with hypotension: Yes Has patient had a PCN reaction causing severe rash  involving mucus membranes or skin necrosis: No Has patient had a PCN reaction that required hospitalization No Has patient had a PCN reaction occurring within the last 10 years: No If all of the above answers are "NO", then may proceed with Cephalosporin use.      Medication List    STOP taking these medications   ARIPiprazole 2 MG tablet Commonly known as: Abilify   sertraline 50 MG tablet Commonly known as: ZOLOFT     TAKE these medications   acetaminophen 325 MG tablet Commonly known as: TYLENOL Take 2 tablets (650 mg total) by mouth every 6 (six) hours as needed for mild pain (or Fever >/= 101).   apixaban 5 MG Tabs tablet Commonly known as: ELIQUIS Take 1 tablet (5 mg total) by mouth 2 (two) times daily.   conjugated estrogens vaginal cream Commonly known as: Premarin Place 1 Applicatorful vaginally daily.   docusate sodium 100 MG capsule Commonly known as: COLACE Take 1 capsule (100 mg total) by mouth daily as needed for mild constipation.   famotidine 40 MG tablet Commonly known as: Pepcid Take 1 tablet (40 mg total) by mouth daily.   fluticasone 50 MCG/ACT nasal spray Commonly known as: FLONASE USE 1 SPRAY IN EACH NOSTRIL ONCE DAILY   Garlic 4166 MG Caps Take 1 capsule by mouth daily.   GERITOL COMPLETE PO Take 1 tablet by mouth daily.   levothyroxine 88 MCG tablet Commonly known as: SYNTHROID Take 1 tablet (88 mcg total) by mouth daily before breakfast.   metoprolol tartrate 25 MG tablet Commonly known as: LOPRESSOR Take 0.5 tablets (12.5 mg total) by mouth 2 (two) times daily.   triamcinolone cream 0.1 % Commonly known as: KENALOG Apply 1 application topically 2 (two) times daily.   TURMERIC CURCUMIN PO Take 550 mg by mouth daily.   vitamin C 1000 MG tablet Take 1,000 mg by mouth daily.        DISCHARGE INSTRUCTIONS:   Follow-up with primary care physician in 3 days Follow-up with cardiology Dr. Clayborn Bigness in 2 weeks Follow-up with  gastroenterology Dr. Marius Ditch in 1 month or as recommended  DIET:  Cardiac diet  DISCHARGE CONDITION:  Fair  ACTIVITY:  Activity as tolerated  OXYGEN:  Home Oxygen: No.   Oxygen Delivery: room air  DISCHARGE LOCATION:  home   If you experience worsening of your admission symptoms, develop shortness of breath, life threatening emergency, suicidal or homicidal thoughts you must seek medical attention immediately by calling 911 or calling your MD immediately  if symptoms less severe.  You Must read complete instructions/literature along with all the possible adverse reactions/side effects for all the Medicines you take and that have been prescribed to you. Take any new Medicines after you have completely understood and accpet all the possible adverse reactions/side effects.   Please note  You were cared for by a hospitalist during your hospital stay. If you have any questions about your discharge medications or the care you received while you were in the hospital after you are discharged, you can call the unit and asked to speak with the hospitalist on call if the hospitalist that  took care of you is not available. Once you are discharged, your primary care physician will handle any further medical issues. Please note that NO REFILLS for any discharge medications will be authorized once you are discharged, as it is imperative that you return to your primary care physician (or establish a relationship with a primary care physician if you do not have one) for your aftercare needs so that they can reassess your need for medications and monitor your lab values.     Today  Chief Complaint  Patient presents with  . Medication Reaction   Patient is feeling fine denies any palpitations or chest pain or shortness of breath.  No arrhythmias noticed on telemetry.  Okay to discharge patient from cardiology standpoint ROS:  CONSTITUTIONAL: Denies fevers, chills. Denies any fatigue, weakness.   EYES: Denies blurry vision, double vision, eye pain. EARS, NOSE, THROAT: Denies tinnitus, ear pain, hearing loss. RESPIRATORY: Denies cough, wheeze, shortness of breath.  CARDIOVASCULAR: Denies chest pain, palpitations, edema.  GASTROINTESTINAL: Denies nausea, vomiting, diarrhea, abdominal pain. Denies bright red blood per rectum. GENITOURINARY: Denies dysuria, hematuria. ENDOCRINE: Denies nocturia or thyroid problems. HEMATOLOGIC AND LYMPHATIC: Denies easy bruising or bleeding. SKIN: Denies rash or lesion. MUSCULOSKELETAL: Denies pain in neck, back, shoulder, knees, hips or arthritic symptoms.  NEUROLOGIC: Denies paralysis, paresthesias.  PSYCHIATRIC: Denies anxiety or depressive symptoms.   VITAL SIGNS:  Blood pressure 137/89, pulse 89, temperature 98.3 F (36.8 C), temperature source Oral, resp. rate 19, height  (1.626 m), weight 57.1 kg, SpO2 100 %.  I/O:    Intake/Output Summary (Last 24 hours) at 08/26/2019 1231 Last data filed at 08/26/2019 0900 Gross per 24 hour  Intake 120 ml  Output -  Net 120 ml    PHYSICAL EXAMINATION:  GENERAL:  73 y.o.-year-old patient lying in the bed with no acute distress.  EYES: Pupils equal, round, reactive to light and accommodation. No scleral icterus. Extraocular muscles intact.  HEENT: Head atraumatic, normocephalic. Oropharynx and nasopharynx clear.  NECK:  Supple, no jugular venous distention. No thyroid enlargement, no tenderness.  LUNGS: Normal breath sounds bilaterally, no wheezing, rales,rhonchi or crepitation. No use of accessory muscles of respiration.  CARDIOVASCULAR: S1, S2 normal. No murmurs, rubs, or gallops.  ABDOMEN: Soft, non-tender, non-distended. Bowel sounds present.  EXTREMITIES: No pedal edema, cyanosis, or clubbing.  NEUROLOGIC: Cranial nerves II through XII are intact. Muscle strength 5/5 in all extremities. Sensation intact. Gait not checked.  PSYCHIATRIC: The patient is alert and oriented x 3.  SKIN: No  obvious rash, lesion, or ulcer.   DATA REVIEW:   CBC Recent Labs  Lab 08/24/19 2308  WBC 6.0  HGB 12.6  HCT 38.1  PLT 209    Chemistries  Recent Labs  Lab 08/24/19 2308  NA 138  K 4.3  CL 99  CO2 29  GLUCOSE 109*  BUN 10  CREATININE 0.60  CALCIUM 9.4  MG 2.6*  AST 26  ALT 24  ALKPHOS 76  BILITOT 0.6    Cardiac Enzymes No results for input(s): TROPONINI in the last 168 hours.  Microbiology Results  Results for orders placed or performed during the hospital encounter of 08/25/19  SARS CORONAVIRUS 2 (TAT 6-24 HRS) Nasopharyngeal Nasopharyngeal Swab     Status: None   Collection Time: 08/25/19  1:55 PM   Specimen: Nasopharyngeal Swab  Result Value Ref Range Status   SARS Coronavirus 2 NEGATIVE NEGATIVE Final    Comment: (NOTE) SARS-CoV-2 target nucleic acids are NOT DETECTED. The  SARS-CoV-2 RNA is generally detectable in upper and lower respiratory specimens during the acute phase of infection. Negative results do not preclude SARS-CoV-2 infection, do not rule out co-infections with other pathogens, and should not be used as the sole basis for treatment or other patient management decisions. Negative results must be combined with clinical observations, patient history, and epidemiological information. The expected result is Negative. Fact Sheet for Patients: HairSlick.no Fact Sheet for Healthcare Providers: quierodirigir.com This test is not yet approved or cleared by the [place] FDA and  has been authorized for detection and/or diagnosis of SARS-CoV-2 by FDA under an Emergency Use Authorization (EUA). This EUA will remain  in effect (meaning this test can be used) for the duration of the COVID-19 declaration under Section 56 4(b)(1) of the Act, 21 U.S.C. section 360bbb-3(b)(1), unless the authorization is terminated or revoked sooner. Performed at [facility], [address],  [place], [place] [address]     RADIOLOGY:  No results found.  EKG:   Orders placed or performed during the hospital encounter of 08/25/19  . ED EKG  . ED EKG  . EKG 12-Lead  . EKG 12-Lead  . EKG 12-Lead  . EKG 12-Lead      Management plans discussed with the patient, family and they are in agreement.  CODE STATUS:     Code Status Orders  (From admission, onward)         Start     Ordered   08/25/19 0752  Do not attempt resuscitation (DNR)  Continuous    Question Answer Comment  In the event of cardiac or respiratory ARREST Do not call a "code blue"   In the event of cardiac or respiratory ARREST Do not perform Intubation, CPR, defibrillation or ACLS   In the event of cardiac or respiratory ARREST Use medication by any route, position, wound care, and other measures to relive pain and suffering. May use oxygen, suction and manual treatment of airway obstruction as needed for comfort.      08/25/19 0751        Code Status History    Date Active Date Inactive Code Status Order ID Comments User Context   08/25/2019 0746 08/25/2019 0751 Full Code [id number]  Arnaldo Natal, MD ED   08/25/2019 [phone] 08/25/2019 0746 DNR [id number]  Arnaldo Natal, MD ED   Advance Care Planning Activity    Advance Directive Documentation     Most Recent Value  Type of Advance Directive  Healthcare Power of Attorney, Living will  Pre-existing out of facility DNR order (yellow form or pink MOST form)  -  "MOST" Form in Place?  -      TOTAL TIME TAKING CARE OF THIS PATIENT: 45 minutes.   Note: This dictation was prepared with Dragon dictation along with smaller phrase technology. Any transcriptional errors that result from this process are unintentional.   @M[facility] @  on 08/26/2019 at 12:31 PM  Between 7am to 6pm - Pager - [phone]  After 6pm go to [email or web address] - password [email or web address] [facility]  [facility]  Office  [phone]  CC: Primary care physician; [phone], MD

## 2019-08-26 NOTE — Discharge Instructions (Signed)
Follow-up with primary care physician in 3 days Follow-up with cardiology Dr. Clayborn Bigness in 2 weeks Follow-up with gastroenterology Dr. Marius Ditch in 1 month or as recommended

## 2019-08-26 NOTE — Consult Note (Signed)
[place] for Apixaban Indication: atrial fibrillation  Allergies  Allergen Reactions  . Aspirin     Tinnitus  . Citalopram Other (See Comments)  . Codeine Nausea And Vomiting  . Penicillins Rash    Has patient had a PCN reaction causing immediate rash, facial/tongue/throat swelling, SOB or lightheadedness with hypotension: Yes Has patient had a PCN reaction causing severe rash involving mucus membranes or skin necrosis: No Has patient had a PCN reaction that required hospitalization No Has patient had a PCN reaction occurring within the last 10 years: No If all of the above answers are "NO", then may proceed with Cephalosporin use.    Patient Measurements: Height: 5\' 4"  (162.6 cm) Weight: 125 lb 12.8 oz (57.1 kg) IBW/kg (Calculated) : 54.7  Vital Signs: Temp: 98.3 F (36.8 C) (10/30 0730) Temp Source: Oral (10/30 0730) BP: 137/89 (10/30 0730) Pulse Rate: 89 (10/30 0730)  Labs: Recent Labs    08/24/19 2308  HGB 12.6  HCT 38.1  PLT 209  CREATININE 0.60  TROPONINIHS 8    Estimated Creatinine Clearance: 54.1 mL/min (by C-G formula based on SCr of 0.6 mg/dL).   Medical History: Past Medical History:  Diagnosis Date  . Allergy   . Corn of toe   . First degree AV block   . GERD (gastroesophageal reflux disease)   . Hammer toe of right foot   . Hyperglycemia   . Hyperlipidemia   . Hypertension   . Loss of appetite   . Prolapse of urethra   . Scoliosis (and kyphoscoliosis), idiopathic   . Thyroid disease   . Tinnitus of both ears   . Vitreous floaters of right eye     Medications:  Medications Prior to Admission  Medication Sig Dispense Refill Last Dose  . Ascorbic Acid (VITAMIN C) 1000 MG tablet Take 1,000 mg by mouth daily.   unknown at unknown  . conjugated estrogens (PREMARIN) vaginal cream Place 1 Applicatorful vaginally daily. 42.5 g 12 unknown at unknown  . famotidine (PEPCID) 40 MG tablet Take 1 tablet (40 mg  total) by mouth daily. 90 tablet 0 unknown at unknown  . fluticasone (FLONASE) 50 MCG/ACT nasal spray USE 1 SPRAY IN EACH NOSTRIL ONCE DAILY 16 g 0 unknown at unknown  . Garlic 2355 MG CAPS Take 1 capsule by mouth daily.    unknown at unknown  . Iron-Vitamins (GERITOL COMPLETE PO) Take 1 tablet by mouth daily.   unknown at unknown  . levothyroxine (SYNTHROID) 88 MCG tablet Take 1 tablet (88 mcg total) by mouth daily before breakfast. 90 tablet 0 unknown at unknown  . sertraline (ZOLOFT) 50 MG tablet Take 50 mg by mouth daily.   Past Week at Unknown time  . triamcinolone cream (KENALOG) 0.1 % Apply 1 application topically 2 (two) times daily. 45 g 0 unknown at unknown  . TURMERIC CURCUMIN PO Take 550 mg by mouth daily.   unknwon at unknown  . ARIPiprazole (ABILIFY) 2 MG tablet Take 1 tablet (2 mg total) by mouth daily. (Patient not taking: Reported on 08/25/2019) 30 tablet 0 Not Taking at Unknown time   Scheduled:  . conjugated estrogens  1 Applicatorful Vaginal Daily  . docusate sodium  100 mg Oral BID  . famotidine  40 mg Oral Daily  . fluticasone  1 spray Each Nare Daily  . levothyroxine  88 mcg Oral QAC breakfast  . metoprolol tartrate  12.5 mg Oral BID  . vitamin C  1,000  mg Oral Daily   Infusions:   PRN: acetaminophen **OR** acetaminophen, ondansetron **OR** ondansetron (ZOFRAN) IV  Assessment: Pharmacy consulted to start apixaban for afib.   Goal of Therapy:  Monitor platelets by anticoagulation protocol: Yes   Plan:  Will start apixaban 5 mg BID. Pt does not meet criteria for a dose reduction.   Ronnald Ramp, PharmD,  08/26/2019,10:53 AM

## 2019-08-26 NOTE — Plan of Care (Signed)

## 2019-08-29 ENCOUNTER — Telehealth: Payer: Self-pay

## 2019-08-29 NOTE — Telephone Encounter (Signed)
Transition Care Management Follow-up Telephone Call  Date of discharge and from where: 08/26/19 [facility]  How have you been since you were released from the hospital? Pt states she is doing okay  Any questions or concerns? No   Items Reviewed:  Did the pt receive and understand the discharge instructions provided? Yes   Medications obtained and verified? Yes   Any new allergies since your discharge? No   Dietary orders reviewed? Yes  Do you have support at home? Yes   Functional Questionnaire: (I = Independent and D = Dependent) ADLs: I  Bathing/Dressing- I  Meal Prep- I  Eating- I  Maintaining continence- I  Transferring/Ambulation- I  Managing Meds- I  Follow up appointments reviewed:   PCP Hospital f/u appt confirmed? No  Pt declined to schedule PCP follow up.   [facility] f/u appt confirmed? No pt to schedule with Dr. Clayborn Bigness  Are transportation arrangements needed? No   If their condition worsens, is the pt aware to call PCP or go to the Emergency Dept.? Yes  Was the patient provided with contact information for the PCP's office or ED? Yes  Was to pt encouraged to call back with questions or concerns? Yes

## 2019-09-06 ENCOUNTER — Encounter: Payer: Self-pay | Admitting: Emergency Medicine

## 2019-09-06 ENCOUNTER — Emergency Department
Admission: EM | Admit: 2019-09-06 | Discharge: 2019-09-06 | Disposition: A | Discharge status: Home / Self Care | Payer: Medicare HMO | Attending: Emergency Medicine | Admitting: Emergency Medicine

## 2019-09-06 ENCOUNTER — Emergency Department: Payer: Medicare HMO

## 2019-09-06 DIAGNOSIS — R079 Chest pain, unspecified: Secondary | ICD-10-CM | POA: Diagnosis not present

## 2019-09-06 DIAGNOSIS — R0789 Other chest pain: Secondary | ICD-10-CM | POA: Diagnosis not present

## 2019-09-06 DIAGNOSIS — Z7901 Long term (current) use of anticoagulants: Secondary | ICD-10-CM | POA: Diagnosis not present

## 2019-09-06 DIAGNOSIS — K219 Gastro-esophageal reflux disease without esophagitis: Secondary | ICD-10-CM | POA: Diagnosis not present

## 2019-09-06 DIAGNOSIS — I10 Essential (primary) hypertension: Secondary | ICD-10-CM | POA: Insufficient documentation

## 2019-09-06 DIAGNOSIS — I492 Junctional premature depolarization: Secondary | ICD-10-CM | POA: Diagnosis not present

## 2019-09-06 DIAGNOSIS — Z79899 Other long term (current) drug therapy: Secondary | ICD-10-CM | POA: Insufficient documentation

## 2019-09-06 DIAGNOSIS — E039 Hypothyroidism, unspecified: Secondary | ICD-10-CM | POA: Diagnosis not present

## 2019-09-06 DIAGNOSIS — R0602 Shortness of breath: Secondary | ICD-10-CM | POA: Diagnosis not present

## 2019-09-06 LAB — BASIC METABOLIC PANEL
Anion gap: 10 (ref 5–15)
BUN: 10 mg/dL (ref 8–23)
CO2: 29 mmol/L (ref 22–32)
Calcium: 9.4 mg/dL (ref 8.9–10.3)
Chloride: 98 mmol/L (ref 98–111)
Creatinine, Ser: 0.66 mg/dL (ref 0.44–1.00)
GFR calc Af Amer: >60 mL/min (ref >60)
GFR calc non Af Amer: >60 mL/min (ref >60)
Glucose, Bld: 115 mg/dL — ABNORMAL HIGH (ref 70–99)
Potassium: 4.6 mmol/L (ref 3.5–5.1)
Sodium: 137 mmol/L (ref 135–145)

## 2019-09-06 LAB — CBC
HCT: 38.9 % (ref 36.0–46.0)
Hemoglobin: 12.7 g/dL (ref 12.0–15.0)
MCH: 28.7 pg (ref 26.0–34.0)
MCHC: 32.6 g/dL (ref 30.0–36.0)
MCV: 88 fL (ref 80.0–100.0)
Platelets: 229 10*3/uL (ref 150–400)
RBC: 4.42 MIL/uL (ref 3.87–5.11)
RDW: 14.6 % (ref 11.5–15.5)
WBC: 5.4 10*3/uL (ref 4.0–10.5)
nRBC: 0 % (ref 0.0–0.2)

## 2019-09-06 LAB — TROPONIN I (HIGH SENSITIVITY)
Troponin I (High Sensitivity): 5 ng/L (ref <18)
Troponin I (High Sensitivity): 5 ng/L (ref <18)

## 2019-09-06 MED ORDER — ALUM & MAG HYDROXIDE-SIMETH 200-200-20 MG/5ML PO SUSP
15.0000 mL | Freq: Once | ORAL | Status: AC
  Administered 2019-09-06: 15 mL via ORAL
  Filled 2019-09-06: qty 30

## 2019-09-06 MED ORDER — LIDOCAINE VISCOUS HCL 2 % MT SOLN
15.0000 mL | Freq: Once | OROMUCOSAL | Status: AC
  Administered 2019-09-06: 15 mL via ORAL
  Filled 2019-09-06: qty 15

## 2019-09-06 NOTE — ED Provider Notes (Signed)
[facility] Emergency Department Provider Note   ____________________________________________   First MD Initiated Contact with Patient 09/06/19 [phone]     (approximate)  I have reviewed the triage vital signs and the nursing notes.   HISTORY  Chief Complaint Chest Pain and Shortness of Breath    HPI Maureen [name] is a 73 y.o. female with past medical history of hypertension, hyperlipidemia, and GERD who presents to the ED complaining of chest discomfort.  Patient reports since 1030 last night she has been dealing with a discomfort in the center of her chest, which feels like something is stuck there.  She states it is not particularly painful, but that it was bothering her and making it difficult for her to sleep.  She also complains that she has had a hard time eating recently but that she has a number of broken teeth that dentistry is planning on pulling.  She denies any nausea or vomiting and states that her appetite is good.  She denies any difficulty swallowing, states more that it is hard for her to chew the food.  She has not had any fevers, cough, shortness of breath, leg swelling or pain.        Past Medical History:  Diagnosis Date  . Allergy   . Corn of toe   . First degree AV block   . GERD (gastroesophageal reflux disease)   . Hammer toe of right foot   . Hyperglycemia   . Hyperlipidemia   . Hypertension   . Loss of appetite   . Prolapse of urethra   . Scoliosis (and kyphoscoliosis), idiopathic   . Thyroid disease   . Tinnitus of both ears   . Vitreous floaters of right eye     Patient Active Problem List   Diagnosis Date Noted  . Arrhythmia 08/25/2019  . Rectal bleeding   . Low grade squamous intraepith lesion on cytologic smear cervix (lgsil) 08/04/2018  . Chronic venous insufficiency 07/05/2018  . Lymphedema 07/05/2018  . Hypertension 04/02/2018  . Swelling of limb 04/02/2018  . Depression, major, recurrent, mild (HCC)  12/29/2017  . Leukopenia 05/24/2017  . Allergic rhinitis, seasonal 06/29/2015  . Clavus 06/29/2015  . 1st degree AV block 06/29/2015  . Hammer toe 06/29/2015  . H/O: HTN (hypertension) 06/29/2015  . Blood glucose elevated 06/29/2015  . Floater, vitreous 06/29/2015  . Bilateral tinnitus 06/29/2015  . Hypothyroidism (acquired) 04/02/2015  . Gastroesophageal reflux disease 04/02/2015  . Hammer toe of right foot 04/02/2015  . Scoliosis of thoracic spine 04/02/2015  . Urethral prolapse 04/02/2015  . Corn of toe 04/02/2015  . Hyperlipidemia 04/02/2015  . Varicose veins of both lower extremities 04/02/2015    Past Surgical History:  Procedure Laterality Date  . ABDOMINAL HYSTERECTOMY  1989  . APPENDECTOMY  1989  . BREAST EXCISIONAL BIOPSY Bilateral    multiple biopsies negative  . BREAST SURGERY     Several  . COLONOSCOPY WITH PROPOFOL N/A 07/28/2019   Procedure: COLONOSCOPY WITH PROPOFOL;  Surgeon: Toney Reil, MD;  Location: [facility] ENDOSCOPY;  Service: Gastroenterology;  Laterality: N/A;  . TUMOR EXCISION  1989   same time as hysterectomy    Prior to Admission medications   Medication Sig Start Date End Date Taking? Authorizing Provider  acetaminophen (TYLENOL) 325 MG tablet Take 2 tablets (650 mg total) by mouth every 6 (six) hours as needed for mild pain (or Fever >/= 101). 08/26/19   Ramonita Lab, MD  apixaban Everlene Balls) 5  MG TABS tablet Take 1 tablet (5 mg total) by mouth 2 (two) times daily. 08/26/19   Nicholes Mango, MD  Ascorbic Acid (VITAMIN C) 1000 MG tablet Take 1,000 mg by mouth daily.    [provider]  conjugated estrogens (PREMARIN) vaginal cream Place 1 Applicatorful vaginally daily. 01/11/19   Steele Sizer, MD  docusate sodium (COLACE) 100 MG capsule Take 1 capsule (100 mg total) by mouth daily as needed for mild constipation. 08/26/19   Nicholes Mango, MD  famotidine (PEPCID) 40 MG tablet Take 1 tablet (40 mg total) by mouth daily. 01/11/19   Steele Sizer, MD  fluticasone (FLONASE) 50 MCG/ACT nasal spray USE 1 SPRAY IN EACH NOSTRIL ONCE DAILY 04/18/19   Steele Sizer, MD  Garlic 1540 MG CAPS Take 1 capsule by mouth daily.     [provider]  Iron-Vitamins (GERITOL COMPLETE PO) Take 1 tablet by mouth daily.    [provider]  levothyroxine (SYNTHROID) 88 MCG tablet Take 1 tablet (88 mcg total) by mouth daily before breakfast. 06/27/19   Ancil Boozer, Drue Stager, MD  metoprolol tartrate (LOPRESSOR) 25 MG tablet Take 0.5 tablets (12.5 mg total) by mouth 2 (two) times daily. 08/26/19   Gouru, Illene Silver, MD  triamcinolone cream (KENALOG) 0.1 % Apply 1 application topically 2 (two) times daily. 01/11/19   Steele Sizer, MD  TURMERIC CURCUMIN PO Take 550 mg by mouth daily.    [provider]    Allergies Aspirin, Citalopram, Codeine, and Penicillins  Family History  Problem Relation Age of Onset  . Diabetes Father 47       DM complications  . Hypertension Mother 55       CKD  . Thyroid disease Mother   . Heart failure Mother   . Kidney disease Mother   . Breast cancer Sister 76  . Kidney disease Other   . Breast cancer Maternal Aunt   . Breast cancer Cousin   . Anuerysm Son 7  . Diabetes Son     Social History Social History   Tobacco Use  . Smoking status: Never Smoker  . Smokeless tobacco: Never Used  . Tobacco comment: smoking cessation materials not required  Substance Use Topics  . Alcohol use: Never    Alcohol/week: 0.0 standard drinks    Frequency: Never  . Drug use: No    Review of Systems  Constitutional: No fever/chills Eyes: No visual changes. ENT: No sore throat. Cardiovascular: Positive for chest pain. Respiratory: Denies shortness of breath. Gastrointestinal: No abdominal pain.  No nausea, no vomiting.  No diarrhea.  No constipation. Genitourinary: Negative for dysuria. Musculoskeletal: Negative for back pain. Skin: Negative for rash. Neurological: Negative for headaches, focal  weakness or numbness.  ____________________________________________   PHYSICAL EXAM:  VITAL SIGNS: ED Triage Vitals  Enc Vitals Group     BP 09/06/19 0508 (!) 148/109     Pulse Rate 09/06/19 0508 (!) 101     Resp 09/06/19 0508 18     Temp 09/06/19 0508 98.2 F (36.8 C)     Temp Source 09/06/19 0508 Oral     SpO2 09/06/19 0508 100 %     Weight --      Height --      Head Circumference --      Peak Flow --      Pain Score 09/06/19 0650 0     Pain Loc --      Pain Edu? --      Excl. in [place]? --  Constitutional: Alert and oriented. Eyes: Conjunctivae are normal. Head: Atraumatic. Nose: No congestion/rhinnorhea. Mouth/Throat: Mucous membranes are moist. Neck: Normal ROM Cardiovascular: Normal rate, regular rhythm.  Systolic murmur. Respiratory: Normal respiratory effort.  No retractions. Lungs CTAB. Gastrointestinal: Soft and nontender. No distention. Genitourinary: deferred Musculoskeletal: No lower extremity tenderness nor edema. Neurologic:  Normal speech and language. No gross focal neurologic deficits are appreciated. Skin:  Skin is warm, dry and intact. No rash noted. Psychiatric: Mood and affect are normal. Speech and behavior are normal.  ____________________________________________   LABS (all labs ordered are listed, but only abnormal results are displayed)  Labs Reviewed  BASIC METABOLIC PANEL - Abnormal; Notable for the following components:      Result Value   Glucose, Bld 115 (*)    All other components within normal limits  CBC  TROPONIN I (HIGH SENSITIVITY)  TROPONIN I (HIGH SENSITIVITY)   ____________________________________________  EKG  ED ECG REPORT I, Chesley Noonharles Zakara Parkey, the attending physician, personally viewed and interpreted this ECG.   Date: 09/06/2019  EKG Time: 5:09  Rate: 95  Rhythm: normal sinus rhythm  Axis: Normal  Intervals:first-degree A-V block   ST&T Change: None   PROCEDURES  Procedure(s) performed (including  Critical Care):  Procedures   ____________________________________________   INITIAL IMPRESSION / ASSESSMENT AND PLAN / ED COURSE       73 year old female with history of hypertension, hyperlipidemia, and GERD presents to the ED with discomfort in the center of her chest and feeling like something is stuck since about 1030 last night.  Her EKG shows no acute ischemic changes, computer reading as accelerated junctional rhythm although this appears to be sinus rhythm with first-degree block, consistent with patient's history.  Chest x-ray is negative for acute process.  Initial troponin within normal limits, will plan on recheck of second set troponin.  Her heart score is 3 and so she would be appropriate for discharge home with close follow-up given 2 sets negative troponin.  Suspect that her symptoms are secondary to reflux, will treat with GI cocktail.  Repeat troponin within normal limits, patient symptoms now resolved following GI cocktail.  Counseled patient to follow-up with her PCP for further evaluation and to return to the ED for new or worsening symptoms.  Patient agrees with plan.      ____________________________________________   FINAL CLINICAL IMPRESSION(S) / ED DIAGNOSES  Final diagnoses:  Chest pain, unspecified type  Gastroesophageal reflux disease, unspecified whether esophagitis present     ED Discharge Orders    None       Note:  This document was prepared using Dragon voice recognition software and may include unintentional dictation errors.   Chesley NoonJessup, Angelmarie Ponzo, MD 09/06/19 [phone]

## 2019-09-06 NOTE — ED Notes (Signed)
Pt states improvement after GI cocktail, MD aware

## 2019-09-06 NOTE — ED Triage Notes (Signed)
Pt report she feels like she "need to burp but cant." Pt denies pain but sts, "I feel like I have something stuck right here in the middle of my chest." Pt able to speak in complete sentences. No obvious distress noted.

## 2019-09-09 ENCOUNTER — Other Ambulatory Visit: Payer: Self-pay

## 2019-09-09 ENCOUNTER — Ambulatory Visit (INDEPENDENT_AMBULATORY_CARE_PROVIDER_SITE_OTHER): Payer: Medicare HMO | Admitting: Family Medicine

## 2019-09-09 ENCOUNTER — Encounter: Payer: Self-pay | Admitting: Family Medicine

## 2019-09-09 VITALS — BP 138/84 | HR 100 | Temp 97.9°F | Resp 14 | Ht 64.0 in | Wt 127.9 lb

## 2019-09-09 DIAGNOSIS — K2100 Gastro-esophageal reflux disease with esophagitis, without bleeding: Secondary | ICD-10-CM

## 2019-09-09 MED ORDER — PANTOPRAZOLE SODIUM 40 MG PO PACK: 20.0000 mg | PACK | Freq: Every day | ORAL | 1 refills | Status: DC

## 2019-09-09 MED ORDER — SUCRALFATE 1 GM/10ML PO SUSP: 1.0000 g | Freq: Three times a day (TID) | ORAL | 0 refills | Status: DC

## 2019-09-09 MED ORDER — PANTOPRAZOLE SODIUM 40 MG PO PACK: 20.0000 mg | PACK | Freq: Two times a day (BID) | ORAL | 1 refills | Status: DC

## 2019-09-09 NOTE — Progress Notes (Signed)
Patient ID: Maureen [name], female    DOB: [birth year], 73 y.o.   MRN: [id number]  PCP: Alba Cory, MD  Chief Complaint  Patient presents with  . Gastroesophageal Reflux    Subjective:   Maureen [name] is a 73 y.o. female, presents to clinic with CC of the following:  Gastroesophageal Reflux She complains of belching, choking (difficulty currently with swallowing some solids/pills/chewing tablets), early satiety and heartburn. She reports no abdominal pain, no chest pain, no coughing, no dysphagia, no globus sensation, no hoarse voice, no nausea, no sore throat or no wheezing. pain from epigastrum to neck/throat. This is a new problem. The current episode started 1 to 4 weeks ago (~4 weeks ago with dental work and gingival infection and treatments). The problem occurs constantly. The problem has been gradually worsening. Heartburn duration: intermittent, daily multiple episodes minutes to hours. The heartburn is of severe (moderate to severe) intensity. The heartburn wakes her from sleep. The heartburn does not limit her activity. The heartburn changes with position. The symptoms are aggravated by stress, lying down and certain foods. Associated symptoms include weight loss (weight loss secondary to dental work ). Pertinent negatives include no anemia, fatigue, melena, muscle weakness or orthopnea. Risk factors: no NSAIDs, ETOH, smoking, caffeine, denies recent antibiotics other than "mouth rinse" She has tried an antibiotic (maalox, mylanta, tums) for the symptoms.    Recent dental work with gum infection, she has been upset, increased stress, unable to take her pepcid for known GERD, sx worsening and pain and discomfort described as gassiness, indigestion, feels like she needs to burp On a soft and liquid diet   Patient Active Problem List   Diagnosis Date Noted  . Arrhythmia 08/25/2019  . Rectal bleeding   . Low grade squamous intraepith lesion on cytologic smear cervix (lgsil)  08/04/2018  . Chronic venous insufficiency 07/05/2018  . Lymphedema 07/05/2018  . Hypertension 04/02/2018  . Swelling of limb 04/02/2018  . Depression, major, recurrent, mild (HCC) 12/29/2017  . Leukopenia 05/24/2017  . Allergic rhinitis, seasonal 06/29/2015  . Clavus 06/29/2015  . 1st degree AV block 06/29/2015  . Hammer toe 06/29/2015  . H/O: HTN (hypertension) 06/29/2015  . Blood glucose elevated 06/29/2015  . Floater, vitreous 06/29/2015  . Bilateral tinnitus 06/29/2015  . Hypothyroidism (acquired) 04/02/2015  . Gastroesophageal reflux disease 04/02/2015  . Hammer toe of right foot 04/02/2015  . Scoliosis of thoracic spine 04/02/2015  . Urethral prolapse 04/02/2015  . Corn of toe 04/02/2015  . Hyperlipidemia 04/02/2015  . Varicose veins of both lower extremities 04/02/2015      Current Outpatient Medications:  .  acetaminophen (TYLENOL) 325 MG tablet, Take 2 tablets (650 mg total) by mouth every 6 (six) hours as needed for mild pain (or Fever >/= 101)., Disp:  , Rfl:  .  apixaban (ELIQUIS) 5 MG TABS tablet, Take 1 tablet (5 mg total) by mouth 2 (two) times daily., Disp: 60 tablet, Rfl: 0 .  Ascorbic Acid (VITAMIN C) 1000 MG tablet, Take 1,000 mg by mouth daily., Disp: , Rfl:  .  conjugated estrogens (PREMARIN) vaginal cream, Place 1 Applicatorful vaginally daily., Disp: 42.5 g, Rfl: 12 .  docusate sodium (COLACE) 100 MG capsule, Take 1 capsule (100 mg total) by mouth daily as needed for mild constipation., Disp: 10 capsule, Rfl: 0 .  famotidine (PEPCID) 40 MG tablet, Take 1 tablet (40 mg total) by mouth daily., Disp: 90 tablet, Rfl: 0 .  fluticasone (FLONASE) 50 MCG/ACT nasal  spray, USE 1 SPRAY IN EACH NOSTRIL ONCE DAILY, Disp: 16 g, Rfl: 0 .  Garlic 9323 MG CAPS, Take 1 capsule by mouth daily. , Disp: , Rfl:  .  Iron-Vitamins (GERITOL COMPLETE PO), Take 1 tablet by mouth daily., Disp: , Rfl:  .  levothyroxine (SYNTHROID) 88 MCG tablet, Take 1 tablet (88 mcg total) by mouth  daily before breakfast., Disp: 90 tablet, Rfl: 0 .  metoprolol tartrate (LOPRESSOR) 25 MG tablet, Take 0.5 tablets (12.5 mg total) by mouth 2 (two) times daily., Disp: 60 tablet, Rfl: 0 .  triamcinolone cream (KENALOG) 0.1 %, Apply 1 application topically 2 (two) times daily., Disp: 45 g, Rfl: 0 .  TURMERIC CURCUMIN PO, Take 550 mg by mouth daily., Disp: , Rfl:    Allergies  Allergen Reactions  . Aspirin     Tinnitus  . Citalopram Other (See Comments)  . Codeine Nausea And Vomiting  . Penicillins Rash    Has patient had a PCN reaction causing immediate rash, facial/tongue/throat swelling, SOB or lightheadedness with hypotension: Yes Has patient had a PCN reaction causing severe rash involving mucus membranes or skin necrosis: No Has patient had a PCN reaction that required hospitalization No Has patient had a PCN reaction occurring within the last 10 years: No If all of the above answers are "NO", then may proceed with Cephalosporin use.     Family History  Problem Relation Age of Onset  . Diabetes Father 52       DM complications  . Hypertension Mother 52       CKD  . Thyroid disease Mother   . Heart failure Mother   . Kidney disease Mother   . Breast cancer Sister 46  . Kidney disease Other   . Breast cancer Maternal Aunt   . Breast cancer Cousin   . Anuerysm Son 101  . Diabetes Son      Social History   Socioeconomic History  . Marital status: Widowed    Spouse name: Not on file  . Number of children: 3  . Years of education: 40  . Highest education level: 12th grade  Occupational History  . Occupation: Retired  Scientific laboratory technician  . Financial resource strain: Not hard at all  . Food insecurity    Worry: Never true    Inability: Never true  . Transportation needs    Medical: No    Non-medical: No  Tobacco Use  . Smoking status: Never Smoker  . Smokeless tobacco: Never Used  . Tobacco comment: smoking cessation materials not required  Substance and Sexual  Activity  . Alcohol use: Never    Alcohol/week: 0.0 standard drinks    Frequency: Never  . Drug use: No  . Sexual activity: Not Currently    Partners: Male  Lifestyle  . Physical activity    Days per week: 3 days    Minutes per session: 30 min  . Stress: Not at all  Relationships  . Social connections    Talks on phone: More than three times a week    Gets together: More than three times a week    Attends religious service: More than 4 times per year    Active member of club or organization: Yes    Attends meetings of clubs or organizations: More than 4 times per year    Relationship status: Widowed  . Intimate partner violence    Fear of current or ex partner: No    Emotionally abused: No  Physically abused: No    Forced sexual activity: No  Other Topics Concern  . Not on file  Social History Narrative  . Not on file    I personally reviewed active problem list, medication list, allergies, family history, social history, health maintenance, notes from last encounter, lab results, imaging with the patient/caregiver today.  Review of Systems  Constitutional: Positive for weight loss (weight loss secondary to dental work ). Negative for activity change, chills, diaphoresis, fatigue and fever.  HENT: Negative.  Negative for hoarse voice and sore throat.   Eyes: Negative.   Respiratory: Positive for choking (difficulty currently with swallowing some solids/pills/chewing tablets). Negative for cough, chest tightness, shortness of breath and wheezing.   Cardiovascular: Negative.  Negative for chest pain, palpitations and leg swelling.  Gastrointestinal: Positive for heartburn. Negative for abdominal distention, abdominal pain, anal bleeding, blood in stool, constipation, diarrhea, dysphagia, melena, nausea and vomiting.  Endocrine: Negative.   Genitourinary: Negative.   Musculoskeletal: Negative.  Negative for back pain, myalgias and muscle weakness.  Skin: Negative.  Negative  for color change and pallor.  Allergic/Immunologic: Negative.   Neurological: Negative.   Hematological: Negative.   Psychiatric/Behavioral: Negative.   All other systems reviewed and are negative.      Objective:   Vitals:   09/09/19 1330  BP: 138/84  Pulse: 100  Resp: 14  Temp: 97.9 F (36.6 C)  TempSrc: Temporal  SpO2: 98%  Weight: 127 lb 14.4 oz (58 kg)  Height: 5\' 4"  (1.626 m)    Body mass index is 21.95 kg/m.  Physical Exam Vitals signs and nursing note reviewed.  Constitutional:      General: She is not in acute distress.    Appearance: Normal appearance. She is well-developed. She is not ill-appearing, toxic-appearing or diaphoretic.     Interventions: Face mask in place.  HENT:     Head: Normocephalic and atraumatic.     Right Ear: External ear normal.     Left Ear: External ear normal.     Nose: Nose normal. No congestion.     Mouth/Throat:     Lips: Pink.     Mouth: Mucous membranes are moist. No oral lesions or angioedema.     Pharynx: Oropharynx is clear. Uvula midline. No pharyngeal swelling, oropharyngeal exudate, posterior oropharyngeal erythema or uvula swelling.     Tonsils: No tonsillar exudate. 0 on the right. 0 on the left.     Comments: Upper dentures removed for exam, hard and soft palate normal-appearing, OP normal Gums diffusely pink and normal appearing Eyes:     General: Lids are normal. No scleral icterus.       Right eye: No discharge.        Left eye: No discharge.     Conjunctiva/sclera: Conjunctivae normal.  Neck:     Musculoskeletal: Normal range of motion and neck supple.     Trachea: Phonation normal. No tracheal deviation.  Cardiovascular:     Rate and Rhythm: Normal rate and regular rhythm.     Pulses: Normal pulses.          Radial pulses are 2+ on the right side and 2+ on the left side.       Posterior tibial pulses are 2+ on the right side and 2+ on the left side.     Heart sounds: Normal heart sounds. No murmur. No  friction rub. No gallop.   Pulmonary:     Effort: Pulmonary effort is normal. No respiratory distress.  Breath sounds: Normal breath sounds. No stridor. No wheezing, rhonchi or rales.  Chest:     Chest wall: No tenderness.  Abdominal:     General: Abdomen is flat. Bowel sounds are normal. There is no distension or abdominal bruit.     Palpations: Abdomen is soft. There is no hepatomegaly, splenomegaly, mass or pulsatile mass.     Tenderness: There is no abdominal tenderness. There is no right CVA tenderness, left CVA tenderness, guarding or rebound.     Comments: No abdominal tenderness no guarding no rebound  Musculoskeletal: Normal range of motion.        General: No deformity.     Right lower leg: No edema.     Left lower leg: No edema.  Lymphadenopathy:     Cervical: No cervical adenopathy.  Skin:    General: Skin is warm and dry.     Capillary Refill: Capillary refill takes less than 2 seconds.     Coloration: Skin is not jaundiced or pale.     Findings: No rash.  Neurological:     Mental Status: She is alert and oriented to person, place, and time.     Motor: No abnormal muscle tone.     Gait: Gait normal.  Psychiatric:        Speech: Speech normal.        Behavior: Behavior normal.      Results for orders placed or performed during the hospital encounter of 09/06/19  Basic metabolic panel  Result Value Ref Range   Sodium 137 135 - 145 mmol/L   Potassium 4.6 3.5 - 5.1 mmol/L   Chloride 98 98 - 111 mmol/L   CO2 29 22 - 32 mmol/L   Glucose, Bld 115 (H) 70 - 99 mg/dL   BUN 10 8 - 23 mg/dL   Creatinine, Ser 4.78 0.44 - 1.00 mg/dL   Calcium 9.4 8.9 - 29.5 mg/dL   GFR calc non Af Amer >60 >60 mL/min   GFR calc Af Amer >60 >60 mL/min   Anion gap 10 5 - 15  CBC  Result Value Ref Range   WBC 5.4 4.0 - 10.5 K/uL   RBC 4.42 3.87 - 5.11 MIL/uL   Hemoglobin 12.7 12.0 - 15.0 g/dL   HCT 62.1 30.8 - 65.7 %   MCV 88.0 80.0 - 100.0 fL   MCH 28.7 26.0 - 34.0 pg   MCHC 32.6  30.0 - 36.0 g/dL   RDW 84.6 96.2 - 95.2 %   Platelets 229 150 - 400 K/uL   nRBC 0.0 0.0 - 0.2 %  Troponin I (High Sensitivity)  Result Value Ref Range   Troponin I (High Sensitivity) 5 <18 ng/L  Troponin I (High Sensitivity)  Result Value Ref Range   Troponin I (High Sensitivity) 5 <18 ng/L        Assessment & Plan:   Patient is a 73 year old female she presents to clinic with her daughter, she walked into the clinic this afternoon hoping to be seen for worsening GERD symptoms.  We are able to fortunately see her today. Concerning for worsening GERD with possible gastritis, esophagitis and or gastric or peptic ulcers.  Due to recent dental work her stress has been increased and she has been unable to true her medications including Pepcid which she is prescribed for GERD.  Other over-the-counter medicines have not been helpful including Maalox and Mylanta  Had tried to get her today suspensions or solutions for Carafate and Protonix, encouraged her  to still use Zantac or Pepcid as needed during the day if they can find any liquid form or they can crush chewable and dissolve it.  Lifestyle changes and food list and medications reviewed with the patient with her daughter, no current medicines or foods which would be exacerbating her GERD but they did verbalize understanding of what to avoid.  Patient's daughter was encouraged to get a PPI over-the-counter if the pharmacy did not carry Protonix solution or if it was not covered by insurance, including Prilosec, Nexium and Prevacid.  Reviewed red flags that are concerning that she needs emergent or immediate evaluation they verbalized understanding.  Encouraged her to follow-up in 2 weeks with her PCP if symptoms or not starting to gradually improve by then.       ICD-10-CM   1. Gastroesophageal reflux disease with esophagitis without hemorrhage  K21.00 sucralfate (CARAFATE) 1 GM/10ML suspension    pantoprazole sodium (PROTONIX) 40 mg/20 mL  PACK            Danelle Berry, PA-C 09/09/19 1:52 PM

## 2019-09-09 NOTE — Patient Instructions (Addendum)
Can try some Boost or Ensure to increase your calories   Carafate liquid is up to 4 x a day before meals to help with pain  Protonix (pantoprazole) is first thing in the morning before food or other meds   Follow up in 2 weeks if not better   Any vomiting, severe belly, black poop, blood in poop - go to the ER   Food Choices for Gastroesophageal Reflux Disease, Adult When you have gastroesophageal reflux disease (GERD), the foods you eat and your eating habits are very important. Choosing the right foods can help ease your discomfort. Think about working with a nutrition specialist (dietitian) to help you make good choices. What are tips for following this plan?  Meals  Choose healthy foods that are low in fat, such as fruits, vegetables, whole grains, low-fat dairy products, and lean meat, fish, and poultry.  Eat small meals often instead of 3 large meals a day. Eat your meals slowly, and in a place where you are relaxed. Avoid bending over or lying down until 2-3 hours after eating.  Avoid eating meals 2-3 hours before bed.  Avoid drinking a lot of liquid with meals.  Cook foods using methods other than frying. Bake, grill, or broil food instead.  Avoid or limit: ? Chocolate. ? Peppermint or spearmint. ? Alcohol. ? Pepper. ? Black and decaffeinated coffee. ? Black and decaffeinated tea. ? Bubbly (carbonated) soft drinks. ? Caffeinated energy drinks and soft drinks.  Limit high-fat foods such as: ? Fatty meat or fried foods. ? Whole milk, cream, butter, or ice cream. ? Nuts and nut butters. ? Pastries, donuts, and sweets made with butter or shortening.  Avoid foods that cause symptoms. These foods may be different for everyone. Common foods that cause symptoms include: ? Tomatoes. ? Oranges, lemons, and limes. ? Peppers. ? Spicy food. ? Onions and garlic. ? Vinegar. Lifestyle  Maintain a healthy weight. Ask your doctor what weight is healthy for you. If you need to  lose weight, work with your doctor to do so safely.  Exercise for at least 30 minutes for 5 or more days each week, or as told by your doctor.  Wear loose-fitting clothes.  Do not smoke. If you need help quitting, ask your doctor.  Sleep with the head of your bed higher than your feet. Use a wedge under the mattress or blocks under the bed frame to raise the head of the bed. Summary  When you have gastroesophageal reflux disease (GERD), food and lifestyle choices are very important in easing your symptoms.  Eat small meals often instead of 3 large meals a day. Eat your meals slowly, and in a place where you are relaxed.  Limit high-fat foods such as fatty meat or fried foods.  Avoid bending over or lying down until 2-3 hours after eating.  Avoid peppermint and spearmint, caffeine, alcohol, and chocolate. This information is not intended to replace advice given to you by your health care provider. Make sure you discuss any questions you have with your health care provider. Document Released: 04/13/2012 Document Revised: 02/03/2019 Document Reviewed: 11/18/2016 Elsevier Patient Education  2020 [employer].

## 2019-09-22 ENCOUNTER — Emergency Department
Admission: EM | Admit: 2019-09-22 | Discharge: 2019-09-22 | Disposition: A | Discharge status: Home / Self Care | Payer: Medicare HMO | Attending: Emergency Medicine | Admitting: Emergency Medicine

## 2019-09-22 ENCOUNTER — Other Ambulatory Visit: Payer: Self-pay

## 2019-09-22 DIAGNOSIS — K219 Gastro-esophageal reflux disease without esophagitis: Secondary | ICD-10-CM | POA: Diagnosis not present

## 2019-09-22 DIAGNOSIS — I4891 Unspecified atrial fibrillation: Secondary | ICD-10-CM | POA: Diagnosis not present

## 2019-09-22 DIAGNOSIS — I10 Essential (primary) hypertension: Secondary | ICD-10-CM | POA: Diagnosis not present

## 2019-09-22 DIAGNOSIS — I498 Other specified cardiac arrhythmias: Secondary | ICD-10-CM | POA: Insufficient documentation

## 2019-09-22 DIAGNOSIS — R079 Chest pain, unspecified: Secondary | ICD-10-CM | POA: Diagnosis not present

## 2019-09-22 DIAGNOSIS — Z79899 Other long term (current) drug therapy: Secondary | ICD-10-CM | POA: Insufficient documentation

## 2019-09-22 DIAGNOSIS — I492 Junctional premature depolarization: Secondary | ICD-10-CM | POA: Diagnosis not present

## 2019-09-22 DIAGNOSIS — K921 Melena: Secondary | ICD-10-CM | POA: Diagnosis present

## 2019-09-22 DIAGNOSIS — Z8679 Personal history of other diseases of the circulatory system: Secondary | ICD-10-CM

## 2019-09-22 LAB — COMPREHENSIVE METABOLIC PANEL
ALT: 22 U/L (ref 0–44)
AST: 38 U/L (ref 15–41)
Albumin: 4.4 g/dL (ref 3.5–5.0)
Alkaline Phosphatase: 62 U/L (ref 38–126)
Anion gap: 11 (ref 5–15)
BUN: 11 mg/dL (ref 8–23)
CO2: 27 mmol/L (ref 22–32)
Calcium: 9.1 mg/dL (ref 8.9–10.3)
Chloride: 95 mmol/L — ABNORMAL LOW (ref 98–111)
Creatinine, Ser: 0.82 mg/dL (ref 0.44–1.00)
GFR calc Af Amer: >60 mL/min (ref >60)
GFR calc non Af Amer: >60 mL/min (ref >60)
Glucose, Bld: 176 mg/dL — ABNORMAL HIGH (ref 70–99)
Potassium: 4.7 mmol/L (ref 3.5–5.1)
Sodium: 133 mmol/L — ABNORMAL LOW (ref 135–145)
Total Bilirubin: 1 mg/dL (ref 0.3–1.2)
Total Protein: 7.9 g/dL (ref 6.5–8.1)

## 2019-09-22 LAB — CBC
HCT: 39.3 % (ref 36.0–46.0)
Hemoglobin: 13.1 g/dL (ref 12.0–15.0)
MCH: 29.4 pg (ref 26.0–34.0)
MCHC: 33.3 g/dL (ref 30.0–36.0)
MCV: 88.3 fL (ref 80.0–100.0)
Platelets: 205 10*3/uL (ref 150–400)
RBC: 4.45 MIL/uL (ref 3.87–5.11)
RDW: 14.6 % (ref 11.5–15.5)
WBC: 3.8 10*3/uL — ABNORMAL LOW (ref 4.0–10.5)
nRBC: 0 % (ref 0.0–0.2)

## 2019-09-22 LAB — MAGNESIUM: Magnesium: 2.3 mg/dL (ref 1.7–2.4)

## 2019-09-22 LAB — TROPONIN I (HIGH SENSITIVITY): Troponin I (High Sensitivity): 6 ng/L (ref <18)

## 2019-09-22 NOTE — ED Triage Notes (Signed)
Pt states that she had some abd pain last pm, not bad now, states last night she felt like she had some gurgling in her chest that may have been her acid reflux, pt states that she has also had 2 dark stools, pt reports having issues with her new partials and she has been only eating mashed potatoes.

## 2019-09-22 NOTE — ED Provider Notes (Signed)
[facility] Emergency Department Provider Note   ____________________________________________   First MD Initiated Contact with Patient 09/22/19 1345     (approximate)  I have reviewed the triage vital signs and the nursing notes.   HISTORY  Chief Complaint Abdominal Pain    HPI Maureen [name] is a 73 y.o. female with past medical history of GERD, hypertension, hyperlipidemia, and A. fib who presents to the ED complaining of dark stool.  Patient reports that she had 1 dark bowel movement just prior to arrival and was concerned it might have contained blood.  She also states that last night she had some upper abdominal discomfort and "felt my stomach gurgling".  She states that the abdominal pain has since resolved and she has not had any nausea or vomiting.  She also denies any fevers, cough, chest pain, or shortness of breath.  She does admit to recently using Pepto-Bismol and her son told her that might be the cause of the dark stools.  She denies any lightheadedness or syncope.  She was recently prescribed Eliquis, but states she has not been taking this or any of her medications other than levothyroxine.        Past Medical History:  Diagnosis Date  . Allergy   . Corn of toe   . First degree AV block   . GERD (gastroesophageal reflux disease)   . Hammer toe of right foot   . Hyperglycemia   . Hyperlipidemia   . Hypertension   . Loss of appetite   . Prolapse of urethra   . Scoliosis (and kyphoscoliosis), idiopathic   . Thyroid disease   . Tinnitus of both ears   . Vitreous floaters of right eye     Patient Active Problem List   Diagnosis Date Noted  . Arrhythmia 08/25/2019  . Rectal bleeding   . Low grade squamous intraepith lesion on cytologic smear cervix (lgsil) 08/04/2018  . Chronic venous insufficiency 07/05/2018  . Lymphedema 07/05/2018  . Hypertension 04/02/2018  . Swelling of limb 04/02/2018  . Depression, major, recurrent, mild  (HCC) 12/29/2017  . Leukopenia 05/24/2017  . Allergic rhinitis, seasonal 06/29/2015  . Clavus 06/29/2015  . 1st degree AV block 06/29/2015  . Hammer toe 06/29/2015  . H/O: HTN (hypertension) 06/29/2015  . Blood glucose elevated 06/29/2015  . Floater, vitreous 06/29/2015  . Bilateral tinnitus 06/29/2015  . Hypothyroidism (acquired) 04/02/2015  . Gastroesophageal reflux disease 04/02/2015  . Hammer toe of right foot 04/02/2015  . Scoliosis of thoracic spine 04/02/2015  . Urethral prolapse 04/02/2015  . Corn of toe 04/02/2015  . Hyperlipidemia 04/02/2015  . Varicose veins of both lower extremities 04/02/2015    Past Surgical History:  Procedure Laterality Date  . ABDOMINAL HYSTERECTOMY  1989  . APPENDECTOMY  1989  . BREAST EXCISIONAL BIOPSY Bilateral    multiple biopsies negative  . BREAST SURGERY     Several  . COLONOSCOPY WITH PROPOFOL N/A 07/28/2019   Procedure: COLONOSCOPY WITH PROPOFOL;  Surgeon: Toney Reil, MD;  Location: [facility] ENDOSCOPY;  Service: Gastroenterology;  Laterality: N/A;  . TUMOR EXCISION  1989   same time as hysterectomy    Prior to Admission medications   Medication Sig Start Date End Date Taking? Authorizing Provider  acetaminophen (TYLENOL) 325 MG tablet Take 2 tablets (650 mg total) by mouth every 6 (six) hours as needed for mild pain (or Fever >/= 101). 08/26/19   Ramonita Lab, MD  apixaban (ELIQUIS) 5 MG TABS tablet  Take 1 tablet (5 mg total) by mouth 2 (two) times daily. 08/26/19   Ramonita Lab, MD  Ascorbic Acid (VITAMIN C) 1000 MG tablet Take 1,000 mg by mouth daily.    [provider]  conjugated estrogens (PREMARIN) vaginal cream Place 1 Applicatorful vaginally daily. 01/11/19   Alba Cory, MD  docusate sodium (COLACE) 100 MG capsule Take 1 capsule (100 mg total) by mouth daily as needed for mild constipation. 08/26/19   Ramonita Lab, MD  famotidine (PEPCID) 40 MG tablet Take 1 tablet (40 mg total) by mouth daily. 01/11/19    Alba Cory, MD  fluticasone (FLONASE) 50 MCG/ACT nasal spray USE 1 SPRAY IN EACH NOSTRIL ONCE DAILY 04/18/19   Alba Cory, MD  Garlic 1000 MG CAPS Take 1 capsule by mouth daily.     [provider]  Iron-Vitamins (GERITOL COMPLETE PO) Take 1 tablet by mouth daily.    [provider]  levothyroxine (SYNTHROID) 88 MCG tablet Take 1 tablet (88 mcg total) by mouth daily before breakfast. 06/27/19   Carlynn Purl, Danna Hefty, MD  metoprolol tartrate (LOPRESSOR) 25 MG tablet Take 0.5 tablets (12.5 mg total) by mouth 2 (two) times daily. 08/26/19   Ramonita Lab, MD  pantoprazole sodium (PROTONIX) 40 mg/20 mL PACK Take 10 mLs (20 mg total) by mouth 2 (two) times daily. 09/09/19   Danelle Berry, PA-C  sucralfate (CARAFATE) 1 GM/10ML suspension Take 10 mLs (1 g total) by mouth 4 (four) times daily -  with meals and at bedtime. 09/09/19   Danelle Berry, PA-C  triamcinolone cream (KENALOG) 0.1 % Apply 1 application topically 2 (two) times daily. 01/11/19   Alba Cory, MD  TURMERIC CURCUMIN PO Take 550 mg by mouth daily.    [provider]    Allergies Aspirin, Citalopram, Codeine, and Penicillins  Family History  Problem Relation Age of Onset  . Diabetes Father 17       DM complications  . Hypertension Mother 55       CKD  . Thyroid disease Mother   . Heart failure Mother   . Kidney disease Mother   . Breast cancer Sister 15  . Kidney disease Other   . Breast cancer Maternal Aunt   . Breast cancer Cousin   . Anuerysm Son 51  . Diabetes Son     Social History Social History   Tobacco Use  . Smoking status: Never Smoker  . Smokeless tobacco: Never Used  . Tobacco comment: smoking cessation materials not required  Substance Use Topics  . Alcohol use: Never    Alcohol/week: 0.0 standard drinks    Frequency: Never  . Drug use: No    Review of Systems  Constitutional: No fever/chills Eyes: No visual changes. ENT: No sore throat. Cardiovascular: Denies chest  pain. Respiratory: Denies shortness of breath. Gastrointestinal: Positive for abdominal pain.  No nausea, no vomiting.  No diarrhea.  No constipation.  Positive for dark stool Genitourinary: Negative for dysuria. Musculoskeletal: Negative for back pain. Skin: Negative for rash. Neurological: Negative for headaches, focal weakness or numbness.  ____________________________________________   PHYSICAL EXAM:  VITAL SIGNS: ED Triage Vitals [09/22/19 1229]  Enc Vitals Group     BP 135/79     Pulse Rate 97     Resp 18     Temp 99.1 F (37.3 C)     Temp Source Oral     SpO2 100 %     Weight      Height  Head Circumference      Peak Flow      Pain Score      Pain Loc      Pain Edu?      Excl. in [place]?     Constitutional: Alert and oriented. Eyes: Conjunctivae are normal. Head: Atraumatic. Nose: No congestion/rhinnorhea. Mouth/Throat: Mucous membranes are moist. Neck: Normal ROM Cardiovascular: Normal rate, regular rhythm. Grossly normal heart sounds. Respiratory: Normal respiratory effort.  No retractions. Lungs CTAB. Gastrointestinal: Soft and nontender. No distention.  Light brown stool that is guaiac negative. Genitourinary: deferred Musculoskeletal: No lower extremity tenderness nor edema. Neurologic:  Normal speech and language. No gross focal neurologic deficits are appreciated. Skin:  Skin is warm, dry and intact. No rash noted. Psychiatric: Mood and affect are normal. Speech and behavior are normal.  ____________________________________________   LABS (all labs ordered are listed, but only abnormal results are displayed)  Labs Reviewed  COMPREHENSIVE METABOLIC PANEL - Abnormal; Notable for the following components:      Result Value   Sodium 133 (*)    Chloride 95 (*)    Glucose, Bld 176 (*)    All other components within normal limits  CBC - Abnormal; Notable for the following components:   WBC 3.8 (*)    All other components within normal limits   MAGNESIUM  TROPONIN I (HIGH SENSITIVITY)   ____________________________________________  EKG  ED ECG REPORT I, Blake Divine, the attending physician, personally viewed and interpreted this ECG.   Date: 09/22/2019  EKG Time: 12:34  Rate: 93  Rhythm: Accelerated junctional rhythm  Axis: Normal  Intervals:none  ST&T Change: None   PROCEDURES  Procedure(s) performed (including Critical Care):  Procedures   ____________________________________________   INITIAL IMPRESSION / ASSESSMENT AND PLAN / ED COURSE       73 year old female with history of GERD and atrial fibrillation presents to the ED following episode of dark stool at home as well as some epigastric discomfort overnight that has since resolved.  She has a benign abdominal exam and has not had any ongoing abdominal pain.  Lab work is reassuring, LFTs unremarkable and hemoglobin stable.  Rectal exam shows light brown guaiac negative stool, doubt significant GI bleeding.  Visualize dark stools likely related to Pepto-Bismol that patient has been taking.  She is noted to have junctional rhythm on EKG as well as cardiac monitor, denies any symptoms related to this.  She had a recent admission for atrial fibrillation as well as junctional rhythm and nonsustained V. tach, plan was to start Eliquis and determine need for amiodarone and sotalol at that time, however she has not followed up with cardiology.  Patient is now adamant that she does not want to take blood thinners or any other medications for her heart.  Explained to her that this puts her at high risk for stroke as well as life-threatening arrhythmia.  Patient expresses understanding and still is adamant that she does not want to take blood thinners or other rate or rhythm controlling medications.  Case discussed with Dr. Saralyn Pilar of cardiology, no acute intervention needed for patient's accelerated junctional rhythm.  Do not suspect acute ischemia, troponin within  normal limits.  Counseled patient on need to follow-up with cardiology and otherwise return to the ED for new or worsening symptoms.  Patient agrees with plan.      ____________________________________________   FINAL CLINICAL IMPRESSION(S) / ED DIAGNOSES  Final diagnoses:  Gastroesophageal reflux disease, unspecified whether esophagitis present  Accelerated junctional rhythm  History of atrial fibrillation     ED Discharge Orders    None       Note:  This document was prepared using Dragon voice recognition software and may include unintentional dictation errors.   Chesley NoonJessup, Paisleigh Maroney, MD 09/22/19 1455

## 2019-09-22 NOTE — ED Notes (Signed)

## 2019-09-24 ENCOUNTER — Emergency Department: Payer: Medicare HMO

## 2019-09-24 ENCOUNTER — Emergency Department
Admission: EM | Admit: 2019-09-24 | Discharge: 2019-09-24 | Disposition: A | Discharge status: Home / Self Care | Payer: Medicare HMO | Attending: Emergency Medicine | Admitting: Emergency Medicine

## 2019-09-24 ENCOUNTER — Encounter: Payer: Self-pay | Admitting: Emergency Medicine

## 2019-09-24 ENCOUNTER — Other Ambulatory Visit: Payer: Self-pay

## 2019-09-24 DIAGNOSIS — R0989 Other specified symptoms and signs involving the circulatory and respiratory systems: Secondary | ICD-10-CM | POA: Diagnosis not present

## 2019-09-24 DIAGNOSIS — Z7901 Long term (current) use of anticoagulants: Secondary | ICD-10-CM | POA: Insufficient documentation

## 2019-09-24 DIAGNOSIS — R1310 Dysphagia, unspecified: Secondary | ICD-10-CM | POA: Diagnosis not present

## 2019-09-24 DIAGNOSIS — E039 Hypothyroidism, unspecified: Secondary | ICD-10-CM | POA: Diagnosis not present

## 2019-09-24 DIAGNOSIS — R093 Abnormal sputum: Secondary | ICD-10-CM | POA: Diagnosis not present

## 2019-09-24 DIAGNOSIS — Z79899 Other long term (current) drug therapy: Secondary | ICD-10-CM | POA: Diagnosis not present

## 2019-09-24 DIAGNOSIS — R05 Cough: Secondary | ICD-10-CM | POA: Diagnosis not present

## 2019-09-24 LAB — COMPREHENSIVE METABOLIC PANEL
ALT: 23 U/L (ref 0–44)
AST: 37 U/L (ref 15–41)
Albumin: 4.7 g/dL (ref 3.5–5.0)
Alkaline Phosphatase: 68 U/L (ref 38–126)
Anion gap: 16 — ABNORMAL HIGH (ref 5–15)
BUN: 9 mg/dL (ref 8–23)
CO2: 23 mmol/L (ref 22–32)
Calcium: 9.2 mg/dL (ref 8.9–10.3)
Chloride: 94 mmol/L — ABNORMAL LOW (ref 98–111)
Creatinine, Ser: 0.7 mg/dL (ref 0.44–1.00)
GFR calc Af Amer: >60 mL/min (ref >60)
GFR calc non Af Amer: >60 mL/min (ref >60)
Glucose, Bld: 69 mg/dL — ABNORMAL LOW (ref 70–99)
Potassium: 4.2 mmol/L (ref 3.5–5.1)
Sodium: 133 mmol/L — ABNORMAL LOW (ref 135–145)
Total Bilirubin: 1.4 mg/dL — ABNORMAL HIGH (ref 0.3–1.2)
Total Protein: 8.3 g/dL — ABNORMAL HIGH (ref 6.5–8.1)

## 2019-09-24 LAB — CBC
HCT: 39.7 % (ref 36.0–46.0)
Hemoglobin: 13.7 g/dL (ref 12.0–15.0)
MCH: 29 pg (ref 26.0–34.0)
MCHC: 34.5 g/dL (ref 30.0–36.0)
MCV: 84.1 fL (ref 80.0–100.0)
Platelets: 210 10*3/uL (ref 150–400)
RBC: 4.72 MIL/uL (ref 3.87–5.11)
RDW: 14.6 % (ref 11.5–15.5)
WBC: 4.5 10*3/uL (ref 4.0–10.5)
nRBC: 0 % (ref 0.0–0.2)

## 2019-09-24 MED ORDER — IOHEXOL 300 MG/ML  SOLN
75.0000 mL | Freq: Once | INTRAMUSCULAR | Status: AC | PRN
  Administered 2019-09-24: 75 mL via INTRAVENOUS

## 2019-09-24 MED ORDER — GUAIFENESIN ER 600 MG PO TB12: 600.0000 mg | ORAL_TABLET | Freq: Two times a day (BID) | ORAL | 0 refills | Status: DC

## 2019-09-24 NOTE — ED Provider Notes (Signed)
[facility] Emergency Department Provider Note  Time seen: 4:12 PM  I have reviewed the triage vital signs and the nursing notes.   HISTORY  Chief Complaint Dysphagia   HPI Maureen [name] is a 73 y.o. female with a past medical history of gastric reflux, hypertension, hyperlipidemia, hyperglycemia, presents to the emergency department for difficulty swallowing.  According to the patient for the past 2 to 3 days she is felt like something is in her throat has been having difficulty swallowing.  States it feels like something is pushing on her throat or that there is phlegm in her throat.  Denies any fever.  Denies any cough states she feels she has to continuously clear her throat.  States she has been having some difficulty swallowing due to this as well but states this is an ongoing issue as well due to her partial dentures not fitting well and not being able to chew her food well per patient.  Denies any difficulty swallowing water or soft foods.   Past Medical History:  Diagnosis Date  . Allergy   . Corn of toe   . First degree AV block   . GERD (gastroesophageal reflux disease)   . Hammer toe of right foot   . Hyperglycemia   . Hyperlipidemia   . Hypertension   . Loss of appetite   . Prolapse of urethra   . Scoliosis (and kyphoscoliosis), idiopathic   . Thyroid disease   . Tinnitus of both ears   . Vitreous floaters of right eye     Patient Active Problem List   Diagnosis Date Noted  . Arrhythmia 08/25/2019  . Rectal bleeding   . Low grade squamous intraepith lesion on cytologic smear cervix (lgsil) 08/04/2018  . Chronic venous insufficiency 07/05/2018  . Lymphedema 07/05/2018  . Hypertension 04/02/2018  . Swelling of limb 04/02/2018  . Depression, major, recurrent, mild (HCC) 12/29/2017  . Leukopenia 05/24/2017  . Allergic rhinitis, seasonal 06/29/2015  . Clavus 06/29/2015  . 1st degree AV block 06/29/2015  . Hammer toe 06/29/2015  . H/O:  HTN (hypertension) 06/29/2015  . Blood glucose elevated 06/29/2015  . Floater, vitreous 06/29/2015  . Bilateral tinnitus 06/29/2015  . Hypothyroidism (acquired) 04/02/2015  . Gastroesophageal reflux disease 04/02/2015  . Hammer toe of right foot 04/02/2015  . Scoliosis of thoracic spine 04/02/2015  . Urethral prolapse 04/02/2015  . Corn of toe 04/02/2015  . Hyperlipidemia 04/02/2015  . Varicose veins of both lower extremities 04/02/2015    Past Surgical History:  Procedure Laterality Date  . ABDOMINAL HYSTERECTOMY  1989  . APPENDECTOMY  1989  . BREAST EXCISIONAL BIOPSY Bilateral    multiple biopsies negative  . BREAST SURGERY     Several  . COLONOSCOPY WITH PROPOFOL N/A 07/28/2019   Procedure: COLONOSCOPY WITH PROPOFOL;  Surgeon: Toney Reil, MD;  Location: [facility] ENDOSCOPY;  Service: Gastroenterology;  Laterality: N/A;  . TUMOR EXCISION  1989   same time as hysterectomy    Prior to Admission medications   Medication Sig Start Date End Date Taking? Authorizing Provider  acetaminophen (TYLENOL) 325 MG tablet Take 2 tablets (650 mg total) by mouth every 6 (six) hours as needed for mild pain (or Fever >/= 101). 08/26/19   Ramonita Lab, MD  apixaban (ELIQUIS) 5 MG TABS tablet Take 1 tablet (5 mg total) by mouth 2 (two) times daily. 08/26/19   Ramonita Lab, MD  Ascorbic Acid (VITAMIN C) 1000 MG tablet Take 1,000 mg by mouth  daily.    [provider]  conjugated estrogens (PREMARIN) vaginal cream Place 1 Applicatorful vaginally daily. 01/11/19   Steele Sizer, MD  docusate sodium (COLACE) 100 MG capsule Take 1 capsule (100 mg total) by mouth daily as needed for mild constipation. 08/26/19   Nicholes Mango, MD  famotidine (PEPCID) 40 MG tablet Take 1 tablet (40 mg total) by mouth daily. 01/11/19   Steele Sizer, MD  fluticasone (FLONASE) 50 MCG/ACT nasal spray USE 1 SPRAY IN EACH NOSTRIL ONCE DAILY 04/18/19   Steele Sizer, MD  Garlic 2119 MG CAPS Take 1 capsule by mouth  daily.     [provider]  Iron-Vitamins (GERITOL COMPLETE PO) Take 1 tablet by mouth daily.    [provider]  levothyroxine (SYNTHROID) 88 MCG tablet Take 1 tablet (88 mcg total) by mouth daily before breakfast. 06/27/19   Ancil Boozer, Drue Stager, MD  metoprolol tartrate (LOPRESSOR) 25 MG tablet Take 0.5 tablets (12.5 mg total) by mouth 2 (two) times daily. 08/26/19   Nicholes Mango, MD  pantoprazole sodium (PROTONIX) 40 mg/20 mL PACK Take 10 mLs (20 mg total) by mouth 2 (two) times daily. 09/09/19   Delsa Grana, PA-C  sucralfate (CARAFATE) 1 GM/10ML suspension Take 10 mLs (1 g total) by mouth 4 (four) times daily -  with meals and at bedtime. 09/09/19   Delsa Grana, PA-C  triamcinolone cream (KENALOG) 0.1 % Apply 1 application topically 2 (two) times daily. 01/11/19   Steele Sizer, MD  TURMERIC CURCUMIN PO Take 550 mg by mouth daily.    [provider]    Allergies  Allergen Reactions  . Aspirin     Tinnitus  . Citalopram Other (See Comments)  . Codeine Nausea And Vomiting  . Penicillins Rash    Has patient had a PCN reaction causing immediate rash, facial/tongue/throat swelling, SOB or lightheadedness with hypotension: Yes Has patient had a PCN reaction causing severe rash involving mucus membranes or skin necrosis: No Has patient had a PCN reaction that required hospitalization No Has patient had a PCN reaction occurring within the last 10 years: No If all of the above answers are "NO", then may proceed with Cephalosporin use.    Family History  Problem Relation Age of Onset  . Diabetes Father 24       DM complications  . Hypertension Mother 18       CKD  . Thyroid disease Mother   . Heart failure Mother   . Kidney disease Mother   . Breast cancer Sister 81  . Kidney disease Other   . Breast cancer Maternal Aunt   . Breast cancer Cousin   . Anuerysm Son 52  . Diabetes Son     Social History Social History   Tobacco Use  . Smoking status: Never  Smoker  . Smokeless tobacco: Never Used  . Tobacco comment: smoking cessation materials not required  Substance Use Topics  . Alcohol use: Never    Alcohol/week: 0.0 standard drinks    Frequency: Never  . Drug use: No    Review of Systems Constitutional: Negative for fever. ENT: Fullness in her throat, phlegm. Cardiovascular: Negative for chest pain. Respiratory: Negative for shortness of breath.  Negative for cough.  Has to clear her throat often. Gastrointestinal: Negative for abdominal pain.  Denies vomiting. Neurological: Negative for headache All other ROS negative  ____________________________________________   PHYSICAL EXAM:  VITAL SIGNS: ED Triage Vitals  Enc Vitals Group     BP 09/24/19 1349 (!)  145/82     Pulse Rate 09/24/19 1349 (!) 110     Resp 09/24/19 1349 16     Temp 09/24/19 1349 99.1 F (37.3 C)     Temp Source 09/24/19 1349 Oral     SpO2 09/24/19 1349 100 %     Weight 09/24/19 1349 120 lb (54.4 kg)     Height 09/24/19 1349 5\' 3"  (1.6 m)     Head Circumference --      Peak Flow --      Pain Score 09/24/19 1400 0     Pain Loc --      Pain Edu? --      Excl. in [place]? --     Constitutional: Alert and oriented. Well appearing and in no distress. Eyes: Normal exam ENT      Head: Normocephalic and atraumatic.      Mouth/Throat: Mucous membranes are moist. Cardiovascular: Normal rate, regular rhythm.  Respiratory: Normal respiratory effort without tachypnea nor retractions. Breath sounds are clear  Gastrointestinal: Soft and nontender. No distention.   Musculoskeletal: Nontender with normal range of motion in all extremities. Neurologic:  Normal speech and language. No gross focal neurologic deficits Skin:  Skin is warm, dry and intact.  Psychiatric: Mood and affect are normal.   ____________________________________________   INITIAL IMPRESSION / ASSESSMENT AND PLAN / ED COURSE  Pertinent labs & imaging results that were available during my care  of the patient were reviewed by me and considered in my medical decision making (see chart for details).   Patient presents to the emergency department for feeling of phlegm in her throat or like something is pushing on her airway per patient.  Has been experiencing difficulty swallowing as well although this appears to be somewhat chronic.  Patient does frequently clear her throat in the emergency department.  Denies any sore throat.  Does have a borderline low-grade temperature 99.1.  Denies any cough or shortness of breath.  Given the patient's symptoms we will proceed with CT scan of the neck to rule out mass/tumor.  Will obtain chest x-ray as well.  Overall patient appears well.  Lab work is largely within normal limits besides slight dehydration.  Chest x-ray is negative. CT neck is negative.  We will discharge patient have her follow-up with her doctor.  Patient agreeable to plan of care.  Maureen [name] was evaluated in Emergency Department on 09/24/2019 for the symptoms described in the history of present illness. She was evaluated in the context of the global COVID-19 pandemic, which necessitated consideration that the patient might be at risk for infection with the SARS-CoV-2 virus that causes COVID-19. Institutional protocols and algorithms that pertain to the evaluation of patients at risk for COVID-19 are in a state of rapid change based on information released by regulatory bodies including the CDC and federal and state organizations. These policies and algorithms were followed during the patient's care in the ED.  ____________________________________________   FINAL CLINICAL IMPRESSION(S) / ED DIAGNOSES  Throat fullness   Minna AntisPaduchowski, Beryl Balz, MD 09/24/19 1857

## 2019-09-24 NOTE — ED Notes (Signed)
Pt up to restroom.

## 2019-09-24 NOTE — ED Triage Notes (Signed)
States has had some trouble swallowing x 1 month. Demonstrates a sound like clearing her throat but states she does not have phlegm.

## 2019-09-25 DIAGNOSIS — Z886 Allergy status to analgesic agent status: Secondary | ICD-10-CM | POA: Diagnosis not present

## 2019-09-25 DIAGNOSIS — K089 Disorder of teeth and supporting structures, unspecified: Secondary | ICD-10-CM | POA: Diagnosis not present

## 2019-09-25 DIAGNOSIS — I10 Essential (primary) hypertension: Secondary | ICD-10-CM | POA: Diagnosis not present

## 2019-09-25 DIAGNOSIS — E785 Hyperlipidemia, unspecified: Secondary | ICD-10-CM | POA: Diagnosis not present

## 2019-09-25 DIAGNOSIS — Z888 Allergy status to other drugs, medicaments and biological substances status: Secondary | ICD-10-CM | POA: Diagnosis not present

## 2019-09-25 DIAGNOSIS — R633 Feeding difficulties: Secondary | ICD-10-CM | POA: Diagnosis not present

## 2019-09-25 DIAGNOSIS — Z885 Allergy status to narcotic agent status: Secondary | ICD-10-CM | POA: Diagnosis not present

## 2019-09-25 DIAGNOSIS — I4891 Unspecified atrial fibrillation: Secondary | ICD-10-CM | POA: Diagnosis not present

## 2019-09-25 DIAGNOSIS — Z880 Allergy status to penicillin: Secondary | ICD-10-CM | POA: Diagnosis not present

## 2019-09-25 DIAGNOSIS — E860 Dehydration: Secondary | ICD-10-CM | POA: Diagnosis not present

## 2019-09-27 ENCOUNTER — Telehealth: Payer: Self-pay | Admitting: Family Medicine

## 2019-09-27 NOTE — Telephone Encounter (Signed)
Pt had an appt for tomorrow but wanted to cancel her appt because she was weak and not feeling well. Stated that she has not had a bowel movement in a very long time. When I asked her how long it has been she could not remember. She is not able to eat. I convinced her to keep her appt for tomorrow and just do a telephone visit that way she can talk to Dr Ancil Boozer about her situation, will call her if we get a cancellation today. She wanted me to let Dr Ancil Boozer know what is going on. She think it is more than just acid reflux

## 2019-09-27 NOTE — Telephone Encounter (Signed)
Pt called and asked to speak with Cassandra.  No answer at office.

## 2019-09-27 NOTE — Telephone Encounter (Signed)
Spoke with pt and she wanted to give [place] authorization to speak with her son. He will be there with her when she have her virtual appt. I told her that he can be there but she will have to sign an dpr the next time she comes in in order for any of [place] to speak to him if he had to call in on her behalf. Pt verbalized understanding.

## 2019-09-28 ENCOUNTER — Encounter: Payer: Self-pay | Admitting: Family Medicine

## 2019-09-28 ENCOUNTER — Other Ambulatory Visit: Payer: Self-pay

## 2019-09-28 ENCOUNTER — Ambulatory Visit: Payer: Medicare HMO | Admitting: Family Medicine

## 2019-09-28 ENCOUNTER — Ambulatory Visit (INDEPENDENT_AMBULATORY_CARE_PROVIDER_SITE_OTHER): Payer: Medicare HMO | Admitting: Family Medicine

## 2019-09-28 DIAGNOSIS — K2100 Gastro-esophageal reflux disease with esophagitis, without bleeding: Secondary | ICD-10-CM | POA: Diagnosis not present

## 2019-09-28 DIAGNOSIS — F331 Major depressive disorder, recurrent, moderate: Secondary | ICD-10-CM | POA: Diagnosis not present

## 2019-09-28 DIAGNOSIS — I480 Paroxysmal atrial fibrillation: Secondary | ICD-10-CM

## 2019-09-28 DIAGNOSIS — I4729 Other ventricular tachycardia: Secondary | ICD-10-CM

## 2019-09-28 DIAGNOSIS — I472 Ventricular tachycardia: Secondary | ICD-10-CM

## 2019-09-28 NOTE — Progress Notes (Signed)
Name: Maureen [name]   MRN: [id number]    DOB: 18-Sep-1946   Date:09/28/2019       Progress Note  Subjective  Chief Complaint  Chief Complaint  Patient presents with  . Medication Refill  . Hypothyroidism  . Gastroesophageal Reflux    Has been off and on   . Other neutropenia (HCC)  . Mixed hyperlipidemia  . Grieving    I connected with  Maureen [name] on 09/28/19 at 11:20 AM EST by telephone and verified that I am speaking with the correct person using two identifiers.  I discussed the limitations, risks, security and privacy concerns of performing an evaluation and management service by telephone and the availability of in person appointments. Staff also discussed with the patient that there may be a patient responsible charge related to this service. Patient Location: at home  Provider Location: [facility]   HPI  Depression  shehas significant family history mother and sister that had to be hospitalized for major depression . Personally she had been depressed on and off all her life, severe symptoms a few years ago when she lost a lot of weight , her weight was down 119 lbs , she was given multiple medications in the past including Remeron. She states 2020 has been hard, lost loved ones ( a friend and nephew ), social isolations has been hard. She came in to see me about 6 weeks ago because her children noticed that she is not as engaged. She sounded very tearful during our phone conversation today and seemed in a rush to hang up. It was difficulty to get a good history. She kept apologizing for not taking medications. She is upset about her tooth problems but did not elaborate. She denied suicidal thoughts or ideation. Asked her to come in person in 2 weeks for follow up or sooner if possible, asked her to discuss it with her children . She states they know. She spent Thanksgiving with them  Cardiac Arrhythmia: she went to [facility] on 08/25/2019 with palpitation and was  admitted with junctional rhythm and well as some atrial fibrillation and nonsustained ventricular tachycardia. She was advised to stop Zoloft upon discharge. She went back to [facility] on 09/06/2019 with chest pain and diagnosed with GERD and again on 09/25/2019 . She is usually very healthy. She was getting confused about when and what was the reason for [facility] visits. Discussed referrals and she agreed on going. She was supposed to be taking Metoprolol ( when discharged from [facility]) and also sucralfate and pantoprazole but she stopped all medications on her own  Patient Active Problem List   Diagnosis Date Noted  . Arrhythmia 08/25/2019  . Rectal bleeding   . Low grade squamous intraepith lesion on cytologic smear cervix (lgsil) 08/04/2018  . Chronic venous insufficiency 07/05/2018  . Lymphedema 07/05/2018  . Hypertension 04/02/2018  . Swelling of limb 04/02/2018  . Depression, major, recurrent, mild (HCC) 12/29/2017  . Leukopenia 05/24/2017  . Allergic rhinitis, seasonal 06/29/2015  . Clavus 06/29/2015  . 1st degree AV block 06/29/2015  . Hammer toe 06/29/2015  . H/O: HTN (hypertension) 06/29/2015  . Blood glucose elevated 06/29/2015  . Floater, vitreous 06/29/2015  . Bilateral tinnitus 06/29/2015  . Hypothyroidism (acquired) 04/02/2015  . Gastroesophageal reflux disease 04/02/2015  . Hammer toe of right foot 04/02/2015  . Scoliosis of thoracic spine 04/02/2015  . Urethral prolapse 04/02/2015  . Corn of toe 04/02/2015  . Hyperlipidemia 04/02/2015  . Varicose veins  of both lower extremities 04/02/2015    Past Surgical History:  Procedure Laterality Date  . ABDOMINAL HYSTERECTOMY  1989  . APPENDECTOMY  1989  . BREAST EXCISIONAL BIOPSY Bilateral    multiple biopsies negative  . BREAST SURGERY     Several  . COLONOSCOPY WITH PROPOFOL N/A 07/28/2019   Procedure: COLONOSCOPY WITH PROPOFOL;  Surgeon: Toney ReilVanga, Rohini Reddy, MD;  [facility];  Service: Gastroenterology;  Laterality:  N/A;  . TUMOR EXCISION  1989   same time as hysterectomy    Family History  Problem Relation Age of Onset  . Diabetes Father 9276       DM complications  . Hypertension Mother 3179       CKD  . Thyroid disease Mother   . Heart failure Mother   . Kidney disease Mother   . Breast cancer Sister 2053  . Kidney disease Other   . Breast cancer Maternal Aunt   . Breast cancer Cousin   . Anuerysm Son 51  . Diabetes Son     Social History   Socioeconomic History  . Marital status: Widowed    Spouse name: Not on file  . Number of children: 3  . Years of education: 2212  . Highest education level: 12th grade  Occupational History  . Occupation: Retired  Engineer, productionocial Needs  . Financial resource strain: Not hard at all  . Food insecurity    Worry: Never true    Inability: Never true  . Transportation needs    Medical: No    Non-medical: No  Tobacco Use  . Smoking status: Never Smoker  . Smokeless tobacco: Never Used  . Tobacco comment: smoking cessation materials not required  Substance and Sexual Activity  . Alcohol use: Never    Alcohol/week: 0.0 standard drinks    Frequency: Never  . Drug use: No  . Sexual activity: Not Currently    Partners: Male  Lifestyle  . Physical activity    Days per week: 3 days    Minutes per session: 30 min  . Stress: Not at all  Relationships  . Social connections    Talks on phone: More than three times a week    Gets together: More than three times a week    Attends religious service: More than 4 times per year    Active member of club or organization: Yes    Attends meetings of clubs or organizations: More than 4 times per year    Relationship status: Widowed  . Intimate partner violence    Fear of current or ex partner: No    Emotionally abused: No    Physically abused: No    Forced sexual activity: No  Other Topics Concern  . Not on file  Social History Narrative  . Not on file     Current Outpatient Medications:  .  acetaminophen  (TYLENOL) 325 MG tablet, Take 2 tablets (650 mg total) by mouth every 6 (six) hours as needed for mild pain (or Fever >/= 101)., Disp:  , Rfl:  .  apixaban (ELIQUIS) 5 MG TABS tablet, Take 1 tablet (5 mg total) by mouth 2 (two) times daily., Disp: 60 tablet, Rfl: 0 .  Ascorbic Acid (VITAMIN C) 1000 MG tablet, Take 1,000 mg by mouth daily., Disp: , Rfl:  .  chlorhexidine (PERIDEX) 0.12 % solution, SMARTSIG:0.5 Ounce(s) By Mouth Twice Daily, Disp: , Rfl:  .  conjugated estrogens (PREMARIN) vaginal cream, Place 1 Applicatorful vaginally daily., Disp: 42.5 g,  Rfl: 12 .  famotidine (PEPCID) 40 MG tablet, Take 1 tablet (40 mg total) by mouth daily., Disp: 90 tablet, Rfl: 0 .  fluticasone (FLONASE) 50 MCG/ACT nasal spray, USE 1 SPRAY IN EACH NOSTRIL ONCE DAILY, Disp: 16 g, Rfl: 0 .  Garlic 1000 MG CAPS, Take 1 capsule by mouth daily. , Disp: , Rfl:  .  Iron-Vitamins (GERITOL COMPLETE PO), Take 1 tablet by mouth daily., Disp: , Rfl:  .  levothyroxine (SYNTHROID) 88 MCG tablet, Take 1 tablet (88 mcg total) by mouth daily before breakfast., Disp: 90 tablet, Rfl: 0 .  docusate sodium (COLACE) 100 MG capsule, Take 1 capsule (100 mg total) by mouth daily as needed for mild constipation. (Patient not taking: Reported on 09/28/2019), Disp: 10 capsule, Rfl: 0 .  guaiFENesin (MUCINEX) 600 MG 12 hr tablet, Take 1 tablet (600 mg total) by mouth 2 (two) times daily for 15 days. (Patient not taking: Reported on 09/28/2019), Disp: 30 tablet, Rfl: 0 .  metoprolol tartrate (LOPRESSOR) 25 MG tablet, Take 0.5 tablets (12.5 mg total) by mouth 2 (two) times daily. (Patient not taking: Reported on 09/28/2019), Disp: 60 tablet, Rfl: 0 .  pantoprazole sodium (PROTONIX) 40 mg/20 mL PACK, Take 10 mLs (20 mg total) by mouth 2 (two) times daily. (Patient not taking: Reported on 09/28/2019), Disp: 280 mL, Rfl: 1 .  sucralfate (CARAFATE) 1 GM/10ML suspension, Take 10 mLs (1 g total) by mouth 4 (four) times daily -  with meals and at bedtime.  (Patient not taking: Reported on 09/28/2019), Disp: 420 mL, Rfl: 0 .  triamcinolone cream (KENALOG) 0.1 %, Apply 1 application topically 2 (two) times daily. (Patient not taking: Reported on 09/28/2019), Disp: 45 g, Rfl: 0 .  TURMERIC CURCUMIN PO, Take 550 mg by mouth daily., Disp: , Rfl:   Allergies  Allergen Reactions  . Aspirin     Tinnitus  . Citalopram Other (See Comments)  . Codeine Nausea And Vomiting  . Penicillins Rash    Has patient had a PCN reaction causing immediate rash, facial/tongue/throat swelling, SOB or lightheadedness with hypotension: Yes Has patient had a PCN reaction causing severe rash involving mucus membranes or skin necrosis: No Has patient had a PCN reaction that required hospitalization No Has patient had a PCN reaction occurring within the last 10 years: No If all of the above answers are "NO", then may proceed with Cephalosporin use.    I personally reviewed active problem list, medication list, allergies, family history with the patient/caregiver today.   ROS  Ten systems reviewed and is negative except as mentioned in HPI   Objective  Virtual encounter, vitals not obtained.  There is no height or weight on file to calculate BMI.  Physical Exam  Awake, alert and oriented   PHQ2/9: Depression screen [facility] 2/9 09/28/2019 09/09/2019 08/17/2019 08/05/2019 06/27/2019  Decreased Interest 0 0 2 0 0  Down, Depressed, Hopeless 1 0 2 1 2   PHQ - 2 Score 1 0 4 1 2   Altered sleeping 1 0 2 0 1  Tired, decreased energy 3 0 2 0 0  Change in appetite 3 0 2 0 0  Feeling bad or failure about yourself  0 0 0 0 0  Trouble concentrating 3 0 0 0 0  Moving slowly or fidgety/restless 0 0 0 0 0  Suicidal thoughts 0 0 0 0 0  PHQ-9 Score 11 0 10 1 3   Difficult doing work/chores Somewhat difficult Not difficult at all Not difficult at all  Not difficult at all Somewhat difficult  Some recent data might be hidden   PHQ-2/9 Result is positive.    Fall Risk: Fall Risk   09/28/2019 09/09/2019 08/05/2019 06/27/2019 01/11/2019  Falls in the past year? 0 0 0 0 0  Number falls in past yr: 0 0 0 0 0  Injury with Fall? 0 0 0 0 0  Risk for fall due to : - - - - -  Risk for fall due to: Comment - - - - -  Follow up - Falls evaluation completed Falls prevention discussed - -     Assessment & Plan  1. Moderate episode of recurrent major depressive disorder [facility])  Discussed importance of follow up in person, also asked if she feels like she needs to be admitted but she states she doesn't and is not suicidal   2. Gastroesophageal reflux disease with esophagitis without hemorrhage  She stopped PPI, H2 blockers and sucralfate on her own, willing to see GI  3. Paroxysmal atrial fibrillation (HCC)  Diagnosed during hospital stay, not taking beta blocker not on blood thinners, willing to see cardiologist   4. Ventricular tachycardia, non-sustained (HCC)  Not able to get vital signs, virtual visit   I discussed the assessment and treatment plan with the patient. The patient was provided an opportunity to ask questions and all were answered. The patient agreed with the plan and demonstrated an understanding of the instructions.   The patient was advised to call back or seek an in-person evaluation if the symptoms worsen or if the condition fails to improve as anticipated.  I provided 25  minutes of non-face-to-face time during this encounter.  Loistine Chance, MD

## 2019-09-29 ENCOUNTER — Telehealth: Payer: Self-pay | Admitting: Family Medicine

## 2019-09-29 DIAGNOSIS — F331 Major depressive disorder, recurrent, moderate: Secondary | ICD-10-CM

## 2019-09-29 DIAGNOSIS — R4586 Emotional lability: Secondary | ICD-10-CM

## 2019-09-29 DIAGNOSIS — F4321 Adjustment disorder with depressed mood: Secondary | ICD-10-CM

## 2019-09-29 NOTE — Telephone Encounter (Signed)
Spoke to Mr. Maureen [name], he is very worried about his mother, she has gone to [facility] and had normal swallow test, also had CT, continues to only eat eggs, mash potatoes and Ensure. They tried to give her pureed food but she states it gets stuck in her throat. She is also crying, does not like talking about her emotions and they think something is telling her she is not able to eat. She has a history of depression but has refused medication in the past.  They will take her to [facility] tomorrow, also discussed importance of taking her to [facility] if suicidal thoughts or ideation He told me she rushed me off the phone yesterday because he was trying to go in her house but forgot his keys Patient allowed me to speak to her son today and gave him his number prior to my conversation with him.  [phone]

## 2019-09-29 NOTE — Telephone Encounter (Signed)
Son calling this am to ask if the dr would do urgent referral to somewhere to help the pt with her immediate issue. He states pt thinks something is in her throat, and she does not want to eat because she thinks she cannot swallow.  He states something in her mind that telling her she cannot eat. He states pt has been to the ED 3 X in 3 weeks.  He request referral for in patient or counselor asap.  He did not know she was on phone yesterday for her phone visit.  He states that is not what they had agreed.  Son, Hillery Hunter is on the [place], and request help right away.

## 2019-09-29 NOTE — Telephone Encounter (Unsigned)
Copied from [place] [phone]. Topic: General - Inquiry >> Sep 28, 2019  3:52 PM Oneta Rack wrote: Patient would like Cassandra to call her she said its an emergency and didn't want to elaborate. Attempted to reach practice

## 2019-10-03 ENCOUNTER — Encounter: Payer: Self-pay | Admitting: *Deleted

## 2019-10-03 NOTE — Telephone Encounter (Signed)
Son calling back again to ask for referral for the pt. Pt needs referral to [facility] clinic walk in mental health.  He states they did go to [facility]. But needs the referral asap.   Tommye Standard [phone]

## 2019-10-06 NOTE — Telephone Encounter (Signed)
Spoke to patient's son Maureen [name] about referral. He said he does not think Maureen [name] needs a referral for mental health. He said all of her problems that she is having is related to her teeth. She is seeing a specialist in regards to her dentures, but fells to realize that it is a process and things will not be fixed over night. Her dentist has reassured her that she is there for her and she is committed to working with her until things are fixed correctly. She will make a effort to try and eat because she feels like they still did not fix them right. Her son would like you to call her and encourage her to try and eat and make sure she has eaten something today. He said he has tried to encourage her and tell her it is a process, this is not going to be fixed overnight. He thinks that if she hears it from you she might give it a little more effort.

## 2019-10-20 ENCOUNTER — Encounter: Payer: Self-pay | Admitting: Family Medicine

## 2019-10-20 ENCOUNTER — Other Ambulatory Visit: Payer: Self-pay

## 2019-10-20 ENCOUNTER — Ambulatory Visit (INDEPENDENT_AMBULATORY_CARE_PROVIDER_SITE_OTHER): Payer: Medicare HMO | Admitting: Family Medicine

## 2019-10-20 VITALS — BP 136/77 | HR 109 | Temp 98.6°F | Resp 18 | Ht 64.0 in | Wt 119.1 lb

## 2019-10-20 DIAGNOSIS — Z972 Presence of dental prosthetic device (complete) (partial): Secondary | ICD-10-CM

## 2019-10-20 DIAGNOSIS — F330 Major depressive disorder, recurrent, mild: Secondary | ICD-10-CM | POA: Diagnosis not present

## 2019-10-20 DIAGNOSIS — K0889 Other specified disorders of teeth and supporting structures: Secondary | ICD-10-CM | POA: Diagnosis not present

## 2019-10-20 DIAGNOSIS — E039 Hypothyroidism, unspecified: Secondary | ICD-10-CM | POA: Diagnosis not present

## 2019-10-20 DIAGNOSIS — E871 Hypo-osmolality and hyponatremia: Secondary | ICD-10-CM | POA: Diagnosis not present

## 2019-10-20 DIAGNOSIS — E440 Moderate protein-calorie malnutrition: Secondary | ICD-10-CM | POA: Diagnosis not present

## 2019-10-20 DIAGNOSIS — I480 Paroxysmal atrial fibrillation: Secondary | ICD-10-CM

## 2019-10-20 MED ORDER — MIRTAZAPINE 15 MG PO TABS: 15.0000 mg | ORAL_TABLET | Freq: Every day | ORAL | 0 refills | Status: DC

## 2019-10-20 NOTE — Progress Notes (Signed)
Name: Maureen [name]   MRN: [id number]    DOB: 1946-07-12   Date:10/20/2019       Progress Note  Subjective  Chief Complaint  Chief Complaint  Patient presents with  . Follow-up    Unable to eat due to denture pain     HPI  MDD: she has a long history of depression, son came in with her and states he doesn't think depression is the reason for her weight loss, but he is worried because she has been only eating a very restrictive diet since problems with dentures. Explained that the fact that she did not keep follow up with cardiologist, not eating, losing a significant amount of weight, at times not taking levothyroxine can all be a sign of depression. We will try Remeron to improve mood and appetite. She had a reaction to Citalopram in the past.   Paroxysmal Afib: she never started Eliquis, she states cardiologist called her for a visit but she did not schedule it. She denies chest pain or palpitation  Malnutrition: she has lost another 8  lbs since No 13th,. Weight one year ago 136 lbs one year ago and is down to 119 lbs. She has temporal waiting, very frail . She had a colonoscopy for rectal bleeding 07/28/2019 , labs reviewed and she had two results with hyponatremia - labs done at [facility]. She had multiple visits to [facility] for not feeling well, sometimes feeling anxious. She also had paroxysmal afib. All of this started with change in dentures and difficulty eating. We will refer her to dietician, looks for other causes of weight loss  Patient Active Problem List   Diagnosis Date Noted  . Arrhythmia 08/25/2019  . Rectal bleeding   . Low grade squamous intraepith lesion on cytologic smear cervix (lgsil) 08/04/2018  . Chronic venous insufficiency 07/05/2018  . Lymphedema 07/05/2018  . Hypertension 04/02/2018  . Swelling of limb 04/02/2018  . Depression, major, recurrent, mild ([place]) 12/29/2017  . Leukopenia 05/24/2017  . Allergic rhinitis, seasonal 06/29/2015  . Clavus 06/29/2015  . 1st  degree AV block 06/29/2015  . Hammer toe 06/29/2015  . H/O: HTN (hypertension) 06/29/2015  . Blood glucose elevated 06/29/2015  . Floater, vitreous 06/29/2015  . Bilateral tinnitus 06/29/2015  . Hypothyroidism (acquired) 04/02/2015  . Gastroesophageal reflux disease 04/02/2015  . Hammer toe of right foot 04/02/2015  . Scoliosis of thoracic spine 04/02/2015  . Urethral prolapse 04/02/2015  . Corn of toe 04/02/2015  . Hyperlipidemia 04/02/2015  . Varicose veins of both lower extremities 04/02/2015    Past Surgical History:  Procedure Laterality Date  . ABDOMINAL HYSTERECTOMY  1989  . APPENDECTOMY  1989  . BREAST EXCISIONAL BIOPSY Bilateral    multiple biopsies negative  . BREAST SURGERY     Several  . COLONOSCOPY WITH PROPOFOL N/A 07/28/2019   Procedure: COLONOSCOPY WITH PROPOFOL;  Surgeon: Lin Landsman, MD;  Location: [facility] ENDOSCOPY;  Service: Gastroenterology;  Laterality: N/A;  . TUMOR EXCISION  1989   same time as hysterectomy    Family History  Problem Relation Age of Onset  . Diabetes Father 51       DM complications  . Hypertension Mother 86       CKD  . Thyroid disease Mother   . Heart failure Mother   . Kidney disease Mother   . Breast cancer Sister 65  . Kidney disease Other   . Breast cancer Maternal Aunt   . Breast cancer Cousin   .  Anuerysm Son 51  . Diabetes Son     Social History   Socioeconomic History  . Marital status: Widowed    Spouse name: Not on file  . Number of children: 3  . Years of education: 67  . Highest education level: 12th grade  Occupational History  . Occupation: Retired  Tobacco Use  . Smoking status: Never Smoker  . Smokeless tobacco: Never Used  . Tobacco comment: smoking cessation materials not required  Substance and Sexual Activity  . Alcohol use: Never    Alcohol/week: 0.0 standard drinks  . Drug use: No  . Sexual activity: Not Currently    Partners: Male  Other Topics Concern  . Not on file  Social  History Narrative  . Not on file   Social Determinants of Health   Financial Resource Strain:   . Difficulty of Paying Living Expenses: Not on file  Food Insecurity:   . Worried About Programme researcher, broadcasting/film/video in the Last Year: Not on file  . Ran Out of Food in the Last Year: Not on file  Transportation Needs:   . Lack of Transportation (Medical): Not on file  . Lack of Transportation (Non-Medical): Not on file  Physical Activity: Unknown  . Days of Exercise per Week: 3 days  . Minutes of Exercise per Session: Not on file  Stress:   . Feeling of Stress : Not on file  Social Connections:   . Frequency of Communication with Friends and Family: Not on file  . Frequency of Social Gatherings with Friends and Family: Not on file  . Attends Religious Services: Not on file  . Active Member of Clubs or Organizations: Not on file  . Attends Banker Meetings: Not on file  . Marital Status: Not on file  Intimate Partner Violence:   . Fear of Current or Ex-Partner: Not on file  . Emotionally Abused: Not on file  . Physically Abused: Not on file  . Sexually Abused: Not on file     Current Outpatient Medications:  .  Ascorbic Acid (VITAMIN C) 1000 MG tablet, Take 1,000 mg by mouth daily., Disp: , Rfl:  .  chlorhexidine (PERIDEX) 0.12 % solution, SMARTSIG:0.5 Ounce(s) By Mouth Twice Daily, Disp: , Rfl:  .  Garlic 1000 MG CAPS, Take 1 capsule by mouth daily. , Disp: , Rfl:  .  levothyroxine (SYNTHROID) 88 MCG tablet, Take 1 tablet (88 mcg total) by mouth daily before breakfast., Disp: 90 tablet, Rfl: 0 .  acetaminophen (TYLENOL) 325 MG tablet, Take 2 tablets (650 mg total) by mouth every 6 (six) hours as needed for mild pain (or Fever >/= 101). (Patient not taking: Reported on 10/20/2019), Disp:  , Rfl:  .  apixaban (ELIQUIS) 5 MG TABS tablet, Take 1 tablet (5 mg total) by mouth 2 (two) times daily. (Patient not taking: Reported on 10/20/2019), Disp: 60 tablet, Rfl: 0 .  conjugated  estrogens (PREMARIN) vaginal cream, Place 1 Applicatorful vaginally daily. (Patient not taking: Reported on 10/20/2019), Disp: 42.5 g, Rfl: 12 .  fluticasone (FLONASE) 50 MCG/ACT nasal spray, USE 1 SPRAY IN EACH NOSTRIL ONCE DAILY (Patient not taking: Reported on 10/20/2019), Disp: 16 g, Rfl: 0 .  Iron-Vitamins (GERITOL COMPLETE PO), Take 1 tablet by mouth daily., Disp: , Rfl:  .  TURMERIC CURCUMIN PO, Take 550 mg by mouth daily., Disp: , Rfl:   Allergies  Allergen Reactions  . Aspirin     Tinnitus  . Citalopram Other (See Comments)  .  Codeine Nausea And Vomiting  . Penicillins Rash    Has patient had a PCN reaction causing immediate rash, facial/tongue/throat swelling, SOB or lightheadedness with hypotension: Yes Has patient had a PCN reaction causing severe rash involving mucus membranes or skin necrosis: No Has patient had a PCN reaction that required hospitalization No Has patient had a PCN reaction occurring within the last 10 years: No If all of the above answers are "NO", then may proceed with Cephalosporin use.    I personally reviewed active problem list, medication list, allergies, family history, social history, health maintenance with the patient/caregiver today.   ROS  Constitutional: Negative for fever , positive for  weight change.  Respiratory: Negative for cough and shortness of breath.   Cardiovascular: Negative for chest pain or palpitations.  Gastrointestinal: Negative for abdominal pain, no bowel changes.  Musculoskeletal: Negative for gait problem or joint swelling.  Skin: Negative for rash.  Neurological: Negative for dizziness or headache.  No other specific complaints in a complete review of systems (except as listed in HPI above).  Objective  Vitals:   10/20/19 1142  BP: 136/77  Pulse: (!) 109  Resp: 18  Temp: 98.6 F (37 C)  SpO2: 98%  Weight: 119 lb 1.6 oz (54 kg)  Height:  (1.626 m)    Body mass index is 20.44 kg/m.  Physical  Exam  Constitutional: Patient appears well-developed and malnutrition, eyes sunken, temporal waisting  HEENT: head atraumatic, normocephalic, pupils equal and reactive to light Cardiovascular: Normal rate, regular rhythm and normal heart sounds.  No murmur heard. No BLE edema. Pulmonary/Chest: Effort normal and breath sounds normal. No respiratory distress. Abdominal: Soft.  There is no tenderness. Psychiatric: Patient has a normal mood and affect. behavior is normal. Judgment and thought content normal.  Recent Results (from the past 2160 hour(s))  SARS CORONAVIRUS 2 (TAT 6-24 HRS) Nasopharyngeal Nasopharyngeal Swab     Status: None   Collection Time: 07/25/19 12:29 PM   Specimen: Nasopharyngeal Swab  Result Value Ref Range   SARS Coronavirus 2 NEGATIVE NEGATIVE    Comment: (NOTE) SARS-CoV-2 target nucleic acids are NOT DETECTED. The SARS-CoV-2 RNA is generally detectable in upper and lower respiratory specimens during the acute phase of infection. Negative results do not preclude SARS-CoV-2 infection, do not rule out co-infections with other pathogens, and should not be used as the sole basis for treatment or other patient management decisions. Negative results must be combined with clinical observations, patient history, and epidemiological information. The expected result is Negative. Fact Sheet for Patients: HairSlick.no Fact Sheet for Healthcare Providers: quierodirigir.com This test is not yet approved or cleared by the [place] FDA and  has been authorized for detection and/or diagnosis of SARS-CoV-2 by FDA under an Emergency Use Authorization (EUA). This EUA will remain  in effect (meaning this test can be used) for the duration of the COVID-19 declaration under Section 56 4(b)(1) of the Act, 21 U.S.C. section 360bbb-3(b)(1), unless the authorization is terminated or revoked sooner. Performed at [facility], [address], [place], [place] [address]   CBC     Status: None   Collection Time: 08/24/19 11:08 PM  Result Value Ref Range   WBC 6.0 4.0 - 10.5 K/uL   RBC 4.35 3.87 - 5.11 MIL/uL   Hemoglobin 12.6 12.0 - 15.0 g/dL   HCT 95.6 21.3 - 08.6 %   MCV 87.6 80.0 - 100.0 fL   MCH 29.0 26.0 - 34.0 pg  MCHC 33.1 30.0 - 36.0 g/dL   RDW 42.8 76.8 - 11.5 %   Platelets 209 150 - 400 K/uL   nRBC 0.0 0.0 - 0.2 %    Comment: Performed at [facility], [address] [place], [place] [address]  Comprehensive metabolic panel     Status: Abnormal   Collection Time: 08/24/19 11:08 PM  Result Value Ref Range   Sodium 138 135 - 145 mmol/L   Potassium 4.3 3.5 - 5.1 mmol/L   Chloride 99 98 - 111 mmol/L   CO2 29 22 - 32 mmol/L   Glucose, Bld 109 (H) 70 - 99 mg/dL   BUN 10 8 - 23 mg/dL   Creatinine, Ser 3.55 0.44 - 1.00 mg/dL   Calcium 9.4 8.9 - 97.4 mg/dL   Total Protein 7.9 6.5 - 8.1 g/dL   Albumin 4.3 3.5 - 5.0 g/dL   AST 26 15 - 41 U/L   ALT 24 0 - 44 U/L   Alkaline Phosphatase 76 38 - 126 U/L   Total Bilirubin 0.6 0.3 - 1.2 mg/dL   GFR calc non Af Amer >60 >60 mL/min   GFR calc Af Amer >60 >60 mL/min   Anion gap 10 5 - 15    Comment: Performed at [facility], [address] [place], [place] [address]  Troponin I (High Sensitivity)     Status: None   Collection Time: 08/24/19 11:08 PM  Result Value Ref Range   Troponin I (High Sensitivity) 8 <18 ng/L    Comment: (NOTE) Elevated high sensitivity troponin I (hsTnI) values and significant  changes across serial measurements may suggest ACS but many other  chronic and acute conditions are known to elevate hsTnI results.  Refer to the "Links" section for chest pain algorithms and additional  guidance. Performed at [facility], [address] [place], [place] [address]   Urinalysis, Complete w Microscopic     Status: Abnormal   Collection Time: 08/24/19 11:08 PM  Result Value Ref Range   Color, Urine  COLORLESS (A) YELLOW   APPearance CLEAR (A) CLEAR   Specific Gravity, Urine 1.003 (L) 1.005 - 1.030   pH 5.0 5.0 - 8.0   Glucose, UA NEGATIVE NEGATIVE mg/dL   Hgb urine dipstick SMALL (A) NEGATIVE   Bilirubin Urine NEGATIVE NEGATIVE   Ketones, ur NEGATIVE NEGATIVE mg/dL   Protein, ur NEGATIVE NEGATIVE mg/dL   Nitrite NEGATIVE NEGATIVE   Leukocytes,Ua MODERATE (A) NEGATIVE   RBC / HPF 0-5 0 - 5 RBC/hpf   WBC, UA 0-5 0 - 5 WBC/hpf   Bacteria, UA NONE SEEN NONE SEEN   Squamous Epithelial / LPF 0-5 0 - 5    Comment: Performed at [facility], [address] [place], [place] [address]  Magnesium     Status: Abnormal   Collection Time: 08/24/19 11:08 PM  Result Value Ref Range   Magnesium 2.6 (H) 1.7 - 2.4 mg/dL    Comment: Performed at [facility], [address] [place], [place] [address]  TSH     Status: None   Collection Time: 08/24/19 11:08 PM  Result Value Ref Range   TSH 1.313 0.350 - 4.500 uIU/mL    Comment: Performed by a 3rd Generation assay with a functional sensitivity of <=0.01 uIU/mL. Performed at [facility], [address] [place], [place] [address]   SARS CORONAVIRUS 2 (TAT 6-24 HRS) Nasopharyngeal Nasopharyngeal Swab     Status: None   Collection Time:  08/25/19  1:55 PM   Specimen: Nasopharyngeal Swab  Result Value Ref Range   SARS Coronavirus 2 NEGATIVE NEGATIVE    Comment: (NOTE) SARS-CoV-2 target nucleic acids are NOT DETECTED. The SARS-CoV-2 RNA is generally detectable in upper and lower respiratory specimens during the acute phase of infection. Negative results do not preclude SARS-CoV-2 infection, do not rule out co-infections with other pathogens, and should not be used as the sole basis for treatment or other patient management decisions. Negative results must be combined with clinical observations, patient history, and epidemiological information. The expected result is Negative. Fact Sheet for  Patients: HairSlick.no Fact Sheet for Healthcare Providers: quierodirigir.com This test is not yet approved or cleared by the [place] FDA and  has been authorized for detection and/or diagnosis of SARS-CoV-2 by FDA under an Emergency Use Authorization (EUA). This EUA will remain  in effect (meaning this test can be used) for the duration of the COVID-19 declaration under Section 56 4(b)(1) of the Act, 21 U.S.C. section 360bbb-3(b)(1), unless the authorization is terminated or revoked sooner. Performed at [facility], [address] [place], [place] [address]   Basic metabolic panel     Status: Abnormal   Collection Time: 09/06/19  5:11 AM  Result Value Ref Range   Sodium 137 135 - 145 mmol/L   Potassium 4.6 3.5 - 5.1 mmol/L   Chloride 98 98 - 111 mmol/L   CO2 29 22 - 32 mmol/L   Glucose, Bld 115 (H) 70 - 99 mg/dL   BUN 10 8 - 23 mg/dL   Creatinine, Ser 1.91 0.44 - 1.00 mg/dL   Calcium 9.4 8.9 - 47.8 mg/dL   GFR calc non Af Amer >60 >60 mL/min   GFR calc Af Amer >60 >60 mL/min   Anion gap 10 5 - 15    Comment: Performed at [facility], [address] [place], [place] [address]  CBC     Status: None   Collection Time: 09/06/19  5:11 AM  Result Value Ref Range   WBC 5.4 4.0 - 10.5 K/uL   RBC 4.42 3.87 - 5.11 MIL/uL   Hemoglobin 12.7 12.0 - 15.0 g/dL   HCT 13.0 86.5 - 78.4 %   MCV 88.0 80.0 - 100.0 fL   MCH 28.7 26.0 - 34.0 pg   MCHC 32.6 30.0 - 36.0 g/dL   RDW 69.6 29.5 - 28.4 %   Platelets 229 150 - 400 K/uL   nRBC 0.0 0.0 - 0.2 %    Comment: Performed at [facility], [address], [place], [place] [address]  Troponin I (High Sensitivity)     Status: None   Collection Time: 09/06/19  5:11 AM  Result Value Ref Range   Troponin I (High Sensitivity) 5 <18 ng/L    Comment: (NOTE) Elevated high sensitivity troponin I (hsTnI) values and significant  changes across serial measurements  may suggest ACS but many other  chronic and acute conditions are known to elevate hsTnI results.  Refer to the "Links" section for chest pain algorithms and additional  guidance. Performed at [facility], [address] [place], [place] [address]   Troponin I (High Sensitivity)     Status: None   Collection Time: 09/06/19  7:40 AM  Result Value Ref Range   Troponin I (High Sensitivity) 5 <18 ng/L    Comment: (NOTE) Elevated high sensitivity troponin I (hsTnI) values and significant  changes across serial measurements may suggest ACS but  many other  chronic and acute conditions are known to elevate hsTnI results.  Refer to the "Links" section for chest pain algorithms and additional  guidance. Performed at [facility], [address] [place], [place] [address]   Comprehensive metabolic panel     Status: Abnormal   Collection Time: 09/22/19 12:45 PM  Result Value Ref Range   Sodium 133 (L) 135 - 145 mmol/L   Potassium 4.7 3.5 - 5.1 mmol/L   Chloride 95 (L) 98 - 111 mmol/L   CO2 27 22 - 32 mmol/L   Glucose, Bld 176 (H) 70 - 99 mg/dL   BUN 11 8 - 23 mg/dL   Creatinine, Ser 6.04 0.44 - 1.00 mg/dL   Calcium 9.1 8.9 - 54.0 mg/dL   Total Protein 7.9 6.5 - 8.1 g/dL   Albumin 4.4 3.5 - 5.0 g/dL   AST 38 15 - 41 U/L   ALT 22 0 - 44 U/L   Alkaline Phosphatase 62 38 - 126 U/L   Total Bilirubin 1.0 0.3 - 1.2 mg/dL   GFR calc non Af Amer >60 >60 mL/min   GFR calc Af Amer >60 >60 mL/min   Anion gap 11 5 - 15    Comment: Performed at [facility], [address] [place], [place] [address]  CBC     Status: Abnormal   Collection Time: 09/22/19 12:45 PM  Result Value Ref Range   WBC 3.8 (L) 4.0 - 10.5 K/uL   RBC 4.45 3.87 - 5.11 MIL/uL   Hemoglobin 13.1 12.0 - 15.0 g/dL   HCT 14.7 82.9 - 56.2 %   MCV 88.3 80.0 - 100.0 fL   MCH 29.4 26.0 - 34.0 pg   MCHC 33.3 30.0 - 36.0 g/dL   RDW 13.0 86.5 - 78.4 %   Platelets 205 150 - 400 K/uL   nRBC 0.0 0.0 -  0.2 %    Comment: Performed at [facility], [address] [place], [place] [address]  Magnesium     Status: None   Collection Time: 09/22/19  2:09 PM  Result Value Ref Range   Magnesium 2.3 1.7 - 2.4 mg/dL    Comment: Performed at [facility], [address] [place], [place] [address]  Troponin I (High Sensitivity)     Status: None   Collection Time: 09/22/19  2:09 PM  Result Value Ref Range   Troponin I (High Sensitivity) 6 <18 ng/L    Comment: (NOTE) Elevated high sensitivity troponin I (hsTnI) values and significant  changes across serial measurements may suggest ACS but many other  chronic and acute conditions are known to elevate hsTnI results.  Refer to the "Links" section for chest pain algorithms and additional  guidance. Performed at [facility], [address] [place], [place] [address]   CBC     Status: None   Collection Time: 09/24/19  4:16 PM  Result Value Ref Range   WBC 4.5 4.0 - 10.5 K/uL   RBC 4.72 3.87 - 5.11 MIL/uL   Hemoglobin 13.7 12.0 - 15.0 g/dL   HCT 10.2 72.5 - 36.6 %   MCV 84.1 80.0 - 100.0 fL   MCH 29.0 26.0 - 34.0 pg   MCHC 34.5 30.0 - 36.0 g/dL   RDW 44.0 34.7 - 42.5 %   Platelets 210 150 - 400 K/uL   nRBC 0.0 0.0 - 0.2 %    Comment: Performed at [facility], [address], [place], [place]  [id number]  CMP     Status: Abnormal   Collection Time: 09/24/19  4:16 PM  Result Value Ref Range   Sodium 133 (L) 135 - 145 mmol/L   Potassium 4.2 3.5 - 5.1 mmol/L   Chloride 94 (L) 98 - 111 mmol/L   CO2 23 22 - 32 mmol/L   Glucose, Bld 69 (L) 70 - 99 mg/dL   BUN 9 8 - 23 mg/dL   Creatinine, Ser 6.040.70 0.44 - 1.00 mg/dL   Calcium 9.2 8.9 - 54.010.3 mg/dL   Total Protein 8.3 (H) 6.5 - 8.1 g/dL   Albumin 4.7 3.5 - 5.0 g/dL   AST 37 15 - 41 U/L   ALT 23 0 - 44 U/L   Alkaline Phosphatase 68 38 - 126 U/L   Total Bilirubin 1.4 (H) 0.3 - 1.2 mg/dL   GFR calc non Af Amer >60 >60 mL/min   GFR calc Af Amer >60 >60  mL/min   Anion gap 16 (H) 5 - 15    Comment: Performed at [facility], [address] [place], [place] [id number]    PHQ2/9: Depression screen [facility] 2/9 10/20/2019 09/28/2019 09/09/2019 08/17/2019 08/05/2019  Decreased Interest 0 0 0 2 0  Down, Depressed, Hopeless 1 1 0 2 1  PHQ - 2 Score 1 1 0 4 1  Altered sleeping 0 1 0 2 0  Tired, decreased energy 0 3 0 2 0  Change in appetite 1 3 0 2 0  Feeling bad or failure about yourself  0 0 0 0 0  Trouble concentrating 0 3 0 0 0  Moving slowly or fidgety/restless 0 0 0 0 0  Suicidal thoughts 0 0 0 0 0  PHQ-9 Score 2 11 0 10 1  Difficult doing work/chores Not difficult at all Somewhat difficult Not difficult at all Not difficult at all Not difficult at all  Some recent data might be hidden     Phq9 is positive   Fall Risk: Fall Risk  10/20/2019 09/28/2019 09/09/2019 08/05/2019 06/27/2019  Falls in the past year? 0 0 0 0 0  Number falls in past yr: 0 0 0 0 0  Injury with Fall? - 0 0 0 0  Risk for fall due to : - - - - -  Risk for fall due to: Comment - - - - -  Follow up Falls evaluation completed - Falls evaluation completed Falls prevention discussed -    Assessment & Plan  1. Moderate protein-calorie malnutrition (HCC)  - mirtazapine (REMERON) 15 MG tablet; Take 1 tablet (15 mg total) by mouth at bedtime. For appetite  Dispense: 90 tablet; Refill: 0 - Amb ref to Medical Nutrition Therapy-MNT - HIV Antibody (routine testing w rflx) - C-reactive protein - Sedimentation rate    2. Dentures complicating chewing  Seeing dentist and will have another denture made for her  3. Mild episode of recurrent major depressive disorder (HCC)  Allergic reaction to Citalopram   4. Hyponatremia  - BMP w/ GFR - [facility]  5. Paroxysmal atrial fibrillation (HCC)  Never took Eliquis, did not schedule follow up with cardiologist , explained risk of strokes   6. Hypothyroidism (acquired)  - TSH

## 2019-10-21 LAB — BASIC METABOLIC PANEL WITH GFR
BUN/Creatinine Ratio: 7 (calc) (ref 6–22)
BUN: 5 mg/dL — ABNORMAL LOW (ref 7–25)
CO2: 28 mmol/L (ref 20–32)
Calcium: 9.5 mg/dL (ref 8.6–10.4)
Chloride: 92 mmol/L — ABNORMAL LOW (ref 98–110)
Creat: 0.67 mg/dL (ref 0.60–0.93)
GFR, Est African American: 101 mL/min/{1.73_m2} (ref >=60)
GFR, Est Non African American: 87 mL/min/{1.73_m2} (ref >=60)
Glucose, Bld: 99 mg/dL (ref 65–99)
Potassium: 4.1 mmol/L (ref 3.5–5.3)
Sodium: 133 mmol/L — ABNORMAL LOW (ref 135–146)

## 2019-10-21 LAB — HIV ANTIBODY (ROUTINE TESTING W REFLEX): HIV 1&2 Ab, 4th Generation: NONREACTIVE

## 2019-10-21 LAB — TSH: TSH: 4.58 mIU/L — ABNORMAL HIGH (ref 0.40–4.50)

## 2019-10-21 LAB — C-REACTIVE PROTEIN: CRP: 2.1 mg/L (ref <8.0)

## 2019-10-21 LAB — SEDIMENTATION RATE: Sed Rate: 17 mm/h (ref 0–30)

## 2019-10-24 ENCOUNTER — Other Ambulatory Visit: Payer: Self-pay | Admitting: Family Medicine

## 2019-10-24 DIAGNOSIS — E039 Hypothyroidism, unspecified: Secondary | ICD-10-CM

## 2019-10-24 DIAGNOSIS — E871 Hypo-osmolality and hyponatremia: Secondary | ICD-10-CM

## 2019-10-24 MED ORDER — LEVOTHYROXINE SODIUM 88 MCG PO TABS: 88.0000 ug | ORAL_TABLET | Freq: Every day | ORAL | 0 refills | Status: DC

## 2019-10-24 NOTE — Telephone Encounter (Signed)
Medication Refill - Medication: levothyroxine (SYNTHROID) 88 MCG tablet    Has the patient contacted their pharmacy? Yes.   (Agent: If no, request that the patient contact the pharmacy for the refill.) (Agent: If yes, when and what did the pharmacy advise?)  Preferred Pharmacy (with phone number or street name):  [place], [place] Phone:  [phone]  Fax:  [phone]       Agent: Please be advised that RX refills may take up to 3 business days. We ask that you follow-up with your pharmacy.

## 2019-10-30 ENCOUNTER — Other Ambulatory Visit: Payer: Self-pay

## 2019-10-30 ENCOUNTER — Emergency Department: Payer: Medicare HMO

## 2019-10-30 DIAGNOSIS — I10 Essential (primary) hypertension: Secondary | ICD-10-CM | POA: Diagnosis not present

## 2019-10-30 DIAGNOSIS — R1310 Dysphagia, unspecified: Secondary | ICD-10-CM | POA: Diagnosis not present

## 2019-10-30 DIAGNOSIS — E039 Hypothyroidism, unspecified: Secondary | ICD-10-CM | POA: Diagnosis not present

## 2019-10-30 DIAGNOSIS — R0602 Shortness of breath: Secondary | ICD-10-CM | POA: Diagnosis not present

## 2019-10-30 DIAGNOSIS — K224 Dyskinesia of esophagus: Secondary | ICD-10-CM | POA: Diagnosis not present

## 2019-10-30 DIAGNOSIS — I77810 Thoracic aortic ectasia: Secondary | ICD-10-CM | POA: Diagnosis not present

## 2019-10-30 DIAGNOSIS — Z79899 Other long term (current) drug therapy: Secondary | ICD-10-CM | POA: Diagnosis not present

## 2019-10-30 DIAGNOSIS — R0989 Other specified symptoms and signs involving the circulatory and respiratory systems: Secondary | ICD-10-CM | POA: Diagnosis not present

## 2019-10-30 LAB — BASIC METABOLIC PANEL
Anion gap: 12 (ref 5–15)
BUN: <5 mg/dL — ABNORMAL LOW (ref 8–23)
CO2: 27 mmol/L (ref 22–32)
Calcium: 9.4 mg/dL (ref 8.9–10.3)
Chloride: 92 mmol/L — ABNORMAL LOW (ref 98–111)
Creatinine, Ser: 0.62 mg/dL (ref 0.44–1.00)
GFR calc Af Amer: >60 mL/min (ref >60)
GFR calc non Af Amer: >60 mL/min (ref >60)
Glucose, Bld: 100 mg/dL — ABNORMAL HIGH (ref 70–99)
Potassium: 4.9 mmol/L (ref 3.5–5.1)
Sodium: 131 mmol/L — ABNORMAL LOW (ref 135–145)

## 2019-10-30 LAB — CBC
HCT: 35.9 % — ABNORMAL LOW (ref 36.0–46.0)
Hemoglobin: 12.1 g/dL (ref 12.0–15.0)
MCH: 29.3 pg (ref 26.0–34.0)
MCHC: 33.7 g/dL (ref 30.0–36.0)
MCV: 86.9 fL (ref 80.0–100.0)
Platelets: 207 10*3/uL (ref 150–400)
RBC: 4.13 MIL/uL (ref 3.87–5.11)
RDW: 14.6 % (ref 11.5–15.5)
WBC: 3.6 10*3/uL — ABNORMAL LOW (ref 4.0–10.5)
nRBC: 0 % (ref 0.0–0.2)

## 2019-10-30 LAB — TROPONIN I (HIGH SENSITIVITY)
Troponin I (High Sensitivity): 4 ng/L (ref <18)
Troponin I (High Sensitivity): 5 ng/L (ref <18)

## 2019-10-30 NOTE — ED Triage Notes (Signed)
Pt states she recently had dental surgery (got new teeth) and has been having to eat/drink soft foods. States today had started having extra gas and states she couldn't get her ensure down. Swallowing water perfectly in triage. States she can swallow. States she has been burping and passing gas. Denies feeling like something is stuck in throat. Hx reflux, states this doesn't feel like that. A&O, ambulatory. No distress noted. Speaking in complete sentences.

## 2019-10-30 NOTE — ED Notes (Signed)
Patient given up date regarding wait time.  Also spoke with patient's son who reports that patient has lost 10-12 lbs recently and family and MD feel this might bet related to ill fitting dentures.  Family concerned because patient will not put in effort or try the soft foods and smoothies that they fix for her.  Concerned she might need to go to rehab to help with weight gain.

## 2019-10-31 ENCOUNTER — Emergency Department: Payer: Medicare HMO

## 2019-10-31 ENCOUNTER — Telehealth: Payer: Self-pay

## 2019-10-31 ENCOUNTER — Emergency Department
Admission: EM | Admit: 2019-10-31 | Discharge: 2019-10-31 | Disposition: A | Discharge status: Home / Self Care | Payer: Medicare HMO | Attending: Emergency Medicine | Admitting: Emergency Medicine

## 2019-10-31 DIAGNOSIS — R0989 Other specified symptoms and signs involving the circulatory and respiratory systems: Secondary | ICD-10-CM | POA: Diagnosis not present

## 2019-10-31 DIAGNOSIS — K224 Dyskinesia of esophagus: Secondary | ICD-10-CM | POA: Diagnosis not present

## 2019-10-31 DIAGNOSIS — R1310 Dysphagia, unspecified: Secondary | ICD-10-CM

## 2019-10-31 MED ORDER — SODIUM CHLORIDE 0.9 % IV BOLUS
1000.0000 mL | Freq: Once | INTRAVENOUS | Status: AC
  Administered 2019-10-31: 07:00:00 1000 mL via INTRAVENOUS

## 2019-10-31 NOTE — ED Notes (Signed)
Patient transported to CT 

## 2019-10-31 NOTE — Discharge Instructions (Addendum)
Call the gastroenterologist that you were referred to in order to arrange for an endoscopy as soon as possible.  Return to the ER for any new or worsening difficulty swallowing, if you become unable to swallow liquids, have vomiting, weakness or any other new or worsening symptoms that concern you.

## 2019-10-31 NOTE — Telephone Encounter (Signed)
Called and left a message for call back  

## 2019-10-31 NOTE — ED Notes (Signed)
IV attempted x 2 by this RN. 

## 2019-10-31 NOTE — ED Provider Notes (Signed)
-----------------------------------------   9:49 AM on 10/31/2019 -----------------------------------------  I took over care on this patient from Dr. Erma Heritage from the overnight shift.  The patient was pending a barium swallow.  The study shows narrowing of the distal esophagus and delayed emptying, concerning for an extrinsic mass.  The recommendation is for EGD.  The patient's lab work-up is reassuring, and she is tolerating liquids.  On reassessment, she appears comfortable and would like to go home.  At this time, she is stable for discharge with close outpatient follow-up.  The patient reports that she got a referral from her PMD Dr. Carlynn Purl to follow-up with GI, but she does not know the name of the doctor she was referred to.  She states she has the referral paper at home.  I see that she saw Dr. Allegra Lai for a colonoscopy in October, but she is not sure if this is who she was referred to again.  I explained the results of the work-up to the patient, and instructed her to call the number on her referral this morning as she will need an endoscopy.  I also sent an epic message to both Dr. Carlynn Purl and Dr. Allegra Lai.  I gave the patient thorough return precautions and verbal as well as printed discharge instructions.  Patient expresses understanding.   Dionne Bucy, MD 10/31/19 [phone]

## 2019-10-31 NOTE — Telephone Encounter (Signed)
-----   Message from Toney Reil, MD sent at 10/31/2019 12:58 PM EST ----- Regarding: Urgent appointment Please call the patient and make appointment ASAPDiagnosis: Dysphagia  ThanksRV

## 2019-10-31 NOTE — ED Provider Notes (Signed)
[facility] Emergency Department Provider Note  ____________________________________________   First MD Initiated Contact with Patient 10/31/19 [phone]     (approximate)  I have reviewed the triage vital signs and the nursing notes.   HISTORY  Chief Complaint Gas and Shortness of Breath    HPI Maureen [name] is a 74 y.o. female  Here with difficulty swallowing. Pt has had progressively worsening difficulty swallowing. Pt has had recurrent ED visits for same. She states that over the past several weeks, she feels like food is getting stuck in her throat. She's had multiple ED visits for this and is being referred to GI. Had neg CT neck recently as well. She has lost >10 lb due to this and is having difficulty even swallowing liquids now. No overt pain. No fever, chills. No vomiting or regurgitation. She also has been eating a more liquid diet 2/2 denture problems and feels this could be related. No other complaints.        Past Medical History:  Diagnosis Date  . Allergy   . Corn of toe   . First degree AV block   . GERD (gastroesophageal reflux disease)   . Hammer toe of right foot   . Hyperglycemia   . Hyperlipidemia   . Hypertension   . Loss of appetite   . Prolapse of urethra   . Scoliosis (and kyphoscoliosis), idiopathic   . Thyroid disease   . Tinnitus of both ears   . Vitreous floaters of right eye     Patient Active Problem List   Diagnosis Date Noted  . Arrhythmia 08/25/2019  . Rectal bleeding   . Low grade squamous intraepith lesion on cytologic smear cervix (lgsil) 08/04/2018  . Chronic venous insufficiency 07/05/2018  . Lymphedema 07/05/2018  . Hypertension 04/02/2018  . Swelling of limb 04/02/2018  . Depression, major, recurrent, mild ([place]) 12/29/2017  . Leukopenia 05/24/2017  . Allergic rhinitis, seasonal 06/29/2015  . Clavus 06/29/2015  . 1st degree AV block 06/29/2015  . Hammer toe 06/29/2015  . H/O: HTN (hypertension)  06/29/2015  . Blood glucose elevated 06/29/2015  . Floater, vitreous 06/29/2015  . Bilateral tinnitus 06/29/2015  . Hypothyroidism (acquired) 04/02/2015  . Gastroesophageal reflux disease 04/02/2015  . Hammer toe of right foot 04/02/2015  . Scoliosis of thoracic spine 04/02/2015  . Urethral prolapse 04/02/2015  . Corn of toe 04/02/2015  . Hyperlipidemia 04/02/2015  . Varicose veins of both lower extremities 04/02/2015    Past Surgical History:  Procedure Laterality Date  . ABDOMINAL HYSTERECTOMY  1989  . APPENDECTOMY  1989  . BREAST EXCISIONAL BIOPSY Bilateral    multiple biopsies negative  . BREAST SURGERY     Several  . COLONOSCOPY WITH PROPOFOL N/A 07/28/2019   Procedure: COLONOSCOPY WITH PROPOFOL;  Surgeon: Lin Landsman, MD;  Location: [facility] ENDOSCOPY;  Service: Gastroenterology;  Laterality: N/A;  . TUMOR EXCISION  1989   same time as hysterectomy    Prior to Admission medications   Medication Sig Start Date End Date Taking? Authorizing Provider  apixaban (ELIQUIS) 5 MG TABS tablet Take 1 tablet (5 mg total) by mouth 2 (two) times daily. Patient not taking: Reported on 10/20/2019 08/26/19   Nicholes Mango, MD  Ascorbic Acid (VITAMIN C) 1000 MG tablet Take 1,000 mg by mouth daily.    [provider]  chlorhexidine (PERIDEX) 0.12 % solution SMARTSIG:0.5 Ounce(s) By Mouth Twice Daily 09/05/19   [provider]  conjugated estrogens (PREMARIN) vaginal cream Place  1 Applicatorful vaginally daily. Patient not taking: Reported on 10/20/2019 01/11/19   Alba Cory, MD  Garlic 1000 MG CAPS Take 1 capsule by mouth daily.     [provider]  Iron-Vitamins (GERITOL COMPLETE PO) Take 1 tablet by mouth daily.    [provider]  levothyroxine (SYNTHROID) 88 MCG tablet Take 1 tablet (88 mcg total) by mouth daily before breakfast. 10/24/19   Carlynn Purl, Danna Hefty, MD  mirtazapine (REMERON) 15 MG tablet Take 1 tablet (15 mg total) by mouth at bedtime.  For appetite 10/20/19   Alba Cory, MD  TURMERIC CURCUMIN PO Take 550 mg by mouth daily.    [provider]    Allergies Aspirin, Citalopram, Codeine, and Penicillins  Family History  Problem Relation Age of Onset  . Diabetes Father 42       DM complications  . Hypertension Mother 83       CKD  . Thyroid disease Mother   . Heart failure Mother   . Kidney disease Mother   . Breast cancer Sister 20  . Kidney disease Other   . Breast cancer Maternal Aunt   . Breast cancer Cousin   . Anuerysm Son 51  . Diabetes Son     Social History Social History   Tobacco Use  . Smoking status: Never Smoker  . Smokeless tobacco: Never Used  . Tobacco comment: smoking cessation materials not required  Substance Use Topics  . Alcohol use: Never    Alcohol/week: 0.0 standard drinks  . Drug use: No    Review of Systems  Review of Systems  Constitutional: Positive for fatigue and unexpected weight change. Negative for fever.  HENT: Positive for trouble swallowing. Negative for congestion and sore throat.   Eyes: Negative for visual disturbance.  Respiratory: Negative for cough and shortness of breath.   Cardiovascular: Negative for chest pain.  Gastrointestinal: Positive for nausea. Negative for abdominal pain, diarrhea and vomiting.  Genitourinary: Negative for flank pain.  Musculoskeletal: Negative for back pain and neck pain.  Skin: Negative for rash and wound.  Neurological: Negative for weakness.  All other systems reviewed and are negative.    ____________________________________________  PHYSICAL EXAM:      VITAL SIGNS: ED Triage Vitals [10/30/19 1815]  Enc Vitals Group     BP (!) 143/86     Pulse Rate (!) 108     Resp 18     Temp 98.3 F (36.8 C)     Temp Source Oral     SpO2 99 %     Weight 100 lb (45.4 kg)     Height 5\' 3"  (1.6 m)     Head Circumference      Peak Flow      Pain Score 0     Pain Loc      Pain Edu?      Excl. in GC?       Physical Exam Vitals and nursing note reviewed.  Constitutional:      General: She is not in acute distress.    Appearance: She is well-developed.  HENT:     Head: Normocephalic and atraumatic.     Mouth/Throat:     Mouth: Mucous membranes are moist.  Eyes:     Conjunctiva/sclera: Conjunctivae normal.  Cardiovascular:     Rate and Rhythm: Normal rate and regular rhythm.     Heart sounds: Normal heart sounds.  Pulmonary:     Effort: Pulmonary effort is normal. No respiratory distress.  Breath sounds: No decreased breath sounds or wheezing.  Chest:     Chest wall: No tenderness.  Abdominal:     General: There is no distension.     Comments: No distension. No TTP. No rebound or guarding.  Musculoskeletal:     Cervical back: Neck supple.  Skin:    General: Skin is warm.     Capillary Refill: Capillary refill takes less than 2 seconds.     Findings: No rash.  Neurological:     Mental Status: She is alert and oriented to person, place, and time.     Motor: No abnormal muscle tone.       ____________________________________________   LABS (all labs ordered are listed, but only abnormal results are displayed)  Labs Reviewed  BASIC METABOLIC PANEL - Abnormal; Notable for the following components:      Result Value   Sodium 131 (*)    Chloride 92 (*)    Glucose, Bld 100 (*)    BUN <5 (*)    All other components within normal limits  CBC - Abnormal; Notable for the following components:   WBC 3.6 (*)    HCT 35.9 (*)    All other components within normal limits  TROPONIN I (HIGH SENSITIVITY)  TROPONIN I (HIGH SENSITIVITY)    ____________________________________________  EKG: Sinus tachycardia, VR 104. QRS 78, QTc 454. Non-specific ST changes. No acute ST elevations or depressions. ________________________________________  RADIOLOGY All imaging, including plain films, CT scans, and ultrasounds, independently reviewed by me, and interpretations confirmed via  formal radiology reads.  ED MD interpretation:   XR Soft tissue: No foreign body CXR: No acute abnormality  Official radiology report(s): DG Neck Soft Tissue  Result Date: 10/31/2019 CLINICAL DATA:  Foreign body sensation. EXAM: NECK SOFT TISSUES - 1+ VIEW COMPARISON:  CT neck 09/24/2019 FINDINGS: There is no evidence of retropharyngeal soft tissue swelling or epiglottic enlargement. The cervical airway is unremarkable. Rounded calcification projecting over the cervical trachea represent calcified thyroid nodule on CT. No radio-opaque foreign body identified. Multilevel degenerative change in the spine. IMPRESSION: 1. No radiopaque foreign body. 2. Rounded calcification projecting over the cervical trachea represents thyroid calcification on recent CT. Electronically Signed   By: Narda Rutherford M.D.   On: 10/31/2019 02:16   DG Chest 2 View  Result Date: 10/30/2019 CLINICAL DATA:  Initial evaluation for possible reflux disease. EXAM: CHEST - 2 VIEW COMPARISON:  Prior radiograph from 09/24/2019. FINDINGS: Transverse heart size within normal limits. Mediastinal silhouette within normal limits. Tortuosity the intrathoracic aorta noted. Lungs normally inflated. No focal infiltrates. No pulmonary edema or pleural effusion. No pneumothorax. Severe dextroscoliosis of the thoracic spine noted. Degenerative changes noted about the shoulders. No acute osseous finding. IMPRESSION: 1. No active cardiopulmonary disease. 2. Severe thoracic dextroscoliosis, stable. Electronically Signed   By: Rise Mu M.D.   On: 10/30/2019 18:49    ____________________________________________  PROCEDURES   Procedure(s) performed (including Critical Care):  Procedures  ____________________________________________  INITIAL IMPRESSION / MDM / ASSESSMENT AND PLAN / ED COURSE  As part of my medical decision making, I reviewed the following data within the electronic MEDICAL RECORD NUMBER Nursing notes reviewed and  incorporated, Old chart reviewed, Notes from prior ED visits, and [place] Controlled Substance Database       *AKIKO SCHEXNIDER was evaluated in Emergency Department on 10/31/2019 for the symptoms described in the history of present illness. She was evaluated in the context of the global COVID-19 pandemic, which necessitated  consideration that the patient might be at risk for infection with the SARS-CoV-2 virus that causes COVID-19. Institutional protocols and algorithms that pertain to the evaluation of patients at risk for COVID-19 are in a state of rapid change based on information released by regulatory bodies including the CDC and federal and state organizations. These policies and algorithms were followed during the patient's care in the ED.  Some ED evaluations and interventions may be delayed as a result of limited staffing during the pandemic.*     Medical Decision Making:  74 yo F here with ongoing weight loss 2/2 difficulty swallowing solids and now liquids. Hydrated on exam as she is able to drink water, but significant belching and dysphagia noted w/ swallowing in ED. Recent CT neck unremarkable. Plain films negative. While labs are overall reassuring, due to prolonged wait times, by the time pt roomed in ED it is almost day time, at which point Radiology will be available. Feel it is pertinent to obtain Barium swallow study to eval for achalasia/stricture, esp given her weight loss and progressive worsening dysphagia. Can likely f/u with GI as outpt thereafter.   ____________________________________________  FINAL CLINICAL IMPRESSION(S) / ED DIAGNOSES  Final diagnoses:  Dysphagia  Dysphagia, unspecified type     MEDICATIONS GIVEN DURING THIS VISIT:  Medications  sodium chloride 0.9 % bolus 1,000 mL (has no administration in time range)     ED Discharge Orders    None       Note:  This document was prepared using Dragon voice recognition software and may include unintentional  dictation errors.   Shaune Pollack, MD 10/31/19 [phone]

## 2019-11-01 ENCOUNTER — Other Ambulatory Visit: Payer: Self-pay

## 2019-11-01 ENCOUNTER — Ambulatory Visit (INDEPENDENT_AMBULATORY_CARE_PROVIDER_SITE_OTHER): Payer: Medicare HMO | Admitting: Gastroenterology

## 2019-11-01 ENCOUNTER — Encounter: Payer: Self-pay | Admitting: Gastroenterology

## 2019-11-01 VITALS — BP 130/81 | HR 102 | Temp 98.2°F | Resp 18 | Ht 64.0 in | Wt 117.4 lb

## 2019-11-01 DIAGNOSIS — R1310 Dysphagia, unspecified: Secondary | ICD-10-CM | POA: Diagnosis not present

## 2019-11-01 DIAGNOSIS — R1319 Other dysphagia: Secondary | ICD-10-CM

## 2019-11-01 MED ORDER — OMEPRAZOLE 40 MG PO CPDR: 40.0000 mg | DELAYED_RELEASE_CAPSULE | Freq: Every day | ORAL | 3 refills | Status: DC

## 2019-11-01 NOTE — Progress Notes (Signed)
Maureen Repress, MD [address]  [address]  [place], [place] [address]  Main: [phone]  Fax: [phone]    Gastroenterology Consultation  Referring Provider:     Alba Cory, MD Primary Care Physician:  Maureen Cory, MD Primary Gastroenterologist:  Dr. Arlyss [name] Reason for Consultation:   Dysphagia        HPI:   Maureen [name] is a 74 y.o. female referred by Dr. Alba Cory, MD  for consultation & management of dysphagia.  Patient went to ER yesterday secondary to difficulty swallowing solids and weight loss of about 10 pounds since October.  She is also undergoing dental work-up and had to modify her diet to more soft foods.  She also expressed difficulty swallowing regular food, this has been ongoing for few weeks now.  She was initially referred by Dr. Carlynn [name] in early December for GERD, my office called her to schedule an appointment, but she did not call back, she said she had a lot going on lately.  She is currently on Pepcid.  She said she was prescribed pantoprazole, however pharmacy did not carry it Patient has history of paroxysmal A. fib and did not follow-up with cardiology.  She never started Eliquis.  She says she has to call and make an appointment.  Labs in the ER revealed negative troponin. Normal CBC and BMP.  Barium esophagogram revealed substantial retention of contrast within the esophagus with long segment narrowing of the distal esophagus and GE junction, very delayed and limited relaxation of the gastroesophageal sphincter.  Patient was unable to swallow barium tablet.  She is only taking Synthroid  NSAIDs: None  Antiplts/Anticoagulants/Anti thrombotics: None  GI Procedures: Colonoscopy in 2012 by Dr. Evette [name], normal  Colonoscopy 07/28/2019 - The entire examined colon is normal. - Non-bleeding external and internal hemorrhoids. - No specimens collected.  Past Medical History:  Diagnosis Date  . Allergy   . Corn of toe   . First  degree AV block   . GERD (gastroesophageal reflux disease)   . Hammer toe of right foot   . Hyperglycemia   . Hyperlipidemia   . Hypertension   . Loss of appetite   . Prolapse of urethra   . Scoliosis (and kyphoscoliosis), idiopathic   . Thyroid disease   . Tinnitus of both ears   . Vitreous floaters of right eye     Past Surgical History:  Procedure Laterality Date  . ABDOMINAL HYSTERECTOMY  1989  . APPENDECTOMY  1989  . BREAST EXCISIONAL BIOPSY Bilateral    multiple biopsies negative  . BREAST SURGERY     Several  . COLONOSCOPY WITH PROPOFOL N/A 07/28/2019   Procedure: COLONOSCOPY WITH PROPOFOL;  Surgeon: Toney Reil, MD;  Location: [facility] ENDOSCOPY;  Service: Gastroenterology;  Laterality: N/A;  . TUMOR EXCISION  1989   same time as hysterectomy    Current Outpatient Medications:  .  Ascorbic Acid (VITAMIN C) 1000 MG tablet, Take 1,000 mg by mouth daily., Disp: , Rfl:  .  chlorhexidine (PERIDEX) 0.12 % solution, SMARTSIG:0.5 Ounce(s) By Mouth Twice Daily, Disp: , Rfl:  .  conjugated estrogens (PREMARIN) vaginal cream, Place 1 Applicatorful vaginally daily., Disp: 42.5 g, Rfl: 12 .  famotidine (PEPCID) 40 MG/5ML suspension, Take by mouth., Disp: , Rfl:  .  Garlic 1000 MG CAPS, Take 1 capsule by mouth daily. , Disp: , Rfl:  .  Iron-Vitamins (GERITOL COMPLETE PO), Take 1 tablet by mouth daily., Disp: , Rfl:  .  levothyroxine (SYNTHROID) 88 MCG tablet, Take 1 tablet (88 mcg total) by mouth daily before breakfast., Disp: 90 tablet, Rfl: 0 .  apixaban (ELIQUIS) 5 MG TABS tablet, Take 1 tablet (5 mg total) by mouth 2 (two) times daily. (Patient not taking: Reported on 10/20/2019), Disp: 60 tablet, Rfl: 0 .  mirtazapine (REMERON) 15 MG tablet, Take 1 tablet (15 mg total) by mouth at bedtime. For appetite (Patient not taking: Reported on 11/01/2019), Disp: 90 tablet, Rfl: 0 .  TURMERIC CURCUMIN PO, Take 550 mg by mouth daily., Disp: , Rfl:    Family History  Problem Relation  Age of Onset  . Diabetes Father 19       DM complications  . Hypertension Mother 96       CKD  . Thyroid disease Mother   . Heart failure Mother   . Kidney disease Mother   . Breast cancer Sister 82  . Kidney disease Other   . Breast cancer Maternal Aunt   . Breast cancer Cousin   . Anuerysm Son 51  . Diabetes Son      Social History   Tobacco Use  . Smoking status: Never Smoker  . Smokeless tobacco: Never Used  . Tobacco comment: smoking cessation materials not required  Substance Use Topics  . Alcohol use: Never    Alcohol/week: 0.0 standard drinks  . Drug use: No    Allergies as of 11/01/2019 - Review Complete 11/01/2019  Allergen Reaction Noted  . Aspirin  06/26/2015  . Citalopram Other (See Comments) 06/26/2015  . Codeine Nausea And Vomiting 04/02/2015  . Penicillins Rash 04/02/2015    Review of Systems:    All systems reviewed and negative except where noted in HPI.   Physical Exam:  BP 130/81 (BP Location: Left Arm, Patient Position: Sitting, Cuff Size: Normal)   Pulse (!) 102   Temp 98.2 F (36.8 C)   Resp 18   Ht 5\' 4"  (1.626 m)   Wt 117 lb 6.4 oz (53.3 kg)   BMI 20.15 kg/m  No LMP recorded. Patient has had a hysterectomy.  General:   Alert,  Well-developed, well-nourished, pleasant and cooperative in NAD Head:  Normocephalic and atraumatic. Eyes:  Sclera clear, no icterus.   Conjunctiva pink. Ears:  Normal auditory acuity. Nose:  No deformity, discharge, or lesions. Mouth:  No deformity or lesions,oropharynx pink & moist. Neck:  Supple; no masses or thyromegaly. Lungs:  Respirations even and unlabored.  Clear throughout to auscultation.   No wheezes, crackles, or rhonchi. No acute distress. Heart: Increased rate and regular rhythm; no murmurs, clicks, rubs, or gallops. Abdomen:  Normal bowel sounds. Soft, non-tender and non-distended without masses, hepatosplenomegaly or hernias noted.  No guarding or rebound tenderness.   Rectal: Not  performed Msk:  Symmetrical without gross deformities. Good, equal movement & strength bilaterally. Pulses:  Normal pulses noted. Extremities:  No clubbing or edema.  No cyanosis. Neurologic:  Alert and oriented x3;  grossly normal neurologically. Skin:  Intact without significant lesions or rashes. No jaundice. Psych:  Alert and cooperative. Normal mood and affect.  Imaging Studies: No recent abdominal imaging  Assessment and Plan:   Maureen [name] is a 74 y.o. female with history of hypothyroidism, paroxysmal A. fib, not on blood thinner seen in consultation for dysphagia and unintentional weight loss.  Abnormal barium esophagogram of the distal esophagus.  Patient is also undergoing dental work-up with diet modification which may be also contributing to weight loss  Dysphagia with  weight loss Recommend EGD to evaluate for any structural lesions, perform esophageal biopsies If EGD is unremarkable, recommend esophageal manometry to evaluate for dysmotility disorder  Recommend to start omeprazole 40 mg daily before breakfast   Follow up in 1 month   Cephas Darby, MD

## 2019-11-02 ENCOUNTER — Telehealth: Payer: Self-pay | Admitting: Gastroenterology

## 2019-11-02 NOTE — Telephone Encounter (Signed)
Please call patient to schedule EGD for 11-07-18 & 11-10-18. Spoke with son & he states her problem is not getting any better.

## 2019-11-03 ENCOUNTER — Telehealth: Payer: Self-pay | Admitting: Family Medicine

## 2019-11-03 NOTE — Telephone Encounter (Signed)
Pts son called and asked if Dr. Carlynn Purl or her nurse can call the Pt and give her some encouragement and check on her. Mr. Laural Benes states that his mom has a procedure next week to stretch her esophagas and has not been eating and is weak and losing weight/ he stated that his mom wants to cancel the procedure and is acting as if she wants to give up/ son has been trying to get her to eat more / please call Pt for well check and encouragement

## 2019-11-03 NOTE — Telephone Encounter (Signed)
Dr. Carlynn Purl has left the office for today. She will put in a referral for social worker.

## 2019-11-07 ENCOUNTER — Other Ambulatory Visit: Payer: Self-pay | Admitting: *Deleted

## 2019-11-07 ENCOUNTER — Other Ambulatory Visit: Payer: Self-pay

## 2019-11-07 DIAGNOSIS — R1319 Other dysphagia: Secondary | ICD-10-CM

## 2019-11-07 DIAGNOSIS — I10 Essential (primary) hypertension: Secondary | ICD-10-CM

## 2019-11-07 DIAGNOSIS — R1310 Dysphagia, unspecified: Secondary | ICD-10-CM

## 2019-11-07 NOTE — Telephone Encounter (Signed)
Pt son Launa Flight  left vm regarding pt procedure for 11/11/19 he is checking when her Covid 19 test is scheduled for  Please call [phone]

## 2019-11-07 NOTE — Patient Outreach (Signed)
Per Patient Maureen [name] member has had 7 ED visits/hospitalizations within the past 6 months.   Member screened for potential [facility] needs as a benefit of [employer] ACO.  Telephone call made to [name] at [phone]. Patient identifiers confirmed.  Discuss [facility] Care Management follow up. [name] is agreeable. [name] states she has been having a lot of burping and some difficulty swallowing as of late. She also contributes her weight loss to improper fitting dentures. States she is working on getting dentures fixed. Endorses that she drinks ensure supplements. [name] reports she is supposed to have her esophagus stretched this week.   Mrs. Range lives alone but reports she has a supportive family. Denies having any concerns with medications or with transportation.    [name] expressed appreciation of the call.   Will make referral to [facility] Care Management RNCM for ongoing follow up.   Raiford Noble, MSN-Ed, RN,BSN [facility] Post Acute Care Coordinator [phone] [facility]) [phone]  (Toll free office)

## 2019-11-08 ENCOUNTER — Other Ambulatory Visit: Payer: Self-pay

## 2019-11-08 ENCOUNTER — Other Ambulatory Visit
Admission: RE | Admit: 2019-11-08 | Discharge: 2019-11-08 | Disposition: A | Discharge status: Home / Self Care | Payer: Medicare HMO | Source: Ambulatory Visit | Attending: Gastroenterology | Admitting: Gastroenterology

## 2019-11-08 DIAGNOSIS — Z01812 Encounter for preprocedural laboratory examination: Secondary | ICD-10-CM | POA: Insufficient documentation

## 2019-11-08 DIAGNOSIS — Z20822 Contact with and (suspected) exposure to covid-19: Secondary | ICD-10-CM | POA: Diagnosis not present

## 2019-11-08 LAB — SARS CORONAVIRUS 2 (TAT 6-24 HRS): SARS Coronavirus 2: NEGATIVE

## 2019-11-08 NOTE — Patient Outreach (Signed)
[facility]) Care Management  11/08/2019  ANELLE PARLOW 09/24/1946 [id number]     Transition of Care Referral  Referral Date: 11/08/2019 Referral Source:PAC Coordinator Date of Discharge:unknown Insurance:[employer]    Outreach attempt # 1 to patient. Spoke briefly with patient who voiced she could not talk long. She reports that she is getting ready to go get COVID test prior to her procedure scheduled in the next few days.    Plan: RN CM will make outreach attempt to patient within 3-4 business days.   Antionette Fairy, RN,BSN,CCM [facility] Care Management Telephonic Care Management Coordinator Direct Phone: [phone] Toll Free: [phone] Fax: [phone]

## 2019-11-09 ENCOUNTER — Other Ambulatory Visit: Payer: Medicare HMO

## 2019-11-09 ENCOUNTER — Other Ambulatory Visit: Payer: Self-pay

## 2019-11-09 NOTE — Patient Outreach (Signed)
[facility]) Care Management  11/09/2019  Maureen [name] Mar 14, 1946 [id number]   Transition of Care Referral  Referral Date: 11/08/2019 Referral Source:PAC Coordinator Date of Discharge:unknown Insurance:[employer]   Outreach attempt #2 to patient. Spoke with patient who reported she has not been feeling well off and on today. She did not feel up to talking and requested call back another time.      Plan: RN CM will make outreach attempt to patient within 3-4 business days. RN CM will send unsuccessful outreach letter to patient.    Antionette Fairy, RN,BSN,CCM [facility] Care Management Telephonic Care Management Coordinator Direct Phone: [phone] Toll Free: [phone] Fax: [phone]

## 2019-11-11 ENCOUNTER — Ambulatory Visit
Admission: RE | Admit: 2019-11-11 | Discharge: 2019-11-11 | Disposition: A | Discharge status: Home / Self Care | Payer: Medicare HMO | Attending: Gastroenterology | Admitting: Gastroenterology

## 2019-11-11 ENCOUNTER — Other Ambulatory Visit: Payer: Self-pay

## 2019-11-11 ENCOUNTER — Ambulatory Visit: Payer: Medicare HMO | Admitting: Certified Registered Nurse Anesthetist

## 2019-11-11 ENCOUNTER — Encounter: Payer: Self-pay | Admitting: Gastroenterology

## 2019-11-11 ENCOUNTER — Encounter
Admission: RE | Disposition: A | Discharge status: Home / Self Care | Payer: Self-pay | Source: Home / Self Care | Attending: Gastroenterology

## 2019-11-11 ENCOUNTER — Ambulatory Visit: Payer: Self-pay

## 2019-11-11 DIAGNOSIS — Z885 Allergy status to narcotic agent status: Secondary | ICD-10-CM | POA: Insufficient documentation

## 2019-11-11 DIAGNOSIS — Z79890 Hormone replacement therapy (postmenopausal): Secondary | ICD-10-CM | POA: Diagnosis not present

## 2019-11-11 DIAGNOSIS — I10 Essential (primary) hypertension: Secondary | ICD-10-CM | POA: Insufficient documentation

## 2019-11-11 DIAGNOSIS — Z886 Allergy status to analgesic agent status: Secondary | ICD-10-CM | POA: Insufficient documentation

## 2019-11-11 DIAGNOSIS — Z79899 Other long term (current) drug therapy: Secondary | ICD-10-CM | POA: Insufficient documentation

## 2019-11-11 DIAGNOSIS — E039 Hypothyroidism, unspecified: Secondary | ICD-10-CM | POA: Insufficient documentation

## 2019-11-11 DIAGNOSIS — Z8249 Family history of ischemic heart disease and other diseases of the circulatory system: Secondary | ICD-10-CM | POA: Insufficient documentation

## 2019-11-11 DIAGNOSIS — K222 Esophageal obstruction: Secondary | ICD-10-CM | POA: Diagnosis not present

## 2019-11-11 DIAGNOSIS — K219 Gastro-esophageal reflux disease without esophagitis: Secondary | ICD-10-CM | POA: Insufficient documentation

## 2019-11-11 DIAGNOSIS — R1310 Dysphagia, unspecified: Secondary | ICD-10-CM | POA: Diagnosis not present

## 2019-11-11 DIAGNOSIS — F329 Major depressive disorder, single episode, unspecified: Secondary | ICD-10-CM | POA: Insufficient documentation

## 2019-11-11 DIAGNOSIS — R1314 Dysphagia, pharyngoesophageal phase: Secondary | ICD-10-CM | POA: Insufficient documentation

## 2019-11-11 DIAGNOSIS — Z8349 Family history of other endocrine, nutritional and metabolic diseases: Secondary | ICD-10-CM | POA: Diagnosis not present

## 2019-11-11 DIAGNOSIS — Z7982 Long term (current) use of aspirin: Secondary | ICD-10-CM | POA: Diagnosis not present

## 2019-11-11 DIAGNOSIS — I440 Atrioventricular block, first degree: Secondary | ICD-10-CM | POA: Diagnosis not present

## 2019-11-11 DIAGNOSIS — R1319 Other dysphagia: Secondary | ICD-10-CM

## 2019-11-11 DIAGNOSIS — K2100 Gastro-esophageal reflux disease with esophagitis, without bleeding: Secondary | ICD-10-CM | POA: Diagnosis not present

## 2019-11-11 DIAGNOSIS — E785 Hyperlipidemia, unspecified: Secondary | ICD-10-CM | POA: Insufficient documentation

## 2019-11-11 DIAGNOSIS — Z880 Allergy status to penicillin: Secondary | ICD-10-CM | POA: Diagnosis not present

## 2019-11-11 DIAGNOSIS — K449 Diaphragmatic hernia without obstruction or gangrene: Secondary | ICD-10-CM | POA: Diagnosis not present

## 2019-11-11 DIAGNOSIS — Z7901 Long term (current) use of anticoagulants: Secondary | ICD-10-CM | POA: Insufficient documentation

## 2019-11-11 HISTORY — PX: ESOPHAGOGASTRODUODENOSCOPY (EGD) WITH PROPOFOL: SHX5813

## 2019-11-11 SURGERY — ESOPHAGOGASTRODUODENOSCOPY (EGD) WITH PROPOFOL
Anesthesia: General

## 2019-11-11 MED ORDER — SODIUM CHLORIDE 0.9 % IV SOLN
INTRAVENOUS | Status: DC
  Administered 2019-11-11: 1000 mL via INTRAVENOUS

## 2019-11-11 MED ORDER — PROPOFOL 500 MG/50ML IV EMUL
INTRAVENOUS | Status: DC | PRN
  Administered 2019-11-11: 150 ug/kg/min via INTRAVENOUS

## 2019-11-11 MED ORDER — PROPOFOL 500 MG/50ML IV EMUL
INTRAVENOUS | Status: AC
  Filled 2019-11-11: qty 50

## 2019-11-11 MED ORDER — LIDOCAINE HCL (CARDIAC) PF 100 MG/5ML IV SOSY
PREFILLED_SYRINGE | INTRAVENOUS | Status: DC | PRN
  Administered 2019-11-11: 50 mg via INTRAVENOUS

## 2019-11-11 MED ORDER — PROPOFOL 10 MG/ML IV BOLUS
INTRAVENOUS | Status: DC | PRN
  Administered 2019-11-11: 50 mg via INTRAVENOUS

## 2019-11-11 NOTE — Op Note (Signed)
[facility] Gastroenterology Patient Name: Maureen [name] Procedure Date: 11/11/2019 9:08 AM MRN: [id number] Account #: [id number] Date of Birth: 1945/11/02 Admit Type: Outpatient Age: 74 Room: [facility] ENDO ROOM 2 Gender: Female Note Status: Finalized Procedure:             Upper GI endoscopy Indications:           Esophageal dysphagia Providers:             Lin Landsman MD, MD Medicines:             Monitored Anesthesia Care Complications:         No immediate complications. Estimated blood loss: None. Procedure:             Pre-Anesthesia Assessment:                        - Prior to the procedure, a History and Physical was                         performed, and patient medications and allergies were                         reviewed. The patient is competent. The risks and                         benefits of the procedure and the sedation options and                         risks were discussed with the patient. All questions                         were answered and informed consent was obtained.                         Patient identification and proposed procedure were                         verified by the physician, the nurse, the                         anesthesiologist, the anesthetist and the technician                         in the pre-procedure area in the procedure room in the                         endoscopy suite. Mental Status Examination: alert and                         oriented. Airway Examination: normal oropharyngeal                         airway and neck mobility. Respiratory Examination:                         clear to auscultation. CV Examination: normal.                         Prophylactic Antibiotics: The patient does not require  prophylactic antibiotics. Prior Anticoagulants: The                         patient has taken no previous anticoagulant or                         antiplatelet agents. ASA Grade  Assessment: II - A                         patient with mild systemic disease. After reviewing                         the risks and benefits, the patient was deemed in                         satisfactory condition to undergo the procedure. The                         anesthesia plan was to use monitored anesthesia care                         (MAC). Immediately prior to administration of                         medications, the patient was re-assessed for adequacy                         to receive sedatives. The heart rate, respiratory                         rate, oxygen saturations, blood pressure, adequacy of                         pulmonary ventilation, and response to care were                         monitored throughout the procedure. The physical                         status of the patient was re-assessed after the                         procedure.                        After obtaining informed consent, the endoscope was                         passed under direct vision. Throughout the procedure,                         the patient's blood pressure, pulse, and oxygen                         saturations were monitored continuously. The Endoscope                         was introduced through the mouth, and advanced to the  second part of duodenum. The upper GI endoscopy was                         accomplished without difficulty. The patient tolerated                         the procedure well. Findings:      The duodenal bulb and second portion of the duodenum were normal.      A small hiatal hernia was present.      The entire examined stomach was normal.      The cardia and gastric fundus were normal on retroflexion.      A non-obstructing and mild Schatzki ring was found at the       gastroesophageal junction. Empiric dilation was attempted using balloon       upto 54m size, with no mucosal disruption as balloon was not touching       the wall  of esophagus      Esophagogastric landmarks were identified: the gastroesophageal junction       was found at 35 cm from the incisors.      The examined esophagus was normal. Biopsies were taken with a cold       forceps for histology. Impression:            - Normal duodenal bulb and second portion of the                         duodenum.                        - Small hiatal hernia.                        - Normal stomach.                        - Non-obstructing and mild Schatzki ring.                        - Esophagogastric landmarks identified.                        - Normal esophagus. Biopsied. Recommendation:        - Await pathology results.                        - Discharge patient to home (with escort).                        - Chopped diet today.                        - Continue present medications.                        - Return to my office as previously scheduled.                        - Perform routine esophageal manometry at appointment                         to be scheduled based on the path results. Procedure  Code(s):     --- Professional ---                        [phone], Esophagogastroduodenoscopy, flexible,                         transoral; with biopsy, single or multiple Diagnosis Code(s):     --- Professional ---                        K44.9, Diaphragmatic hernia without obstruction or                         gangrene                        K22.2, Esophageal obstruction                        R13.14, Dysphagia, pharyngoesophageal phase CPT copyright 2019 American Medical Association. All rights reserved. The codes documented in this report are preliminary and upon coder review may  be revised to meet current compliance requirements. Dr. Ulyess Mort Lin Landsman MD, MD 11/11/2019 9:41:42 AM This report has been signed electronically. Number of Addenda: 0 Note Initiated On: 11/11/2019 9:08 AM Estimated Blood Loss:  Estimated blood loss: none.       [facility]

## 2019-11-11 NOTE — Anesthesia Postprocedure Evaluation (Signed)
Anesthesia Post Note  Patient: Maureen [name]  Procedure(s) Performed: ESOPHAGOGASTRODUODENOSCOPY (EGD) WITH PROPOFOL (N/A )  Patient location during evaluation: Endoscopy Anesthesia Type: General Level of consciousness: awake and alert and oriented Pain management: pain level controlled Vital Signs Assessment: post-procedure vital signs reviewed and stable Respiratory status: spontaneous breathing, nonlabored ventilation and respiratory function stable Cardiovascular status: blood pressure returned to baseline and stable Postop Assessment: no signs of nausea or vomiting Anesthetic complications: no     Last Vitals:  Vitals:   11/11/19 1019 11/11/19 1023  BP: 121/75 115/75  Pulse: 90   Resp: 17   Temp:    SpO2: 100%     Last Pain:  Vitals:   11/11/19 1023  TempSrc:   PainSc: 0-No pain                 Naryiah Schley

## 2019-11-11 NOTE — Anesthesia Procedure Notes (Signed)
Date/Time: 11/11/2019 9:24 AM Performed by: Ginger Carne, CRNA Pre-anesthesia Checklist: Patient identified, Emergency Drugs available, Suction available, Patient being monitored and Timeout performed Patient Re-evaluated:Patient Re-evaluated prior to induction Oxygen Delivery Method: Nasal cannula Preoxygenation: Pre-oxygenation with 100% oxygen Induction Type: IV induction

## 2019-11-11 NOTE — Anesthesia Preprocedure Evaluation (Signed)
Anesthesia Evaluation  Patient identified by MRN, date of birth, ID band Patient awake    Reviewed: Allergy & Precautions, NPO status , Patient's Chart, lab work & pertinent test results  History of Anesthesia Complications Negative for: history of anesthetic complications  Airway Mallampati: II  TM Distance: >3 FB Neck ROM: Full    Dental  (+) Edentulous Upper, Edentulous Lower   Pulmonary neg pulmonary ROS, neg sleep apnea, neg COPD,    breath sounds clear to auscultation- rhonchi (-) wheezing      Cardiovascular hypertension, Pt. on medications (-) CAD, (-) Past MI, (-) Cardiac Stents and (-) CABG + dysrhythmias (1st degree AV block)  Rhythm:Regular Rate:Normal - Systolic murmurs and - Diastolic murmurs    Neuro/Psych neg Seizures PSYCHIATRIC DISORDERS Depression negative neurological ROS     GI/Hepatic Neg liver ROS, GERD  ,  Endo/Other  neg diabetesHypothyroidism   Renal/GU negative Renal ROS     Musculoskeletal negative musculoskeletal ROS (+)   Abdominal (+) - obese,   Peds  Hematology negative hematology ROS (+)   Anesthesia Other Findings Past Medical History: No date: Allergy No date: Corn of toe No date: First degree AV block No date: GERD (gastroesophageal reflux disease) No date: Hammer toe of right foot No date: Hyperglycemia No date: Hyperlipidemia No date: Hypertension No date: Loss of appetite No date: Prolapse of urethra No date: Scoliosis (and kyphoscoliosis), idiopathic No date: Thyroid disease No date: Tinnitus of both ears No date: Vitreous floaters of right eye   Reproductive/Obstetrics                             Anesthesia Physical Anesthesia Plan  ASA: II  Anesthesia Plan: General   Post-op Pain Management:    Induction: Intravenous  PONV Risk Score and Plan: 2 and Propofol infusion  Airway Management Planned: Natural Airway  Additional  Equipment:   Intra-op Plan:   Post-operative Plan:   Informed Consent: I have reviewed the patients History and Physical, chart, labs and discussed the procedure including the risks, benefits and alternatives for the proposed anesthesia with the patient or authorized representative who has indicated his/her understanding and acceptance.     Dental advisory given  Plan Discussed with: CRNA and Anesthesiologist  Anesthesia Plan Comments:         Anesthesia Quick Evaluation

## 2019-11-11 NOTE — H&P (Signed)
Cephas Darby, MD [address]  [place]  [place], [place] [address]  Main: [phone]  Fax: [phone] Pager: [phone]  Primary Care Physician:  Steele Sizer, MD Primary Gastroenterologist:  Dr. Cephas Darby  Pre-Procedure History & Physical: HPI:  Maureen [name] is a 74 y.o. female is here for an endoscopy.   Past Medical History:  Diagnosis Date  . Allergy   . Corn of toe   . First degree AV block   . GERD (gastroesophageal reflux disease)   . Hammer toe of right foot   . Hyperglycemia   . Hyperlipidemia   . Hypertension   . Loss of appetite   . Prolapse of urethra   . Scoliosis (and kyphoscoliosis), idiopathic   . Thyroid disease   . Tinnitus of both ears   . Vitreous floaters of right eye     Past Surgical History:  Procedure Laterality Date  . ABDOMINAL HYSTERECTOMY  1989  . APPENDECTOMY  1989  . BREAST EXCISIONAL BIOPSY Bilateral    multiple biopsies negative  . BREAST SURGERY     Several  . COLONOSCOPY WITH PROPOFOL N/A 07/28/2019   Procedure: COLONOSCOPY WITH PROPOFOL;  Surgeon: Lin Landsman, MD;  Location: [facility] ENDOSCOPY;  Service: Gastroenterology;  Laterality: N/A;  . TUMOR EXCISION  1989   same time as hysterectomy    Prior to Admission medications   Medication Sig Start Date End Date Taking? Authorizing Provider  Ascorbic Acid (VITAMIN C) 1000 MG tablet Take 1,000 mg by mouth daily.   Yes [provider]  chlorhexidine (PERIDEX) 0.12 % solution SMARTSIG:0.5 Ounce(s) By Mouth Twice Daily 09/05/19  Yes [provider]  conjugated estrogens (PREMARIN) vaginal cream Place 1 Applicatorful vaginally daily. 01/11/19  Yes Sowles, Drue Stager, MD  famotidine (PEPCID) 40 MG/5ML suspension Take by mouth. 09/25/19  Yes [provider]  Garlic 2536 MG CAPS Take 1 capsule by mouth daily.    Yes [provider]  Iron-Vitamins (GERITOL COMPLETE PO) Take 1 tablet by mouth daily.   Yes [provider]    levothyroxine (SYNTHROID) 88 MCG tablet Take 1 tablet (88 mcg total) by mouth daily before breakfast. 10/24/19  Yes Sowles, Drue Stager, MD  mirtazapine (REMERON) 15 MG tablet Take 1 tablet (15 mg total) by mouth at bedtime. For appetite 10/20/19  Yes Sowles, Drue Stager, MD  omeprazole (PRILOSEC) 40 MG capsule Take 1 capsule (40 mg total) by mouth daily before breakfast. 11/01/19  Yes Jarvis Knodel, Tally Due, MD  TURMERIC CURCUMIN PO Take 550 mg by mouth daily.   Yes [provider]  apixaban (ELIQUIS) 5 MG TABS tablet Take 1 tablet (5 mg total) by mouth 2 (two) times daily. Patient not taking: Reported on 10/20/2019 08/26/19   Nicholes Mango, MD    Allergies as of 11/07/2019 - Review Complete 11/01/2019  Allergen Reaction Noted  . Aspirin  06/26/2015  . Citalopram Other (See Comments) 06/26/2015  . Codeine Nausea And Vomiting 04/02/2015  . Penicillins Rash 04/02/2015    Family History  Problem Relation Age of Onset  . Diabetes Father 31       DM complications  . Hypertension Mother 43       CKD  . Thyroid disease Mother   . Heart failure Mother   . Kidney disease Mother   . Breast cancer Sister 40  . Kidney disease Other   . Breast cancer Maternal Aunt   . Breast cancer Cousin   . Anuerysm Son 29  .  Diabetes Son     Social History   Socioeconomic History  . Marital status: Widowed    Spouse name: Not on file  . Number of children: 3  . Years of education: 78  . Highest education level: 12th grade  Occupational History  . Occupation: Retired  Tobacco Use  . Smoking status: Never Smoker  . Smokeless tobacco: Never Used  . Tobacco comment: smoking cessation materials not required  Substance and Sexual Activity  . Alcohol use: Never    Alcohol/week: 0.0 standard drinks  . Drug use: No  . Sexual activity: Not Currently    Partners: Male  Other Topics Concern  . Not on file  Social History Narrative  . Not on file   Social Determinants of Health   Financial Resource  Strain:   . Difficulty of Paying Living Expenses: Not on file  Food Insecurity:   . Worried About Programme researcher, broadcasting/film/video in the Last Year: Not on file  . Ran Out of Food in the Last Year: Not on file  Transportation Needs:   . Lack of Transportation (Medical): Not on file  . Lack of Transportation (Non-Medical): Not on file  Physical Activity: Unknown  . Days of Exercise per Week: 3 days  . Minutes of Exercise per Session: Not on file  Stress:   . Feeling of Stress : Not on file  Social Connections:   . Frequency of Communication with Friends and Family: Not on file  . Frequency of Social Gatherings with Friends and Family: Not on file  . Attends Religious Services: Not on file  . Active Member of Clubs or Organizations: Not on file  . Attends Banker Meetings: Not on file  . Marital Status: Not on file  Intimate Partner Violence:   . Fear of Current or Ex-Partner: Not on file  . Emotionally Abused: Not on file  . Physically Abused: Not on file  . Sexually Abused: Not on file    Review of Systems: See HPI, otherwise negative ROS  Physical Exam: BP 124/83   Pulse 97   Temp 98.2 F (36.8 C) (Temporal)   Resp 20   Ht 5\' 3"  (1.6 m)   Wt 45.8 kg   SpO2 100%   BMI 17.89 kg/m  General:   Alert,  pleasant and cooperative in NAD Head:  Normocephalic and atraumatic. Neck:  Supple; no masses or thyromegaly. Lungs:  Clear throughout to auscultation.    Heart:  Regular rate and rhythm. Abdomen:  Soft, nontender and nondistended. Normal bowel sounds, without guarding, and without rebound.   Neurologic:  Alert and  oriented x4;  grossly normal neurologically.  Impression/Plan: is here for an endoscopy to be performed for dysphagia  Risks, benefits, limitations, and alternatives regarding  endoscopy have been reviewed with the patient.  Questions have been answered.  All parties agreeable.   [name], MD  11/11/2019, 9:06 AM

## 2019-11-11 NOTE — Transfer of Care (Signed)
Immediate Anesthesia Transfer of Care Note  Patient: Maureen [name]  Procedure(s) Performed: ESOPHAGOGASTRODUODENOSCOPY (EGD) WITH PROPOFOL (N/A )  Patient Location: PACU  Anesthesia Type:General  Level of Consciousness: awake, alert  and oriented  Airway & Oxygen Therapy: Patient Spontanous Breathing and Patient connected to nasal cannula oxygen  Post-op Assessment: Report given to RN and Post -op Vital signs reviewed and stable  Post vital signs: Reviewed and stable  Last Vitals:  Vitals Value Taken Time  BP 98/65 11/11/19 0944  Temp 36.4 C 11/11/19 0944  Pulse 106 11/11/19 0944  Resp 16 11/11/19 0944  SpO2 100 % 11/11/19 0944    Last Pain:  Vitals:   11/11/19 0944  TempSrc: Temporal  PainSc: 0-No pain         Complications: No apparent anesthesia complications

## 2019-11-14 ENCOUNTER — Telehealth: Payer: Self-pay

## 2019-11-14 ENCOUNTER — Ambulatory Visit: Payer: Self-pay

## 2019-11-14 ENCOUNTER — Encounter: Payer: Self-pay | Admitting: *Deleted

## 2019-11-14 LAB — SURGICAL PATHOLOGY

## 2019-11-14 NOTE — Telephone Encounter (Signed)
Pt has been notified of results and verbalized understanding  

## 2019-11-16 ENCOUNTER — Telehealth: Payer: Self-pay | Admitting: Gastroenterology

## 2019-11-16 ENCOUNTER — Other Ambulatory Visit: Payer: Self-pay

## 2019-11-16 NOTE — Telephone Encounter (Signed)
Please advise 

## 2019-11-16 NOTE — Telephone Encounter (Signed)
Refer to [facility] for esophageal manometry Dx: Dysphagia, normal EGD  RV

## 2019-11-16 NOTE — Patient Outreach (Signed)
[facility]) Care Management  11/16/2019  Maureen [name] [birth year] [id number]    Transition of Care Referral  Referral Date:11/08/2019 Referral Source:PAC Coordinator Date of Discharge:unknown Insurance:[employer] Medicare   Outreach attempt #3 to patient. Spoke with patient who reported she was nauseated and not feeling well." she was not up to talking at present. RN CM has made three attempts to talk with patient and no success. Advised patient to call RN CM back when she was able and felt like talking.     Plan: RN CM will close case if no response from letter or return call from patient.    Antionette Fairy, RN,BSN,CCM [facility] Care Management Telephonic Care Management Coordinator Direct Phone: [phone] Toll Free: [phone] Fax: [phone]

## 2019-11-16 NOTE — Telephone Encounter (Signed)
[name] Pricilla Holm is called & is very concerned about his mother. Stating she is not getting any nutrition. Please advise.

## 2019-11-16 NOTE — Telephone Encounter (Signed)
Patient called stating she is having difficulty swallowing,but able to get water down . She states she had a EGD last Friday. Please call & advise.

## 2019-11-18 ENCOUNTER — Other Ambulatory Visit: Payer: Self-pay

## 2019-11-18 DIAGNOSIS — R1310 Dysphagia, unspecified: Secondary | ICD-10-CM

## 2019-11-18 DIAGNOSIS — R1319 Other dysphagia: Secondary | ICD-10-CM

## 2019-11-18 NOTE — Telephone Encounter (Signed)
Yes, I did talk to her son yesterday and advised to increase omeprazole to 40mg  BID and be on pureed/full liquid diet  RV

## 2019-11-18 NOTE — Telephone Encounter (Signed)
-----   Message from Cecelia Byars, [place] sent at 11/18/2019 12:22 PM EST ----- Regarding: RE: Referral to speech therapy Please disregard prior message concerning calling her son, I think you may have already spoken to him. Urgent referral has been placed.  Thanks TG ----- Message ----- From: Toney Reil, MD Sent: 11/17/2019   4:25 PM EST To: Cecelia Byars, RMA Subject: Referral to speech therapy                     Urgent referral to speech therapy Dx: Dysphagia, egd unremarkable Kyphoscoliosis  RV

## 2019-11-18 NOTE — Telephone Encounter (Signed)
Please call pt's son Verner Chol [phone]) and explain process, he is requesting to speak with you concerning his mother's conditions.

## 2019-11-19 ENCOUNTER — Other Ambulatory Visit: Payer: Self-pay

## 2019-11-19 ENCOUNTER — Emergency Department: Payer: Medicare HMO

## 2019-11-19 ENCOUNTER — Emergency Department
Admission: EM | Admit: 2019-11-19 | Discharge: 2019-11-19 | Disposition: A | Discharge status: Home / Self Care | Payer: Medicare HMO | Attending: Emergency Medicine | Admitting: Emergency Medicine

## 2019-11-19 DIAGNOSIS — R1310 Dysphagia, unspecified: Secondary | ICD-10-CM | POA: Diagnosis not present

## 2019-11-19 DIAGNOSIS — J439 Emphysema, unspecified: Secondary | ICD-10-CM | POA: Diagnosis not present

## 2019-11-19 DIAGNOSIS — I499 Cardiac arrhythmia, unspecified: Secondary | ICD-10-CM | POA: Diagnosis not present

## 2019-11-19 DIAGNOSIS — I10 Essential (primary) hypertension: Secondary | ICD-10-CM | POA: Diagnosis not present

## 2019-11-19 DIAGNOSIS — E039 Hypothyroidism, unspecified: Secondary | ICD-10-CM | POA: Diagnosis not present

## 2019-11-19 DIAGNOSIS — R069 Unspecified abnormalities of breathing: Secondary | ICD-10-CM | POA: Diagnosis not present

## 2019-11-19 LAB — COMPREHENSIVE METABOLIC PANEL
ALT: 20 U/L (ref 0–44)
AST: 29 U/L (ref 15–41)
Albumin: 4 g/dL (ref 3.5–5.0)
Alkaline Phosphatase: 46 U/L (ref 38–126)
Anion gap: 9 (ref 5–15)
BUN: <5 mg/dL — ABNORMAL LOW (ref 8–23)
CO2: 30 mmol/L (ref 22–32)
Calcium: 9.2 mg/dL (ref 8.9–10.3)
Chloride: 94 mmol/L — ABNORMAL LOW (ref 98–111)
Creatinine, Ser: 0.57 mg/dL (ref 0.44–1.00)
GFR calc Af Amer: >60 mL/min (ref >60)
GFR calc non Af Amer: >60 mL/min (ref >60)
Glucose, Bld: 87 mg/dL (ref 70–99)
Potassium: 4.1 mmol/L (ref 3.5–5.1)
Sodium: 133 mmol/L — ABNORMAL LOW (ref 135–145)
Total Bilirubin: 0.8 mg/dL (ref 0.3–1.2)
Total Protein: 7 g/dL (ref 6.5–8.1)

## 2019-11-19 LAB — CBC WITH DIFFERENTIAL/PLATELET
Abs Immature Granulocytes: 0 10*3/uL (ref 0.00–0.07)
Basophils Absolute: 0.1 10*3/uL (ref 0.0–0.1)
Basophils Relative: 2 %
Eosinophils Absolute: 0 10*3/uL (ref 0.0–0.5)
Eosinophils Relative: 1 %
HCT: 38.1 % (ref 36.0–46.0)
Hemoglobin: 13 g/dL (ref 12.0–15.0)
Immature Granulocytes: 0 %
Lymphocytes Relative: 50 %
Lymphs Abs: 1.7 10*3/uL (ref 0.7–4.0)
MCH: 30 pg (ref 26.0–34.0)
MCHC: 34.1 g/dL (ref 30.0–36.0)
MCV: 87.8 fL (ref 80.0–100.0)
Monocytes Absolute: 0.4 10*3/uL (ref 0.1–1.0)
Monocytes Relative: 12 %
Neutro Abs: 1.2 10*3/uL — ABNORMAL LOW (ref 1.7–7.7)
Neutrophils Relative %: 35 %
Platelets: 206 10*3/uL (ref 150–400)
RBC: 4.34 MIL/uL (ref 3.87–5.11)
RDW: 14.5 % (ref 11.5–15.5)
WBC: 3.3 10*3/uL — ABNORMAL LOW (ref 4.0–10.5)
nRBC: 0 % (ref 0.0–0.2)

## 2019-11-19 MED ORDER — SODIUM CHLORIDE 0.9 % IV BOLUS
500.0000 mL | Freq: Once | INTRAVENOUS | Status: AC
  Administered 2019-11-19: 500 mL via INTRAVENOUS

## 2019-11-19 MED ORDER — ALUM & MAG HYDROXIDE-SIMETH 200-200-20 MG/5ML PO SUSP
15.0000 mL | Freq: Once | ORAL | Status: AC
  Administered 2019-11-19: 15 mL via ORAL
  Filled 2019-11-19: qty 30

## 2019-11-19 MED ORDER — IOHEXOL 300 MG/ML  SOLN
75.0000 mL | Freq: Once | INTRAMUSCULAR | Status: AC | PRN
  Administered 2019-11-19: 75 mL via INTRAVENOUS

## 2019-11-19 NOTE — ED Triage Notes (Signed)
Pt arrived via ems from home for report of difficulty swallowing that started 1 week ago but got worse today - pt states that she had procedure for esophagus last week and since this has had difficulty swallowing - her provider told her to sit up straighter to assist with swallowing

## 2019-11-19 NOTE — ED Provider Notes (Signed)
[facility] Emergency Department Provider Note       Time seen: ----------------------------------------- 5:45 PM on 11/19/2019 -----------------------------------------   I have reviewed the triage vital signs and the nursing notes.  HISTORY   Chief Complaint Dysphagia    HPI Maureen [name] is a 74 y.o. female with a history of hyperlipidemia, hyperglycemia, hypertension, thyroid disease who presents to the ED for difficulty swallowing that started 1 week ago but got worse today.  Patient states she cannot really swallow water at this time.  She had endoscopy done this past week for same.  She was told the reason she was having trouble swallowing was because of her severe arthritis.  Past Medical History:  Diagnosis Date  . Allergy   . Corn of toe   . First degree AV block   . GERD (gastroesophageal reflux disease)   . Hammer toe of right foot   . Hyperglycemia   . Hyperlipidemia   . Hypertension   . Loss of appetite   . Prolapse of urethra   . Scoliosis (and kyphoscoliosis), idiopathic   . Thyroid disease   . Tinnitus of both ears   . Vitreous floaters of right eye     Patient Active Problem List   Diagnosis Date Noted  . Esophageal dysphagia   . Arrhythmia 08/25/2019  . Rectal bleeding   . Low grade squamous intraepith lesion on cytologic smear cervix (lgsil) 08/04/2018  . Chronic venous insufficiency 07/05/2018  . Lymphedema 07/05/2018  . Hypertension 04/02/2018  . Swelling of limb 04/02/2018  . Depression, major, recurrent, mild (HCC) 12/29/2017  . Leukopenia 05/24/2017  . Allergic rhinitis, seasonal 06/29/2015  . Clavus 06/29/2015  . 1st degree AV block 06/29/2015  . Hammer toe 06/29/2015  . H/O: HTN (hypertension) 06/29/2015  . Blood glucose elevated 06/29/2015  . Floater, vitreous 06/29/2015  . Bilateral tinnitus 06/29/2015  . Hypothyroidism (acquired) 04/02/2015  . Gastroesophageal reflux disease 04/02/2015  . Hammer toe  of right foot 04/02/2015  . Scoliosis of thoracic spine 04/02/2015  . Urethral prolapse 04/02/2015  . Corn of toe 04/02/2015  . Hyperlipidemia 04/02/2015  . Varicose veins of both lower extremities 04/02/2015    Past Surgical History:  Procedure Laterality Date  . ABDOMINAL HYSTERECTOMY  1989  . APPENDECTOMY  1989  . BREAST EXCISIONAL BIOPSY Bilateral    multiple biopsies negative  . BREAST SURGERY     Several  . COLONOSCOPY WITH PROPOFOL N/A 07/28/2019   Procedure: COLONOSCOPY WITH PROPOFOL;  Surgeon: Toney Reil, MD;  Location[facility] ENDOSCOPY;  Service: Gastroenterology;  Laterality: N/A;  . ESOPHAGOGASTRODUODENOSCOPY (EGD) WITH PROPOFOL N/A 11/11/2019   Procedure: ESOPHAGOGASTRODUODENOSCOPY (EGD) WITH PROPOFOL;  Surgeon: Toney Reil, MD;  Location: [facility] ENDOSCOPY;  Service: Gastroenterology;  Laterality: N/A;  . TUMOR EXCISION  1989   same time as hysterectomy    Allergies Aspirin, Citalopram, Codeine, and Penicillins  Social History Social History   Tobacco Use  . Smoking status: Never Smoker  . Smokeless tobacco: Never Used  . Tobacco comment: smoking cessation materials not required  Substance Use Topics  . Alcohol use: Never    Alcohol/week: 0.0 standard drinks  . Drug use: No   Review of Systems Constitutional: Negative for fever. HEENT: Positive for difficulty swallowing Cardiovascular: Negative for chest pain. Respiratory: Negative for shortness of breath. Gastrointestinal: Negative for abdominal pain, vomiting and diarrhea. Musculoskeletal: Negative for back pain. Skin: Negative for rash. Neurological: Negative for headaches, focal weakness or numbness.  All  systems negative/normal/unremarkable except as stated in the HPI  ____________________________________________   PHYSICAL EXAM:  VITAL SIGNS: ED Triage Vitals  Enc Vitals Group     BP 11/19/19 1634 122/76     Pulse Rate 11/19/19 1634 (!) 103     Resp 11/19/19 1634 20     Temp  11/19/19 1634 98.3 F (36.8 C)     Temp Source 11/19/19 1634 Oral     SpO2 11/19/19 1634 100 %     Weight 11/19/19 1633 100 lb (45.4 kg)     Height 11/19/19 1633 5\' 3"  (1.6 m)     Head Circumference --      Peak Flow --      Pain Score 11/19/19 1641 0     Pain Loc --      Pain Edu? --      Excl. in [place]? --    Constitutional: Alert and oriented.  No acute distress Eyes: Conjunctivae are normal. Normal extraocular movements. ENT      Head: Normocephalic and atraumatic.      Nose: No congestion/rhinnorhea.      Mouth/Throat: Mucous membranes are moist.      Neck: No stridor.  Kyphosis is noted Cardiovascular: Normal rate, regular rhythm. No murmurs, rubs, or gallops. Respiratory: Normal respiratory effort without tachypnea nor retractions. Breath sounds are clear and equal bilaterally. No wheezes/rales/rhonchi. Gastrointestinal: Soft and nontender. Normal bowel sounds Musculoskeletal: Nontender with normal range of motion in extremities. No lower extremity tenderness nor edema. Neurologic:  Normal speech and language. No gross focal neurologic deficits are appreciated.  Skin:  Skin is warm, dry and intact. No rash noted. Psychiatric: Mood and affect are normal. Speech and behavior are normal.  ____________________________________________  ED COURSE:  As part of my medical decision making, I reviewed the following data within the [place] History obtained from family if available, nursing notes, old chart and ekg, as well as notes from prior ED visits. Patient presented for difficulty swallowing, we will assess with labs and imaging as indicated at this time.   Procedures  Maureen [name] was evaluated in Emergency Department on 11/19/2019 for the symptoms described in the history of present illness. She was evaluated in the context of the global COVID-19 pandemic, which necessitated consideration that the patient might be at risk for infection with the SARS-CoV-2  virus that causes COVID-19. Institutional protocols and algorithms that pertain to the evaluation of patients at risk for COVID-19 are in a state of rapid change based on information released by regulatory bodies including the CDC and federal and state organizations. These policies and algorithms were followed during the patient's care in the ED.  ____________________________________________   LABS (pertinent positives/negatives)  Labs Reviewed  CBC WITH DIFFERENTIAL/PLATELET - Abnormal; Notable for the following components:      Result Value   WBC 3.3 (*)    Neutro Abs 1.2 (*)    All other components within normal limits  COMPREHENSIVE METABOLIC PANEL - Abnormal; Notable for the following components:   Sodium 133 (*)    Chloride 94 (*)    BUN <5 (*)    All other components within normal limits    RADIOLOGY Images were viewed by me CT chest with contrast IMPRESSION:  1. No acute intrathoracic abnormality.  2. No esophageal wall thickening or evidence of complication post  recent endoscopy. Upper esophagus is mildly patulous.  3. Diffuse aortic tortuosity with atherosclerosis. Mild emphysema.  4. Advanced scoliotic curvature of  the thoracic spine.   Aortic Atherosclerosis (ICD10-I70.0) and Emphysema (ICD10-J43.9).   ____________________________________________   DIFFERENTIAL DIAGNOSIS   Dysphagia, odynophagia, obstruction, achalasia, GERD, peptic ulcer disease, scoliosis  FINAL ASSESSMENT AND PLAN  Dysphagia   Plan: The patient had presented for difficulty swallowing. Patient's labs were overall reassuring. Patient's imaging did not reveal any obvious abnormality.  She was able to drink for me here, I have advised talking to her GI doctor about a feeding tube should she continue to have trouble swallowing.  She is cleared for outpatient follow-up.   Ulice Dash, MD    Note: This note was generated in part or whole with voice recognition software. Voice  recognition is usually quite accurate but there are transcription errors that can and very often do occur. I apologize for any typographical errors that were not detected and corrected.     Emily Filbert, MD 11/19/19 2123

## 2019-11-19 NOTE — ED Notes (Signed)
Discussed pt with Dr Mayford Knife and he reports no labs at this time

## 2019-11-21 ENCOUNTER — Other Ambulatory Visit: Payer: Self-pay

## 2019-11-21 NOTE — Patient Outreach (Signed)
[facility]) Care Management  11/21/2019  EUGENE ZEIDERS 1946-07-28 [id number]   Transition of Care Referral  Referral Date:11/08/2019 Referral Source:PAC Coordinator Date of Discharge:unknown Insurance:[employer]   Multiple attempts to establish contact with patient without success. No response from letter mailed to patient. Case is being closed at this time.    Plan: RN CM will close case at this time.   Antionette Fairy, RN,BSN,CCM [facility] Care Management Telephonic Care Management Coordinator Direct Phone: [phone] Toll Free: [phone] Fax: [phone]

## 2019-11-30 ENCOUNTER — Other Ambulatory Visit: Payer: Self-pay

## 2019-11-30 ENCOUNTER — Ambulatory Visit (INDEPENDENT_AMBULATORY_CARE_PROVIDER_SITE_OTHER): Payer: Medicare HMO | Admitting: Family Medicine

## 2019-11-30 ENCOUNTER — Encounter: Payer: Self-pay | Admitting: Family Medicine

## 2019-11-30 VITALS — BP 130/84 | HR 107 | Temp 96.8°F | Resp 16 | Ht 64.0 in | Wt 106.8 lb

## 2019-11-30 DIAGNOSIS — R1310 Dysphagia, unspecified: Secondary | ICD-10-CM

## 2019-11-30 DIAGNOSIS — E440 Moderate protein-calorie malnutrition: Secondary | ICD-10-CM | POA: Diagnosis not present

## 2019-11-30 DIAGNOSIS — D709 Neutropenia, unspecified: Secondary | ICD-10-CM

## 2019-11-30 DIAGNOSIS — F331 Major depressive disorder, recurrent, moderate: Secondary | ICD-10-CM | POA: Diagnosis not present

## 2019-11-30 DIAGNOSIS — I700 Atherosclerosis of aorta: Secondary | ICD-10-CM

## 2019-11-30 DIAGNOSIS — E039 Hypothyroidism, unspecified: Secondary | ICD-10-CM | POA: Diagnosis not present

## 2019-11-30 DIAGNOSIS — E871 Hypo-osmolality and hyponatremia: Secondary | ICD-10-CM | POA: Diagnosis not present

## 2019-11-30 DIAGNOSIS — R1319 Other dysphagia: Secondary | ICD-10-CM

## 2019-11-30 NOTE — Progress Notes (Addendum)
Name: Maureen [name]   MRN: [id number]    DOB: 1945-12-12   Date:11/30/2019       Progress Note  Subjective  Chief Complaint  Chief Complaint  Patient presents with  . Follow-up    6 week F/U  . Moderate protein-calorie malnutrition (HCC)  . Mild episode of recurrent major depressive disorder [facility])    HPI  Dysphagia: Maureen [name] continues to have dysphagia, she went back to [facility] on 11/19/2019 because she could not even swallow water. Symptoms started fall 2020 and is getting progressively worse with multiple visit to [facility]. She has had barium swallow study, CXR, CT neck and also chest, also had EGD. Most recent  CT chest showed patulous esophagus, atherosclerosis of aorta, scoliosis thoracic spine and mild emphysema, EGD showed mild non obstructing Schatzki ring and it was dilated, she also has a very small hiatal hernia. Patient was referred to dietician to discuss meal options but she did not go for the appointment, she has lost another 13 lbs in the past 6 weeks. She states Dr. Allegra [name] gave her papers to see another GI at [facility] for second opinion but she has not made the appointment yet. She has lost 27 lbs in the past 11 months.   Patient Active Problem List   Diagnosis Date Noted  . Esophageal dysphagia   . Arrhythmia 08/25/2019  . Rectal bleeding   . Low grade squamous intraepith lesion on cytologic smear cervix (lgsil) 08/04/2018  . Chronic venous insufficiency 07/05/2018  . Lymphedema 07/05/2018  . Hypertension 04/02/2018  . Swelling of limb 04/02/2018  . Depression, major, recurrent, mild (HCC) 12/29/2017  . Leukopenia 05/24/2017  . Allergic rhinitis, seasonal 06/29/2015  . Clavus 06/29/2015  . 1st degree AV block 06/29/2015  . Hammer toe 06/29/2015  . H/O: HTN (hypertension) 06/29/2015  . Blood glucose elevated 06/29/2015  . Floater, vitreous 06/29/2015  . Bilateral tinnitus 06/29/2015  . Hypothyroidism (acquired) 04/02/2015  . Gastroesophageal reflux disease 04/02/2015  . Hammer  toe of right foot 04/02/2015  . Scoliosis of thoracic spine 04/02/2015  . Urethral prolapse 04/02/2015  . Corn of toe 04/02/2015  . Hyperlipidemia 04/02/2015  . Varicose veins of both lower extremities 04/02/2015    Past Surgical History:  Procedure Laterality Date  . ABDOMINAL HYSTERECTOMY  1989  . APPENDECTOMY  1989  . BREAST EXCISIONAL BIOPSY Bilateral    multiple biopsies negative  . BREAST SURGERY     Several  . COLONOSCOPY WITH PROPOFOL N/A 07/28/2019   Procedure: COLONOSCOPY WITH PROPOFOL;  Surgeon: Toney Reil, MD;  Location[facility] ENDOSCOPY;  Service: Gastroenterology;  Laterality: N/A;  . ESOPHAGOGASTRODUODENOSCOPY (EGD) WITH PROPOFOL N/A 11/11/2019   Procedure: ESOPHAGOGASTRODUODENOSCOPY (EGD) WITH PROPOFOL;  Surgeon: Toney Reil, MD;  Location: [facility] ENDOSCOPY;  Service: Gastroenterology;  Laterality: N/A;  . TUMOR EXCISION  1989   same time as hysterectomy    Family History  Problem Relation Age of Onset  . Diabetes Father 15       DM complications  . Hypertension Mother 28       CKD  . Thyroid disease Mother   . Heart failure Mother   . Kidney disease Mother   . Breast cancer Sister 82  . Kidney disease Other   . Breast cancer Maternal Aunt   . Breast cancer Cousin   . Anuerysm Son 51  . Diabetes Son      Current Outpatient Medications:  .  Ascorbic Acid (VITAMIN C) 1000 MG tablet, Take  1,000 mg by mouth daily., Disp: , Rfl:  .  chlorhexidine (PERIDEX) 0.12 % solution, SMARTSIG:0.5 Ounce(s) By Mouth Twice Daily, Disp: , Rfl:  .  conjugated estrogens (PREMARIN) vaginal cream, Place 1 Applicatorful vaginally daily., Disp: 42.5 g, Rfl: 12 .  famotidine (PEPCID) 40 MG/5ML suspension, Take by mouth., Disp: , Rfl:  .  Garlic 1000 MG CAPS, Take 1 capsule by mouth daily. , Disp: , Rfl:  .  Iron-Vitamins (GERITOL COMPLETE PO), Take 1 tablet by mouth daily., Disp: , Rfl:  .  levothyroxine (SYNTHROID) 88 MCG tablet, Take 1 tablet (88 mcg total) by mouth  daily before breakfast., Disp: 90 tablet, Rfl: 0 .  mirtazapine (REMERON) 15 MG tablet, Take 1 tablet (15 mg total) by mouth at bedtime. For appetite, Disp: 90 tablet, Rfl: 0 .  omeprazole (PRILOSEC) 40 MG capsule, Take 1 capsule (40 mg total) by mouth daily before breakfast., Disp: 90 capsule, Rfl: 3 .  TURMERIC CURCUMIN PO, Take 550 mg by mouth daily., Disp: , Rfl:   Allergies  Allergen Reactions  . Aspirin     Tinnitus  . Citalopram Other (See Comments)  . Codeine Nausea And Vomiting  . Penicillins Rash    Has patient had a PCN reaction causing immediate rash, facial/tongue/throat swelling, SOB or lightheadedness with hypotension: Yes Has patient had a PCN reaction causing severe rash involving mucus membranes or skin necrosis: No Has patient had a PCN reaction that required hospitalization No Has patient had a PCN reaction occurring within the last 10 years: No If all of the above answers are "NO", then may proceed with Cephalosporin use.    I personally reviewed active problem list, medication list, allergies, family history, social history with the patient/caregiver today.   ROS  Constitutional: Negative for fever , positive for weight change.  Respiratory: Negative for cough and shortness of breath.   Cardiovascular: Negative for chest pain or palpitations.  Gastrointestinal: Negative for abdominal pain, no bowel changes.  Musculoskeletal: Negative for gait problem or joint swelling.  Skin: Negative for rash.  Neurological: Negative for dizziness or headache.  No other specific complaints in a complete review of systems (except as listed in HPI above).  Objective  Vitals:   11/30/19 1349  BP: 130/84  Pulse: (!) 107  Resp: 16  Temp: (!) 96.8 F (36 C)  TempSrc: Temporal  SpO2: 99%  Weight: 106 lb 12.8 oz (48.4 kg)  Height: 5\' 4"  (1.626 m)    Body mass index is 18.33 kg/m.  Physical Exam  Constitutional: Patient appears very frail, malnourished, temporal  waisting. No distress.  HEENT: head atraumatic, normocephalic, pupils equal and reactive to light Cardiovascular: Normal rate, regular rhythm and normal heart sounds.  No murmur heard. No BLE edema. Pulmonary/Chest: Effort normal and breath sounds normal. No respiratory distress. Abdominal: Soft.  There is no tenderness. Psychiatric: Patient has a normal mood and affect. behavior is normal. Judgment and thought content normal.  Recent Results (from the past 2160 hour(s))  Basic metabolic panel     Status: Abnormal   Collection Time: 09/06/19  5:11 AM  Result Value Ref Range   Sodium 137 135 - 145 mmol/L   Potassium 4.6 3.5 - 5.1 mmol/L   Chloride 98 98 - 111 mmol/L   CO2 29 22 - 32 mmol/L   Glucose, Bld 115 (H) 70 - 99 mg/dL   BUN 10 8 - 23 mg/dL   Creatinine, Ser 13/10/20 0.44 - 1.00 mg/dL   Calcium 9.4 8.9 -  10.3 mg/dL   GFR calc non Af Amer >60 >60 mL/min   GFR calc Af Amer >60 >60 mL/min   Anion gap 10 5 - 15    Comment: Performed at [facility], [address] [place], [place] [address]  CBC     Status: None   Collection Time: 09/06/19  5:11 AM  Result Value Ref Range   WBC 5.4 4.0 - 10.5 K/uL   RBC 4.42 3.87 - 5.11 MIL/uL   Hemoglobin 12.7 12.0 - 15.0 g/dL   HCT 11.938.9 14.736.0 - 82.946.0 %   MCV 88.0 80.0 - 100.0 fL   MCH 28.7 26.0 - 34.0 pg   MCHC 32.6 30.0 - 36.0 g/dL   RDW 56.214.6 13.011.5 - 86.515.5 %   Platelets 229 150 - 400 K/uL   nRBC 0.0 0.0 - 0.2 %    Comment: Performed at [facility], [address] [place], [place] [address]  Troponin I (High Sensitivity)     Status: None   Collection Time: 09/06/19  5:11 AM  Result Value Ref Range   Troponin I (High Sensitivity) 5 <18 ng/L    Comment: (NOTE) Elevated high sensitivity troponin I (hsTnI) values and significant  changes across serial measurements may suggest ACS but many other  chronic and acute conditions are known to elevate hsTnI results.  Refer to the "Links" section for chest pain algorithms and  additional  guidance. Performed at [facility], [address] [place], [place] [address]   Troponin I (High Sensitivity)     Status: None   Collection Time: 09/06/19  7:40 AM  Result Value Ref Range   Troponin I (High Sensitivity) 5 <18 ng/L    Comment: (NOTE) Elevated high sensitivity troponin I (hsTnI) values and significant  changes across serial measurements may suggest ACS but many other  chronic and acute conditions are known to elevate hsTnI results.  Refer to the "Links" section for chest pain algorithms and additional  guidance. Performed at [facility], [address] [place], [place] [address]   Comprehensive metabolic panel     Status: Abnormal   Collection Time: 09/22/19 12:45 PM  Result Value Ref Range   Sodium 133 (L) 135 - 145 mmol/L   Potassium 4.7 3.5 - 5.1 mmol/L   Chloride 95 (L) 98 - 111 mmol/L   CO2 27 22 - 32 mmol/L   Glucose, Bld 176 (H) 70 - 99 mg/dL   BUN 11 8 - 23 mg/dL   Creatinine, Ser 4.400.82 0.44 - 1.00 mg/dL   Calcium 9.1 8.9 - 10.210.3 mg/dL   Total Protein 7.9 6.5 - 8.1 g/dL   Albumin 4.4 3.5 - 5.0 g/dL   AST 38 15 - 41 U/L   ALT 22 0 - 44 U/L   Alkaline Phosphatase 62 38 - 126 U/L   Total Bilirubin 1.0 0.3 - 1.2 mg/dL   GFR calc non Af Amer >60 >60 mL/min   GFR calc Af Amer >60 >60 mL/min   Anion gap 11 5 - 15    Comment: Performed at [facility], [address] [place], [place] [address]  CBC     Status: Abnormal   Collection Time: 09/22/19 12:45 PM  Result Value Ref Range   WBC 3.8 (L) 4.0 - 10.5 K/uL   RBC 4.45 3.87 - 5.11 MIL/uL   Hemoglobin 13.1 12.0 - 15.0 g/dL   HCT 64.439.3 03.436.0 - 74.246.0 %   MCV 88.3 80.0 - 100.0  fL   MCH 29.4 26.0 - 34.0 pg   MCHC 33.3 30.0 - 36.0 g/dL   RDW 96.014.6 45.411.5 - 09.815.5 %   Platelets 205 150 - 400 K/uL   nRBC 0.0 0.0 - 0.2 %    Comment: Performed at [facility], [address] [place], [place] [address]  Magnesium     Status: None   Collection Time: 09/22/19   2:09 PM  Result Value Ref Range   Magnesium 2.3 1.7 - 2.4 mg/dL    Comment: Performed at [facility], [address] [place], [place] [address]  Troponin I (High Sensitivity)     Status: None   Collection Time: 09/22/19  2:09 PM  Result Value Ref Range   Troponin I (High Sensitivity) 6 <18 ng/L    Comment: (NOTE) Elevated high sensitivity troponin I (hsTnI) values and significant  changes across serial measurements may suggest ACS but many other  chronic and acute conditions are known to elevate hsTnI results.  Refer to the "Links" section for chest pain algorithms and additional  guidance. Performed at [facility], [address] [place], [place] [address]   CBC     Status: None   Collection Time: 09/24/19  4:16 PM  Result Value Ref Range   WBC 4.5 4.0 - 10.5 K/uL   RBC 4.72 3.87 - 5.11 MIL/uL   Hemoglobin 13.7 12.0 - 15.0 g/dL   HCT 86.539.7 78.436.0 - 69.646.0 %   MCV 84.1 80.0 - 100.0 fL   MCH 29.0 26.0 - 34.0 pg   MCHC 34.5 30.0 - 36.0 g/dL   RDW 29.514.6 28.411.5 - 13.215.5 %   Platelets 210 150 - 400 K/uL   nRBC 0.0 0.0 - 0.2 %    Comment: Performed at [facility], [address] [place], [place] [address]  CMP     Status: Abnormal   Collection Time: 09/24/19  4:16 PM  Result Value Ref Range   Sodium 133 (L) 135 - 145 mmol/L   Potassium 4.2 3.5 - 5.1 mmol/L   Chloride 94 (L) 98 - 111 mmol/L   CO2 23 22 - 32 mmol/L   Glucose, Bld 69 (L) 70 - 99 mg/dL   BUN 9 8 - 23 mg/dL   Creatinine, Ser 2.720.70 0.44 - 1.00 mg/dL   Calcium 9.2 8.9 - 53.610.3 mg/dL   Total Protein 8.3 (H) 6.5 - 8.1 g/dL   Albumin 4.7 3.5 - 5.0 g/dL   AST 37 15 - 41 U/L   ALT 23 0 - 44 U/L   Alkaline Phosphatase 68 38 - 126 U/L   Total Bilirubin 1.4 (H) 0.3 - 1.2 mg/dL   GFR calc non Af Amer >60 >60 mL/min   GFR calc Af Amer >60 >60 mL/min   Anion gap 16 (H) 5 - 15    Comment: Performed at [facility], [address] [place], [place] [address]  HIV Antibody (routine testing w  rflx)     Status: None   Collection Time: 10/20/19 12:00 AM  Result Value Ref Range   HIV 1&2 Ab, 4th Generation NON-REACTIVE NON-REACTI    Comment: HIV-1 antigen and HIV-1/HIV-2 antibodies were not detected. There is no laboratory evidence of HIV infection. Marland Kitchen. PLEASE NOTE: This information has been disclosed to you from records whose confidentiality may be protected by state law.  If your state requires such protection, then the state law prohibits you from making any further disclosure of the information without  the specific written consent of the person to whom it pertains, or as otherwise permitted by law. A general authorization for the release of medical or other information is NOT sufficient for this purpose. . For additional information please refer to http://education.questdiagnostics.com/faq/FAQ106 (This link is being provided for informational/ educational purposes only.) . Marland Kitchen The performance of this assay has not been clinically validated in patients less than 79 years old. .   C-reactive protein     Status: None   Collection Time: 10/20/19 12:00 AM  Result Value Ref Range   CRP 2.1 <8.0 mg/L  Sedimentation rate     Status: None   Collection Time: 10/20/19 12:00 AM  Result Value Ref Range   Sed Rate 17 0 - 30 mm/h  TSH     Status: Abnormal   Collection Time: 10/20/19 12:00 AM  Result Value Ref Range   TSH 4.58 (H) 0.40 - 4.50 mIU/L  BASIC METABOLIC PANEL WITH GFR     Status: Abnormal   Collection Time: 10/20/19 12:00 AM  Result Value Ref Range   Glucose, Bld 99 65 - 99 mg/dL    Comment: .            Fasting reference interval .    BUN 5 (L) 7 - 25 mg/dL   Creat 1.61 0.96 - 0.45 mg/dL    Comment: For patients >20 years of age, the reference limit for Creatinine is approximately 13% higher for people identified as African-American. .    GFR, Est Non African American 87 > OR = 60 mL/min/1.66m2   GFR, Est African American 101 > OR = 60 mL/min/1.8m2    BUN/Creatinine Ratio 7 6 - 22 (calc)   Sodium 133 (L) 135 - 146 mmol/L   Potassium 4.1 3.5 - 5.3 mmol/L   Chloride 92 (L) 98 - 110 mmol/L   CO2 28 20 - 32 mmol/L   Calcium 9.5 8.6 - 10.4 mg/dL  Basic metabolic panel     Status: Abnormal   Collection Time: 10/30/19  6:20 PM  Result Value Ref Range   Sodium 131 (L) 135 - 145 mmol/L   Potassium 4.9 3.5 - 5.1 mmol/L   Chloride 92 (L) 98 - 111 mmol/L   CO2 27 22 - 32 mmol/L   Glucose, Bld 100 (H) 70 - 99 mg/dL   BUN <5 (L) 8 - 23 mg/dL   Creatinine, Ser 4.09 0.44 - 1.00 mg/dL   Calcium 9.4 8.9 - 81.1 mg/dL   GFR calc non Af Amer >60 >60 mL/min   GFR calc Af Amer >60 >60 mL/min   Anion gap 12 5 - 15    Comment: Performed at [facility], [address] [place], [place] [address]  CBC     Status: Abnormal   Collection Time: 10/30/19  6:20 PM  Result Value Ref Range   WBC 3.6 (L) 4.0 - 10.5 K/uL   RBC 4.13 3.87 - 5.11 MIL/uL   Hemoglobin 12.1 12.0 - 15.0 g/dL   HCT 29.5 (L) 62.1 - 30.8 %   MCV 86.9 80.0 - 100.0 fL   MCH 29.3 26.0 - 34.0 pg   MCHC 33.7 30.0 - 36.0 g/dL   RDW 65.7 84.6 - 96.2 %   Platelets 207 150 - 400 K/uL   nRBC 0.0 0.0 - 0.2 %    Comment: Performed at [facility], [address] [place], [place] [address]  Troponin I (High Sensitivity)     Status: None  Collection Time: 10/30/19  6:20 PM  Result Value Ref Range   Troponin I (High Sensitivity) 4 <18 ng/L    Comment: (NOTE) Elevated high sensitivity troponin I (hsTnI) values and significant  changes across serial measurements may suggest ACS but many other  chronic and acute conditions are known to elevate hsTnI results.  Refer to the "Links" section for chest pain algorithms and additional  guidance. Performed at [facility], [address] [place], [place] [address]   Troponin I (High Sensitivity)     Status: None   Collection Time: 10/30/19  8:40 PM  Result Value Ref Range   Troponin I (High Sensitivity) 5 <18 ng/L     Comment: (NOTE) Elevated high sensitivity troponin I (hsTnI) values and significant  changes across serial measurements may suggest ACS but many other  chronic and acute conditions are known to elevate hsTnI results.  Refer to the "Links" section for chest pain algorithms and additional  guidance. Performed at [facility], [address] [place], [place] [address]   SARS CORONAVIRUS 2 (TAT 6-24 HRS) Nasopharyngeal Nasopharyngeal Swab     Status: None   Collection Time: 11/08/19 12:02 PM   Specimen: Nasopharyngeal Swab  Result Value Ref Range   SARS Coronavirus 2 NEGATIVE NEGATIVE    Comment: (NOTE) SARS-CoV-2 target nucleic acids are NOT DETECTED. The SARS-CoV-2 RNA is generally detectable in upper and lower respiratory specimens during the acute phase of infection. Negative results do not preclude SARS-CoV-2 infection, do not rule out co-infections with other pathogens, and should not be used as the sole basis for treatment or other patient management decisions. Negative results must be combined with clinical observations, patient history, and epidemiological information. The expected result is Negative. Fact Sheet for Patients: HairSlick.no Fact Sheet for Healthcare Providers: quierodirigir.com This test is not yet approved or cleared by the [place] FDA and  has been authorized for detection and/or diagnosis of SARS-CoV-2 by FDA under an Emergency Use Authorization (EUA). This EUA will remain  in effect (meaning this test can be used) for the duration of the COVID-19 declaration under Section 56 4(b)(1) of the Act, 21 U.S.C. section 360bbb-3(b)(1), unless the authorization is terminated or revoked sooner. Performed at [facility], [address] [place], [place] [address]   Surgical pathology     Status: None   Collection Time: 11/11/19  9:36 AM  Result Value Ref Range   SURGICAL PATHOLOGY       SURGICAL PATHOLOGY CASE: [id number] PATIENT: Anarosa Milner Surgical Pathology Report     Specimen Submitted: A. Esophagus; cbx  Clinical History: Esophageal dysphagia R13.10. Hiatal hernia; Schatzki ring.     DIAGNOSIS: A.  ESOPHAGUS; COLD BIOPSY: - STRATIFIED SQUAMOUS EPITHELIUM WITH A FOCAL AREA OF REACTIVE PARAKERATOSIS AND SPONGIOSIS SUGGESTIVE OF REFLUX ESOPHAGITIS. - NEGATIVE FOR INTRAEPITHELIAL EOSINOPHILS, INFECTIOUS AGENTS, DYSPLASIA AND MALIGNANCY.  GROSS DESCRIPTION: A. Labeled: C BX esophagus R/T dysphagia Received: Formalin Tissue fragment(s): Multiple Size: Aggregate, 0.7 x 0.2 x 0.1 cm Description: Pale-tan soft tissue fragments Entirely submitted in 1 cassette.    Final Diagnosis performed by Ronald Lobo, MD.   Electronically signed 11/14/2019 9:19:07AM The electronic signature indicates that the named Attending Pathologist has evaluated the specimen Technical component performed at [place], [address], [place], [place] [address] L ab: [phone] Dir: Jolene Schimke, MD, MMM  Professional component performed at [facility], [address], [place], [place] [address] Lab: [phone] Dir: Georgiann Cocker. Oneita Kras, MD  CBC with Differential/Platelet     Status: Abnormal   Collection Time: 11/19/19  5:58 PM  Result Value Ref Range   WBC 3.3 (L) 4.0 - 10.5 K/uL   RBC 4.34 3.87 - 5.11 MIL/uL   Hemoglobin 13.0 12.0 - 15.0 g/dL   HCT 38.1 36.0 - 46.0 %   MCV 87.8 80.0 - 100.0 fL   MCH 30.0 26.0 - 34.0 pg   MCHC 34.1 30.0 - 36.0 g/dL   RDW 14.5 11.5 - 15.5 %   Platelets 206 150 - 400 K/uL   nRBC 0.0 0.0 - 0.2 %   Neutrophils Relative % 35 %   Neutro Abs 1.2 (L) 1.7 - 7.7 K/uL   Lymphocytes Relative 50 %   Lymphs Abs 1.7 0.7 - 4.0 K/uL   Monocytes Relative 12 %   Monocytes Absolute 0.4 0.1 - 1.0 K/uL   Eosinophils Relative 1 %   Eosinophils Absolute 0.0 0.0 - 0.5 K/uL   Basophils Relative 2 %   Basophils Absolute 0.1  0.0 - 0.1 K/uL   Immature Granulocytes 0 %   Abs Immature Granulocytes 0.00 0.00 - 0.07 K/uL    Comment: Performed at [facility], [address] [place], [place] [address]  Comprehensive metabolic panel     Status: Abnormal   Collection Time: 11/19/19  6:40 PM  Result Value Ref Range   Sodium 133 (L) 135 - 145 mmol/L   Potassium 4.1 3.5 - 5.1 mmol/L   Chloride 94 (L) 98 - 111 mmol/L   CO2 30 22 - 32 mmol/L   Glucose, Bld 87 70 - 99 mg/dL   BUN <5 (L) 8 - 23 mg/dL   Creatinine, Ser 0.57 0.44 - 1.00 mg/dL   Calcium 9.2 8.9 - 10.3 mg/dL   Total Protein 7.0 6.5 - 8.1 g/dL   Albumin 4.0 3.5 - 5.0 g/dL   AST 29 15 - 41 U/L   ALT 20 0 - 44 U/L   Alkaline Phosphatase 46 38 - 126 U/L   Total Bilirubin 0.8 0.3 - 1.2 mg/dL   GFR calc non Af Amer >60 >60 mL/min   GFR calc Af Amer >60 >60 mL/min   Anion gap 9 5 - 15    Comment: Performed at [facility], [address] [place], [place] [address]     PHQ2/9: Depression screen [facility] 2/9 11/30/2019 10/20/2019 09/28/2019 09/09/2019 08/17/2019  Decreased Interest 2 0 0 0 2  Down, Depressed, Hopeless 1 1 1  0 2  PHQ - 2 Score 3 1 1  0 4  Altered sleeping 1 0 1 0 2  Tired, decreased energy 2 0 3 0 2  Change in appetite 2 1 3  0 2  Feeling bad or failure about yourself  1 0 0 0 0  Trouble concentrating 2 0 3 0 0  Moving slowly or fidgety/restless 1 0 0 0 0  Suicidal thoughts 0 0 0 0 0  PHQ-9 Score 12 2 11  0 10  Difficult doing work/chores - Not difficult at all Somewhat difficult Not difficult at all Not difficult at all  Some recent data might be hidden    phq 9 is positive   GAD 7 : Generalized Anxiety Score 11/30/2019 01/11/2019 09/30/2018 06/02/2016  Nervous, Anxious, on Edge 1 0 0 3  Control/stop worrying 1 0 1 3  Worry too much - different things 2 1 1 3   Trouble relaxing 1 0 0 0  Restless 1 0 0 1  Easily  annoyed or irritable 1 1 0 3  Afraid - awful might happen 0 1 0 3  Total GAD 7 Score 7 3 2 16   Anxiety Difficulty  - Not difficult at all Not difficult at all Very difficult     Fall Risk: Fall Risk  11/30/2019 10/20/2019 09/28/2019 09/09/2019 08/05/2019  Falls in the past year? 0 0 0 0 0  Number falls in past yr: 0 0 0 0 0  Injury with Fall? 0 - 0 0 0  Risk for fall due to : - - - - -  Risk for fall due to: Comment - - - - -  Follow up - Falls evaluation completed - Falls evaluation completed Falls prevention discussed     Functional Status Survey: Is the patient deaf or have difficulty hearing?: No Does the patient have difficulty seeing, even when wearing glasses/contacts?: No Does the patient have difficulty concentrating, remembering, or making decisions?: No Does the patient have difficulty walking or climbing stairs?: No Does the patient have difficulty dressing or bathing?: No Does the patient have difficulty doing errands alone such as visiting a doctor's office or shopping?: No    Assessment & Plan  1. Hyponatremia  Likely from malnutrition   2. Neutropenia, unspecified type (HCC)  Recheck next visit   3. Esophageal dysphagia  Going for second opinion  4. Atherosclerosis of aorta (HCC)  We will hold off on statin therapy   5. Moderate episode of recurrent major depressive disorder (HCC)  She refuses medication   6. Hypothyroidism (acquired)  - TSH  7. Moderate malnutrition (HCC)  - Anti-Scleroderma Antibody - C-reactive protein - Sedimentation rate

## 2019-11-30 NOTE — Addendum Note (Signed)
Addended by: Alba Cory F on: 11/30/2019 02:24 PM   Modules accepted: Orders

## 2019-12-01 LAB — ANTI-SCLERODERMA ANTIBODY: Scleroderma (Scl-70) (ENA) Antibody, IgG: <1 AI

## 2019-12-01 LAB — SEDIMENTATION RATE: Sed Rate: 17 mm/h (ref 0–30)

## 2019-12-01 LAB — C-REACTIVE PROTEIN: CRP: 1.8 mg/L (ref <8.0)

## 2019-12-01 LAB — TSH: TSH: 7.92 mIU/L — ABNORMAL HIGH (ref 0.40–4.50)

## 2019-12-06 ENCOUNTER — Other Ambulatory Visit: Payer: Self-pay | Admitting: Family Medicine

## 2019-12-06 ENCOUNTER — Other Ambulatory Visit: Payer: Self-pay

## 2019-12-06 DIAGNOSIS — E039 Hypothyroidism, unspecified: Secondary | ICD-10-CM

## 2019-12-13 ENCOUNTER — Ambulatory Visit: Payer: Medicare HMO | Admitting: Gastroenterology

## 2019-12-15 ENCOUNTER — Telehealth: Payer: Self-pay | Admitting: Family Medicine

## 2019-12-15 NOTE — Telephone Encounter (Signed)
Copied from CRM [id number]. Topic: Referral - Request for Referral >> Dec 15, 2019  1:14 PM Maureen [name] wrote: Has patient seen PCP for this complaint? Yes *If NO, is insurance requiring patient see PCP for this issue before PCP can refer them? Referral for which specialty: Psychiatry or Occupational Therapist Preferred provider/office: [facility] Occupational Maureen [name] [phone] in [facility] or Maureen [name] [phone] Reason for referral:weight loss from not eating; patient stated that she needs assistance with eating Please contact [phone] Maureen [name] in regards to referral

## 2019-12-16 ENCOUNTER — Encounter: Payer: Self-pay | Admitting: Emergency Medicine

## 2019-12-16 ENCOUNTER — Emergency Department: Payer: Medicare HMO

## 2019-12-16 ENCOUNTER — Emergency Department
Admission: EM | Admit: 2019-12-16 | Discharge: 2019-12-17 | Disposition: A | Discharge status: Home / Self Care | Payer: Medicare HMO | Attending: Emergency Medicine | Admitting: Emergency Medicine

## 2019-12-16 ENCOUNTER — Other Ambulatory Visit: Payer: Self-pay

## 2019-12-16 DIAGNOSIS — I10 Essential (primary) hypertension: Secondary | ICD-10-CM | POA: Insufficient documentation

## 2019-12-16 DIAGNOSIS — R1310 Dysphagia, unspecified: Secondary | ICD-10-CM

## 2019-12-16 DIAGNOSIS — E871 Hypo-osmolality and hyponatremia: Secondary | ICD-10-CM | POA: Insufficient documentation

## 2019-12-16 DIAGNOSIS — Z79899 Other long term (current) drug therapy: Secondary | ICD-10-CM | POA: Diagnosis not present

## 2019-12-16 DIAGNOSIS — R0602 Shortness of breath: Secondary | ICD-10-CM | POA: Diagnosis not present

## 2019-12-16 DIAGNOSIS — R142 Eructation: Secondary | ICD-10-CM | POA: Diagnosis present

## 2019-12-16 LAB — BASIC METABOLIC PANEL
Anion gap: 17 — ABNORMAL HIGH (ref 5–15)
BUN: 6 mg/dL — ABNORMAL LOW (ref 8–23)
CO2: 24 mmol/L (ref 22–32)
Calcium: 9 mg/dL (ref 8.9–10.3)
Chloride: 86 mmol/L — ABNORMAL LOW (ref 98–111)
Creatinine, Ser: 0.71 mg/dL (ref 0.44–1.00)
GFR calc Af Amer: >60 mL/min (ref >60)
GFR calc non Af Amer: >60 mL/min (ref >60)
Glucose, Bld: 70 mg/dL (ref 70–99)
Potassium: 3.7 mmol/L (ref 3.5–5.1)
Sodium: 127 mmol/L — ABNORMAL LOW (ref 135–145)

## 2019-12-16 LAB — CBC WITH DIFFERENTIAL/PLATELET
Abs Immature Granulocytes: 0.01 10*3/uL (ref 0.00–0.07)
Basophils Absolute: 0 10*3/uL (ref 0.0–0.1)
Basophils Relative: 1 %
Eosinophils Absolute: 0 10*3/uL (ref 0.0–0.5)
Eosinophils Relative: 1 %
HCT: 36.4 % (ref 36.0–46.0)
Hemoglobin: 12.6 g/dL (ref 12.0–15.0)
Immature Granulocytes: 0 %
Lymphocytes Relative: 45 %
Lymphs Abs: 1.5 10*3/uL (ref 0.7–4.0)
MCH: 29.9 pg (ref 26.0–34.0)
MCHC: 34.6 g/dL (ref 30.0–36.0)
MCV: 86.5 fL (ref 80.0–100.0)
Monocytes Absolute: 0.5 10*3/uL (ref 0.1–1.0)
Monocytes Relative: 14 %
Neutro Abs: 1.3 10*3/uL — ABNORMAL LOW (ref 1.7–7.7)
Neutrophils Relative %: 39 %
Platelets: 184 10*3/uL (ref 150–400)
RBC: 4.21 MIL/uL (ref 3.87–5.11)
RDW: 14.2 % (ref 11.5–15.5)
WBC: 3.4 10*3/uL — ABNORMAL LOW (ref 4.0–10.5)
nRBC: 0 % (ref 0.0–0.2)

## 2019-12-16 LAB — TROPONIN I (HIGH SENSITIVITY): Troponin I (High Sensitivity): 5 ng/L (ref <18)

## 2019-12-16 MED ORDER — LORAZEPAM 2 MG/ML IJ SOLN
0.5000 mg | Freq: Once | INTRAMUSCULAR | Status: AC
  Administered 2019-12-16: 0.5 mg via INTRAVENOUS
  Filled 2019-12-16: qty 1

## 2019-12-16 MED ORDER — LIDOCAINE VISCOUS HCL 2 % MT SOLN
15.0000 mL | Freq: Once | OROMUCOSAL | Status: AC
  Administered 2019-12-16: 23:00:00 15 mL via OROMUCOSAL
  Filled 2019-12-16: qty 15

## 2019-12-16 NOTE — ED Provider Notes (Signed)
[facility] Emergency Department Provider Note  ____________________________________________   I have reviewed the triage vital signs and the nursing notes.   HISTORY  Chief Complaint Hard time burping  History limited by: Not Limited   HPI Maureen [name] is a 74 y.o. female who presents to the emergency department today because she states she has been having a hard time burping today.  She states that this started after she took her thyroid medication.  She says normally she takes an acid medication afterwards but did not take it after thyroid medication today.  She denied any shortness of breath to me.  Denies any fevers.  Denies any pain.    Records reviewed. Per medical record review patient has a history of issues with dysphagia. Per PCP note 16 days ago she has had symptoms for months and has had significant work up including EGD.   Past Medical History:  Diagnosis Date  . Allergy   . Corn of toe   . First degree AV block   . GERD (gastroesophageal reflux disease)   . Hammer toe of right foot   . Hyperglycemia   . Hyperlipidemia   . Hypertension   . Loss of appetite   . Prolapse of urethra   . Scoliosis (and kyphoscoliosis), idiopathic   . Thyroid disease   . Tinnitus of both ears   . Vitreous floaters of right eye     Patient Active Problem List   Diagnosis Date Noted  . Esophageal dysphagia   . Arrhythmia 08/25/2019  . Rectal bleeding   . Low grade squamous intraepith lesion on cytologic smear cervix (lgsil) 08/04/2018  . Chronic venous insufficiency 07/05/2018  . Lymphedema 07/05/2018  . Hypertension 04/02/2018  . Swelling of limb 04/02/2018  . Depression, major, recurrent, mild ([place]) 12/29/2017  . Leukopenia 05/24/2017  . Allergic rhinitis, seasonal 06/29/2015  . Clavus 06/29/2015  . 1st degree AV block 06/29/2015  . Hammer toe 06/29/2015  . H/O: HTN (hypertension) 06/29/2015  . Blood glucose elevated 06/29/2015  . Floater,  vitreous 06/29/2015  . Bilateral tinnitus 06/29/2015  . Hypothyroidism (acquired) 04/02/2015  . Gastroesophageal reflux disease 04/02/2015  . Hammer toe of right foot 04/02/2015  . Scoliosis of thoracic spine 04/02/2015  . Urethral prolapse 04/02/2015  . Corn of toe 04/02/2015  . Hyperlipidemia 04/02/2015  . Varicose veins of both lower extremities 04/02/2015    Past Surgical History:  Procedure Laterality Date  . ABDOMINAL HYSTERECTOMY  1989  . APPENDECTOMY  1989  . BREAST EXCISIONAL BIOPSY Bilateral    multiple biopsies negative  . BREAST SURGERY     Several  . COLONOSCOPY WITH PROPOFOL N/A 07/28/2019   Procedure: COLONOSCOPY WITH PROPOFOL;  Surgeon: Lin Landsman, MD;  Location: [facility] ENDOSCOPY;  Service: Gastroenterology;  Laterality: N/A;  . ESOPHAGOGASTRODUODENOSCOPY (EGD) WITH PROPOFOL N/A 11/11/2019   Procedure: ESOPHAGOGASTRODUODENOSCOPY (EGD) WITH PROPOFOL;  Surgeon: Lin Landsman, MD;  Location: [facility] ENDOSCOPY;  Service: Gastroenterology;  Laterality: N/A;  . TUMOR EXCISION  1989   same time as hysterectomy    Prior to Admission medications   Medication Sig Start Date End Date Taking? Authorizing Provider  Ascorbic Acid (VITAMIN C) 1000 MG tablet Take 1,000 mg by mouth daily.    [provider]  chlorhexidine (PERIDEX) 0.12 % solution SMARTSIG:0.5 Ounce(s) By Mouth Twice Daily 09/05/19   [provider]  conjugated estrogens (PREMARIN) vaginal cream Place 1 Applicatorful vaginally daily. 01/11/19   Steele Sizer, MD  famotidine (PEPCID)  40 MG/5ML suspension Take by mouth. 09/25/19   [provider]  Garlic 1000 MG CAPS Take 1 capsule by mouth daily.     [provider]  Iron-Vitamins (GERITOL COMPLETE PO) Take 1 tablet by mouth daily.    [provider]  levothyroxine (SYNTHROID) 88 MCG tablet Take 1 tablet (88 mcg total) by mouth daily before breakfast. 10/24/19   Carlynn Purl, Danna Hefty, MD  mirtazapine (REMERON) 15 MG  tablet Take 1 tablet (15 mg total) by mouth at bedtime. For appetite 10/20/19   Alba Cory, MD  omeprazole (PRILOSEC) 40 MG capsule Take 1 capsule (40 mg total) by mouth daily before breakfast. 11/01/19   Vanga, Loel Dubonnet, MD  TURMERIC CURCUMIN PO Take 550 mg by mouth daily.    [provider]    Allergies Aspirin, Citalopram, Codeine, and Penicillins  Family History  Problem Relation Age of Onset  . Diabetes Father 26       DM complications  . Hypertension Mother 15       CKD  . Thyroid disease Mother   . Heart failure Mother   . Kidney disease Mother   . Breast cancer Sister 99  . Kidney disease Other   . Breast cancer Maternal Aunt   . Breast cancer Cousin   . Anuerysm Son 51  . Diabetes Son     Social History Social History   Tobacco Use  . Smoking status: Never Smoker  . Smokeless tobacco: Never Used  . Tobacco comment: smoking cessation materials not required  Substance Use Topics  . Alcohol use: Never    Alcohol/week: 0.0 standard drinks  . Drug use: No    Review of Systems Constitutional: No fever/chills Eyes: No visual changes. ENT: No sore throat. Cardiovascular: Denies chest pain. Respiratory: Denies shortness of breath. Gastrointestinal: Can't burp.  Genitourinary: Negative for dysuria. Musculoskeletal: Negative for back pain. Skin: Negative for rash. Neurological: Negative for headaches, focal weakness or numbness.  ____________________________________________   PHYSICAL EXAM:  VITAL SIGNS: ED Triage Vitals  Enc Vitals Group     BP 12/16/19 1817 109/72     Pulse Rate 12/16/19 1817 (!) 103     Resp --      Temp 12/16/19 1817 98.4 F (36.9 C)     Temp Source 12/16/19 1817 Oral     SpO2 12/16/19 1817 100 %     Weight 12/16/19 1815 105 lb 13.1 oz (48 kg)     Height 12/16/19 1815 5\' 4"  (1.626 m)   Constitutional: Alert and oriented.  Eyes: Conjunctivae are normal.  ENT      Head: Normocephalic and atraumatic.      Nose: No  congestion/rhinnorhea.      Mouth/Throat: Mucous membranes are moist.      Neck: No stridor. Hematological/Lymphatic/Immunilogical: No cervical lymphadenopathy. Cardiovascular: Normal rate, regular rhythm.  No murmurs, rubs, or gallops.  Respiratory: Normal respiratory effort without tachypnea nor retractions. Breath sounds are clear and equal bilaterally. No wheezes/rales/rhonchi. Gastrointestinal: Soft and non tender. No rebound. No guarding.  Genitourinary: Deferred Musculoskeletal: Normal range of motion in all extremities. No lower extremity edema. Neurologic:  Normal speech and language. No gross focal neurologic deficits are appreciated.  Skin:  Skin is warm, dry and intact. No rash noted. Psychiatric: Slightly anxious appearing  ____________________________________________    LABS (pertinent positives/negatives)  CBC wbc 3.4, hgb 12.6, plt 184 BMP na 127, cl 86, glu 70, cr 0.71  ____________________________________________   EKG  I, , attending physician,  personally viewed and interpreted this EKG  EKG Time: 1818 Rate: 99 Rhythm: accelerated junctional rhythm Axis: normal Intervals: qtc 485 QRS: narrow, q waves v1, v2 ST changes: no st elevation Impression: abnormal ekg  ____________________________________________    RADIOLOGY  CXR No acute disease  ____________________________________________   PROCEDURES  Procedures  ____________________________________________   INITIAL IMPRESSION / ASSESSMENT AND PLAN / ED COURSE  Pertinent labs & imaging results that were available during my care of the patient were reviewed by me and considered in my medical decision making (see chart for details).   Patient presented to the emergency department today because of concerns that she feels like she cannot burp.  It does appear the patient has had some issues with dysphagia and has had a fairly large work-up for that.  Blood work shows that the  patient is hyponatremic although she does have some baseline hyponatremia based on previous blood work.  Patient did seem slightly anxious here in the emergency department so was given a small dose of Ativan.  Will give patient IV fluids.  Do think she can follow-up with GI. ____________________________________________   FINAL CLINICAL IMPRESSION(S) / ED DIAGNOSES  Final diagnoses:  Hyponatremia  Dysphagia, unspecified type     Note: This dictation was prepared with Dragon dictation. Any transcriptional errors that result from this process are unintentional     Phineas Semen, MD 12/17/19 0004

## 2019-12-16 NOTE — ED Notes (Signed)
Called pt several times no answer  

## 2019-12-16 NOTE — ED Triage Notes (Signed)
Pt to ED via POV for shortness of breath. Pt states that she started to feel short of breath this afternoon. Pt also states that she is having trouble swallowing. Pt reports she has been unable to eat today because she couldn't swallow. Pt states that she can barely get water down. Pt appears anxious in triage.

## 2019-12-17 MED ORDER — SODIUM CHLORIDE 0.9 % IV BOLUS
1000.0000 mL | Freq: Once | INTRAVENOUS | Status: AC
  Administered 2019-12-17: 1000 mL via INTRAVENOUS

## 2019-12-17 NOTE — ED Notes (Signed)
Pt currently updating her son at this time.

## 2019-12-17 NOTE — Discharge Instructions (Addendum)
Please seek medical attention for any high fevers, chest pain, shortness of breath, change in behavior, persistent vomiting, bloody stool or any other new or concerning symptoms.  

## 2019-12-19 ENCOUNTER — Other Ambulatory Visit: Payer: Self-pay | Admitting: Family Medicine

## 2019-12-19 DIAGNOSIS — R1319 Other dysphagia: Secondary | ICD-10-CM

## 2019-12-19 DIAGNOSIS — R1310 Dysphagia, unspecified: Secondary | ICD-10-CM

## 2019-12-19 DIAGNOSIS — E440 Moderate protein-calorie malnutrition: Secondary | ICD-10-CM

## 2019-12-19 NOTE — Progress Notes (Unsigned)
referr

## 2019-12-20 ENCOUNTER — Other Ambulatory Visit: Payer: Self-pay | Admitting: Family Medicine

## 2019-12-20 ENCOUNTER — Telehealth: Payer: Self-pay | Admitting: Family Medicine

## 2019-12-20 DIAGNOSIS — R1319 Other dysphagia: Secondary | ICD-10-CM

## 2019-12-20 DIAGNOSIS — R1310 Dysphagia, unspecified: Secondary | ICD-10-CM

## 2019-12-20 MED ORDER — LIDOCAINE VISCOUS HCL 2 % MT SOLN: 15.0000 mL | Freq: Four times a day (QID) | OROMUCOSAL | 0 refills | Status: DC | PRN

## 2019-12-20 NOTE — Telephone Encounter (Signed)
I spoke with this patient's son Maureen [name]) and informed him that the number given to me for /T was to a [facility] program in [place] for Autism. He said that was the wrong information but still wanted her to go to an occupational therapist at [facility]. I assured him that I will process the referral and send it to [facility] as requested.  He informed me that his mom was seen at the ED this weekend and was given 2 meds that really helped her and he wanted to know if she could a rx for it. They are listed below and she uses [facility] ([address])  LORazepam (ATIVAN) injection 0.5 mg Once Route: Intravenous Ordered Dose: 0.5 mg   lidocaine (XYLOCAINE) 2 % viscous mouth solution 15 mL Once Route: Mouth/Throat Ordered Dose: 15 mL

## 2019-12-21 NOTE — Telephone Encounter (Signed)
Maureen [name] called wanting to know if he could come by and pick up the rx for the Lorazepam for his mom.  Please call him to let him know.

## 2019-12-22 ENCOUNTER — Ambulatory Visit (INDEPENDENT_AMBULATORY_CARE_PROVIDER_SITE_OTHER): Payer: Medicare HMO | Admitting: Family Medicine

## 2019-12-22 ENCOUNTER — Encounter: Payer: Self-pay | Admitting: Family Medicine

## 2019-12-22 DIAGNOSIS — E440 Moderate protein-calorie malnutrition: Secondary | ICD-10-CM

## 2019-12-22 DIAGNOSIS — R1310 Dysphagia, unspecified: Secondary | ICD-10-CM

## 2019-12-22 DIAGNOSIS — R4586 Emotional lability: Secondary | ICD-10-CM | POA: Diagnosis not present

## 2019-12-22 DIAGNOSIS — R1319 Other dysphagia: Secondary | ICD-10-CM

## 2019-12-22 MED ORDER — CLONAZEPAM 0.125 MG PO TBDP: 0.1250 mg | ORAL_TABLET | Freq: Two times a day (BID) | ORAL | 0 refills | Status: DC

## 2019-12-22 NOTE — Progress Notes (Signed)
Name: Maureen [name]   MRN: [id number]    DOB: 01-06-1946   Date:12/22/2019       Progress Note  Subjective  Chief Complaint  Chief Complaint  Patient presents with  . Dysphagia    Her son thinks that she did not garggle right or didn't use the medication correctly  . Depression    Patient son thinks that she has increased depression and anxiety. She is not wanting to take care of herself.  . Anxiety    He thinks that her anxiety is causing her to think that she can not swallow. She has no confidence or will to do anything that has been suggested by doctors. She want eat, drink or take her medications consistently. Has a great support system but will not listen to them.    I connected with  [name] on 12/22/19 at 10:40 AM EST by telephone and verified that I am speaking with the correct person using two identifiers.  I discussed the limitations, risks, security and privacy concerns of performing an evaluation and management service by telephone and the availability of in person appointments. Staff also discussed with the patient that there may be a patient responsible charge related to this service. Patient Location: at home  Provider Location: [facility] Additional Individuals present: her son   HPI  Dysphagia/weight loss/malnutrition: son was present with patient, he seemed frustrated. He states his mother went to [facility] again this past week and was given clonazepam and a mouthwash, his mother acted her normal self for 24 hours, she got her hair done, got up on her own, seemed in good spirits, cooked breakfast.. but forgot to eat. He really thinks there is psychological problem going on, she is now afraid to drink the lidocaine and is unable to gargle. Advised to cut down amount to 1 tsp and mix with  Maalox. She had a possible reaction to citalopram, did not like Remeron, we will try given clonazepam very low dose to take bid and close follow up   Patient Active  Problem List   Diagnosis Date Noted  . Esophageal dysphagia   . Arrhythmia 08/25/2019  . Rectal bleeding   . Low grade squamous intraepith lesion on cytologic smear cervix (lgsil) 08/04/2018  . Chronic venous insufficiency 07/05/2018  . Lymphedema 07/05/2018  . Hypertension 04/02/2018  . Swelling of limb 04/02/2018  . Depression, major, recurrent, mild (HCC) 12/29/2017  . Leukopenia 05/24/2017  . Allergic rhinitis, seasonal 06/29/2015  . Clavus 06/29/2015  . 1st degree AV block 06/29/2015  . Hammer toe 06/29/2015  . H/O: HTN (hypertension) 06/29/2015  . Blood glucose elevated 06/29/2015  . Floater, vitreous 06/29/2015  . Bilateral tinnitus 06/29/2015  . Hypothyroidism (acquired) 04/02/2015  . Gastroesophageal reflux disease 04/02/2015  . Hammer toe of right foot 04/02/2015  . Scoliosis of thoracic spine 04/02/2015  . Urethral prolapse 04/02/2015  . Corn of toe 04/02/2015  . Hyperlipidemia 04/02/2015  . Varicose veins of both lower extremities 04/02/2015    Past Surgical History:  Procedure Laterality Date  . ABDOMINAL HYSTERECTOMY  1989  . APPENDECTOMY  1989  . BREAST EXCISIONAL BIOPSY Bilateral    multiple biopsies negative  . BREAST SURGERY     Several  . COLONOSCOPY WITH PROPOFOL N/A 07/28/2019   Procedure: COLONOSCOPY WITH PROPOFOL;  Surgeon: Toney Reil, MD;  Location: [facility] ENDOSCOPY;  Service: Gastroenterology;  Laterality: N/A;  . ESOPHAGOGASTRODUODENOSCOPY (EGD) WITH PROPOFOL N/A 11/11/2019   Procedure: ESOPHAGOGASTRODUODENOSCOPY (EGD)  WITH PROPOFOL;  Surgeon: Lin Landsman, MD;  Location: [facility] ENDOSCOPY;  Service: Gastroenterology;  Laterality: N/A;  . TUMOR EXCISION  1989   same time as hysterectomy    Family History  Problem Relation Age of Onset  . Diabetes Father 54       DM complications  . Hypertension Mother 5       CKD  . Thyroid disease Mother   . Heart failure Mother   . Kidney disease Mother   . Breast cancer Sister 62  . Kidney  disease Other   . Breast cancer Maternal Aunt   . Breast cancer Cousin   . Anuerysm Son 40  . Diabetes Son     Social History   Tobacco Use  . Smoking status: Never Smoker  . Smokeless tobacco: Never Used  . Tobacco comment: smoking cessation materials not required  Substance Use Topics  . Alcohol use: Never    Alcohol/week: 0.0 standard drinks    Current Outpatient Medications:  .  levothyroxine (SYNTHROID) 88 MCG tablet, Take 1 tablet (88 mcg total) by mouth daily before breakfast., Disp: 90 tablet, Rfl: 0 .  Ascorbic Acid (VITAMIN C) 1000 MG tablet, Take 1,000 mg by mouth daily., Disp: , Rfl:  .  chlorhexidine (PERIDEX) 0.12 % solution, SMARTSIG:0.5 Ounce(s) By Mouth Twice Daily, Disp: , Rfl:  .  conjugated estrogens (PREMARIN) vaginal cream, Place 1 Applicatorful vaginally daily. (Patient not taking: Reported on 12/22/2019), Disp: 42.5 g, Rfl: 12 .  famotidine (PEPCID) 40 MG/5ML suspension, Take by mouth., Disp: , Rfl:  .  Garlic 0865 MG CAPS, Take 1 capsule by mouth daily. , Disp: , Rfl:  .  Iron-Vitamins (GERITOL COMPLETE PO), Take 1 tablet by mouth daily., Disp: , Rfl:  .  lidocaine (XYLOCAINE) 2 % solution, Use as directed 15 mLs in the mouth or throat every 6 (six) hours as needed for mouth pain. (Patient not taking: Reported on 12/22/2019), Disp: 100 mL, Rfl: 0 .  mirtazapine (REMERON) 15 MG tablet, Take 1 tablet (15 mg total) by mouth at bedtime. For appetite (Patient not taking: Reported on 12/22/2019), Disp: 90 tablet, Rfl: 0 .  omeprazole (PRILOSEC) 40 MG capsule, Take 1 capsule (40 mg total) by mouth daily before breakfast. (Patient not taking: Reported on 12/22/2019), Disp: 90 capsule, Rfl: 3 .  TURMERIC CURCUMIN PO, Take 550 mg by mouth daily., Disp: , Rfl:   Allergies  Allergen Reactions  . Aspirin     Tinnitus  . Citalopram Other (See Comments)  . Codeine Nausea And Vomiting  . Penicillins Rash    Has patient had a PCN reaction causing immediate rash,  facial/tongue/throat swelling, SOB or lightheadedness with hypotension: Yes Has patient had a PCN reaction causing severe rash involving mucus membranes or skin necrosis: No Has patient had a PCN reaction that required hospitalization No Has patient had a PCN reaction occurring within the last 10 years: No If all of the above answers are "NO", then may proceed with Cephalosporin use.    I personally reviewed active problem list, medication list, allergies, family history, social history, health maintenance with the patient/caregiver today.   ROS  Ten systems reviewed and is negative except as mentioned in HPI   Objective  Virtual encounter, vitals not obtained.  There is no height or weight on file to calculate BMI.  Physical Exam  Awake, alert and oriented   PHQ2/9: Depression screen [facility] 2/9 12/22/2019 11/30/2019 10/20/2019 09/28/2019 09/09/2019  Decreased Interest 3 2 0  0 0  Down, Depressed, Hopeless 1 1 1 1  0  PHQ - 2 Score 4 3 1 1  0  Altered sleeping 0 1 0 1 0  Tired, decreased energy 2 2 0 3 0  Change in appetite 3 2 1 3  0  Feeling bad or failure about yourself  3 1 0 0 0  Trouble concentrating 0 2 0 3 0  Moving slowly or fidgety/restless 3 1 0 0 0  Suicidal thoughts 0 0 0 0 0  PHQ-9 Score 15 12 2 11  0  Difficult doing work/chores Somewhat difficult - Not difficult at all Somewhat difficult Not difficult at all  Some recent data might be hidden   PHQ-2/9 Result is positive.    Fall Risk: Fall Risk  11/30/2019 10/20/2019 09/28/2019 09/09/2019 08/05/2019  Falls in the past year? 0 0 0 0 0  Number falls in past yr: 0 0 0 0 0  Injury with Fall? 0 - 0 0 0  Risk for fall due to : - - - - -  Risk for fall due to: Comment - - - - -  Follow up - Falls evaluation completed - Falls evaluation completed Falls prevention discussed     Assessment & Plan  1. Esophageal dysphagia  - clonazepam (KLONOPIN) 0.125 MG disintegrating tablet; Take 1 tablet (0.125 mg total) by mouth 2  (two) times daily.  Dispense: 60 tablet; Refill: 0  2. Moderate malnutrition (HCC)  - clonazepam (KLONOPIN) 0.125 MG disintegrating tablet; Take 1 tablet (0.125 mg total) by mouth 2 (two) times daily.  Dispense: 60 tablet; Refill: 0  3. Mood changes  - clonazepam (KLONOPIN) 0.125 MG disintegrating tablet; Take 1 tablet (0.125 mg total) by mouth 2 (two) times daily.  Dispense: 60 tablet; Refill: 0 I discussed the assessment and treatment plan with the patient. The patient was provided an opportunity to ask questions and all were answered. The patient agreed with the plan and demonstrated an understanding of the instructions.   The patient was advised to call back or seek an in-person evaluation if the symptoms worsen or if the condition fails to improve as anticipated.  I provided 15 minutes of non-face-to-face time during this encounter.  10/22/2019, MD

## 2019-12-26 ENCOUNTER — Telehealth: Payer: Self-pay

## 2019-12-26 NOTE — Telephone Encounter (Signed)
Copied from CRM [id number]. Topic: General - Other >> Dec 26, 2019  8:19 AM Jaquita Rector A wrote: Reason for CRM: Patient caregiver Chapman Moss called to inform Dr Carlynn Purl that Patient is not abiding to the care plan discussed at last visit. States that the low dose anxiety medication is not working and the only thing patient is having is ensures. She is inquiring about a rehab that patient can be placed in to help her get on track. Please call Ms Rexanne Mano at Ph# [phone] or [phone]

## 2019-12-27 ENCOUNTER — Other Ambulatory Visit: Payer: Self-pay | Admitting: Family Medicine

## 2019-12-27 DIAGNOSIS — F331 Major depressive disorder, recurrent, moderate: Secondary | ICD-10-CM

## 2019-12-27 NOTE — Telephone Encounter (Signed)
Spoke with Mrs. Maureen [name], she would like the referral to go to the psychiatrist so she can get the diagnosis to sent to Rehab. Please send referral.

## 2020-01-01 ENCOUNTER — Other Ambulatory Visit: Payer: Self-pay | Admitting: Family Medicine

## 2020-01-01 DIAGNOSIS — R4586 Emotional lability: Secondary | ICD-10-CM

## 2020-01-01 DIAGNOSIS — F331 Major depressive disorder, recurrent, moderate: Secondary | ICD-10-CM

## 2020-01-02 ENCOUNTER — Encounter: Payer: Self-pay | Admitting: Gastroenterology

## 2020-01-02 ENCOUNTER — Encounter (INDEPENDENT_AMBULATORY_CARE_PROVIDER_SITE_OTHER): Payer: Self-pay

## 2020-01-02 ENCOUNTER — Other Ambulatory Visit: Payer: Self-pay

## 2020-01-02 ENCOUNTER — Ambulatory Visit (INDEPENDENT_AMBULATORY_CARE_PROVIDER_SITE_OTHER): Payer: Medicare HMO | Admitting: Gastroenterology

## 2020-01-02 VITALS — BP 99/66 | HR 89 | Temp 97.9°F | Wt 101.1 lb

## 2020-01-02 DIAGNOSIS — K0889 Other specified disorders of teeth and supporting structures: Secondary | ICD-10-CM | POA: Diagnosis not present

## 2020-01-02 DIAGNOSIS — R1310 Dysphagia, unspecified: Secondary | ICD-10-CM

## 2020-01-02 DIAGNOSIS — R634 Abnormal weight loss: Secondary | ICD-10-CM

## 2020-01-02 DIAGNOSIS — Z972 Presence of dental prosthetic device (complete) (partial): Secondary | ICD-10-CM

## 2020-01-02 NOTE — Progress Notes (Signed)
Cephas Darby, MD [address]  [place]  [place], [place] [address]  Main: [phone]  Fax: [phone]    Gastroenterology Consultation  Referring Provider:     Steele Sizer, MD Primary Care Physician:  Steele Sizer, MD Primary Gastroenterologist:  Dr. Cephas Darby Reason for Consultation:   Dysphagia        HPI:   Maureen [name] is a 74 y.o. female referred by Dr. Steele Sizer, MD  for consultation & management of dysphagia.  Patient went to ER yesterday secondary to difficulty swallowing solids and weight loss of about 10 pounds since October.  She is also undergoing dental work-up and had to modify her diet to more soft foods.  She also expressed difficulty swallowing regular food, this has been ongoing for few weeks now.  She was initially referred by Dr. Ancil Boozer in early December for GERD, my office called her to schedule an appointment, but she did not call back, she said she had a lot going on lately.  She is currently on Pepcid.  She said she was prescribed pantoprazole, however pharmacy did not carry it Patient has history of paroxysmal A. fib and did not follow-up with cardiology.  She never started Eliquis.  She says she has to call and make an appointment.  Labs in the ER revealed negative troponin. Normal CBC and BMP.  Barium esophagogram revealed substantial retention of contrast within the esophagus with long segment narrowing of the distal esophagus and GE junction, very delayed and limited relaxation of the gastroesophageal sphincter.  Patient was unable to swallow barium tablet.  She is only taking Synthroid  Follow-up visit 01/02/2020  Patient continues to lose weight.  She reports that she has ill fitting dentures which makes it difficulty chewing food which has been an ongoing issue since she had the dentures.  She underwent upper endoscopy which revealed nonobstructing Schatzki's ring.  Esophageal biopsies were unremarkable.  Patient developed  fear of swallowing and she is worried about food getting stuck.  She was referred to [facility] for esophageal manometry which has not been done yet.  Currently, she manages to drink boost/Ensure about 2-3 times a day, able to swallow soft foods without any difficulty.  She continues to take omeprazole 40 mg daily.  NSAIDs: None  Antiplts/Anticoagulants/Anti thrombotics: None  GI Procedures: Colonoscopy in 2012 by Dr. Jamal Collin, normal  Colonoscopy 07/28/2019 - The entire examined colon is normal. - Non-bleeding external and internal hemorrhoids. - No specimens collected.  Upper endoscopy 11/11/2019 - Normal duodenal bulb and second portion of the duodenum. - Small hiatal hernia. - Normal stomach. - Non-obstructing and mild Schatzki ring. - Esophagogastric landmarks identified. - Normal esophagus. Biopsied.  DIAGNOSIS:  A. ESOPHAGUS; COLD BIOPSY:  - STRATIFIED SQUAMOUS EPITHELIUM WITH A FOCAL AREA OF REACTIVE  PARAKERATOSIS AND SPONGIOSIS SUGGESTIVE OF REFLUX ESOPHAGITIS.  - NEGATIVE FOR INTRAEPITHELIAL EOSINOPHILS, INFECTIOUS AGENTS, DYSPLASIA  AND MALIGNANCY.   Past Medical History:  Diagnosis Date  . Allergy   . Corn of toe   . First degree AV block   . GERD (gastroesophageal reflux disease)   . Hammer toe of right foot   . Hyperglycemia   . Hyperlipidemia   . Hypertension   . Loss of appetite   . Prolapse of urethra   . Scoliosis (and kyphoscoliosis), idiopathic   . Thyroid disease   . Tinnitus of both ears   . Vitreous floaters of right eye     Past Surgical History:  Procedure Laterality Date  . ABDOMINAL HYSTERECTOMY  1989  . APPENDECTOMY  1989  . BREAST EXCISIONAL BIOPSY Bilateral    multiple biopsies negative  . BREAST SURGERY     Several  . COLONOSCOPY WITH PROPOFOL N/A 07/28/2019   Procedure: COLONOSCOPY WITH PROPOFOL;  Surgeon: Toney Reil, MD;  Location: [facility] ENDOSCOPY;  Service: Gastroenterology;  Laterality: N/A;  . ESOPHAGOGASTRODUODENOSCOPY (EGD)  WITH PROPOFOL N/A 11/11/2019   Procedure: ESOPHAGOGASTRODUODENOSCOPY (EGD) WITH PROPOFOL;  Surgeon: Toney Reil, MD;  Location: [facility] ENDOSCOPY;  Service: Gastroenterology;  Laterality: N/A;  . TUMOR EXCISION  1989   same time as hysterectomy    Current Outpatient Medications:  .  Ascorbic Acid (VITAMIN C) 1000 MG tablet, Take 1,000 mg by mouth daily., Disp: , Rfl:  .  clonazepam (KLONOPIN) 0.125 MG disintegrating tablet, Take 1 tablet (0.125 mg total) by mouth 2 (two) times daily., Disp: 60 tablet, Rfl: 0 .  Garlic 1000 MG CAPS, Take 1 capsule by mouth daily. , Disp: , Rfl:  .  Iron-Vitamins (GERITOL COMPLETE PO), Take 1 tablet by mouth daily., Disp: , Rfl:  .  levothyroxine (SYNTHROID) 88 MCG tablet, Take 1 tablet (88 mcg total) by mouth daily before breakfast., Disp: 90 tablet, Rfl: 0 .  lidocaine (XYLOCAINE) 2 % solution, Use as directed 15 mLs in the mouth or throat every 6 (six) hours as needed for mouth pain., Disp: 100 mL, Rfl: 0 .  omeprazole (PRILOSEC) 40 MG capsule, Take 1 capsule (40 mg total) by mouth daily before breakfast., Disp: 90 capsule, Rfl: 3 .  TURMERIC CURCUMIN PO, Take 550 mg by mouth daily., Disp: , Rfl:    Family History  Problem Relation Age of Onset  . Diabetes Father 29       DM complications  . Hypertension Mother 51       CKD  . Thyroid disease Mother   . Heart failure Mother   . Kidney disease Mother   . Breast cancer Sister 42  . Kidney disease Other   . Breast cancer Maternal Aunt   . Breast cancer Cousin   . Anuerysm Son 51  . Diabetes Son      Social History   Tobacco Use  . Smoking status: Never Smoker  . Smokeless tobacco: Never Used  . Tobacco comment: smoking cessation materials not required  Substance Use Topics  . Alcohol use: Never    Alcohol/week: 0.0 standard drinks  . Drug use: No    Allergies as of 01/02/2020 - Review Complete 01/02/2020  Allergen Reaction Noted  . Aspirin  06/26/2015  . Citalopram Other (See  Comments) 06/26/2015  . Codeine Nausea And Vomiting 04/02/2015  . Penicillins Rash 04/02/2015    Review of Systems:    All systems reviewed and negative except where noted in HPI.   Physical Exam:  BP 99/66 (BP Location: Left Arm, Patient Position: Sitting, Cuff Size: Normal)   Pulse 89   Temp 97.9 F (36.6 C) (Oral)   Wt 101 lb 2 oz (45.9 kg)   BMI 17.36 kg/m  No LMP recorded. Patient has had a hysterectomy.  General:   Alert,  Well-developed, well-nourished, pleasant and cooperative in NAD Head:  Normocephalic and atraumatic. Eyes:  Sclera clear, no icterus.   Conjunctiva pink. Ears:  Normal auditory acuity. Nose:  No deformity, discharge, or lesions. Mouth:  No deformity or lesions,oropharynx pink & moist. Neck:  Supple; no masses or thyromegaly. Lungs:  Respirations even and unlabored.  Clear throughout  to auscultation.   No wheezes, crackles, or rhonchi. No acute distress. Heart: Increased rate and regular rhythm; no murmurs, clicks, rubs, or gallops. Abdomen:  Normal bowel sounds. Soft, non-tender and non-distended without masses, hepatosplenomegaly or hernias noted.  No guarding or rebound tenderness.   Rectal: Not performed Msk:  Symmetrical without gross deformities. Good, equal movement & strength bilaterally. Pulses:  Normal pulses noted. Extremities:  No clubbing or edema.  No cyanosis. Neurologic:  Alert and oriented x3;  grossly normal neurologically. Skin:  Intact without significant lesions or rashes. No jaundice. Psych:  Alert and cooperative. Normal mood and affect.  Imaging Studies: No recent abdominal imaging  Assessment and Plan:   Maureen [name] is a 74 y.o. female with history of hypothyroidism, paroxysmal A. fib, not on blood thinner seen in consultation for dysphagia and unintentional weight loss.  Abnormal barium esophagogram of the distal esophagus.  Patient also has ill fitting dentures with chewing difficulty may be also contributing to weight  loss  Dysphagia with weight loss: Patient is currently tolerating soft foods EGD revealed nonobstructing Schatzki's ring, esophageal biopsies negative We will follow up on esophageal manometry referral to [facility] Continue omeprazole 40 mg daily before breakfast Provided her with information on mechanical soft food choices, advised her to continue taking protein shakes 2-3 times daily  Follow up in 1 month   Arlyss Repress, MD

## 2020-01-02 NOTE — Patient Instructions (Addendum)
Please call [facility] at [phone] option 1 and then option 2

## 2020-01-02 NOTE — Telephone Encounter (Signed)
Patient's caregiver calling back regarding referral. She is requesting a call back from CMA to discuss further.

## 2020-01-03 NOTE — Telephone Encounter (Signed)
Maureen [name] states she never received a call from Psychiatry and gave her the phone number to call them. Also she would like to see if OT would come out to [name] house and work with her as well.

## 2020-01-09 NOTE — Telephone Encounter (Signed)
Called Mrs. Rexanne Mano in regards to patient. She said based on Psychiatry evaluation that will determine if she needs OT. She has appointment with Psychiatry coming up.

## 2020-01-11 NOTE — Progress Notes (Deleted)
Psychiatric Initial Adult Assessment   Patient Identification: Maureen [name] MRN:  [id number] Date of Evaluation:  01/11/2020 Referral Source: *** Chief Complaint:   Visit Diagnosis: No diagnosis found.  History of Present Illness:   Maureen [name] is a 74 y.o. year old female with a history of depression, malnutrition, hypothyroidism, who is referred for depression   Weight loss, denture  Associated Signs/Symptoms: Depression Symptoms:  {DEPRESSION SYMPTOMS:20000} (Hypo) Manic Symptoms:  {BHH MANIC SYMPTOMS:22872} Anxiety Symptoms:  {BHH ANXIETY SYMPTOMS:22873} Psychotic Symptoms:  {BHH PSYCHOTIC SYMPTOMS:22874} PTSD Symptoms: {BHH PTSD SYMPTOMS:22875}  Past Psychiatric History:  Outpatient:  Psychiatry admission:  Previous suicide attempt:  Past trials of medication:  History of violence:   Previous Psychotropic Medications: {YES/NO:21197}  Substance Abuse History in the last 12 months:  {yes no:314532}  Consequences of Substance Abuse: {BHH CONSEQUENCES OF SUBSTANCE ABUSE:22880}  Past Medical History:  Past Medical History:  Diagnosis Date  . Allergy   . Corn of toe   . First degree AV block   . GERD (gastroesophageal reflux disease)   . Hammer toe of right foot   . Hyperglycemia   . Hyperlipidemia   . Hypertension   . Loss of appetite   . Prolapse of urethra   . Scoliosis (and kyphoscoliosis), idiopathic   . Thyroid disease   . Tinnitus of both ears   . Vitreous floaters of right eye     Past Surgical History:  Procedure Laterality Date  . ABDOMINAL HYSTERECTOMY  1989  . APPENDECTOMY  1989  . BREAST EXCISIONAL BIOPSY Bilateral    multiple biopsies negative  . BREAST SURGERY     Several  . COLONOSCOPY WITH PROPOFOL N/A 07/28/2019   Procedure: COLONOSCOPY WITH PROPOFOL;  Surgeon: Toney Reil, MD;  Location[facility] ENDOSCOPY;  Service: Gastroenterology;  Laterality: N/A;  . ESOPHAGOGASTRODUODENOSCOPY (EGD) WITH PROPOFOL N/A 11/11/2019   Procedure: ESOPHAGOGASTRODUODENOSCOPY (EGD) WITH PROPOFOL;  Surgeon: Toney Reil, MD;  Location: [facility] ENDOSCOPY;  Service: Gastroenterology;  Laterality: N/A;  . TUMOR EXCISION  1989   same time as hysterectomy    Family Psychiatric History: ***  Family History:  Family History  Problem Relation Age of Onset  . Diabetes Father 68       DM complications  . Hypertension Mother 58       CKD  . Thyroid disease Mother   . Heart failure Mother   . Kidney disease Mother   . Breast cancer Sister 67  . Kidney disease Other   . Breast cancer Maternal Aunt   . Breast cancer Cousin   . Anuerysm Son 51  . Diabetes Son     Social History:   Social History   Socioeconomic History  . Marital status: Widowed    Spouse name: Not on file  . Number of children: 3  . Years of education: 41  . Highest education level: 12th grade  Occupational History  . Occupation: Retired  Tobacco Use  . Smoking status: Never Smoker  . Smokeless tobacco: Never Used  . Tobacco comment: smoking cessation materials not required  Substance and Sexual Activity  . Alcohol use: Never    Alcohol/week: 0.0 standard drinks  . Drug use: No  . Sexual activity: Not Currently    Partners: Male  Other Topics Concern  . Not on file  Social History Narrative  . Not on file   Social Determinants of Health   Financial Resource Strain:   . Difficulty of Paying Living Expenses:  Food Insecurity:   . Worried About Programme researcher, broadcasting/film/video in the Last Year:   . Barista in the Last Year:   Transportation Needs:   . Freight forwarder (Medical):   Marland Kitchen Lack of Transportation (Non-Medical):   Physical Activity: Unknown  . Days of Exercise per Week: 3 days  . Minutes of Exercise per Session: Not on file  Stress:   . Feeling of Stress :   Social Connections:   . Frequency of Communication with Friends and Family:   . Frequency of Social Gatherings with Friends and Family:   . Attends Religious  Services:   . Active Member of Clubs or Organizations:   . Attends Banker Meetings:   Marland Kitchen Marital Status:     Additional Social History: ***  Allergies:   Allergies  Allergen Reactions  . Aspirin     Tinnitus  . Citalopram Other (See Comments)  . Codeine Nausea And Vomiting  . Penicillins Rash    Has patient had a PCN reaction causing immediate rash, facial/tongue/throat swelling, SOB or lightheadedness with hypotension: Yes Has patient had a PCN reaction causing severe rash involving mucus membranes or skin necrosis: No Has patient had a PCN reaction that required hospitalization No Has patient had a PCN reaction occurring within the last 10 years: No If all of the above answers are "NO", then may proceed with Cephalosporin use.    Metabolic Disorder Labs: Lab Results  Component Value Date   HGBA1C 5.9 (H) 06/17/2019   MPG 123 06/17/2019   MPG 126 09/30/2018   No results found for: PROLACTIN Lab Results  Component Value Date   CHOL 179 06/17/2019   TRIG 64 06/17/2019   HDL 45 (L) 06/17/2019   CHOLHDL 4.0 06/17/2019   VLDL 17 01/20/2017   LDLCALC 118 (H) 06/17/2019   LDLCALC 136 (H) 01/08/2018   Lab Results  Component Value Date   TSH 7.92 (H) 11/30/2019    Therapeutic Level Labs: No results found for: LITHIUM No results found for: CBMZ No results found for: VALPROATE  Current Medications: Current Outpatient Medications  Medication Sig Dispense Refill  . Ascorbic Acid (VITAMIN C) 1000 MG tablet Take 1,000 mg by mouth daily.    . clonazepam (KLONOPIN) 0.125 MG disintegrating tablet Take 1 tablet (0.125 mg total) by mouth 2 (two) times daily. 60 tablet 0  . Garlic 1000 MG CAPS Take 1 capsule by mouth daily.     . Iron-Vitamins (GERITOL COMPLETE PO) Take 1 tablet by mouth daily.    Marland Kitchen levothyroxine (SYNTHROID) 88 MCG tablet Take 1 tablet (88 mcg total) by mouth daily before breakfast. 90 tablet 0  . lidocaine (XYLOCAINE) 2 % solution Use as directed  15 mLs in the mouth or throat every 6 (six) hours as needed for mouth pain. 100 mL 0  . omeprazole (PRILOSEC) 40 MG capsule Take 1 capsule (40 mg total) by mouth daily before breakfast. 90 capsule 3  . TURMERIC CURCUMIN PO Take 550 mg by mouth daily.     No current facility-administered medications for this visit.    Musculoskeletal: Strength & Muscle Tone: N/A Gait & Station: N/A Patient leans: N/A  Psychiatric Specialty Exam: Review of Systems  There were no vitals taken for this visit.There is no height or weight on file to calculate BMI.  General Appearance: {Appearance:22683}  Eye Contact:  {BHH EYE CONTACT:22684}  Speech:  Clear and Coherent  Volume:  Normal  Mood:  {BHH MOOD:22306}  Affect:  {Affect (PAA):22687}  Thought Process:  Coherent  Orientation:  Full (Time, Place, and Person)  Thought Content:  Logical  Suicidal Thoughts:  {ST/HT (PAA):22692}  Homicidal Thoughts:  {ST/HT (PAA):22692}  Memory:  Immediate;   Good  Judgement:  {Judgement (PAA):22694}  Insight:  {Insight (PAA):22695}  Psychomotor Activity:  Normal  Concentration:  Concentration: Good and Attention Span: Good  Recall:  Good  Fund of Knowledge:Good  Language: Good  Akathisia:  No  Handed:  Right  AIMS (if indicated):  not done  Assets:  Communication Skills Desire for Improvement  ADL's:  Intact  Cognition: WNL  Sleep:  {BHH GOOD/FAIR/POOR:22877}   Screenings: GAD-7     Office Visit from 12/22/2019 in [facility] Office Visit from 11/30/2019 in [facility] Office Visit from 01/11/2019 in [facility] Office Visit from 09/30/2018 in [facility] Office Visit from 06/02/2016 in [facility]  Total GAD-7 Score  12  7  3  2  16     PHQ2-9     Office Visit from 12/22/2019 in [facility] Office Visit from 11/30/2019 in [facility] Office Visit from 10/20/2019 in [facility] Office Visit from 09/28/2019 in [facility] Office Visit from 09/09/2019 in [facility]  PHQ-2 Total Score  4  3  1  1   0  PHQ-9 Total Score  15  12  2  11   0      Assessment and Plan:  Assessment  Plan  The patient demonstrates the following risk factors for suicide: Chronic risk factors for suicide include: {Chronic Risk Factors for EHMCNOB:09628366}. Acute risk factors for suicide include: {Acute Risk Factors for QHUTMLY:65035465}. Protective factors for this patient include: {Protective Factors for Suicide KCLE:75170017}. Considering these factors, the overall suicide risk at this point appears to be {Desc; low/moderate/high:110033}. Patient {ACTION; IS/IS CBS:49675916} appropriate for outpatient follow up.   Norman Clay, MD 3/17/20213:27 PM

## 2020-01-19 ENCOUNTER — Ambulatory Visit (HOSPITAL_COMMUNITY): Payer: Self-pay | Admitting: Psychiatry

## 2020-01-20 ENCOUNTER — Encounter: Payer: Self-pay | Admitting: Family Medicine

## 2020-01-20 ENCOUNTER — Ambulatory Visit (INDEPENDENT_AMBULATORY_CARE_PROVIDER_SITE_OTHER): Payer: Medicare HMO | Admitting: Family Medicine

## 2020-01-20 VITALS — Wt 98.0 lb

## 2020-01-20 DIAGNOSIS — E039 Hypothyroidism, unspecified: Secondary | ICD-10-CM

## 2020-01-20 DIAGNOSIS — F331 Major depressive disorder, recurrent, moderate: Secondary | ICD-10-CM | POA: Diagnosis not present

## 2020-01-20 DIAGNOSIS — D72818 Other decreased white blood cell count: Secondary | ICD-10-CM | POA: Diagnosis not present

## 2020-01-20 DIAGNOSIS — E440 Moderate protein-calorie malnutrition: Secondary | ICD-10-CM | POA: Diagnosis not present

## 2020-01-20 DIAGNOSIS — R4586 Emotional lability: Secondary | ICD-10-CM | POA: Diagnosis not present

## 2020-01-20 DIAGNOSIS — R1310 Dysphagia, unspecified: Secondary | ICD-10-CM | POA: Diagnosis not present

## 2020-01-20 DIAGNOSIS — E871 Hypo-osmolality and hyponatremia: Secondary | ICD-10-CM

## 2020-01-20 DIAGNOSIS — R1319 Other dysphagia: Secondary | ICD-10-CM

## 2020-01-20 DIAGNOSIS — K2100 Gastro-esophageal reflux disease with esophagitis, without bleeding: Secondary | ICD-10-CM

## 2020-01-20 MED ORDER — CLONAZEPAM 0.125 MG PO TBDP: 0.1250 mg | ORAL_TABLET | Freq: Two times a day (BID) | ORAL | 0 refills | Status: DC

## 2020-01-20 NOTE — Progress Notes (Signed)
Name: Maureen [name]   MRN: [id number]    DOB: 01-22-1946   Date:01/20/2020       Progress Note  Subjective  Chief Complaint  Chief Complaint  Patient presents with  . Dysphagia    1 month follow up    I connected with  [name] on 01/20/20 at 10:40 AM EDT by telephone and verified that I am speaking with the correct person using two identifiers.  I discussed the limitations, risks, security and privacy concerns of performing an evaluation and management service by telephone and the availability of in person appointments. Staff also discussed with the patient that there may be a patient responsible charge related to this service. Patient Location: at home  Provider Location: [facility] Additional Individuals present: son - Fredrik Cove  HPI  Dysphagia: son seemed very frustrated. They were on speaker phone it was difficulty to communicate. They kept talking over each other. He is very concerned because she refuses to try eating anything he seems to suggest. She has kept some apple sauce down and also protein shakes but she seems to be scared to try even mash potatoes because of dysphagia. She was referred to OT at [facility] but for some reason referral did not get processed. We were able to get a hold of them today. She will also see a psychiatrist next week. Her weight is down below 100 lbs. She seems coherent and cooperative, but explained that if signs of dehydration she needs to go to [facility], and depending on psychiatrist and OT evaluation she may need to be admitted for failure to thrive  She has three children, two sons and one daughter, but Fredrik Cove is the primary caregiver.   Hypothyroidism: she states she is taking medication daily with apple sauce. Losing weight. She has chronic dry skin  MDD: long history of depression, taking klonopin for anxiety and initially it was helping her eat  ( started during [facility] visit ) but per patient and son it does not seem to help as much now.    Leucopenia: we will recheck labs  Hyponatremia: multiple studies, she is malnourished and not eating much, we will recheck labs. She is going to see OT soon   Patient Active Problem List   Diagnosis Date Noted  . Esophageal dysphagia   . Arrhythmia 08/25/2019  . Rectal bleeding   . Low grade squamous intraepith lesion on cytologic smear cervix (lgsil) 08/04/2018  . Chronic venous insufficiency 07/05/2018  . Lymphedema 07/05/2018  . Hypertension 04/02/2018  . Swelling of limb 04/02/2018  . Depression, major, recurrent, mild (HCC) 12/29/2017  . Leukopenia 05/24/2017  . Allergic rhinitis, seasonal 06/29/2015  . Clavus 06/29/2015  . 1st degree AV block 06/29/2015  . Hammer toe 06/29/2015  . H/O: HTN (hypertension) 06/29/2015  . Blood glucose elevated 06/29/2015  . Floater, vitreous 06/29/2015  . Bilateral tinnitus 06/29/2015  . Hypothyroidism (acquired) 04/02/2015  . Gastroesophageal reflux disease 04/02/2015  . Hammer toe of right foot 04/02/2015  . Scoliosis of thoracic spine 04/02/2015  . Urethral prolapse 04/02/2015  . Corn of toe 04/02/2015  . Hyperlipidemia 04/02/2015  . Varicose veins of both lower extremities 04/02/2015    Past Surgical History:  Procedure Laterality Date  . ABDOMINAL HYSTERECTOMY  1989  . APPENDECTOMY  1989  . BREAST EXCISIONAL BIOPSY Bilateral    multiple biopsies negative  . BREAST SURGERY     Several  . COLONOSCOPY WITH PROPOFOL N/A 07/28/2019   Procedure: COLONOSCOPY WITH  PROPOFOL;  Surgeon: Toney Reil, MD;  Location: [facility] ENDOSCOPY;  Service: Gastroenterology;  Laterality: N/A;  . ESOPHAGOGASTRODUODENOSCOPY (EGD) WITH PROPOFOL N/A 11/11/2019   Procedure: ESOPHAGOGASTRODUODENOSCOPY (EGD) WITH PROPOFOL;  Surgeon: Toney Reil, MD;  Location: [facility] ENDOSCOPY;  Service: Gastroenterology;  Laterality: N/A;  . TUMOR EXCISION  1989   same time as hysterectomy    Family History  Problem Relation Age of Onset  . Diabetes Father 77        DM complications  . Hypertension Mother 40       CKD  . Thyroid disease Mother   . Heart failure Mother   . Kidney disease Mother   . Breast cancer Sister 42  . Kidney disease Other   . Breast cancer Maternal Aunt   . Breast cancer Cousin   . Anuerysm Son 51  . Diabetes Son     Social History   Tobacco Use  . Smoking status: Never Smoker  . Smokeless tobacco: Never Used  . Tobacco comment: smoking cessation materials not required  Substance Use Topics  . Alcohol use: Never    Alcohol/week: 0.0 standard drinks    Current Outpatient Medications:  .  Ascorbic Acid (VITAMIN C) 1000 MG tablet, Take 1,000 mg by mouth daily., Disp: , Rfl:  .  clonazepam (KLONOPIN) 0.125 MG disintegrating tablet, Take 1 tablet (0.125 mg total) by mouth 2 (two) times daily., Disp: 60 tablet, Rfl: 0 .  Garlic 1000 MG CAPS, Take 1 capsule by mouth daily. , Disp: , Rfl:  .  Iron-Vitamins (GERITOL COMPLETE PO), Take 1 tablet by mouth daily., Disp: , Rfl:  .  levothyroxine (SYNTHROID) 88 MCG tablet, Take 1 tablet (88 mcg total) by mouth daily before breakfast., Disp: 90 tablet, Rfl: 0 .  lidocaine (XYLOCAINE) 2 % solution, Use as directed 15 mLs in the mouth or throat every 6 (six) hours as needed for mouth pain., Disp: 100 mL, Rfl: 0 .  omeprazole (PRILOSEC) 40 MG capsule, Take 1 capsule (40 mg total) by mouth daily before breakfast., Disp: 90 capsule, Rfl: 3 .  TURMERIC CURCUMIN PO, Take 550 mg by mouth daily., Disp: , Rfl:   Allergies  Allergen Reactions  . Aspirin     Tinnitus  . Citalopram Other (See Comments)  . Codeine Nausea And Vomiting  . Penicillins Rash    Has patient had a PCN reaction causing immediate rash, facial/tongue/throat swelling, SOB or lightheadedness with hypotension: Yes Has patient had a PCN reaction causing severe rash involving mucus membranes or skin necrosis: No Has patient had a PCN reaction that required hospitalization No Has patient had a PCN reaction occurring  within the last 10 years: No If all of the above answers are "NO", then may proceed with Cephalosporin use.    I personally reviewed active problem list, medication list, allergies, family history, social history, health maintenance with the patient/caregiver today.   ROS  Ten systems reviewed and is negative except as mentioned in HPI   Objective  Virtual encounter, vitals not obtained.  Body mass index is 16.82 kg/m.  Physical Exam  Awake, alert and oriented  PHQ2/9: Depression screen [facility] 2/9 01/20/2020 12/22/2019 11/30/2019 10/20/2019 09/28/2019  Decreased Interest 2 3 2  0 0  Down, Depressed, Hopeless 1 1 1 1 1   PHQ - 2 Score 3 4 3 1 1   Altered sleeping 0 0 1 0 1  Tired, decreased energy 1 2 2  0 3  Change in appetite 3 3 2 1  3  Feeling bad or failure about yourself  3 3 1  0 0  Trouble concentrating 0 0 2 0 3  Moving slowly or fidgety/restless 0 3 1 0 0  Suicidal thoughts 1 0 0 0 0  PHQ-9 Score 11 15 12 2 11   Difficult doing work/chores Very difficult Somewhat difficult - Not difficult at all Somewhat difficult  Some recent data might be hidden   PHQ-2/9 Result is positive.    Fall Risk: Fall Risk  11/30/2019 10/20/2019 09/28/2019 09/09/2019 08/05/2019  Falls in the past year? 0 0 0 0 0  Number falls in past yr: 0 0 0 0 0  Injury with Fall? 0 - 0 0 0  Risk for fall due to : - - - - -  Risk for fall due to: Comment - - - - -  Follow up - Falls evaluation completed - Falls evaluation completed Falls prevention discussed     Assessment & Plan  1. Moderate malnutrition (HCC)  - clonazepam (KLONOPIN) 0.125 MG disintegrating tablet; Take 1 tablet (0.125 mg total) by mouth 2 (two) times daily.  Dispense: 60 tablet; Refill: 0- COMPLETE METABOLIC PANEL WITH GFR - Vitamin B12 - VITAMIN D 25 Hydroxy (Vit-D Deficiency, Fractures)   2. Moderate episode of recurrent major depressive disorder ([place])    3. Mood changes  - clonazepam (KLONOPIN) 0.125 MG disintegrating tablet;  Take 1 tablet (0.125 mg total) by mouth 2 (two) times daily.  Dispense: 60 tablet; Refill: 0  4. Hypothyroidism (acquired)   She needs to come in for labs TSH  5. Esophageal dysphagia  - clonazepam (KLONOPIN) 0.125 MG disintegrating tablet; Take 1 tablet (0.125 mg total) by mouth 2 (two) times daily.  Dispense: 60 tablet; Refill: 0  6. Gastroesophageal reflux disease with esophagitis without hemorrhage  On PPI an seen by GI  7. Other decreased white blood cell (WBC) count  - CBC with Differential/Platelet  8. Hyponatremia  - COMPLETE METABOLIC PANEL WITH GFR I discussed the assessment and treatment plan with the patient. The patient was provided an opportunity to ask questions and all were answered. The patient agreed with the plan and demonstrated an understanding of the instructions.   The patient was advised to call back or seek an in-person evaluation if the symptoms worsen or if the condition fails to improve as anticipated.  I provided 15 minutes of non-face-to-face time during this encounter.  Loistine Chance, MD

## 2020-01-22 ENCOUNTER — Encounter: Payer: Self-pay | Admitting: Family Medicine

## 2020-01-24 NOTE — Progress Notes (Deleted)
Psychiatric Initial Adult Assessment   Patient Identification: Maureen [name] MRN:  [id number] Date of Evaluation:  01/24/2020 Referral Source: *** Chief Complaint:   Visit Diagnosis: No diagnosis found.  History of Present Illness:   Maureen [name] is a 74 y.o. year old female with a history of depression, malnutrition, hypothyroidism , who is referred for depression.   ? miltazapine  According to the note by Dr. Marius Ditch, GI  Follow-up visit 01/02/2020 "Patient continues to lose weight. She reports that she has ill fitting dentures which makes it difficulty chewing food which has been an ongoing issue since she had the dentures. She underwent upper endoscopy which revealed nonobstructing Schatzki's ring. Esophageal biopsies were unremarkable. Patient developed fear of swallowing and she is worried about food getting stuck. She was referred to [facility] for esophageal manometry which has not been done yet. Currently, she manages to drink boost/Ensure about 2-3 times a day, able to swallow soft foods without any difficulty. She continues to take omeprazole 40 mg daily."  Colonoscopy 07/28/2019 - The entire examined colon is normal. - Non-bleeding external and internal hemorrhoids. - No specimens collected.  Upper endoscopy 11/11/2019 - Normal duodenal bulb and second portion of the duodenum. - Small hiatal hernia. - Normal stomach. - Non-obstructing and mild Schatzki ring. - Esophagogastric landmarks identified. - Normal esophagus. Biopsied.  DIAGNOSIS:  A. ESOPHAGUS; COLD BIOPSY:  - STRATIFIED SQUAMOUS EPITHELIUM WITH A FOCAL AREA OF REACTIVE  PARAKERATOSIS AND SPONGIOSIS SUGGESTIVE OF REFLUX ESOPHAGITIS.  - NEGATIVE FOR INTRAEPITHELIAL EOSINOPHILS, INFECTIOUS AGENTS, DYSPLASIA  AND MALIGNANCY.    Associated Signs/Symptoms: Depression Symptoms:  {DEPRESSION SYMPTOMS:20000} (Hypo) Manic Symptoms:  {BHH MANIC SYMPTOMS:22872} Anxiety Symptoms:  {BHH ANXIETY  SYMPTOMS:22873} Psychotic Symptoms:  {BHH PSYCHOTIC SYMPTOMS:22874} PTSD Symptoms: {BHH PTSD SYMPTOMS:22875}  Past Psychiatric History:  Outpatient:  Psychiatry admission:  Previous suicide attempt:  Past trials of medication:  History of violence:   Previous Psychotropic Medications: {YES/NO:21197}  Substance Abuse History in the last 12 months:  {yes no:314532}  Consequences of Substance Abuse: {BHH CONSEQUENCES OF SUBSTANCE ABUSE:22880}  Past Medical History:  Past Medical History:  Diagnosis Date  . Allergy   . Corn of toe   . First degree AV block   . GERD (gastroesophageal reflux disease)   . Hammer toe of right foot   . Hyperglycemia   . Hyperlipidemia   . Hypertension   . Loss of appetite   . Prolapse of urethra   . Scoliosis (and kyphoscoliosis), idiopathic   . Thyroid disease   . Tinnitus of both ears   . Vitreous floaters of right eye     Past Surgical History:  Procedure Laterality Date  . ABDOMINAL HYSTERECTOMY  1989  . APPENDECTOMY  1989  . BREAST EXCISIONAL BIOPSY Bilateral    multiple biopsies negative  . BREAST SURGERY     Several  . COLONOSCOPY WITH PROPOFOL N/A 07/28/2019   Procedure: COLONOSCOPY WITH PROPOFOL;  Surgeon: Lin Landsman, MD;  Location: [facility] ENDOSCOPY;  Service: Gastroenterology;  Laterality: N/A;  . ESOPHAGOGASTRODUODENOSCOPY (EGD) WITH PROPOFOL N/A 11/11/2019   Procedure: ESOPHAGOGASTRODUODENOSCOPY (EGD) WITH PROPOFOL;  Surgeon: Lin Landsman, MD;  Location: [facility] ENDOSCOPY;  Service: Gastroenterology;  Laterality: N/A;  . TUMOR EXCISION  1989   same time as hysterectomy    Family Psychiatric History: ***  Family History:  Family History  Problem Relation Age of Onset  . Diabetes Father 88       DM complications  . Hypertension Mother 63  CKD  . Thyroid disease Mother   . Heart failure Mother   . Kidney disease Mother   . Breast cancer Sister 54  . Kidney disease Other   . Breast cancer Maternal Aunt    . Breast cancer Cousin   . Anuerysm Son 51  . Diabetes Son     Social History:   Social History   Socioeconomic History  . Marital status: Widowed    Spouse name: Not on file  . Number of children: 3  . Years of education: 36  . Highest education level: 12th grade  Occupational History  . Occupation: Retired  Tobacco Use  . Smoking status: Never Smoker  . Smokeless tobacco: Never Used  . Tobacco comment: smoking cessation materials not required  Substance and Sexual Activity  . Alcohol use: Never    Alcohol/week: 0.0 standard drinks  . Drug use: No  . Sexual activity: Not Currently    Partners: Male  Other Topics Concern  . Not on file  Social History Narrative  . Not on file   Social Determinants of Health   Financial Resource Strain:   . Difficulty of Paying Living Expenses:   Food Insecurity:   . Worried About Programme researcher, broadcasting/film/video in the Last Year:   . Barista in the Last Year:   Transportation Needs:   . Freight forwarder (Medical):   Marland Kitchen Lack of Transportation (Non-Medical):   Physical Activity: Unknown  . Days of Exercise per Week: 3 days  . Minutes of Exercise per Session: Not on file  Stress:   . Feeling of Stress :   Social Connections:   . Frequency of Communication with Friends and Family:   . Frequency of Social Gatherings with Friends and Family:   . Attends Religious Services:   . Active Member of Clubs or Organizations:   . Attends Banker Meetings:   Marland Kitchen Marital Status:     Additional Social History: ***  Allergies:   Allergies  Allergen Reactions  . Aspirin     Tinnitus  . Citalopram Other (See Comments)  . Codeine Nausea And Vomiting  . Penicillins Rash    Has patient had a PCN reaction causing immediate rash, facial/tongue/throat swelling, SOB or lightheadedness with hypotension: Yes Has patient had a PCN reaction causing severe rash involving mucus membranes or skin necrosis: No Has patient had a PCN  reaction that required hospitalization No Has patient had a PCN reaction occurring within the last 10 years: No If all of the above answers are "NO", then may proceed with Cephalosporin use.    Metabolic Disorder Labs: Lab Results  Component Value Date   HGBA1C 5.9 (H) 06/17/2019   MPG 123 06/17/2019   MPG 126 09/30/2018   No results found for: PROLACTIN Lab Results  Component Value Date   CHOL 179 06/17/2019   TRIG 64 06/17/2019   HDL 45 (L) 06/17/2019   CHOLHDL 4.0 06/17/2019   VLDL 17 01/20/2017   LDLCALC 118 (H) 06/17/2019   LDLCALC 136 (H) 01/08/2018   Lab Results  Component Value Date   TSH 7.92 (H) 11/30/2019    Therapeutic Level Labs: No results found for: LITHIUM No results found for: CBMZ No results found for: VALPROATE  Current Medications: Current Outpatient Medications  Medication Sig Dispense Refill  . Ascorbic Acid (VITAMIN C) 1000 MG tablet Take 1,000 mg by mouth daily.    . clonazepam (KLONOPIN) 0.125 MG disintegrating tablet Take 1 tablet (  0.125 mg total) by mouth 2 (two) times daily. 60 tablet 0  . Garlic 1000 MG CAPS Take 1 capsule by mouth daily.     . Iron-Vitamins (GERITOL COMPLETE PO) Take 1 tablet by mouth daily.    Marland Kitchen levothyroxine (SYNTHROID) 88 MCG tablet Take 1 tablet (88 mcg total) by mouth daily before breakfast. 90 tablet 0  . lidocaine (XYLOCAINE) 2 % solution Use as directed 15 mLs in the mouth or throat every 6 (six) hours as needed for mouth pain. 100 mL 0  . omeprazole (PRILOSEC) 40 MG capsule Take 1 capsule (40 mg total) by mouth daily before breakfast. 90 capsule 3  . TURMERIC CURCUMIN PO Take 550 mg by mouth daily.     No current facility-administered medications for this visit.    Musculoskeletal: Strength & Muscle Tone: N/A Gait & Station: N/A Patient leans: N/A  Psychiatric Specialty Exam: Review of Systems  There were no vitals taken for this visit.There is no height or weight on file to calculate BMI.  General  Appearance: {Appearance:22683}  Eye Contact:  {BHH EYE CONTACT:22684}  Speech:  Clear and Coherent  Volume:  Normal  Mood:  {BHH MOOD:22306}  Affect:  {Affect (PAA):22687}  Thought Process:  Coherent  Orientation:  Full (Time, Place, and Person)  Thought Content:  Logical  Suicidal Thoughts:  {ST/HT (PAA):22692}  Homicidal Thoughts:  {ST/HT (PAA):22692}  Memory:  {BHH MEMORY:22881}  Judgement:  {Judgement (PAA):22694}  Insight:  {Insight (PAA):22695}  Psychomotor Activity:  {Psychomotor (PAA):22696}  Concentration:  {Concentration:21399}  Recall:  {BHH GOOD/FAIR/POOR:22877}  Fund of Knowledge:{BHH GOOD/FAIR/POOR:22877}  Language: {BHH GOOD/FAIR/POOR:22877}  Akathisia:  {BHH YES OR NO:22294}  Handed:  {Handed:22697}  AIMS (if indicated):  {Desc; done/not:10129}  Assets:  {Assets (PAA):22698}  ADL's:  {BHH ENI'D:78242}  Cognition: {chl bhh cognition:304700322}  Sleep:  {BHH GOOD/FAIR/POOR:22877}   Screenings: GAD-7     Office Visit from 12/22/2019 in [facility] Office Visit from 11/30/2019 in [facility] Office Visit from 01/11/2019 in [facility] Office Visit from 09/30/2018 in [facility] Office Visit from 06/02/2016 in [facility]  Total GAD-7 Score  12  7  3  2  16     PHQ2-9     Office Visit from 01/20/2020 in [facility] Office Visit from 12/22/2019 in [facility] Office Visit from 11/30/2019 in [facility] Office Visit from 10/20/2019 in [facility] Office Visit from 09/28/2019 in [facility]  PHQ-2 Total Score  3  4  3  1  1   PHQ-9 Total Score  11  15  12  2  11       Assessment and Plan: ***   [place], MD 3/30/20219:40 AM

## 2020-01-24 NOTE — Progress Notes (Deleted)
  Maureen [name] is a 74 y.o. year old female with a history of depression, malnutrition, hypothyroidism, who is referred for depression   Weight loss, denture  According to the note by Dr. Allegra Lai, GI  Follow-up visit 01/02/2020  Patient continues to lose weight.  She reports that she has ill fitting dentures which makes it difficulty chewing food which has been an ongoing issue since she had the dentures.  She underwent upper endoscopy which revealed nonobstructing Schatzki's ring.  Esophageal biopsies were unremarkable.  Patient developed fear of swallowing and she is worried about food getting stuck.  She was referred to [facility] for esophageal manometry which has not been done yet.  Currently, she manages to drink boost/Ensure about 2-3 times a day, able to swallow soft foods without any difficulty.  She continues to take omeprazole 40 mg daily.  NSAIDs: None  Antiplts/Anticoagulants/Anti thrombotics: None  GI Procedures: Colonoscopy in 2012 by Dr. Evette Cristal, normal  Colonoscopy 07/28/2019 - The entire examined colon is normal. - Non-bleeding external and internal hemorrhoids. - No specimens collected.  Upper endoscopy 11/11/2019 - Normal duodenal bulb and second portion of the duodenum. - Small hiatal hernia. - Normal stomach. - Non-obstructing and mild Schatzki ring. - Esophagogastric landmarks identified. - Normal esophagus. Biopsied.  DIAGNOSIS:  A. ESOPHAGUS; COLD BIOPSY:  - STRATIFIED SQUAMOUS EPITHELIUM WITH A FOCAL AREA OF REACTIVE  PARAKERATOSIS AND SPONGIOSIS SUGGESTIVE OF REFLUX ESOPHAGITIS.  - NEGATIVE FOR INTRAEPITHELIAL EOSINOPHILS, INFECTIOUS AGENTS, DYSPLASIA  AND MALIGNANCY.

## 2020-01-25 DIAGNOSIS — E039 Hypothyroidism, unspecified: Secondary | ICD-10-CM | POA: Diagnosis not present

## 2020-01-25 DIAGNOSIS — D72818 Other decreased white blood cell count: Secondary | ICD-10-CM | POA: Diagnosis not present

## 2020-01-25 DIAGNOSIS — E440 Moderate protein-calorie malnutrition: Secondary | ICD-10-CM | POA: Diagnosis not present

## 2020-01-25 DIAGNOSIS — I401 Isolated myocarditis: Secondary | ICD-10-CM | POA: Diagnosis not present

## 2020-01-25 DIAGNOSIS — E871 Hypo-osmolality and hyponatremia: Secondary | ICD-10-CM | POA: Diagnosis not present

## 2020-01-25 DIAGNOSIS — E559 Vitamin D deficiency, unspecified: Secondary | ICD-10-CM | POA: Diagnosis not present

## 2020-01-25 DIAGNOSIS — R748 Abnormal levels of other serum enzymes: Secondary | ICD-10-CM | POA: Diagnosis not present

## 2020-01-25 LAB — CBC WITH DIFFERENTIAL/PLATELET
Absolute Monocytes: 378 cells/uL (ref 200–950)
Basophils Absolute: 30 cells/uL (ref 0–200)
Basophils Relative: 1.2 %
Eosinophils Absolute: 30 cells/uL (ref 15–500)
Eosinophils Relative: 1.2 %
HCT: 36.8 % (ref 35.0–45.0)
Hemoglobin: 12.6 g/dL (ref 11.7–15.5)
Lymphs Abs: 1010 cells/uL (ref 850–3900)
MCH: 30.7 pg (ref 27.0–33.0)
MCHC: 34.2 g/dL (ref 32.0–36.0)
MCV: 89.5 fL (ref 80.0–100.0)
MPV: 11.5 fL (ref 7.5–12.5)
Monocytes Relative: 15.1 %
Neutro Abs: 1053 cells/uL — ABNORMAL LOW (ref 1500–7800)
Neutrophils Relative %: 42.1 %
Platelets: 186 10*3/uL (ref 140–400)
RBC: 4.11 10*6/uL (ref 3.80–5.10)
RDW: 13.8 % (ref 11.0–15.0)
Total Lymphocyte: 40.4 %
WBC: 2.5 10*3/uL — ABNORMAL LOW (ref 3.8–10.8)

## 2020-01-25 LAB — COMPLETE METABOLIC PANEL WITH GFR
AG Ratio: 1.3 (calc) (ref 1.0–2.5)
ALT: 11 U/L (ref 6–29)
AST: 24 U/L (ref 10–35)
Albumin: 4 g/dL (ref 3.6–5.1)
Alkaline phosphatase (APISO): 43 U/L (ref 37–153)
BUN/Creatinine Ratio: 6 (calc) (ref 6–22)
BUN: 4 mg/dL — ABNORMAL LOW (ref 7–25)
CO2: 34 mmol/L — ABNORMAL HIGH (ref 20–32)
Calcium: 9.7 mg/dL (ref 8.6–10.4)
Chloride: 89 mmol/L — ABNORMAL LOW (ref 98–110)
Creat: 0.66 mg/dL (ref 0.60–0.93)
GFR, Est African American: 101 mL/min/{1.73_m2} (ref >=60)
GFR, Est Non African American: 87 mL/min/{1.73_m2} (ref >=60)
Globulin: 3 g/dL (calc) (ref 1.9–3.7)
Glucose, Bld: 112 mg/dL — ABNORMAL HIGH (ref 65–99)
Potassium: 4.4 mmol/L (ref 3.5–5.3)
Sodium: 131 mmol/L — ABNORMAL LOW (ref 135–146)
Total Bilirubin: 0.5 mg/dL (ref 0.2–1.2)
Total Protein: 7 g/dL (ref 6.1–8.1)

## 2020-01-25 LAB — VITAMIN D 25 HYDROXY (VIT D DEFICIENCY, FRACTURES): Vit D, 25-Hydroxy: 16 ng/mL — ABNORMAL LOW (ref 30–100)

## 2020-01-25 LAB — TSH: TSH: 16.74 mIU/L — ABNORMAL HIGH (ref 0.40–4.50)

## 2020-01-25 LAB — VITAMIN B12: Vitamin B-12: 1254 pg/mL — ABNORMAL HIGH (ref 200–1100)

## 2020-01-26 ENCOUNTER — Ambulatory Visit (HOSPITAL_COMMUNITY): Payer: Self-pay | Admitting: Psychiatry

## 2020-01-26 NOTE — Progress Notes (Signed)
Virtual Visit via Video Note  I connected with Maureen [name] on 02/02/20 at 10:00 AM EDT by a video enabled telemedicine application and verified that I am speaking with the correct person using two identifiers.   I discussed the limitations of evaluation and management by telemedicine and the availability of in person appointments. The patient expressed understanding and agreed to proceed.   I discussed the assessment and treatment plan with the patient. The patient was provided an opportunity to ask questions and all were answered. The patient agreed with the plan and demonstrated an understanding of the instructions.   The patient was advised to call back or seek an in-person evaluation if the symptoms worsen or if the condition fails to improve as anticipated.  I provided 45 minutes of non-face-to-face time during this encounter.   Maureen Hotter, MD     Psychiatric Initial Adult Assessment   Patient Identification: Maureen [name] MRN:  [id number] Date of Evaluation:  02/02/2020 Referral Source: Maureen Cory, MD Chief Complaint:   Chief Complaint    Anxiety; Follow-up     Visit Diagnosis:    ICD-10-CM   1. Current mild episode of major depressive disorder without prior episode (HCC)  F32.0     History of Present Illness:   Maureen [name] is a 74 y.o. year old female with a history of depression, malnutrition, hypothyroidism, who is referred for depression.   Her son, Maureen [name] presents to the interview. Maureen [name] helps to provide some of the history.  She states that she has not been able to swallow due to the fear of choking.  She feels that something is stuck, and is unable to eat any solid food.  She is able to drink liquid which includes Ensure, and eats mashed potatoes.  She was able to eat food until last April.  Her denture was changed around the time, and she did not think it fit well with the other old denture. She started to feel that she is unable to break the food down  since then. She used to enjoy eating chicken, which she has not eaten since last year. She needs somebody to be present when she eats as she is afraid of choking. She visited ED around 15 times with concern of choking.   Depression- She states that she feels depressed and sorrow due to not being able to eat and losing weight. She also states that although she used to join choir, attending church for 20 years, she has not been able to go there due to pandemic. She attends online service. She states that she used to be very active. She used to visit and help people, and takes a walk. She has not been able to do it anymore due to her physical condition. She enjoys meeting with her family or watching TV. She reports passive SI, although she adamantly denies any plans/intent. Maureen [name] states that he can also feels her sorrow at times. She once told him that she was giving up and asked him to throw her from ditch, although he denies any safety concern.   Memory- According to Maureen [name], she gets spacy during the conversation at times due to some memory loss. She appears confused at times; forgetting to take medication.   She denies alcohol use or drug use.   Medication- clonazepam 0.125 mg twice a day  Functional Status Instrumental Activities of Daily Living (IADLs):  [name] is independent in the following: managing finances,  Requires assistance with the  following: medication  Activities of Daily Living (ADLs):  [name] is independent in the following: bathing and hygiene, feeding, continence, grooming and toileting, walking  According to the note by Maureen [name], GI  Follow-up visit 01/02/2020  Patient continues to lose weight. She reports that she has ill fitting dentures which makes it difficulty chewing food which has been an ongoing issue since she had the dentures. She underwent upper endoscopy which revealed nonobstructing Schatzki's ring. Esophageal biopsies were unremarkable. Patient  developed fear of swallowing and she is worried about food getting stuck. She was referred to [facility] for esophageal manometry which has not been done yet. Currently, she manages to drink boost/Ensure about 2-3 times a day, able to swallow soft foods without any difficulty. She continues to take omeprazole 40 mg daily.  NSAIDs: None  Antiplts/Anticoagulants/Anti thrombotics: None  GI Procedures: Colonoscopy in 2012 by Dr. Evette Cristal, normal  Colonoscopy 07/28/2019 - The entire examined colon is normal. - Non-bleeding external and internal hemorrhoids. - No specimens collected.  Upper endoscopy 11/11/2019 - Normal duodenal bulb and second portion of the duodenum. - Small hiatal hernia. - Normal stomach. - Non-obstructing and mild Schatzki ring. - Esophagogastric landmarks identified. - Normal esophagus. Biopsied.  DIAGNOSIS:  A. ESOPHAGUS; COLD BIOPSY:  - STRATIFIED SQUAMOUS EPITHELIUM WITH A FOCAL AREA OF REACTIVE  PARAKERATOSIS AND SPONGIOSIS SUGGESTIVE OF REFLUX ESOPHAGITIS.  - NEGATIVE FOR INTRAEPITHELIAL EOSINOPHILS, INFECTIOUS AGENTS, DYSPLASIA    Associated Signs/Symptoms: Depression Symptoms:  depressed mood, anhedonia, fatigue, hopelessness, impaired memory, (Hypo) Manic Symptoms:  denies decreased need for sleep, euphoria Anxiety Symptoms:  mild anxiety Psychotic Symptoms:  denies AH, VH, paranoia PTSD Symptoms: Negative  Past Psychiatric History:  Outpatient: denies  Psychiatry admission: denies  Previous suicide attempt: denies  Past trials of medication: clonazepam History of violence:   Previous Psychotropic Medications: Yes   Substance Abuse History in the last 12 months:  No.  Consequences of Substance Abuse: NA  Past Medical History:  Past Medical History:  Diagnosis Date  . Allergy   . Corn of toe   . First degree AV block   . GERD (gastroesophageal reflux disease)   . Hammer toe of right foot   . Hyperglycemia   . Hyperlipidemia   .  Hypertension   . Loss of appetite   . Prolapse of urethra   . Scoliosis (and kyphoscoliosis), idiopathic   . Thyroid disease   . Tinnitus of both ears   . Vitreous floaters of right eye     Past Surgical History:  Procedure Laterality Date  . ABDOMINAL HYSTERECTOMY  1989  . APPENDECTOMY  1989  . BREAST EXCISIONAL BIOPSY Bilateral    multiple biopsies negative  . BREAST SURGERY     Several  . COLONOSCOPY WITH PROPOFOL N/A 07/28/2019   Procedure: COLONOSCOPY WITH PROPOFOL;  Surgeon: Toney Reil, MD;  Location: [facility] ENDOSCOPY;  Service: Gastroenterology;  Laterality: N/A;  . ESOPHAGOGASTRODUODENOSCOPY (EGD) WITH PROPOFOL N/A 11/11/2019   Procedure: ESOPHAGOGASTRODUODENOSCOPY (EGD) WITH PROPOFOL;  Surgeon: Toney Reil, MD;  Location: [facility] ENDOSCOPY;  Service: Gastroenterology;  Laterality: N/A;  . TUMOR EXCISION  1989   same time as hysterectomy    Family Psychiatric History:  As below  Family History:  Family History  Problem Relation Age of Onset  . Diabetes Father 50       DM complications  . Hypertension Mother 17       CKD  . Thyroid disease Mother   . Heart failure Mother   .  Kidney disease Mother   . Depression Mother   . Breast cancer Sister 62  . Depression Sister   . Kidney disease Other   . Breast cancer Maternal Aunt   . Breast cancer Cousin   . Anuerysm Son 51  . Diabetes Son     Social History:   Social History   Socioeconomic History  . Marital status: Widowed    Spouse name: Not on file  . Number of children: 3  . Years of education: 2  . Highest education level: 12th grade  Occupational History  . Occupation: Retired  Tobacco Use  . Smoking status: Never Smoker  . Smokeless tobacco: Never Used  . Tobacco comment: smoking cessation materials not required  Substance and Sexual Activity  . Alcohol use: Never    Alcohol/week: 0.0 standard drinks  . Drug use: No  . Sexual activity: Not Currently    Partners: Male  Other Topics  Concern  . Not on file  Social History Narrative  . Not on file   Social Determinants of Health   Financial Resource Strain:   . Difficulty of Paying Living Expenses:   Food Insecurity:   . Worried About Programme researcher, broadcasting/film/video in the Last Year:   . Barista in the Last Year:   Transportation Needs:   . Freight forwarder (Medical):   Marland Kitchen Lack of Transportation (Non-Medical):   Physical Activity: Unknown  . Days of Exercise per Week: 3 days  . Minutes of Exercise per Session: Not on file  Stress:   . Feeling of Stress :   Social Connections:   . Frequency of Communication with Friends and Family:   . Frequency of Social Gatherings with Friends and Family:   . Attends Religious Services:   . Active Member of Clubs or Organizations:   . 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 Aspirin     Tinnitus  . Citalopram Other (See Comments)  . Codeine Nausea And Vomiting  . Penicillins Rash    Has patient had a PCN reaction causing immediate rash, facial/tongue/throat swelling, SOB or lightheadedness with hypotension: Yes Has patient had a PCN reaction causing severe rash involving mucus membranes or skin necrosis: No Has patient had a PCN reaction that required hospitalization No Has patient had a PCN reaction occurring within the last 10 years: No If all of the above answers are "NO", then may proceed with Cephalosporin use.    Metabolic Disorder Labs: Lab Results  Component Value Date   HGBA1C 5.9 (H) 06/17/2019   MPG 123 06/17/2019   MPG 126 09/30/2018   No results found for: PROLACTIN Lab Results  Component Value Date   CHOL 179 06/17/2019   TRIG 64 06/17/2019   HDL 45 (L) 06/17/2019   CHOLHDL 4.0  06/17/2019   VLDL 17 01/20/2017   LDLCALC 118 (H) 06/17/2019   LDLCALC 136 (H) 01/08/2018   Lab Results  Component Value Date   TSH 16.74 (H) 01/25/2020    Therapeutic Level Labs: No results found for: LITHIUM No results found for: CBMZ No results found for: VALPROATE  Current Medications: Current Outpatient Medications  Medication Sig Dispense Refill  . Ascorbic  Acid (VITAMIN C) 1000 MG tablet Take 1,000 mg by mouth daily.    . clonazepam (KLONOPIN) 0.125 MG disintegrating tablet Take 1 tablet (0.125 mg total) by mouth 2 (two) times daily. 60 tablet 0  . Garlic 0093 MG CAPS Take 1 capsule by mouth daily.     . Iron-Vitamins (GERITOL COMPLETE PO) Take 1 tablet by mouth daily.    Marland Kitchen levothyroxine (SYNTHROID) 88 MCG tablet Take 1 tablet (88 mcg total) by mouth daily before breakfast. 90 tablet 0  . lidocaine (XYLOCAINE) 2 % solution Use as directed 15 mLs in the mouth or throat every 6 (six) hours as needed for mouth pain. 100 mL 0  . mirtazapine (REMERON) 15 MG tablet 7.5 mg at night for two weeks, then 15 mg at night 30 tablet 1  . omeprazole (PRILOSEC) 40 MG capsule Take 1 capsule (40 mg total) by mouth daily before breakfast. 90 capsule 3  . TURMERIC CURCUMIN PO Take 550 mg by mouth daily.     No current facility-administered medications for this visit.    Musculoskeletal: Strength & Muscle Tone: N/A Gait & Station: N/A Patient leans: N/A  Psychiatric Specialty Exam: Review of Systems  Psychiatric/Behavioral: Positive for decreased concentration and dysphoric mood. Negative for agitation, behavioral problems, confusion, hallucinations, self-injury, sleep disturbance and suicidal ideas. The patient is nervous/anxious. The patient is not hyperactive.   All other systems reviewed and are negative.   There were no vitals taken for this visit.There is no height or weight on file to calculate BMI.  General Appearance: Fairly Groomed  Eye Contact:  Good  Speech:  Clear and  Coherent  Volume:  Normal  Mood:  Depressed  Affect:  Appropriate, Congruent and Restricted  Thought Process:  Coherent  Orientation:  Full (Time, Place, and Person)  Thought Content:  Logical  Suicidal Thoughts:  No  Homicidal Thoughts:  No  Memory:  Immediate;   Good  Judgement:  Good  Insight:  Fair  Psychomotor Activity:  Normal  Concentration:  Concentration: Good and Attention Span: Good  Recall:  Good  Fund of Knowledge:Good  Language: Good  Akathisia:  No  Handed:  Right  AIMS (if indicated):  not done  Assets:  Communication Skills Desire for Improvement  ADL's:  Intact  Cognition: Impaired,  Mild  Sleep:  Good   Screenings: GAD-7     Office Visit from 12/22/2019 in [facility] Office Visit from 11/30/2019 in [facility] Office Visit from 01/11/2019 in [facility]  Office Visit from 09/30/2018 in [facility] Office Visit from 06/02/2016 in [facility]  Total GAD-7 Score  12  7  3  2  16     PHQ2-9     Office Visit from 01/20/2020 in [facility] Office Visit from 12/22/2019 in [facility] Office Visit from 11/30/2019 in [facility] Office Visit from 10/20/2019 in [facility] Office Visit from 09/28/2019 in [facility]  PHQ-2 Total Score  3  4  3  1  1   PHQ-9 Total Score  11  15  12  2  11       Assessment and Plan:  LITSY EPTING is a 74 y.o. year old female with a history of depression, malnutrition, hypothyroidism, who is referred for depression.   # MDD, mild, single without psychotic features She reports depressive symptoms with fear of swallowing, which has been worsening  over the past several months.  She also does have appetite loss, and has had significant weight loss/malnutrition.  She is demoralized by the current state and limited activity due to aging.  Will start  mirtazapine to target depression and appetite loss.  Discussed potential risk of drowsiness.  Will continue clonazepam at this time given she and her son report great benefit from this medication.  Discussed risk of fall.  She will greatly benefit from supportive therapy/CBT; will make referral. She is encouraged to keep the appointment with speech therapist.   # r/o mid neurocognitive disorder Although she is alert and oriented during the interview, her son reports a few episodes of confusion in the past.  ADL independent, and no safety concern.  Will continue to assess.   Plan 1. Start mirtazapine 7.5 mg at night for two weeks, then 15 mg at night 2. Continue clonazepam 0.125 mg twice a day as needed for anxiety 3. Referral to therapy in [place] 4. Next appointment: 5/12 at 11:30 for 30 mins, video - She [facility]  The patient demonstrates the following risk factors for suicide: Chronic risk factors for suicide include: N/A. Acute risk factors for suicide include: unemployment. Protective factors for this patient include: positive social support. Considering these factors, the overall suicide risk at this point appears to be low. Patient is appropriate for outpatient follow up.   Maureen Hottereina Kinberly Perris, MD 4/8/202111:13 AM

## 2020-01-31 ENCOUNTER — Other Ambulatory Visit: Payer: Self-pay | Admitting: Family Medicine

## 2020-01-31 DIAGNOSIS — D708 Other neutropenia: Secondary | ICD-10-CM

## 2020-02-02 ENCOUNTER — Encounter (HOSPITAL_COMMUNITY): Payer: Self-pay | Admitting: Psychiatry

## 2020-02-02 ENCOUNTER — Other Ambulatory Visit: Payer: Self-pay

## 2020-02-02 ENCOUNTER — Telehealth: Payer: Self-pay

## 2020-02-02 ENCOUNTER — Ambulatory Visit (INDEPENDENT_AMBULATORY_CARE_PROVIDER_SITE_OTHER): Payer: Medicare HMO | Admitting: Psychiatry

## 2020-02-02 DIAGNOSIS — F320 Major depressive disorder, single episode, mild: Secondary | ICD-10-CM

## 2020-02-02 MED ORDER — MIRTAZAPINE 15 MG PO TABS: ORAL_TABLET | ORAL | 1 refills | Status: DC

## 2020-02-02 NOTE — Patient Instructions (Signed)
1. Start mirtazapine 7.5 mg at night for two weeks, then 15 mg at night 2. Continue clonazepam 0.125 mg twice a day as needed for anxiety 3. Referral to therapy in [place] 4. Next appointment: 5/12 at 11:30

## 2020-02-10 NOTE — Progress Notes (Signed)
[place]  [phone]) [phone] [phone]) [phone]  ID: RAELLE CHAMBERS OB: [birth year]  MR#: [id number]  IRS#:[id number]  Patient Care Team: Steele Sizer, MD as PCP - General (Family Medicine) Anell Barr, OD as Consulting Physician (Optometry) Lucky Cowboy, Erskine Squibb, MD as Consulting Physician (Vascular Surgery)  CHIEF COMPLAINT: Neutropenia.  INTERVAL HISTORY: Patient is a 74 year old female who was noted to have a persistent neutropenia on routine blood work.  She is also undergoing evaluation for difficulty swallowing and weight loss over the past 6 months.  Because of her difficulty swallowing, she has a poor appetite and minimal p.o. intake.  She has no neurologic complaints.  She denies any recent fevers or illnesses.  She has no chest pain, shortness of breath, cough, or hemoptysis.  She denies any nausea, vomiting, constipation, or diarrhea.  She has no urinary complaints.  Patient otherwise feels well and offers no further specific complaints today.  REVIEW OF SYSTEMS:   Review of Systems  Constitutional: Positive for malaise/fatigue and weight loss. Negative for fever.  Respiratory: Negative.  Negative for cough, hemoptysis and shortness of breath.   Cardiovascular: Negative.  Negative for chest pain and leg swelling.  Gastrointestinal: Negative.  Negative for abdominal pain, nausea and vomiting.  Genitourinary: Negative.  Negative for dysuria.  Musculoskeletal: Negative.  Negative for back pain.  Skin: Negative.  Negative for rash.  Neurological: Positive for weakness. Negative for dizziness, focal weakness and headaches.  Psychiatric/Behavioral: Negative.  The patient is not nervous/anxious.     As per HPI. Otherwise, a complete review of systems is negative.  PAST MEDICAL HISTORY: Past Medical History:  Diagnosis Date  . Allergy   . Corn of toe   . First degree AV block   . GERD (gastroesophageal reflux disease)   . Hammer toe of right foot   .  Hyperglycemia   . Hyperlipidemia   . Hypertension   . Loss of appetite   . Prolapse of urethra   . Scoliosis (and kyphoscoliosis), idiopathic   . Thyroid disease   . Tinnitus of both ears   . Vitreous floaters of right eye     PAST SURGICAL HISTORY: Past Surgical History:  Procedure Laterality Date  . ABDOMINAL HYSTERECTOMY  1989  . APPENDECTOMY  1989  . BREAST EXCISIONAL BIOPSY Bilateral    multiple biopsies negative  . BREAST SURGERY     Several  . COLONOSCOPY WITH PROPOFOL N/A 07/28/2019   Procedure: COLONOSCOPY WITH PROPOFOL;  Surgeon: Lin Landsman, MD;  Location[facility] ENDOSCOPY;  Service: Gastroenterology;  Laterality: N/A;  . ESOPHAGOGASTRODUODENOSCOPY (EGD) WITH PROPOFOL N/A 11/11/2019   Procedure: ESOPHAGOGASTRODUODENOSCOPY (EGD) WITH PROPOFOL;  Surgeon: Lin Landsman, MD;  Location: [facility] ENDOSCOPY;  Service: Gastroenterology;  Laterality: N/A;  . TUMOR EXCISION  1989   same time as hysterectomy    FAMILY HISTORY: Family History  Problem Relation Age of Onset  . Diabetes Father 37       DM complications  . Hypertension Mother 69       CKD  . Thyroid disease Mother   . Heart failure Mother   . Kidney disease Mother   . Depression Mother   . Breast cancer Sister 16  . Depression Sister   . Kidney disease Other   . Breast cancer Maternal Aunt   . Breast cancer Cousin   . Anuerysm Son 64  . Diabetes Son     ADVANCED DIRECTIVES (Y/N):  N  HEALTH MAINTENANCE: Social History  Tobacco Use  . Smoking status: Never Smoker  . Smokeless tobacco: Never Used  . Tobacco comment: smoking cessation materials not required  Substance Use Topics  . Alcohol use: Never    Alcohol/week: 0.0 standard drinks  . Drug use: No     Colonoscopy:  PAP:  Bone density:  Lipid panel:  Allergies  Allergen Reactions  . Aspirin     Tinnitus  . Citalopram Other (See Comments)  . Codeine Nausea And Vomiting  . Penicillins Rash    Has patient had a PCN reaction  causing immediate rash, facial/tongue/throat swelling, SOB or lightheadedness with hypotension: Yes Has patient had a PCN reaction causing severe rash involving mucus membranes or skin necrosis: No Has patient had a PCN reaction that required hospitalization No Has patient had a PCN reaction occurring within the last 10 years: No If all of the above answers are "NO", then may proceed with Cephalosporin use.    Current Outpatient Medications  Medication Sig Dispense Refill  . Ascorbic Acid (VITAMIN C) 1000 MG tablet Take 1,000 mg by mouth daily.    . clonazepam (KLONOPIN) 0.125 MG disintegrating tablet Take 1 tablet (0.125 mg total) by mouth 2 (two) times daily. 60 tablet 0  . levothyroxine (SYNTHROID) 88 MCG tablet Take 1 tablet (88 mcg total) by mouth daily before breakfast. 90 tablet 0  . lidocaine (XYLOCAINE) 2 % solution Use as directed 15 mLs in the mouth or throat every 6 (six) hours as needed for mouth pain. 100 mL 0  . omeprazole (PRILOSEC) 40 MG capsule Take 1 capsule (40 mg total) by mouth daily before breakfast. 90 capsule 3  . Garlic 9675 MG CAPS Take 1 capsule by mouth daily.     . Iron-Vitamins (GERITOL COMPLETE PO) Take 1 tablet by mouth daily.    . mirtazapine (REMERON) 15 MG tablet 7.5 mg at night for two weeks, then 15 mg at night (Patient not taking: Reported on 02/13/2020) 30 tablet 1  . TURMERIC CURCUMIN PO Take 550 mg by mouth daily.     No current facility-administered medications for this visit.    OBJECTIVE: Vitals:   02/13/20 1500  BP: 121/84  Pulse: 84  Temp: 98 F (36.7 C)  SpO2: 100%     Body mass index is 16.36 kg/m.    ECOG FS:1 - Symptomatic but completely ambulatory  General: Thin, no acute distress. Eyes: Pink conjunctiva, anicteric sclera. HEENT: Normocephalic, moist mucous membranes. Lungs: No audible wheezing or coughing. Heart: Regular rate and rhythm. Abdomen: Soft, nontender, no obvious distention. Musculoskeletal: No edema, cyanosis, or  clubbing. Neuro: Alert, answering all questions appropriately. Cranial nerves grossly intact. Skin: No rashes or petechiae noted. Psych: Normal affect. Lymphatics: No cervical, calvicular, axillary or inguinal LAD.   LAB RESULTS:  Lab Results  Component Value Date   NA 131 (L) 01/25/2020   K 4.4 01/25/2020   CL 89 (L) 01/25/2020   CO2 34 (H) 01/25/2020   GLUCOSE 112 (H) 01/25/2020   BUN 4 (L) 01/25/2020   CREATININE 0.66 01/25/2020   CALCIUM 9.7 01/25/2020   PROT 7.0 01/25/2020   ALBUMIN 4.0 11/19/2019   AST 24 01/25/2020   ALT 11 01/25/2020   ALKPHOS 46 11/19/2019   BILITOT 0.5 01/25/2020   GFRNONAA 87 01/25/2020   GFRAA 101 01/25/2020    Lab Results  Component Value Date   WBC 2.5 (L) 02/13/2020   NEUTROABS 0.8 (L) 02/13/2020   HGB 12.4 02/13/2020   HCT 35.5 (L) 02/13/2020  MCV 88.3 02/13/2020   PLT 179 02/13/2020     STUDIES: No results found.  ASSESSMENT: Neutropenia  PLAN:    1. Neutropenia: Patient's total white blood cell count and ANC remain decreased, but essentially stable.  Suspect this may be been nutritional given her poor p.o. intake.  Folate, B12, iron stores, and LDH are all within normal limits.  Peripheral blood flow cytometry, neutrophil antibodies, and [employer] Myeloid panel are all pending at time of dictation.  No intervention is needed at this time.  Patient does not require bone marrow biopsy.  Patient will have video assisted telemedicine visit in 3 weeks to discuss her results and any further diagnostic testing necessary.   2.  Weight loss: Patient had CT of the chest and neck in January 2021 that did not reveal any distinct pathology.  Patient has also had endoscopic evaluation by GI that was reported as unrevealing.  She has instructed to continue her follow-up with GI as scheduled.  Return to clinic as above.  Consider referral to dietary if no improvement.  I spent a total of 45 minutes reviewing chart data, face-to-face evaluation  with the patient, counseling and coordination of care as detailed above.   Patient expressed understanding and was in agreement with this plan. She also understands that She can call clinic at any time with any questions, concerns, or complaints.    Lloyd Huger, MD   02/13/2020 4:56 PM

## 2020-02-13 ENCOUNTER — Encounter: Payer: Self-pay | Admitting: Oncology

## 2020-02-13 ENCOUNTER — Inpatient Hospital Stay: Payer: Medicare HMO

## 2020-02-13 ENCOUNTER — Inpatient Hospital Stay: Payer: Medicare HMO | Attending: Oncology | Admitting: Oncology

## 2020-02-13 ENCOUNTER — Other Ambulatory Visit: Payer: Self-pay

## 2020-02-13 VITALS — BP 121/84 | HR 84 | Temp 98.0°F | Wt 95.3 lb

## 2020-02-13 DIAGNOSIS — D709 Neutropenia, unspecified: Secondary | ICD-10-CM

## 2020-02-13 DIAGNOSIS — Z803 Family history of malignant neoplasm of breast: Secondary | ICD-10-CM | POA: Diagnosis not present

## 2020-02-13 DIAGNOSIS — I440 Atrioventricular block, first degree: Secondary | ICD-10-CM | POA: Diagnosis not present

## 2020-02-13 DIAGNOSIS — R531 Weakness: Secondary | ICD-10-CM | POA: Diagnosis not present

## 2020-02-13 DIAGNOSIS — R630 Anorexia: Secondary | ICD-10-CM | POA: Insufficient documentation

## 2020-02-13 DIAGNOSIS — R5383 Other fatigue: Secondary | ICD-10-CM | POA: Diagnosis not present

## 2020-02-13 DIAGNOSIS — R634 Abnormal weight loss: Secondary | ICD-10-CM | POA: Insufficient documentation

## 2020-02-13 DIAGNOSIS — K219 Gastro-esophageal reflux disease without esophagitis: Secondary | ICD-10-CM | POA: Insufficient documentation

## 2020-02-13 DIAGNOSIS — I10 Essential (primary) hypertension: Secondary | ICD-10-CM | POA: Insufficient documentation

## 2020-02-13 DIAGNOSIS — E785 Hyperlipidemia, unspecified: Secondary | ICD-10-CM | POA: Insufficient documentation

## 2020-02-13 LAB — CBC WITH DIFFERENTIAL/PLATELET
Abs Immature Granulocytes: 0 10*3/uL (ref 0.00–0.07)
Basophils Absolute: 0 10*3/uL (ref 0.0–0.1)
Basophils Relative: 1 %
Eosinophils Absolute: 0 10*3/uL (ref 0.0–0.5)
Eosinophils Relative: 2 %
HCT: 35.5 % — ABNORMAL LOW (ref 36.0–46.0)
Hemoglobin: 12.4 g/dL (ref 12.0–15.0)
Immature Granulocytes: 0 %
Lymphocytes Relative: 50 %
Lymphs Abs: 1.2 10*3/uL (ref 0.7–4.0)
MCH: 30.8 pg (ref 26.0–34.0)
MCHC: 34.9 g/dL (ref 30.0–36.0)
MCV: 88.3 fL (ref 80.0–100.0)
Monocytes Absolute: 0.4 10*3/uL (ref 0.1–1.0)
Monocytes Relative: 14 %
Neutro Abs: 0.8 10*3/uL — ABNORMAL LOW (ref 1.7–7.7)
Neutrophils Relative %: 33 %
Platelets: 179 10*3/uL (ref 150–400)
RBC: 4.02 MIL/uL (ref 3.87–5.11)
RDW: 14.3 % (ref 11.5–15.5)
WBC: 2.5 10*3/uL — ABNORMAL LOW (ref 4.0–10.5)
nRBC: 0 % (ref 0.0–0.2)

## 2020-02-13 LAB — IRON AND TIBC
Iron: 53 ug/dL (ref 28–170)
Saturation Ratios: 18 % (ref 10.4–31.8)
TIBC: 288 ug/dL (ref 250–450)
UIBC: 235 ug/dL

## 2020-02-13 LAB — FOLATE: Folate: 13.5 ng/mL (ref >5.9)

## 2020-02-13 LAB — FERRITIN: Ferritin: 201 ng/mL (ref 11–307)

## 2020-02-13 LAB — LACTATE DEHYDROGENASE: LDH: 157 U/L (ref 98–192)

## 2020-02-13 NOTE — Progress Notes (Signed)
New patient here today for initial visit. Patient denies pain. Does states she has problems eating solid foods and taking some vitamins. States she has problems swallowing.

## 2020-02-15 ENCOUNTER — Ambulatory Visit
Admission: RE | Admit: 2020-02-15 | Discharge: 2020-02-15 | Disposition: A | Discharge status: Home / Self Care | Payer: Medicare HMO | Source: Ambulatory Visit | Attending: Family Medicine | Admitting: Family Medicine

## 2020-02-15 DIAGNOSIS — Z1231 Encounter for screening mammogram for malignant neoplasm of breast: Secondary | ICD-10-CM | POA: Diagnosis not present

## 2020-02-16 LAB — COMP PANEL: LEUKEMIA/LYMPHOMA

## 2020-02-22 LAB — NEUTROPHIL AB TEST LEVEL 1: NEUTROPHIL SCR/PANEL INTERP.: NEGATIVE

## 2020-02-27 ENCOUNTER — Ambulatory Visit: Payer: Medicare HMO | Admitting: Gastroenterology

## 2020-03-01 ENCOUNTER — Encounter: Payer: Self-pay | Admitting: Family Medicine

## 2020-03-01 DIAGNOSIS — E559 Vitamin D deficiency, unspecified: Secondary | ICD-10-CM | POA: Insufficient documentation

## 2020-03-01 NOTE — Progress Notes (Unsigned)
BH MD/PA/NP OP Progress Note  03/01/2020 2:12 PM Maureen [name]  MRN:  [id number]  Chief Complaint:  HPI:  - She was seen for neutropenia by oncologist. R/o neutritional cause. No further intervention is needed.   Visit Diagnosis: No diagnosis found.  Past Psychiatric History: Please see initial evaluation for full details. I have reviewed the history. No updates at this time.     Past Medical History:  Past Medical History:  Diagnosis Date  . Allergy   . Corn of toe   . First degree AV block   . GERD (gastroesophageal reflux disease)   . Hammer toe of right foot   . Hyperglycemia   . Hyperlipidemia   . Hypertension   . Loss of appetite   . Prolapse of urethra   . Scoliosis (and kyphoscoliosis), idiopathic   . Thyroid disease   . Tinnitus of both ears   . Vitreous floaters of right eye     Past Surgical History:  Procedure Laterality Date  . ABDOMINAL HYSTERECTOMY  1989  . APPENDECTOMY  1989  . BREAST EXCISIONAL BIOPSY Bilateral    multiple biopsies negative  . BREAST SURGERY     Several  . COLONOSCOPY WITH PROPOFOL N/A 07/28/2019   Procedure: COLONOSCOPY WITH PROPOFOL;  Surgeon: Lin Landsman, MD;  Location: [facility] ENDOSCOPY;  Service: Gastroenterology;  Laterality: N/A;  . ESOPHAGOGASTRODUODENOSCOPY (EGD) WITH PROPOFOL N/A 11/11/2019   Procedure: ESOPHAGOGASTRODUODENOSCOPY (EGD) WITH PROPOFOL;  Surgeon: Lin Landsman, MD;  Location: [facility] ENDOSCOPY;  Service: Gastroenterology;  Laterality: N/A;  . TUMOR EXCISION  1989   same time as hysterectomy    Family Psychiatric History: Please see initial evaluation for full details. I have reviewed the history. No updates at this time.     Family History:  Family History  Problem Relation Age of Onset  . Diabetes Father 81       DM complications  . Hypertension Mother 53       CKD  . Thyroid disease Mother   . Heart failure Mother   . Kidney disease Mother   . Depression Mother   . Breast cancer Sister 65   . Depression Sister   . Kidney disease Other   . Breast cancer Maternal Aunt   . Breast cancer Cousin   . Anuerysm Son 45  . Diabetes Son     Social History:  Social History   Socioeconomic History  . Marital status: Widowed    Spouse name: Not on file  . Number of children: 3  . Years of education: 14  . Highest education level: 12th grade  Occupational History  . Occupation: Retired  Tobacco Use  . Smoking status: Never Smoker  . Smokeless tobacco: Never Used  . Tobacco comment: smoking cessation materials not required  Substance and Sexual Activity  . Alcohol use: Never    Alcohol/week: 0.0 standard drinks  . Drug use: No  . Sexual activity: Not Currently    Partners: Male  Other Topics Concern  . Not on file  Social History Narrative  . Not on file   Social Determinants of Health   Financial Resource Strain:   . Difficulty of Paying Living Expenses:   Food Insecurity:   . Worried About Charity fundraiser in the Last Year:   . Arboriculturist in the Last Year:   Transportation Needs:   . Film/video editor (Medical):   Marland Kitchen Lack of Transportation (Non-Medical):   Physical Activity: Unknown  .  Days of Exercise per Week: 3 days  . Minutes of Exercise per Session: Not on file  Stress:   . Feeling of Stress :   Social Connections:   . Frequency of Communication with Friends and Family:   . Frequency of Social Gatherings with Friends and Family:   . Attends Religious Services:   . Active Member of Clubs or Organizations:   . Attends Banker Meetings:   Marland Kitchen Marital Status:     Allergies:  Allergies  Allergen Reactions  . Aspirin     Tinnitus  . Citalopram Other (See Comments)  . Codeine Nausea And Vomiting  . Penicillins Rash    Has patient had a PCN reaction causing immediate rash, facial/tongue/throat swelling, SOB or lightheadedness with hypotension: Yes Has patient had a PCN reaction causing severe rash involving mucus membranes or  skin necrosis: No Has patient had a PCN reaction that required hospitalization No Has patient had a PCN reaction occurring within the last 10 years: No If all of the above answers are "NO", then may proceed with Cephalosporin use.    Metabolic Disorder Labs: Lab Results  Component Value Date   HGBA1C 5.9 (H) 06/17/2019   MPG 123 06/17/2019   MPG 126 09/30/2018   No results found for: PROLACTIN Lab Results  Component Value Date   CHOL 179 06/17/2019   TRIG 64 06/17/2019   HDL 45 (L) 06/17/2019   CHOLHDL 4.0 06/17/2019   VLDL 17 01/20/2017   LDLCALC 118 (H) 06/17/2019   LDLCALC 136 (H) 01/08/2018   Lab Results  Component Value Date   TSH 16.74 (H) 01/25/2020   TSH 7.92 (H) 11/30/2019    Therapeutic Level Labs: No results found for: LITHIUM No results found for: VALPROATE No components found for:  CBMZ  Current Medications: Current Outpatient Medications  Medication Sig Dispense Refill  . Ascorbic Acid (VITAMIN C) 1000 MG tablet Take 1,000 mg by mouth daily.    . clonazepam (KLONOPIN) 0.125 MG disintegrating tablet Take 1 tablet (0.125 mg total) by mouth 2 (two) times daily. 60 tablet 0  . Garlic 1000 MG CAPS Take 1 capsule by mouth daily.     . Iron-Vitamins (GERITOL COMPLETE PO) Take 1 tablet by mouth daily.    Marland Kitchen levothyroxine (SYNTHROID) 88 MCG tablet Take 1 tablet (88 mcg total) by mouth daily before breakfast. 90 tablet 0  . lidocaine (XYLOCAINE) 2 % solution Use as directed 15 mLs in the mouth or throat every 6 (six) hours as needed for mouth pain. 100 mL 0  . mirtazapine (REMERON) 15 MG tablet 7.5 mg at night for two weeks, then 15 mg at night (Patient not taking: Reported on 02/13/2020) 30 tablet 1  . omeprazole (PRILOSEC) 40 MG capsule Take 1 capsule (40 mg total) by mouth daily before breakfast. 90 capsule 3  . TURMERIC CURCUMIN PO Take 550 mg by mouth daily.     No current facility-administered medications for this visit.     Musculoskeletal: Strength &  Muscle Tone: N/A Gait & Station: N/A Patient leans: N/A  Psychiatric Specialty Exam: Review of Systems  There were no vitals taken for this visit.There is no height or weight on file to calculate BMI.  General Appearance: {Appearance:22683}  Eye Contact:  {BHH EYE CONTACT:22684}  Speech:  Clear and Coherent  Volume:  Normal  Mood:  {BHH MOOD:22306}  Affect:  {Affect (PAA):22687}  Thought Process:  Coherent  Orientation:  Full (Time, Place, and Person)  Thought Content:  Logical   Suicidal Thoughts:  {ST/HT (PAA):22692}  Homicidal Thoughts:  {ST/HT (PAA):22692}  Memory:  Immediate;   Good  Judgement:  {Judgement (PAA):22694}  Insight:  {Insight (PAA):22695}  Psychomotor Activity:  Normal  Concentration:  Concentration: Good and Attention Span: Good  Recall:  Good  Fund of Knowledge: Good  Language: Good  Akathisia:  No  Handed:  Right  AIMS (if indicated): not done  Assets:  Communication Skills Desire for Improvement  ADL's:  Intact  Cognition: WNL  Sleep:  {BHH GOOD/FAIR/POOR:22877}   Screenings: GAD-7     Office Visit from 12/22/2019 in [facility] Office Visit from 11/30/2019 in [facility] Office Visit from 01/11/2019 in [facility] Office Visit from 09/30/2018 in [facility] Office Visit from 06/02/2016 in [facility]  Total GAD-7 Score  12  7  3  2  16     PHQ2-9     Office Visit from 01/20/2020 in [facility] Office Visit from 12/22/2019 in [facility] Office Visit from 11/30/2019 in [facility] Office Visit from 10/20/2019 in [facility] Office Visit from 09/28/2019 in [facility]  PHQ-2 Total Score  3  4  3  1  1   PHQ-9 Total Score  11  15  12  2  11        Assessment and Plan:  Maureen [name] is a 74 y.o. year old female with a history of depression, malnutrition,  hypothyroidism , who presents for follow up appointment for No diagnosis found.  # MDD, mild, single without psychotic features   She reports depressive symptoms with fear of swallowing, which has been worsening over the past several months.  She also does have appetite loss, and has had significant weight loss/malnutrition.  She is demoralized by the current state and limited activity due to aging.  Will start mirtazapine to target depression and appetite loss.  Discussed potential risk of drowsiness.  Will continue clonazepam at this time given she and her son report great benefit from this medication.  Discussed risk of fall.  She will greatly benefit from supportive therapy/CBT; will make referral. She is encouraged to keep the appointment with speech therapist.   # r/o mild neurocognitive disorder Although she is alert and oriented during the interview, her son reports a few episodes of confusion in the past.  ADL independent, and no safety concern.  Will continue to assess.   Plan 1. Start mirtazapine 7.5 mg at night for two weeks, then 15 mg at night 2. Continue clonazepam 0.125 mg twice a day as needed for anxiety 3. Referral to therapy in [place] 4. Next appointment: 5/12 at 11:30 for 30 mins, video - She has an appointment with speech therapy at [facility]  The patient demonstrates the following risk factors for suicide: Chronic risk factors for suicide include: N/A. Acute risk factors for suicide include: unemployment. Protective factors for this patient include: positive social support. Considering these factors, the overall suicide risk at this point appears to be low. Patient is appropriate for outpatient follow up.  66, MD 03/01/2020, 2:12 PM

## 2020-03-02 NOTE — Progress Notes (Signed)
  [facility]  [phone]) [phone] Fax:(336) [phone]  ID: Maureen [name] OB: [birth year]  MR#: [id number]  TWK#:[id number]  Patient Care Team: Alba Cory, MD as PCP - General (Family Medicine) Isla Pence, OD as Consulting Physician (Optometry) Wyn Quaker, Marlow Baars, MD as Consulting Physician (Vascular Surgery) Jeralyn Ruths, MD as Consulting Physician (Oncology)   Jeralyn Ruths, MD   03/06/2020 6:18 AM     This encounter was created in error - please disregard.

## 2020-03-05 ENCOUNTER — Other Ambulatory Visit: Payer: Self-pay

## 2020-03-05 ENCOUNTER — Inpatient Hospital Stay: Payer: Medicare HMO | Admitting: Oncology

## 2020-03-05 ENCOUNTER — Encounter: Payer: Self-pay | Admitting: Oncology

## 2020-03-05 NOTE — Telephone Encounter (Signed)
error 

## 2020-03-05 NOTE — Progress Notes (Signed)
Pt prescreened for appointment with assistance of son. Son reports that pt has still been having difficulty with swallowing thicker consistency liquids and has fear surrounding attempting to eat solid food. Has been using Ensure to get extra protein and calories, and son reports pt eating ice cream to get extra calories. Per son, pt has not lost any weight but hasn't appeared to gain any weight either. Son has no specific questions or concerns prior to virtual visit.

## 2020-03-07 ENCOUNTER — Telehealth (HOSPITAL_COMMUNITY): Payer: Self-pay | Admitting: Psychiatry

## 2020-03-07 ENCOUNTER — Other Ambulatory Visit: Payer: Self-pay

## 2020-03-07 ENCOUNTER — Telehealth (HOSPITAL_COMMUNITY): Payer: Medicare HMO | Admitting: Psychiatry

## 2020-03-07 ENCOUNTER — Encounter: Payer: Self-pay | Admitting: Oncology

## 2020-03-07 NOTE — Telephone Encounter (Signed)
Sent link for video visit through Epic. Patient did not sign in. Called the patient for appointment today. Her son states that he cancelled on automatic message yesterday. The appointment will be rescheduled.

## 2020-03-08 ENCOUNTER — Inpatient Hospital Stay: Payer: Medicare HMO | Attending: Oncology | Admitting: Oncology

## 2020-03-08 DIAGNOSIS — D709 Neutropenia, unspecified: Secondary | ICD-10-CM | POA: Diagnosis not present

## 2020-03-08 NOTE — Progress Notes (Signed)
[place]  Telephone:(336) [phone] Fax:(336) [phone]  ID: Maureen [name] OB: [birth year]  MR#: [id number]  AOZ#:[id number]  Patient Care Team: Steele Sizer, MD as PCP - General (Family Medicine) Anell Barr, Bonham as Consulting Physician (Optometry) Lucky Cowboy, Erskine Squibb, MD as Consulting Physician (Vascular Surgery) Lloyd Huger, MD as Consulting Physician (Oncology)  I connected with Maureen [name] on 03/09/20 at  3:00 PM EDT by video enabled telemedicine visit and verified that I am speaking with the correct person using two identifiers.   I discussed the limitations, risks, security and privacy concerns of performing an evaluation and management service by telemedicine and the availability of in-person appointments. I also discussed with the patient that there may be a patient responsible charge related to this service. The patient expressed understanding and agreed to proceed.   Other persons participating in the visit and their role in the encounter: Patient, patient's son, MD.  Patient's location: Home. Provider's location: Clinic.  CHIEF COMPLAINT: Neutropenia.  INTERVAL HISTORY: Patient agreed to video enabled telemedicine visit for further evaluation and discussion of her laboratory results.  She has improved caloric intake over the past several weeks and her son does not report any additional weight loss.  She has no neurologic complaints.  She denies any recent fevers or illnesses.  She has no chest pain, shortness of breath, cough, or hemoptysis.  She denies any nausea, vomiting, constipation, or diarrhea.  She has no urinary complaints.  Patient offers no specific complaints today.  REVIEW OF SYSTEMS:   Review of Systems  Constitutional: Negative.  Negative for fever, malaise/fatigue and weight loss.  Respiratory: Negative.  Negative for cough, hemoptysis and shortness of breath.   Cardiovascular: Negative.  Negative for chest pain and leg swelling.   Gastrointestinal: Negative.  Negative for abdominal pain, nausea and vomiting.  Genitourinary: Negative.  Negative for dysuria.  Musculoskeletal: Negative.  Negative for back pain.  Skin: Negative.  Negative for rash.  Neurological: Negative.  Negative for dizziness, focal weakness, weakness and headaches.  Psychiatric/Behavioral: Negative.  The patient is not nervous/anxious.     As per HPI. Otherwise, a complete review of systems is negative.  PAST MEDICAL HISTORY: Past Medical History:  Diagnosis Date  . Allergy   . Corn of toe   . First degree AV block   . GERD (gastroesophageal reflux disease)   . Hammer toe of right foot   . Hyperglycemia   . Hyperlipidemia   . Hypertension   . Loss of appetite   . Prolapse of urethra   . Scoliosis (and kyphoscoliosis), idiopathic   . Thyroid disease   . Tinnitus of both ears   . Vitreous floaters of right eye     PAST SURGICAL HISTORY: Past Surgical History:  Procedure Laterality Date  . ABDOMINAL HYSTERECTOMY  1989  . APPENDECTOMY  1989  . BREAST EXCISIONAL BIOPSY Bilateral    multiple biopsies negative  . BREAST SURGERY     Several  . COLONOSCOPY WITH PROPOFOL N/A 07/28/2019   Procedure: COLONOSCOPY WITH PROPOFOL;  Surgeon: Lin Landsman, MD;  Location: [facility] ENDOSCOPY;  Service: Gastroenterology;  Laterality: N/A;  . ESOPHAGOGASTRODUODENOSCOPY (EGD) WITH PROPOFOL N/A 11/11/2019   Procedure: ESOPHAGOGASTRODUODENOSCOPY (EGD) WITH PROPOFOL;  Surgeon: Lin Landsman, MD;  Location: [facility] ENDOSCOPY;  Service: Gastroenterology;  Laterality: N/A;  . TUMOR EXCISION  1989   same time as hysterectomy    FAMILY HISTORY: Family History  Problem Relation Age of Onset  . Diabetes  Father 44       DM complications  . Hypertension Mother 48       CKD  . Thyroid disease Mother   . Heart failure Mother   . Kidney disease Mother   . Depression Mother   . Breast cancer Sister 15  . Depression Sister   . Kidney disease Other     . Breast cancer Maternal Aunt   . Breast cancer Cousin   . Anuerysm Son 26  . Diabetes Son     ADVANCED DIRECTIVES (Y/N):  N  HEALTH MAINTENANCE: Social History   Tobacco Use  . Smoking status: Never Smoker  . Smokeless tobacco: Never Used  . Tobacco comment: smoking cessation materials not required  Substance Use Topics  . Alcohol use: Never    Alcohol/week: 0.0 standard drinks  . Drug use: No     Colonoscopy:  PAP:  Bone density:  Lipid panel:  Allergies  Allergen Reactions  . Aspirin     Tinnitus  . Citalopram Other (See Comments)  . Codeine Nausea And Vomiting  . Penicillins Rash    Has patient had a PCN reaction causing immediate rash, facial/tongue/throat swelling, SOB or lightheadedness with hypotension: Yes Has patient had a PCN reaction causing severe rash involving mucus membranes or skin necrosis: No Has patient had a PCN reaction that required hospitalization No Has patient had a PCN reaction occurring within the last 10 years: No If all of the above answers are "NO", then may proceed with Cephalosporin use.    Current Outpatient Medications  Medication Sig Dispense Refill  . Ascorbic Acid (VITAMIN C) 1000 MG tablet Take 1,000 mg by mouth daily.    . clonazepam (KLONOPIN) 0.125 MG disintegrating tablet Take 1 tablet (0.125 mg total) by mouth 2 (two) times daily. 60 tablet 0  . Garlic 0960 MG CAPS Take 1 capsule by mouth daily.     . Iron-Vitamins (GERITOL COMPLETE PO) Take 1 tablet by mouth daily.    Marland Kitchen levothyroxine (SYNTHROID) 88 MCG tablet Take 1 tablet (88 mcg total) by mouth daily before breakfast. 90 tablet 0  . lidocaine (XYLOCAINE) 2 % solution Use as directed 15 mLs in the mouth or throat every 6 (six) hours as needed for mouth pain. 100 mL 0  . mirtazapine (REMERON) 15 MG tablet 7.5 mg at night for two weeks, then 15 mg at night 30 tablet 1  . omeprazole (PRILOSEC) 40 MG capsule Take 1 capsule (40 mg total) by mouth daily before breakfast. 90  capsule 3  . TURMERIC CURCUMIN PO Take 550 mg by mouth daily.     No current facility-administered medications for this visit.    OBJECTIVE: There were no vitals filed for this visit.   There is no height or weight on file to calculate BMI.    ECOG FS:1 - Symptomatic but completely ambulatory  General: Thin, no acute distress. HEENT: Normocephalic. Neuro: Alert, answering all questions appropriately. Cranial nerves grossly intact. Psych: Normal affect.   LAB RESULTS:  Lab Results  Component Value Date   NA 131 (L) 01/25/2020   K 4.4 01/25/2020   CL 89 (L) 01/25/2020   CO2 34 (H) 01/25/2020   GLUCOSE 112 (H) 01/25/2020   BUN 4 (L) 01/25/2020   CREATININE 0.66 01/25/2020   CALCIUM 9.7 01/25/2020   PROT 7.0 01/25/2020   ALBUMIN 4.0 11/19/2019   AST 24 01/25/2020   ALT 11 01/25/2020   ALKPHOS 46 11/19/2019   BILITOT 0.5 01/25/2020  GFRNONAA 87 01/25/2020   GFRAA 101 01/25/2020    Lab Results  Component Value Date   WBC 2.5 (L) 02/13/2020   NEUTROABS 0.8 (L) 02/13/2020   HGB 12.4 02/13/2020   HCT 35.5 (L) 02/13/2020   MCV 88.3 02/13/2020   PLT 179 02/13/2020     STUDIES: MM 3D SCREEN BREAST BILATERAL  Result Date: 02/15/2020 CLINICAL DATA:  Screening. EXAM: DIGITAL SCREENING BILATERAL MAMMOGRAM WITH TOMO AND CAD COMPARISON:  Previous exam(s). ACR Breast Density Category c: The breast tissue is heterogeneously dense, which may obscure small masses. FINDINGS: There are no findings suspicious for malignancy. Images were processed with CAD. IMPRESSION: No mammographic evidence of malignancy. A result letter of this screening mammogram will be mailed directly to the patient. RECOMMENDATION: Screening mammogram in one year. (Code:SM-B-01Y) BI-RADS CATEGORY  1: Negative. Electronically Signed   By: Lillia Mountain M.D.   On: 02/15/2020 15:12    ASSESSMENT: Neutropenia  PLAN:    1. Neutropenia: Patient's total white blood cell count and ANC remain decreased, but essentially  stable at 2.5.  Suspect this may be been nutritional given her poor p.o. intake.  All of her other laboratory work including folate, B12, iron stores, LDH, peripheral blood flow cytometry, and neutrophil antibodies are either negative or within normal limits. [facility] Myeloid panel is pending at time of dictation.  No intervention is needed at this time.  Patient does not require bone marrow biopsy.  Return to clinic in 4 months with repeat laboratory work and video assisted telemedicine visit.  If patient's white count remains stable and she continues to be asymptomatic, she likely can be discharged from clinic. 2.  Weight loss: Improved with improved caloric intake.  Patient had CT of the chest and neck in January 2021 that did not reveal any distinct pathology.  Patient has also had endoscopic evaluation by GI that was reported as unrevealing.  She has instructed to continue her follow-up with GI as scheduled.    I provided 20 minutes of face-to-face video visit time during this encounter which included chart review, counseling, and coordination of care as documented above.   Patient expressed understanding and was in agreement with this plan. She also understands that She can call clinic at any time with any questions, concerns, or complaints.    Lloyd Huger, MD   03/09/2020 6:19 AM

## 2020-03-13 ENCOUNTER — Telehealth: Payer: Self-pay | Admitting: Family Medicine

## 2020-03-13 NOTE — Telephone Encounter (Signed)
Spoke to son  Verner Chol and he stated that everything that has been recommended to her she is not compliant. He stated that she feels like she cannot swallow possible. He stated she is not giving no effort. All her labs are normal at Cancer center. He would like to come in with her for appointment on Thursday if possible to discuss

## 2020-03-13 NOTE — Telephone Encounter (Signed)
PT called w/ issues of the stomach/bowel constipation about a week off of and on PT stated that she wanted to speak w/ Dr. Carlynn Purl  Nurse. PT declined to make an appt. Please advise

## 2020-03-14 NOTE — Progress Notes (Signed)
Virtual Visit via Video Note  I connected with Maureen [name] on 03/20/20 at 11:00 AM EDT by a video enabled telemedicine application and verified that I am speaking with the correct person using two identifiers.   I discussed the limitations of evaluation and management by telemedicine and the availability of in person appointments. The patient expressed understanding and agreed to proceed.     I discussed the assessment and treatment plan with the patient. The patient was provided an opportunity to ask questions and all were answered. The patient agreed with the plan and demonstrated an understanding of the instructions.   The patient was advised to call back or seek an in-person evaluation if the symptoms worsen or if the condition fails to improve as anticipated.  I provided 12 minutes of non-face-to-face time during this encounter.  Location: patient- home, provider- home office   Maureen Clay, MD    [facility] MD/PA/NP OP Progress Note  03/20/2020 11:40 AM Maureen [name]  MRN:  [id number]  Chief Complaint:  Chief Complaint    Depression; Follow-up     HPI:  The patient checked in late for the appointment due to technical issues.  Maureen [name], her son presents to the interview with patient consent. He provides majority of the history.   She has not taken synthroid for while due to fear of choking. It was found out based on the lab she had several days ago. She was able to take this medication this morning, although it took time for her to do so. She drinks ensure up to 3 cans. She was able to eat little portion of tuna with baby food last week. She has not started mirtazapine. Maureen [name] and Maureen [name] was not aware of the medication being sent to the pharmacy, although it was discussed at the last visit. She sleeps well. She feels depressed. She has low motivation. She has not joined [employer] lately.  She denies SI. She feels anxious. She denies panic attacks. She takes clonazepam every  day. Maureen [name] states that [name] has hard time explaining herself.    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 Current mild episode of major depressive disorder without prior episode ([place])  F32.0   2. Anxiety disorder, unspecified type  F41.9     Past Psychiatric History: Please see initial evaluation for full details. I have reviewed the history. No updates at this time.     Past Medical History:  Past Medical History:  Diagnosis Date  . Allergy   . Corn of toe   . First degree AV block   . GERD (gastroesophageal reflux disease)   . Hammer toe of right foot   . Hyperglycemia   . Hyperlipidemia   . Hypertension   . Loss of appetite   . Prolapse of urethra   . Scoliosis (and kyphoscoliosis), idiopathic   . Thyroid disease   . Tinnitus of both ears   . Vitreous floaters of right eye     Past Surgical History:  Procedure Laterality Date  . ABDOMINAL HYSTERECTOMY  1989  . APPENDECTOMY  1989  . BREAST EXCISIONAL BIOPSY Bilateral    multiple biopsies negative  . BREAST SURGERY  Several  . COLONOSCOPY WITH PROPOFOL N/A 07/28/2019   Procedure: COLONOSCOPY WITH PROPOFOL;  Surgeon: Toney Reil, MD;  Location: [facility] ENDOSCOPY;  Service: Gastroenterology;  Laterality: N/A;  . ESOPHAGOGASTRODUODENOSCOPY (EGD) WITH PROPOFOL N/A 11/11/2019   Procedure: ESOPHAGOGASTRODUODENOSCOPY (EGD) WITH PROPOFOL;  Surgeon: Toney Reil, MD;  Location: [facility] ENDOSCOPY;  Service: Gastroenterology;  Laterality: N/A;  . TUMOR EXCISION  1989   same time as hysterectomy    Family Psychiatric History: Please see initial evaluation for full details. I have reviewed the history. No updates at this time.     Family  History:  Family History  Problem Relation Age of Onset  . Diabetes Father 9       DM complications  . Hypertension Mother 57       CKD  . Thyroid disease Mother   . Heart failure Mother   . Kidney disease Mother   . Depression Mother   . Breast cancer Sister 24  . Depression Sister   . Kidney disease Other   . Breast cancer Maternal Aunt   . Breast cancer Cousin   . Anuerysm Son 51  . Diabetes Son     Social History:  Social History   Socioeconomic History  . Marital status: Widowed    Spouse name: Not on file  . Number of children: 3  . Years of education: 27  . Highest education level: 12th grade  Occupational History  . Occupation: Retired  Tobacco Use  . Smoking status: Never Smoker  . Smokeless tobacco: Never Used  . Tobacco comment: smoking cessation materials not required  Substance and Sexual Activity  . Alcohol use: Never    Alcohol/week: 0.0 standard drinks  . Drug use: No  . Sexual activity: Not Currently    Partners: Male  Other Topics Concern  . Not on file  Social History Narrative  . Not on file   Social Determinants of Health   Financial Resource Strain:   . Difficulty of Paying Living Expenses:   Food Insecurity:   . Worried About Programme researcher, broadcasting/film/video in the Last Year:   . Barista in the Last Year:   Transportation Needs:   . Freight forwarder (Medical):   Marland Kitchen Lack of Transportation (Non-Medical):   Physical Activity: Unknown  . Days of Exercise per Week: 3 days  . Minutes of Exercise per Session: Not on file  Stress:   . Feeling of Stress :   Social Connections:   . Frequency of Communication with Friends and Family:   . Frequency of Social Gatherings with Friends and Family:   . Attends Religious Services:   . Active Member of Clubs or Organizations:   . Attends Banker Meetings:   Marland Kitchen Marital Status:     Allergies:  Allergies  Allergen Reactions  . Aspirin     Tinnitus  . Citalopram Other (See  Comments)  . Codeine Nausea And Vomiting  . Penicillins Rash    Has patient had a PCN reaction causing immediate rash, facial/tongue/throat swelling, SOB or lightheadedness with hypotension: Yes Has patient had a PCN reaction causing severe rash involving mucus membranes or skin necrosis: No Has patient had a PCN reaction that required hospitalization No Has patient had a PCN reaction occurring within the last 10 years: No If all of the above answers are "NO", then may proceed with Cephalosporin use.    Metabolic Disorder Labs: Lab Results  Component Value Date   HGBA1C  5.9 (H) 06/17/2019   MPG 123 06/17/2019   MPG 126 09/30/2018   No results found for: PROLACTIN Lab Results  Component Value Date   CHOL 179 06/17/2019   TRIG 64 06/17/2019   HDL 45 (L) 06/17/2019   CHOLHDL 4.0 06/17/2019   VLDL 17 01/20/2017   LDLCALC 118 (H) 06/17/2019   LDLCALC 136 (H) 01/08/2018   Lab Results  Component Value Date   TSH 12.52 (H) 03/15/2020   TSH 16.74 (H) 01/25/2020    Therapeutic Level Labs: No results found for: LITHIUM No results found for: VALPROATE No components found for:  CBMZ  Current Medications: Current Outpatient Medications  Medication Sig Dispense Refill  . Ascorbic Acid (VITAMIN C) 1000 MG tablet Take 1,000 mg by mouth daily.    . clonazepam (KLONOPIN) 0.125 MG disintegrating tablet Take 1 tablet (0.125 mg total) by mouth 2 (two) times daily. 60 tablet 0  . Garlic 1000 MG CAPS Take 1 capsule by mouth daily.     . Iron-Vitamins (GERITOL COMPLETE PO) Take 1 tablet by mouth daily.    Marland Kitchen levothyroxine (SYNTHROID) 100 MCG tablet Take 1 tablet (100 mcg total) by mouth daily before breakfast. New dose 90 tablet 0  . lidocaine (XYLOCAINE) 2 % solution Use as directed 15 mLs in the mouth or throat every 6 (six) hours as needed for mouth pain. 100 mL 0  . mirtazapine (REMERON) 15 MG tablet 7.5 mg at night for two weeks, then 15 mg at night 30 tablet 1  . omeprazole (PRILOSEC) 40  MG capsule Take 1 capsule (40 mg total) by mouth daily before breakfast. 90 capsule 3  . TURMERIC CURCUMIN PO Take 550 mg by mouth daily.     No current facility-administered medications for this visit.     Musculoskeletal: Strength & Muscle Tone: N/A Gait & Station: N/A Patient leans: N/A  Psychiatric Specialty Exam: Review of Systems  Psychiatric/Behavioral: Positive for decreased concentration, dysphoric mood and sleep disturbance. Negative for agitation, behavioral problems, confusion, hallucinations, self-injury and suicidal ideas. The patient is nervous/anxious. The patient is not hyperactive.   All other systems reviewed and are negative.   There were no vitals taken for this visit.There is no height or weight on file to calculate BMI.  General Appearance: Fairly Groomed  Eye Contact:  Good  Speech:  Clear and Coherent  Volume:  Normal  Mood:  Depressed  Affect:  Appropriate, Congruent and Restricted  Thought Process:  Coherent  Orientation:  Full (Time, Place, and Person)  Thought Content: Logical   Suicidal Thoughts:  No  Homicidal Thoughts:  No  Memory:  Immediate;   Good  Judgement:  Good  Insight:  Present  Psychomotor Activity:  Normal  Concentration:  Concentration: Good and Attention Span: Good  Recall:  Good  Fund of Knowledge: Good  Language: Good  Akathisia:  No  Handed:  Right  AIMS (if indicated): not done  Assets:  Communication Skills Desire for Improvement  ADL's:  Intact  Cognition: WNL  Sleep:  Good   Screenings: GAD-7     Office Visit from 12/22/2019 in [facility] Office Visit from 11/30/2019 in [facility] Office Visit from 01/11/2019 in [facility] Office Visit from 09/30/2018 in [facility] Office Visit from 06/02/2016 in [facility]  Total GAD-7 Score  12  7  3  2  16     PHQ2-9     Office Visit from 03/15/2020  in [place]  Office Visit from 01/20/2020 in [facility] Office Visit from 12/22/2019 in [facility] Office Visit from 11/30/2019 in [facility] Office Visit from 10/20/2019 in [facility]  PHQ-2 Total Score  3  3  4  3  1   PHQ-9 Total Score  13  11  15  12  2        Assessment and Plan:  SHERON TALLMAN is a 74 y.o. year old female with a history of depression,malnutrition, hypothyroidism, , who presents for follow up appointment for below.   1. Current mild episode of major depressive disorder without prior episode ([facility]) 2. Anxiety disorder, unspecified type She continues to report depressive symptoms with anxiety, fear of choking since the last visit, and has had weight loss in the context of non adherence to levothyroxine.  She has not started mirtazapine which was discussed at the previous visit. The patient and her son were both informed to start this medication to target depression, anxiety and appetite loss.  Discussed the importance of adherence to medication.  Will continue clonazepam as needed for anxiety, which has been prescribed by PCP.  Although this medication would be preferred to avoid risk of fall, her son reports great benefit from this medication. Although she will greatly benefit from CBT, no therapist is currently available at [facility]. Will consider referral to outside if the patient is amenable. Noted that she has been nonadherent to levothyroxine, although she is now agreeable to reinitiate this medication.   # r/o mild neurocognitive disorder Although she is alert and oriented during the interview, her son reports a few episodes of confusion in the past.  ADL independent, and no safety concern.  Will continue to assess.   Plan I have reviewed and updated plans as below 1. Start mirtazapine 7.5 mg at night for two weeks, then 15 mg at night 2. Continue clonazepam 0.125 mg twice a day as needed for anxiety 3.  Next appointment: 7/8 at 11 AM for 30 mins, video - She has an appointment with speech therapy at [facility]  The patient demonstrates the following risk factors for suicide: Chronic risk factors for suicide include: N/A. Acute risk factors for suicide include: unemployment. Protective factors for this patient include: positive social support. Considering these factors, the overall suicide risk at this point appears to be low. Patient is appropriate for outpatient follow up.   [facility], MD 03/20/2020, 11:40 AM

## 2020-03-14 NOTE — Telephone Encounter (Signed)
Please put her in for appointment and notify son

## 2020-03-15 ENCOUNTER — Ambulatory Visit (INDEPENDENT_AMBULATORY_CARE_PROVIDER_SITE_OTHER): Payer: Medicare HMO | Admitting: Family Medicine

## 2020-03-15 ENCOUNTER — Other Ambulatory Visit: Payer: Self-pay

## 2020-03-15 ENCOUNTER — Encounter: Payer: Self-pay | Admitting: Family Medicine

## 2020-03-15 VITALS — BP 106/78 | HR 87 | Temp 96.9°F | Resp 16 | Ht 60.0 in | Wt 91.2 lb

## 2020-03-15 DIAGNOSIS — F320 Major depressive disorder, single episode, mild: Secondary | ICD-10-CM

## 2020-03-15 DIAGNOSIS — D708 Other neutropenia: Secondary | ICD-10-CM | POA: Diagnosis not present

## 2020-03-15 DIAGNOSIS — E440 Moderate protein-calorie malnutrition: Secondary | ICD-10-CM

## 2020-03-15 DIAGNOSIS — E039 Hypothyroidism, unspecified: Secondary | ICD-10-CM | POA: Diagnosis not present

## 2020-03-15 DIAGNOSIS — R1310 Dysphagia, unspecified: Secondary | ICD-10-CM | POA: Diagnosis not present

## 2020-03-15 NOTE — Patient Instructions (Addendum)
Referral has been sent to [facility]  P: [phone] F: [phone]  Release ID # [id number]        High-Protein and High-Calorie Diet Eating high-protein and high-calorie foods can help you to gain weight, heal after an injury, and recover after an illness or surgery. The specific amount of daily protein and calories you need depends on:  Your body weight.  The reason this diet is recommended for you. What is my plan? Generally, a high-protein, high-calorie diet involves:  Eating 250-500 extra calories each day.  Making sure that you get enough of your daily calories from protein. Ask your health care provider how many of your calories should come from protein. Talk with a health care provider, such as a diet and nutrition specialist (dietitian), about how much protein and how many calories you need each day. Follow the diet as directed by your health care provider. What are tips for following this plan?  Preparing meals  Add whole milk, half-and-half, or heavy cream to cereal, pudding, soup, or hot cocoa.  Add whole milk to instant breakfast drinks.  Add peanut butter to oatmeal or smoothies.  Add powdered milk to baked goods, smoothies, or milkshakes.  Add powdered milk, cream, or butter to mashed potatoes.  Add cheese to cooked vegetables.  Make whole-milk yogurt parfaits. Top them with granola, fruit, or nuts.  Add cottage cheese to your fruit.  Add avocado, cheese, or both to sandwiches or salads.  Add meat, poultry, or seafood to rice, pasta, casseroles, salads, and soups.  Use mayonnaise when making egg salad, chicken salad, or tuna salad.  Use peanut butter as a dip for vegetables or as a topping for pretzels, celery, or crackers.  Add beans to casseroles, dips, and spreads.  Add pureed beans to sauces and soups.  Replace calorie-free drinks with calorie-containing drinks, such as milk and fruit juice.  Replace water  with milk or heavy cream when making foods such as oatmeal, pudding, or cocoa. General instructions  Ask your health care provider if you should take a nutritional supplement.  Try to eat six small meals each day instead of three large meals.  Eat a balanced diet. In each meal, include one food that is high in protein.  Keep nutritious snacks available, such as nuts, trail mixes, dried fruit, and yogurt.  If you have kidney disease or diabetes, talk with your health care provider about how much protein is safe for you. Too much protein may put extra stress on your kidneys.  Drink your calories. Choose high-calorie drinks and have them after your meals. What high-protein foods should I eat?  Vegetables Soybeans. Peas. Grains Quinoa. Bulgur wheat. Meats and other proteins Beef, pork, and poultry. Fish and seafood. Eggs. Tofu. Textured vegetable protein (TVP). Peanut butter. Nuts and seeds. Dried beans. Protein powders. Dairy Whole milk. Whole-milk yogurt. Powdered milk. Cheese. [employer]. Eggnog. Beverages High-protein supplement drinks. Soy milk. Other foods Protein bars. The items listed above may not be a complete list of high-protein foods and beverages. Contact a dietitian for more options. What high-calorie foods should I eat? Fruits Dried fruit. Fruit leather. Canned fruit in syrup. Fruit juice. Avocado. Vegetables Vegetables cooked in oil or butter. Fried potatoes. Grains Pasta. Quick breads. Muffins. Pancakes. Ready-to-eat cereal. Meats and other proteins Peanut butter. Nuts and seeds. Dairy Heavy cream. Whipped cream. Cream cheese. Sour cream. Ice cream. Custard. Pudding. Beverages Meal-replacement beverages. Nutrition shakes. Fruit juice. Sugar-sweetened soft drinks. Seasonings and condiments  Salad dressing. Mayonnaise. Alfredo sauce. Fruit preserves or jelly. Honey. Syrup. Sweets and desserts Cake. Cookies. Pie. Pastries. Candy bars. Chocolate. Fats and  oils Butter or margarine. Oil. Gravy. Other foods Meal-replacement bars. The items listed above may not be a complete list of high-calorie foods and beverages. Contact a dietitian for more options. Summary  A high-protein, high-calorie diet can help you gain weight or heal faster after an injury, illness, or surgery.  To increase your protein and calories, add ingredients such as whole milk, peanut butter, cheese, beans, meat, or seafood to meal items.  To get enough extra calories each day, include high-calorie foods and beverages at each meal.  Adding a high-calorie drink or shake can be an easy way to help you get enough calories each day. Talk with your healthcare provider or dietitian about the best options for you. This information is not intended to replace advice given to you by your health care provider. Make sure you discuss any questions you have with your health care provider. Document Revised: 09/25/2017 Document Reviewed: 08/25/2017 Elsevier Patient Education  2020 [employer].

## 2020-03-15 NOTE — Progress Notes (Signed)
Name: Maureen [name]   MRN: [id number]    DOB: [birth year]   Date:03/15/2020       Progress Note  Subjective  Chief Complaint  Chief Complaint  Patient presents with  . Abdominal Pain  . Constipation    HPI  Dysphagia/Malnutrition : her son Maureen [name] called a couple of days ago stating she continues to refuse oral intake. Patient has lost weight since her in person visit in Feb, weight was 106 lbs and is down to 91 lbs today. Since last visit she was seen by Dr. Grayland Ormond for neutropenia and evaluation so far has been negative.  She had an appointment with psychiatrist Dr. Modesta Messing on  02/02/2020 and advised to start Mirtazapine bid and continue clonazepam prn anxiety , she missed follow up visit in May but will re-schedule it Last visit with Dr. Marius Ditch was 01/02/2020 advised to stay on Omeprazole daily also was advised to have  esophageal manometry at [facility] but son came in today and not sure about that appointment  She never had OT either - son states too many appointments and having difficulty keeping up with all the visits.  EGD non obstructing Schatzki's ring   She has three children, two sons and one daughter, but Maureen [name] is the primary caregiver.   Hypothyroidism: she states she is taking medication daily with apple sauce. Losing weight. She has chronic dry skin. Last TSH was elevated, we will recheck TSH today   MDD: long history of depression, taking klonopin for anxiety and initially it was helping her eat  ( started during [facility] visit ) but per patient and son it does not seem to help as much now, she is taking Remeron given by Dr. Shella Spearing and she states not sure if it works, ask for higher dose, advised to discuss it with psychiatrist .   Leucopenia: seen by Dr. Grayland Ormond, and evaluation negative so far.    Patient Active Problem List   Diagnosis Date Noted  . Vitamin D deficiency 03/01/2020  . Esophageal dysphagia   . Arrhythmia 08/25/2019  . Rectal bleeding   . Low grade  squamous intraepith lesion on cytologic smear cervix (lgsil) 08/04/2018  . Chronic venous insufficiency 07/05/2018  . Lymphedema 07/05/2018  . Hypertension 04/02/2018  . Swelling of limb 04/02/2018  . Depression, major, recurrent, mild ([place]) 12/29/2017  . Leukopenia 05/24/2017  . Allergic rhinitis, seasonal 06/29/2015  . Clavus 06/29/2015  . 1st degree AV block 06/29/2015  . Hammer toe 06/29/2015  . H/O: HTN (hypertension) 06/29/2015  . Blood glucose elevated 06/29/2015  . Floater, vitreous 06/29/2015  . Bilateral tinnitus 06/29/2015  . Hypothyroidism (acquired) 04/02/2015  . Gastroesophageal reflux disease 04/02/2015  . Hammer toe of right foot 04/02/2015  . Scoliosis of thoracic spine 04/02/2015  . Urethral prolapse 04/02/2015  . Corn of toe 04/02/2015  . Hyperlipidemia 04/02/2015  . Varicose veins of both lower extremities 04/02/2015    Past Surgical History:  Procedure Laterality Date  . ABDOMINAL HYSTERECTOMY  1989  . APPENDECTOMY  1989  . BREAST EXCISIONAL BIOPSY Bilateral    multiple biopsies negative  . BREAST SURGERY     Several  . COLONOSCOPY WITH PROPOFOL N/A 07/28/2019   Procedure: COLONOSCOPY WITH PROPOFOL;  Surgeon: Lin Landsman, MD;  Location: [facility] ENDOSCOPY;  Service: Gastroenterology;  Laterality: N/A;  . ESOPHAGOGASTRODUODENOSCOPY (EGD) WITH PROPOFOL N/A 11/11/2019   Procedure: ESOPHAGOGASTRODUODENOSCOPY (EGD) WITH PROPOFOL;  Surgeon: Lin Landsman, MD;  Location[facility] ENDOSCOPY;  Service: Gastroenterology;  Laterality: N/A;  .  TUMOR EXCISION  1989   same time as hysterectomy    Family History  Problem Relation Age of Onset  . Diabetes Father 23       DM complications  . Hypertension Mother 20       CKD  . Thyroid disease Mother   . Heart failure Mother   . Kidney disease Mother   . Depression Mother   . Breast cancer Sister 40  . Depression Sister   . Kidney disease Other   . Breast cancer Maternal Aunt   . Breast cancer Cousin   .  Anuerysm Son 47  . Diabetes Son     Social History   Tobacco Use  . Smoking status: Never Smoker  . Smokeless tobacco: Never Used  . Tobacco comment: smoking cessation materials not required  Substance Use Topics  . Alcohol use: Never    Alcohol/week: 0.0 standard drinks     Current Outpatient Medications:  .  Ascorbic Acid (VITAMIN C) 1000 MG tablet, Take 1,000 mg by mouth daily., Disp: , Rfl:  .  clonazepam (KLONOPIN) 0.125 MG disintegrating tablet, Take 1 tablet (0.125 mg total) by mouth 2 (two) times daily., Disp: 60 tablet, Rfl: 0 .  Garlic 3875 MG CAPS, Take 1 capsule by mouth daily. , Disp: , Rfl:  .  levothyroxine (SYNTHROID) 88 MCG tablet, Take 1 tablet (88 mcg total) by mouth daily before breakfast., Disp: 90 tablet, Rfl: 0 .  omeprazole (PRILOSEC) 40 MG capsule, Take 1 capsule (40 mg total) by mouth daily before breakfast., Disp: 90 capsule, Rfl: 3 .  TURMERIC CURCUMIN PO, Take 550 mg by mouth daily., Disp: , Rfl:  .  Iron-Vitamins (GERITOL COMPLETE PO), Take 1 tablet by mouth daily., Disp: , Rfl:  .  lidocaine (XYLOCAINE) 2 % solution, Use as directed 15 mLs in the mouth or throat every 6 (six) hours as needed for mouth pain., Disp: 100 mL, Rfl: 0 .  mirtazapine (REMERON) 15 MG tablet, 7.5 mg at night for two weeks, then 15 mg at night, Disp: 30 tablet, Rfl: 1  Allergies  Allergen Reactions  . Aspirin     Tinnitus  . Citalopram Other (See Comments)  . Codeine Nausea And Vomiting  . Penicillins Rash    Has patient had a PCN reaction causing immediate rash, facial/tongue/throat swelling, SOB or lightheadedness with hypotension: Yes Has patient had a PCN reaction causing severe rash involving mucus membranes or skin necrosis: No Has patient had a PCN reaction that required hospitalization No Has patient had a PCN reaction occurring within the last 10 years: No If all of the above answers are "NO", then may proceed with Cephalosporin use.    I personally reviewed  active problem list, medication list, allergies, family history, social history, health maintenance with the patient/caregiver today.   ROS  Constitutional: Negative for fever , positive for weight change - loss.  Respiratory: Negative for cough and shortness of breath.   Cardiovascular: Negative for chest pain or palpitations.  Gastrointestinal: Negative for abdominal pain, she has noticed decrease in  bowel movements, but not eating much  Musculoskeletal: Negative for gait problem or joint swelling.  Skin: Negative for rash.  Neurological: Negative for dizziness or headache.  No other specific complaints in a complete review of systems (except as listed in HPI above).  Objective  Vitals:   03/15/20 0923  BP: 106/78  Pulse: 87  Resp: 16  Temp: (!) 96.9 F (36.1 C)  TempSrc: Temporal  SpO2:  100%  Weight: 91 lb 3.2 oz (41.4 kg)  Height: 5' (1.524 m)    Body mass index is 17.81 kg/m.  Physical Exam  Constitutional: Patient appears malnourished  No distress.  HEENT: head atraumatic, normocephalic, pupils equal and reactive to light,  neck supple Cardiovascular: Normal rate, regular rhythm and normal heart sounds.  No murmur heard. No BLE edema. Pulmonary/Chest: Effort normal and breath sounds normal. No respiratory distress. Abdominal: Soft.  There is no tenderness. Psychiatric: Patient has a normal mood and affect. behavior is normal. Judgment and thought content normal.  Recent Results (from the past 2160 hour(s))  CBC with Differential     Status: Abnormal   Collection Time: 12/16/19  6:22 PM  Result Value Ref Range   WBC 3.4 (L) 4.0 - 10.5 K/uL   RBC 4.21 3.87 - 5.11 MIL/uL   Hemoglobin 12.6 12.0 - 15.0 g/dL   HCT 36.4 36.0 - 46.0 %   MCV 86.5 80.0 - 100.0 fL   MCH 29.9 26.0 - 34.0 pg   MCHC 34.6 30.0 - 36.0 g/dL   RDW 14.2 11.5 - 15.5 %   Platelets 184 150 - 400 K/uL   nRBC 0.0 0.0 - 0.2 %   Neutrophils Relative % 39 %   Neutro Abs 1.3 (L) 1.7 - 7.7 K/uL    Lymphocytes Relative 45 %   Lymphs Abs 1.5 0.7 - 4.0 K/uL   Monocytes Relative 14 %   Monocytes Absolute 0.5 0.1 - 1.0 K/uL   Eosinophils Relative 1 %   Eosinophils Absolute 0.0 0.0 - 0.5 K/uL   Basophils Relative 1 %   Basophils Absolute 0.0 0.0 - 0.1 K/uL   Immature Granulocytes 0 %   Abs Immature Granulocytes 0.01 0.00 - 0.07 K/uL    Comment: Performed at [facility], [address] [place], [place] [address]  Basic metabolic panel     Status: Abnormal   Collection Time: 12/16/19  6:22 PM  Result Value Ref Range   Sodium 127 (L) 135 - 145 mmol/L   Potassium 3.7 3.5 - 5.1 mmol/L   Chloride 86 (L) 98 - 111 mmol/L   CO2 24 22 - 32 mmol/L   Glucose, Bld 70 70 - 99 mg/dL   BUN 6 (L) 8 - 23 mg/dL   Creatinine, Ser 0.71 0.44 - 1.00 mg/dL   Calcium 9.0 8.9 - 10.3 mg/dL   GFR calc non Af Amer >60 >60 mL/min   GFR calc Af Amer >60 >60 mL/min   Anion gap 17 (H) 5 - 15    Comment: Performed at [facility], [address] [place], [place] [address]  Troponin I (High Sensitivity)     Status: None   Collection Time: 12/16/19  6:22 PM  Result Value Ref Range   Troponin I (High Sensitivity) 5 <18 ng/L    Comment: (NOTE) Elevated high sensitivity troponin I (hsTnI) values and significant  changes across serial measurements may suggest ACS but many other  chronic and acute conditions are known to elevate hsTnI results.  Refer to the "Links" section for chest pain algorithms and additional  guidance. Performed at [facility], [address] [place], [place] [address]   CBC with Differential/Platelet     Status: Abnormal   Collection Time: 01/25/20 11:24 AM  Result Value Ref Range   WBC 2.5 (L) 3.8 - 10.8 Thousand/uL   RBC 4.11 3.80 - 5.10 Million/uL   Hemoglobin 12.6 11.7 - 15.5 g/dL  HCT 36.8 35.0 - 45.0 %   MCV 89.5 80.0 - 100.0 fL   MCH 30.7 27.0 - 33.0 pg   MCHC 34.2 32.0 - 36.0 g/dL   RDW 13.8 11.0 - 15.0 %   Platelets 186 140 - 400  Thousand/uL   MPV 11.5 7.5 - 12.5 fL   Neutro Abs 1,053 (L) 1,500 - 7,800 cells/uL   Lymphs Abs 1,010 850 - 3,900 cells/uL   Absolute Monocytes 378 200 - 950 cells/uL   Eosinophils Absolute 30 15 - 500 cells/uL   Basophils Absolute 30 0 - 200 cells/uL   Neutrophils Relative % 42.1 %   Total Lymphocyte 40.4 %   Monocytes Relative 15.1 %   Eosinophils Relative 1.2 %   Basophils Relative 1.2 %  COMPLETE METABOLIC PANEL WITH GFR     Status: Abnormal   Collection Time: 01/25/20 11:24 AM  Result Value Ref Range   Glucose, Bld 112 (H) 65 - 99 mg/dL    Comment: .            Fasting reference interval . For someone without known diabetes, a glucose value between 100 and 125 mg/dL is consistent with prediabetes and should be confirmed with a follow-up test. .    BUN 4 (L) 7 - 25 mg/dL   Creat 0.66 0.60 - 0.93 mg/dL    Comment: For patients >50 years of age, the reference limit for Creatinine is approximately 13% higher for people identified as African-American. .    GFR, Est Non African American 87 > OR = 60 mL/min/1.2m   GFR, Est African American 101 > OR = 60 mL/min/1.766m  BUN/Creatinine Ratio 6 6 - 22 (calc)   Sodium 131 (L) 135 - 146 mmol/L   Potassium 4.4 3.5 - 5.3 mmol/L   Chloride 89 (L) 98 - 110 mmol/L   CO2 34 (H) 20 - 32 mmol/L   Calcium 9.7 8.6 - 10.4 mg/dL   Total Protein 7.0 6.1 - 8.1 g/dL   Albumin 4.0 3.6 - 5.1 g/dL   Globulin 3.0 1.9 - 3.7 g/dL (calc)   AG Ratio 1.3 1.0 - 2.5 (calc)   Total Bilirubin 0.5 0.2 - 1.2 mg/dL   Alkaline phosphatase (APISO) 43 37 - 153 U/L   AST 24 10 - 35 U/L   ALT 11 6 - 29 U/L  Vitamin B12     Status: Abnormal   Collection Time: 01/25/20 11:24 AM  Result Value Ref Range   Vitamin B-12 1,254 (H) 200 - 1,100 pg/mL  VITAMIN D 25 Hydroxy (Vit-D Deficiency, Fractures)     Status: Abnormal   Collection Time: 01/25/20 11:24 AM  Result Value Ref Range   Vit D, 25-Hydroxy 16 (L) 30 - 100 ng/mL    Comment: Vitamin D Status          25-OH Vitamin D: . Deficiency:                    <20 ng/mL Insufficiency:             20 - 29 ng/mL Optimal:                 > or = 30 ng/mL . For 25-OH Vitamin D testing on patients on  D2-supplementation and patients for whom quantitation  of D2 and D3 fractions is required, the [facility]ure[facility](TM) 25-OH VIT D, (D2,D3), LC/MS/MS is recommended: order  code [id number] >2y71yr See Note 1 . Note 1 . For additional  information, please refer to  http://education.[facility].com/faq/FAQ199  (This link is being provided for informational/ educational purposes only.)   TSH     Status: Abnormal   Collection Time: 01/25/20 11:24 AM  Result Value Ref Range   TSH 16.74 (H) 0.40 - 4.50 mIU/L  Folate     Status: None   Collection Time: 02/13/20  3:30 PM  Result Value Ref Range   Folate 13.5 >5.9 ng/mL    Comment: Performed at [facility], [address] [place], [place] [address]  Iron and TIBC     Status: None   Collection Time: 02/13/20  3:30 PM  Result Value Ref Range   Iron 53 28 - 170 ug/dL   TIBC 288 250 - 450 ug/dL   Saturation Ratios 18 10.4 - 31.8 %   UIBC 235 ug/dL    Comment: Performed at [facility], [address] [place], [place] [address]  Ferritin     Status: None   Collection Time: 02/13/20  3:30 PM  Result Value Ref Range   Ferritin 201 11 - 307 ng/mL    Comment: Performed at [facility], [address] [place], [place] [address]  NEUTROPHIL AB TEST LEVEL 1     Status: None   Collection Time: 02/13/20  3:30 PM  Result Value Ref Range   NEUTROPHIL SCR/PANEL INTERP. Negative     Comment: (NOTE) Performed At: [facility] [place], [place] [id number] Rush Farmer MD [id number]   Lactate dehydrogenase     Status: None   Collection Time: 02/13/20  3:30 PM  Result Value Ref Range   LDH 157 98 - 192 U/L    Comment: Performed at [facility], [address] [place], [place] [address]   CBC with Differential/Platelet     Status: Abnormal   Collection Time: 02/13/20  3:30 PM  Result Value Ref Range   WBC 2.5 (L) 4.0 - 10.5 K/uL   RBC 4.02 3.87 - 5.11 MIL/uL   Hemoglobin 12.4 12.0 - 15.0 g/dL   HCT 35.5 (L) 36.0 - 46.0 %   MCV 88.3 80.0 - 100.0 fL   MCH 30.8 26.0 - 34.0 pg   MCHC 34.9 30.0 - 36.0 g/dL   RDW 14.3 11.5 - 15.5 %   Platelets 179 150 - 400 K/uL   nRBC 0.0 0.0 - 0.2 %   Neutrophils Relative % 33 %   Neutro Abs 0.8 (L) 1.7 - 7.7 K/uL   Lymphocytes Relative 50 %   Lymphs Abs 1.2 0.7 - 4.0 K/uL   Monocytes Relative 14 %   Monocytes Absolute 0.4 0.1 - 1.0 K/uL   Eosinophils Relative 2 %   Eosinophils Absolute 0.0 0.0 - 0.5 K/uL   Basophils Relative 1 %   Basophils Absolute 0.0 0.0 - 0.1 K/uL   Immature Granulocytes 0 %   Abs Immature Granulocytes 0.00 0.00 - 0.07 K/uL    Comment: Performed at [facility], [address] [place], [place] [address]  Flow cytometry panel-leukemia/lymphoma work-up     Status: None   Collection Time: 02/13/20  3:30 PM  Result Value Ref Range   PATH INTERP XXX-IMP Comment     Comment: No significant immunophenotypic abnormality detected   CLINICAL INFO Comment     Comment: (NOTE) Accompanying CBC dated 02-13-20 shows: WBC count 2.5, Neu 0.8, Lym 1.2, Mon 0.4.    Specimen Type Comment     Comment: Peripheral blood   ASSESSMENT OF  LEUKOCYTES Comment     Comment: (NOTE) No monoclonal B cell population is detected. kappa:lambda ratio 2.0 There is no loss of, or aberrant expression of, the pan T cell antigens to suggest a neoplastic T cell process. CD4:CD8 ratio 1.5 CD57 positive cells are increased but are composed of a mixture of CD4 and CD8 positive T cells and NK cells, most consistent with a reactive process. No circulating blasts are detected. There is no immunophenotypic  evidence of abnormal myeloid maturation. Analysis of the leukocyte population shows: granulocytes 34%, monocytes 7%, lymphocytes  59%, blasts <0.1%, B cells 12%, T cells 29%, LGLs 18%, NK cells 19%.    % Viable Cells Comment     Comment: 96%   ANALYSIS AND GATING STRATEGY Comment     Comment: 8 color analysis with CD45/SSC   IMMUNOPHENOTYPING STUDY Comment     Comment: (NOTE) CD2       Normal         CD3       Normal CD4       Normal         CD5       Normal CD7       Normal         CD8       Normal CD10      Normal         CD11b     Normal CD13      Normal         CD14      Normal CD16      Normal         CD19      Normal CD20      Normal         CD33      Normal CD34      Normal         CD38      Normal CD45      Normal         CD56      Normal CD57      Normal         CD117     Normal HLA-DR    Normal         KAPPA     Normal LAMBDA    Normal         CD64      Normal    PATHOLOGIST NAME Comment     Comment: Henrietta Hoover, M.D.   COMMENT: Comment     Comment: (NOTE) Each antibody in this assay was utilized to assess for potential abnormalities of studied cell populations or to characterize identified abnormalities. This test was developed and its performance characteristics determined by [facility].  It has not been cleared or approved by the U.S. Food and Drug Administration. The FDA has determined that such clearance or approval is not necessary. This test is used for clinical purposes.  It should not be regarded as investigational or for research. Performed At: -[facility] [address] [place], [place] [id number] Katina Degree MDPhD [id number] Performed At: [facility] [address] [place], [place] [id number] Katina Degree MDPhD [id number]     PHQ2/9: Depression screen [facility] 2/9 03/15/2020 01/20/2020 12/22/2019 11/30/2019 10/20/2019  Decreased Interest _0 0  Down, Depressed, Hopeless _1 PHQ - 2 Score _2 Altered sleeping 2 0 0 1 0  Tired,  decreased energy _0 0  Change in appetite _1 Feeling bad or failure about yourself  _2 0  Trouble  concentrating 1 0 0 2 0  Moving slowly or fidgety/restless 2 0 3 1 0  Suicidal thoughts 0 1 0 0 0  PHQ-9 Score _3 Difficult doing work/chores Somewhat difficult Very difficult Somewhat difficult - Not difficult at all  Some recent data might be hidden    phq 9 is positive   Fall Risk: Fall Risk  03/15/2020 11/30/2019 10/20/2019 09/28/2019 09/09/2019  Falls in the past year? 0 0 0 0 0  Number falls in past yr: 0 0 0 0 0  Injury with Fall? 0 0 - 0 0  Risk for fall due to : - - - - -  Risk for fall due to: Comment - - - - -  Follow up - - Falls evaluation completed - Falls evaluation completed    Functional Status Survey: Is the patient deaf or have difficulty hearing?: No Does the patient have difficulty seeing, even when wearing glasses/contacts?: No Does the patient have difficulty concentrating, remembering, or making decisions?: No Does the patient have difficulty walking or climbing stairs?: No Does the patient have difficulty dressing or bathing?: No Does the patient have difficulty doing errands alone such as visiting a doctor's office or shopping?: Yes    Assessment & Plan  1. Moderate malnutrition ([place])  Losing more weight, discussed high calorie diet   2. Mild major depression ([place])  Needs to follow up with psychiatrist and continue medication  3. Dysphagia, unspecified type  Reached out to Dr. Marius Ditch to find out about about the esophageal manometry and also PT evaluation, also gave son the number of OT from [facility] since we placed referral but she has not heard from them yet   4. Hypothyroidism (acquired)  - TSH  5. Other neutropenia ([place])  Evaluation so far negative by Dr. Grayland Ormond

## 2020-03-16 LAB — TSH: TSH: 12.52 mIU/L — ABNORMAL HIGH (ref 0.40–4.50)

## 2020-03-19 ENCOUNTER — Other Ambulatory Visit: Payer: Self-pay | Admitting: Family Medicine

## 2020-03-19 DIAGNOSIS — E039 Hypothyroidism, unspecified: Secondary | ICD-10-CM

## 2020-03-19 MED ORDER — LEVOTHYROXINE SODIUM 100 MCG PO TABS: 100.0000 ug | ORAL_TABLET | Freq: Every day | ORAL | 0 refills | Status: DC

## 2020-03-20 ENCOUNTER — Telehealth (INDEPENDENT_AMBULATORY_CARE_PROVIDER_SITE_OTHER): Payer: Medicare HMO | Admitting: Psychiatry

## 2020-03-20 ENCOUNTER — Encounter (HOSPITAL_COMMUNITY): Payer: Self-pay | Admitting: Psychiatry

## 2020-03-20 ENCOUNTER — Other Ambulatory Visit: Payer: Self-pay

## 2020-03-20 DIAGNOSIS — F320 Major depressive disorder, single episode, mild: Secondary | ICD-10-CM

## 2020-03-20 DIAGNOSIS — F419 Anxiety disorder, unspecified: Secondary | ICD-10-CM

## 2020-03-20 NOTE — Patient Instructions (Signed)
1. Start mirtazapine 7.5 mg at night for two weeks, then 15 mg at night 2. Continue clonazepam 0.125 mg twice a day as needed for anxiety 3. Next appointment: 7/8 at 11 AM

## 2020-03-21 ENCOUNTER — Telehealth: Payer: Self-pay | Admitting: Emergency Medicine

## 2020-03-21 NOTE — Telephone Encounter (Signed)
Copied from CRM [id number]. Topic: General - Call Back - No Documentation >> Mar 20, 2020 10:42 AM Randol Kern wrote: Reason for CRM: Pt's son called to report that pt has not been taking her Thyroid medication appropriately. She has been missing days/doses and that is why he believes her lab work is off.

## 2020-03-21 NOTE — Telephone Encounter (Signed)
Left message on voicemail for son to make  sure she take her medication each day as prescribed. It was explained that not taken properly it can cause a lot of different side effects

## 2020-03-21 NOTE — Telephone Encounter (Signed)
Copied from CRM [id number]. Topic: General - Other >> Mar 19, 2020  3:30 PM Worthy Keeler D wrote: Reason for CRM: Patients son ci stating he received a call from a nurse today who advised patients thyroids look abnormal and since that conversation his mother isn't feeling well and he believes it has something to do with that, no sick appts available until next week and he wants to know if she can be squeezed in for a appt, symptoms include shaking. He also stated that the nurse mentioned maybe the dosage needs to be changed on thyroid meds, he would like a call back as soon as possible.   Patient has an appointment tomorrow.

## 2020-03-22 ENCOUNTER — Ambulatory Visit (INDEPENDENT_AMBULATORY_CARE_PROVIDER_SITE_OTHER): Payer: Medicare HMO | Admitting: Family Medicine

## 2020-03-22 ENCOUNTER — Other Ambulatory Visit: Payer: Self-pay

## 2020-03-22 ENCOUNTER — Encounter: Payer: Self-pay | Admitting: Family Medicine

## 2020-03-22 VITALS — BP 100/76 | HR 76 | Temp 97.6°F | Resp 16 | Ht 60.0 in | Wt 93.7 lb

## 2020-03-22 DIAGNOSIS — E039 Hypothyroidism, unspecified: Secondary | ICD-10-CM

## 2020-03-22 DIAGNOSIS — R1312 Dysphagia, oropharyngeal phase: Secondary | ICD-10-CM

## 2020-03-22 DIAGNOSIS — E440 Moderate protein-calorie malnutrition: Secondary | ICD-10-CM

## 2020-03-22 DIAGNOSIS — I480 Paroxysmal atrial fibrillation: Secondary | ICD-10-CM

## 2020-03-22 MED ORDER — LEVOTHYROXINE SODIUM 88 MCG PO TABS: 88.0000 ug | ORAL_TABLET | Freq: Every day | ORAL | 0 refills | Status: DC

## 2020-03-22 NOTE — Progress Notes (Signed)
Name: Maureen [name]   MRN: [id number]    DOB: 08/16/1946   Date:03/22/2020       Progress Note  Subjective  Chief Complaint  Chief Complaint  Patient presents with  . Consult    HPI  Hypothyroidism: her TSH was elevated and after discussing with her son we adjusted her dose from 88 mcg to 100 mcg, however he later found out she has only been taking medication a few times a week. He calls her to remind her , but not sure if she is not taking from fear of chocking or because of depression. She was recently seen by psychiatrist, but is not taking medication as prescribed . Explained that Remeron will likely help with her appetite. She no longer drives, son does not feel it is safe for her to be out of the house alone. We will try to arrange from home health nurse.   Paroxysmal Afib: she never started Eliquis, she states cardiologist called her for a visit but she did not schedule it. She denies chest pain or palpitation  Dysphagia: still has difficulty swallowing, she is now eating baby food but only two packs per day, reminded again she needs more calories. She is very afraid of swallowing, still unknown the cause and she has not heard from GI about manometry test that is supposed to be scheduled at [facility]   Patient Active Problem List   Diagnosis Date Noted  . Vitamin D deficiency 03/01/2020  . Esophageal dysphagia   . Arrhythmia 08/25/2019  . Rectal bleeding   . Low grade squamous intraepith lesion on cytologic smear cervix (lgsil) 08/04/2018  . Chronic venous insufficiency 07/05/2018  . Lymphedema 07/05/2018  . Hypertension 04/02/2018  . Swelling of limb 04/02/2018  . Depression, major, recurrent, mild ([place]) 12/29/2017  . Leukopenia 05/24/2017  . Allergic rhinitis, seasonal 06/29/2015  . Clavus 06/29/2015  . 1st degree AV block 06/29/2015  . Hammer toe 06/29/2015  . H/O: HTN (hypertension) 06/29/2015  . Blood glucose elevated 06/29/2015  . Floater, vitreous 06/29/2015  .  Bilateral tinnitus 06/29/2015  . Hypothyroidism (acquired) 04/02/2015  . Gastroesophageal reflux disease 04/02/2015  . Hammer toe of right foot 04/02/2015  . Scoliosis of thoracic spine 04/02/2015  . Urethral prolapse 04/02/2015  . Corn of toe 04/02/2015  . Hyperlipidemia 04/02/2015  . Varicose veins of both lower extremities 04/02/2015    Past Surgical History:  Procedure Laterality Date  . ABDOMINAL HYSTERECTOMY  1989  . APPENDECTOMY  1989  . BREAST EXCISIONAL BIOPSY Bilateral    multiple biopsies negative  . BREAST SURGERY     Several  . COLONOSCOPY WITH PROPOFOL N/A 07/28/2019   Procedure: COLONOSCOPY WITH PROPOFOL;  Surgeon: Lin Landsman, MD;  Location[facility] ENDOSCOPY;  Service: Gastroenterology;  Laterality: N/A;  . ESOPHAGOGASTRODUODENOSCOPY (EGD) WITH PROPOFOL N/A 11/11/2019   Procedure: ESOPHAGOGASTRODUODENOSCOPY (EGD) WITH PROPOFOL;  Surgeon: Lin Landsman, MD;  Location[facility] ENDOSCOPY;  Service: Gastroenterology;  Laterality: N/A;  . TUMOR EXCISION  1989   same time as hysterectomy    Family History  Problem Relation Age of Onset  . Diabetes Father 70       DM complications  . Hypertension Mother 13       CKD  . Thyroid disease Mother   . Heart failure Mother   . Kidney disease Mother   . Depression Mother   . Breast cancer Sister 66  . Depression Sister   . Kidney disease Other   . Breast  cancer Maternal Aunt   . Breast cancer Cousin   . Anuerysm Son 62  . Diabetes Son     Social History   Tobacco Use  . Smoking status: Never Smoker  . Smokeless tobacco: Never Used  . Tobacco comment: smoking cessation materials not required  Substance Use Topics  . Alcohol use: Never    Alcohol/week: 0.0 standard drinks     Current Outpatient Medications:  .  Ascorbic Acid (VITAMIN C) 1000 MG tablet, Take 1,000 mg by mouth daily., Disp: , Rfl:  .  clonazepam (KLONOPIN) 0.125 MG disintegrating tablet, Take 1 tablet (0.125 mg total) by mouth 2 (two)  times daily., Disp: 60 tablet, Rfl: 0 .  Garlic 7939 MG CAPS, Take 1 capsule by mouth daily. , Disp: , Rfl:  .  Iron-Vitamins (GERITOL COMPLETE PO), Take 1 tablet by mouth daily., Disp: , Rfl:  .  levothyroxine (SYNTHROID) 100 MCG tablet, Take 1 tablet (100 mcg total) by mouth daily before breakfast. New dose, Disp: 90 tablet, Rfl: 0 .  lidocaine (XYLOCAINE) 2 % solution, Use as directed 15 mLs in the mouth or throat every 6 (six) hours as needed for mouth pain., Disp: 100 mL, Rfl: 0 .  mirtazapine (REMERON) 15 MG tablet, 7.5 mg at night for two weeks, then 15 mg at night, Disp: 30 tablet, Rfl: 1 .  omeprazole (PRILOSEC) 40 MG capsule, Take 1 capsule (40 mg total) by mouth daily before breakfast., Disp: 90 capsule, Rfl: 3 .  TURMERIC CURCUMIN PO, Take 550 mg by mouth daily., Disp: , Rfl:   Allergies  Allergen Reactions  . Aspirin     Tinnitus  . Citalopram Other (See Comments)  . Codeine Nausea And Vomiting  . Penicillins Rash    Has patient had a PCN reaction causing immediate rash, facial/tongue/throat swelling, SOB or lightheadedness with hypotension: Yes Has patient had a PCN reaction causing severe rash involving mucus membranes or skin necrosis: No Has patient had a PCN reaction that required hospitalization No Has patient had a PCN reaction occurring within the last 10 years: No If all of the above answers are "NO", then may proceed with Cephalosporin use.    I personally reviewed active problem list, medication list, allergies, family history, social history, health maintenance with the patient/caregiver today.   ROS  Ten systems reviewed and is negative except as mentioned in HPI   Objective  Vitals:   03/22/20 1120  BP: 100/76  Pulse: 76  Resp: 16  Temp: 97.6 F (36.4 C)  TempSrc: Temporal  SpO2: 97%  Weight: 93 lb 11.2 oz (42.5 kg)  Height: 5' (1.524 m)    Body mass index is 18.3 kg/m.  Physical Exam  Constitutional: Patient appears frail, cachetic looking   Sunken eyes, temporal waisting  HEENT: head atraumatic, normocephalic, pupils equal and reactive to light, neck supple, no thyromegaly  Cardiovascular: Normal rate, regular rhythm and normal heart sounds.  No murmur heard. No BLE edema. Not in afib today  Pulmonary/Chest: Effort normal and breath sounds normal. No respiratory distress. Abdominal: Soft.  There is no tenderness. Muscular skeletal: significant scoliosis  Psychiatric: Patient has a depressed mood, sometimes got frustrated with her son, other times seemed tearful .   Recent Results (from the past 2160 hour(s))  CBC with Differential/Platelet     Status: Abnormal   Collection Time: 01/25/20 11:24 AM  Result Value Ref Range   WBC 2.5 (L) 3.8 - 10.8 Thousand/uL   RBC 4.11 3.80 - 5.10  Million/uL   Hemoglobin 12.6 11.7 - 15.5 g/dL   HCT 36.8 35.0 - 45.0 %   MCV 89.5 80.0 - 100.0 fL   MCH 30.7 27.0 - 33.0 pg   MCHC 34.2 32.0 - 36.0 g/dL   RDW 13.8 11.0 - 15.0 %   Platelets 186 140 - 400 Thousand/uL   MPV 11.5 7.5 - 12.5 fL   Neutro Abs 1,053 (L) 1,500 - 7,800 cells/uL   Lymphs Abs 1,010 850 - 3,900 cells/uL   Absolute Monocytes 378 200 - 950 cells/uL   Eosinophils Absolute 30 15 - 500 cells/uL   Basophils Absolute 30 0 - 200 cells/uL   Neutrophils Relative % 42.1 %   Total Lymphocyte 40.4 %   Monocytes Relative 15.1 %   Eosinophils Relative 1.2 %   Basophils Relative 1.2 %  COMPLETE METABOLIC PANEL WITH GFR     Status: Abnormal   Collection Time: 01/25/20 11:24 AM  Result Value Ref Range   Glucose, Bld 112 (H) 65 - 99 mg/dL    Comment: .            Fasting reference interval . For someone without known diabetes, a glucose value between 100 and 125 mg/dL is consistent with prediabetes and should be confirmed with a follow-up test. .    BUN 4 (L) 7 - 25 mg/dL   Creat 0.66 0.60 - 0.93 mg/dL    Comment: For patients >69 years of age, the reference limit for Creatinine is approximately 13% higher for  people identified as African-American. .    GFR, Est Non African American 87 > OR = 60 mL/min/1.49m   GFR, Est African American 101 > OR = 60 mL/min/1.758m  BUN/Creatinine Ratio 6 6 - 22 (calc)   Sodium 131 (L) 135 - 146 mmol/L   Potassium 4.4 3.5 - 5.3 mmol/L   Chloride 89 (L) 98 - 110 mmol/L   CO2 34 (H) 20 - 32 mmol/L   Calcium 9.7 8.6 - 10.4 mg/dL   Total Protein 7.0 6.1 - 8.1 g/dL   Albumin 4.0 3.6 - 5.1 g/dL   Globulin 3.0 1.9 - 3.7 g/dL (calc)   AG Ratio 1.3 1.0 - 2.5 (calc)   Total Bilirubin 0.5 0.2 - 1.2 mg/dL   Alkaline phosphatase (APISO) 43 37 - 153 U/L   AST 24 10 - 35 U/L   ALT 11 6 - 29 U/L  Vitamin B12     Status: Abnormal   Collection Time: 01/25/20 11:24 AM  Result Value Ref Range   Vitamin B-12 1,254 (H) 200 - 1,100 pg/mL  VITAMIN D 25 Hydroxy (Vit-D Deficiency, Fractures)     Status: Abnormal   Collection Time: 01/25/20 11:24 AM  Result Value Ref Range   Vit D, 25-Hydroxy 16 (L) 30 - 100 ng/mL    Comment: Vitamin D Status         25-OH Vitamin D: . Deficiency:                    <20 ng/mL Insufficiency:             20 - 29 ng/mL Optimal:                 > or = 30 ng/mL . For 25-OH Vitamin D testing on patients on  D2-supplementation and patients for whom quantitation  of D2 and D3 fractions is required, the Quest[facility]ureD(TM) 25-OH VIT D, (D2,D3), LC/MS/MS is recommended: order  code [phone] >  52yr). See Note 1 . Note 1 . For additional information, please refer to  http://education.Quest[facility]ostics.com/faq/FAQ199  (This link is being provided for informational/ educational purposes only.)   TSH     Status: Abnormal   Collection Time: 01/25/20 11:24 AM  Result Value Ref Range   TSH 16.74 (H) 0.40 - 4.50 mIU/L  Folate     Status: None   Collection Time: 02/13/20  3:30 PM  Result Value Ref Range   Folate 13.5 >5.9 ng/mL    Comment: [facility]formed at [facility] [address], [place] [address] Iron and TIBC      Status: None   Collection Time: 02/13/20  3:30 PM  Result Value Ref Range   Iron 53 28 - 170 ug/dL   TIBC 288 250 - 450 ug/dL   Saturation Ratios 18 10.4 - 31.8 %   UIBC 235 ug/dL    Comment: [facility]formed at [facility] [address], [place] [address] Ferritin     Status: None   Collection Time: 02/13/20  3:30 PM  Result Value Ref Range   Ferritin 201 11 - 307 ng/mL    Comment: [facility]formed at [facility] [address], [place] [address] NEUTROPHIL AB TEST LEVEL 1     Status: None   Collection Time: 02/13/20  3:30 PM  Result Value Ref Range   NEUTROPHIL SCR/PANEL INTERP. Negative     Comment: (NOTE) Performed At: [id number] Farmer[place] PYM:[id number]  Lactate dehydrogenase     Status: None   Collection Time: 02/13/20  3:30 PM  Result Value Ref Range   LDH 157 98 - 192 U/L    Comment: [facility]formed at [facility] [address], [place] [address] CBC with Differential/Platelet     Status: Abnormal   Collection Time: 02/13/20  3:30 PM  Result Value Ref Range   WBC 2.5 (L) 4.0 - 10.5 K/uL   RBC 4.02 3.87 - 5.11 MIL/uL   Hemoglobin 12.4 12.0 - 15.0 g/dL   HCT 35.5 (L) 36.0 - 46.0 %   MCV 88.3 80.0 - 100.0 fL   MCH 30.8 26.0 - 34.0 pg   MCHC 34.9 30.0 - 36.0 g/dL   RDW 14.3 11.5 - 15.5 %   Platelets 179 150 - 400 K/uL   nRBC 0.0 0.0 - 0.2 %   Neutrophils Relative % 33 %   Neutro Abs 0.8 (L) 1.7 - 7.7 K/uL   Lymphocytes Relative 50 %   Lymphs Abs 1.2 0.7 - 4.0 K/uL   Monocytes Relative 14 %   Monocytes Absolute 0.4 0.1 - 1.0 K/uL   Eosinophils Relative 2 %   Eosinophils Absolute 0.0 0.0 - 0.5 K/uL   Basophils Relative 1 %   Basophils Absolute 0.0 0.0 - 0.1 K/uL   Immature Granulocytes 0 %   Abs Immature Granulocytes 0.00 0.00 - 0.07 K/uL    Comment: [facility]formed at [facility] [address], [place]  [address] Flow cytometry  panel-leukemia/lymphoma work-up     Status: None   Collection Time: 02/13/20  3:30 PM  Result Value Ref Range   PATH INTERP XXX-IMP Comment     Comment: No significant immunophenotypic abnormality detected   CLINICAL INFO Comment     Comment: (NOTE) Accompanying CBC dated 02-13-20 shows: WBC count 2.5, Neu 0.8, Lym 1.2, Mon 0.4.    Specimen Type Comment  Comment: Peripheral blood   ASSESSMENT OF LEUKOCYTES Comment     Comment: (NOTE) No monoclonal B cell population is detected. kappa:lambda ratio 2.0 There is no loss of, or aberrant expression of, the pan T cell antigens to suggest a neoplastic T cell process. CD4:CD8 ratio 1.5 CD57 positive cells are increased but are composed of a mixture of CD4 and CD8 positive T cells and NK cells, most consistent with a reactive process. No circulating blasts are detected. There is no immunophenotypic  evidence of abnormal myeloid maturation. Analysis of the leukocyte population shows: granulocytes 34%, monocytes 7%, lymphocytes 59%, blasts <0.1%, B cells 12%, T cells 29%, LGLs 18%, NK cells 19%.    % Viable Cells Comment     Comment: 96%   ANALYSIS AND GATING STRATEGY Comment     Comment: 8 color analysis with CD45/SSC   IMMUNOPHENOTYPING STUDY Comment     Comment: (NOTE) CD2       Normal         CD3       Normal CD4       Normal         CD5       Normal CD7       Normal         CD8       Normal CD10      Normal         CD11b     Normal CD13      Normal         CD14      Normal CD16      Normal         CD19      Normal CD20      Normal         CD33      Normal CD34      Normal         CD38      Normal CD45      Normal         CD56      Normal CD57      Normal         CD117     Normal HLA-DR    Normal         KAPPA     Normal LAMBDA    Normal         CD64      Normal    PATHOLOGIST NAME Comment     Comment: Henrietta Hoover, M.D.   COMMENT: Comment     Comment: (NOTE) Each antibody in this assay was utilized to assess for  potential abnormalities of studied cell populations or to characterize identified abnormalities. This test was developed and its performance characteristics determined by [facility].  It has not been cleared or approved by the U.S. Food and Drug Administration. The FDA has determined that such clearance or approval is not necessary. This test is used for clinical purposes.  It should not be regarded as investigational or for research. Performed At: -[facility] [address] [place], [place] [id number] Katina Degree MDPhD QI:[id number] Performed At: [facility] [address] [place], [place] [id number] Katina Degree MDPhD YJ:[id number]   TSH     Status: Abnormal   Collection Time: 03/15/20 10:39 AM  Result Value Ref Range   TSH 12.52 (H) 0.40 - 4.50 mIU/L      PHQ2/9: Depression screen [facility] 2/9 03/22/2020 03/15/2020 01/20/2020 12/22/2019  11/30/2019  Decreased Interest 3 2 2 3 2   Down, Depressed, Hopeless 3 1 1 1 1   PHQ - 2 Score 6 3 3 4 3   Altered sleeping 3 2 0 0 1  Tired, decreased energy 3 2 1 2 2   Change in appetite 3 2 3 3 2   Feeling bad or failure about yourself  3 1 3 3 1   Trouble concentrating 3 1 0 0 2  Moving slowly or fidgety/restless 3 2 0 3 1  Suicidal thoughts 0 0 1 0 0  PHQ-9 Score 24 13 11 15 12   Difficult doing work/chores Very difficult Somewhat difficult Very difficult Somewhat difficult -  Some recent data might be hidden    phq 9 is positive   Fall Risk: Fall Risk  03/22/2020 03/15/2020 11/30/2019 10/20/2019 09/28/2019  Falls in the past year? 0 0 0 0 0  Number falls in past yr: 0 0 0 0 0  Injury with Fall? 0 0 0 - 0  Risk for fall due to : - - - - -  Risk for fall due to: Comment - - - - -  Follow up Falls evaluation completed - - Falls evaluation completed -      Assessment & Plan  1. Hypothyroidism (acquired)  We will place referral to home health to see if they can monitor her medication intake , having problems with compliance that may be  secondary to depression of fear of swallowing  - levothyroxine (SYNTHROID) 88 MCG tablet; Take 1 tablet (88 mcg total) by mouth daily before breakfast.  Dispense: 90 tablet; Refill: 0  2. Oropharyngeal dysphagia  She is cachetic now   3. Paroxysmal atrial fibrillation [facility])  She never followed up with cardiologist

## 2020-03-29 DIAGNOSIS — I480 Paroxysmal atrial fibrillation: Secondary | ICD-10-CM | POA: Diagnosis not present

## 2020-03-29 DIAGNOSIS — F330 Major depressive disorder, recurrent, mild: Secondary | ICD-10-CM | POA: Diagnosis not present

## 2020-03-29 DIAGNOSIS — R1312 Dysphagia, oropharyngeal phase: Secondary | ICD-10-CM | POA: Diagnosis not present

## 2020-03-29 DIAGNOSIS — I10 Essential (primary) hypertension: Secondary | ICD-10-CM | POA: Diagnosis not present

## 2020-03-29 DIAGNOSIS — E559 Vitamin D deficiency, unspecified: Secondary | ICD-10-CM | POA: Diagnosis not present

## 2020-03-29 DIAGNOSIS — E440 Moderate protein-calorie malnutrition: Secondary | ICD-10-CM | POA: Diagnosis not present

## 2020-03-29 DIAGNOSIS — E785 Hyperlipidemia, unspecified: Secondary | ICD-10-CM | POA: Diagnosis not present

## 2020-03-29 DIAGNOSIS — F419 Anxiety disorder, unspecified: Secondary | ICD-10-CM | POA: Diagnosis not present

## 2020-03-29 DIAGNOSIS — E039 Hypothyroidism, unspecified: Secondary | ICD-10-CM | POA: Diagnosis not present

## 2020-04-05 ENCOUNTER — Telehealth: Payer: Self-pay | Admitting: Family Medicine

## 2020-04-05 DIAGNOSIS — E559 Vitamin D deficiency, unspecified: Secondary | ICD-10-CM | POA: Diagnosis not present

## 2020-04-05 DIAGNOSIS — F419 Anxiety disorder, unspecified: Secondary | ICD-10-CM | POA: Diagnosis not present

## 2020-04-05 DIAGNOSIS — F330 Major depressive disorder, recurrent, mild: Secondary | ICD-10-CM | POA: Diagnosis not present

## 2020-04-05 DIAGNOSIS — E785 Hyperlipidemia, unspecified: Secondary | ICD-10-CM | POA: Diagnosis not present

## 2020-04-05 DIAGNOSIS — E039 Hypothyroidism, unspecified: Secondary | ICD-10-CM | POA: Diagnosis not present

## 2020-04-05 DIAGNOSIS — E440 Moderate protein-calorie malnutrition: Secondary | ICD-10-CM | POA: Diagnosis not present

## 2020-04-05 DIAGNOSIS — I10 Essential (primary) hypertension: Secondary | ICD-10-CM | POA: Diagnosis not present

## 2020-04-05 DIAGNOSIS — I480 Paroxysmal atrial fibrillation: Secondary | ICD-10-CM | POA: Diagnosis not present

## 2020-04-05 DIAGNOSIS — R1312 Dysphagia, oropharyngeal phase: Secondary | ICD-10-CM | POA: Diagnosis not present

## 2020-04-05 NOTE — Telephone Encounter (Signed)
Called to get verbal orders for speech therapy.  Questions please call at [phone]

## 2020-04-06 NOTE — Telephone Encounter (Signed)
Patient is having swallowing difficulties. Maureen [name] from [facility] feels like she can work with her eating in stages, once she can get her where she needs to be. Maureen [name] would like to see her twice a week for 4 weeks. She will stop therapy once she can get her past eating something other than baby food. She thinks it is more psychological, but she wants to give it a try and not just it's normal.

## 2020-04-09 NOTE — Telephone Encounter (Signed)
Minda from [facility] has been notified. LVM for approval on orders.

## 2020-04-10 DIAGNOSIS — F419 Anxiety disorder, unspecified: Secondary | ICD-10-CM | POA: Diagnosis not present

## 2020-04-10 DIAGNOSIS — E039 Hypothyroidism, unspecified: Secondary | ICD-10-CM | POA: Diagnosis not present

## 2020-04-10 DIAGNOSIS — E440 Moderate protein-calorie malnutrition: Secondary | ICD-10-CM | POA: Diagnosis not present

## 2020-04-10 DIAGNOSIS — I10 Essential (primary) hypertension: Secondary | ICD-10-CM | POA: Diagnosis not present

## 2020-04-10 DIAGNOSIS — F330 Major depressive disorder, recurrent, mild: Secondary | ICD-10-CM | POA: Diagnosis not present

## 2020-04-10 DIAGNOSIS — E559 Vitamin D deficiency, unspecified: Secondary | ICD-10-CM | POA: Diagnosis not present

## 2020-04-10 DIAGNOSIS — R1312 Dysphagia, oropharyngeal phase: Secondary | ICD-10-CM | POA: Diagnosis not present

## 2020-04-10 DIAGNOSIS — E785 Hyperlipidemia, unspecified: Secondary | ICD-10-CM | POA: Diagnosis not present

## 2020-04-10 DIAGNOSIS — I480 Paroxysmal atrial fibrillation: Secondary | ICD-10-CM | POA: Diagnosis not present

## 2020-04-11 ENCOUNTER — Other Ambulatory Visit: Payer: Self-pay | Admitting: Family Medicine

## 2020-04-11 ENCOUNTER — Telehealth: Payer: Self-pay | Admitting: Family Medicine

## 2020-04-11 DIAGNOSIS — R1312 Dysphagia, oropharyngeal phase: Secondary | ICD-10-CM

## 2020-04-11 NOTE — Chronic Care Management (AMB) (Signed)
  Chronic Care Management   Note  04/11/2020 Name: [name] MRN: [id number] DOB: 07-14-1946  Maureen [name] is a 74 y.o. year old female who is a primary care patient of Steele Sizer, MD. I reached out to [employer] by phone today in response to a referral sent by [name]'s health plan.     [name] was given information about Chronic Care Management services today including:  1. CCM service includes personalized support from designated clinical staff supervised by her physician, including individualized plan of care and coordination with other care providers 2. 24/7 contact phone numbers for assistance for urgent and routine care needs. 3. Service will only be billed when office clinical staff spend 20 minutes or more in a month to coordinate care. 4. Only one practitioner may furnish and bill the service in a calendar month. 5. The patient may stop CCM services at any time (effective at the end of the month) by phone call to the office staff. 6. The patient will be responsible for cost sharing (co-pay) of up to 20% of the service fee (after annual deductible is met).  [name] Maureen [name] agreed to services and verbal consent obtained.   Follow up plan: Telephone appointment with care management team member scheduled for:05/14/2020  Noreene Larsson, [place], [place], [place] [address] Direct Dial: [phone] [email or web address]: [email or web address]

## 2020-04-12 DIAGNOSIS — I480 Paroxysmal atrial fibrillation: Secondary | ICD-10-CM | POA: Diagnosis not present

## 2020-04-12 DIAGNOSIS — E039 Hypothyroidism, unspecified: Secondary | ICD-10-CM | POA: Diagnosis not present

## 2020-04-12 DIAGNOSIS — F419 Anxiety disorder, unspecified: Secondary | ICD-10-CM | POA: Diagnosis not present

## 2020-04-12 DIAGNOSIS — E440 Moderate protein-calorie malnutrition: Secondary | ICD-10-CM | POA: Diagnosis not present

## 2020-04-12 DIAGNOSIS — R1312 Dysphagia, oropharyngeal phase: Secondary | ICD-10-CM | POA: Diagnosis not present

## 2020-04-12 DIAGNOSIS — F330 Major depressive disorder, recurrent, mild: Secondary | ICD-10-CM | POA: Diagnosis not present

## 2020-04-12 DIAGNOSIS — E785 Hyperlipidemia, unspecified: Secondary | ICD-10-CM | POA: Diagnosis not present

## 2020-04-12 DIAGNOSIS — I10 Essential (primary) hypertension: Secondary | ICD-10-CM | POA: Diagnosis not present

## 2020-04-12 DIAGNOSIS — E559 Vitamin D deficiency, unspecified: Secondary | ICD-10-CM | POA: Diagnosis not present

## 2020-04-17 DIAGNOSIS — E559 Vitamin D deficiency, unspecified: Secondary | ICD-10-CM | POA: Diagnosis not present

## 2020-04-17 DIAGNOSIS — I10 Essential (primary) hypertension: Secondary | ICD-10-CM | POA: Diagnosis not present

## 2020-04-17 DIAGNOSIS — E039 Hypothyroidism, unspecified: Secondary | ICD-10-CM | POA: Diagnosis not present

## 2020-04-17 DIAGNOSIS — E440 Moderate protein-calorie malnutrition: Secondary | ICD-10-CM | POA: Diagnosis not present

## 2020-04-17 DIAGNOSIS — I480 Paroxysmal atrial fibrillation: Secondary | ICD-10-CM | POA: Diagnosis not present

## 2020-04-17 DIAGNOSIS — R1312 Dysphagia, oropharyngeal phase: Secondary | ICD-10-CM | POA: Diagnosis not present

## 2020-04-17 DIAGNOSIS — F419 Anxiety disorder, unspecified: Secondary | ICD-10-CM | POA: Diagnosis not present

## 2020-04-17 DIAGNOSIS — E785 Hyperlipidemia, unspecified: Secondary | ICD-10-CM | POA: Diagnosis not present

## 2020-04-17 DIAGNOSIS — F330 Major depressive disorder, recurrent, mild: Secondary | ICD-10-CM | POA: Diagnosis not present

## 2020-04-20 DIAGNOSIS — E440 Moderate protein-calorie malnutrition: Secondary | ICD-10-CM | POA: Diagnosis not present

## 2020-04-20 DIAGNOSIS — E559 Vitamin D deficiency, unspecified: Secondary | ICD-10-CM | POA: Diagnosis not present

## 2020-04-20 DIAGNOSIS — F330 Major depressive disorder, recurrent, mild: Secondary | ICD-10-CM | POA: Diagnosis not present

## 2020-04-20 DIAGNOSIS — I10 Essential (primary) hypertension: Secondary | ICD-10-CM | POA: Diagnosis not present

## 2020-04-20 DIAGNOSIS — E039 Hypothyroidism, unspecified: Secondary | ICD-10-CM | POA: Diagnosis not present

## 2020-04-20 DIAGNOSIS — E785 Hyperlipidemia, unspecified: Secondary | ICD-10-CM | POA: Diagnosis not present

## 2020-04-20 DIAGNOSIS — F419 Anxiety disorder, unspecified: Secondary | ICD-10-CM | POA: Diagnosis not present

## 2020-04-20 DIAGNOSIS — R1312 Dysphagia, oropharyngeal phase: Secondary | ICD-10-CM | POA: Diagnosis not present

## 2020-04-20 DIAGNOSIS — I480 Paroxysmal atrial fibrillation: Secondary | ICD-10-CM | POA: Diagnosis not present

## 2020-04-24 ENCOUNTER — Telehealth: Payer: Self-pay

## 2020-04-24 DIAGNOSIS — F419 Anxiety disorder, unspecified: Secondary | ICD-10-CM | POA: Diagnosis not present

## 2020-04-24 DIAGNOSIS — F330 Major depressive disorder, recurrent, mild: Secondary | ICD-10-CM | POA: Diagnosis not present

## 2020-04-24 DIAGNOSIS — E039 Hypothyroidism, unspecified: Secondary | ICD-10-CM | POA: Diagnosis not present

## 2020-04-24 DIAGNOSIS — I480 Paroxysmal atrial fibrillation: Secondary | ICD-10-CM | POA: Diagnosis not present

## 2020-04-24 DIAGNOSIS — R1312 Dysphagia, oropharyngeal phase: Secondary | ICD-10-CM | POA: Diagnosis not present

## 2020-04-24 DIAGNOSIS — E559 Vitamin D deficiency, unspecified: Secondary | ICD-10-CM | POA: Diagnosis not present

## 2020-04-24 DIAGNOSIS — E440 Moderate protein-calorie malnutrition: Secondary | ICD-10-CM | POA: Diagnosis not present

## 2020-04-24 DIAGNOSIS — I10 Essential (primary) hypertension: Secondary | ICD-10-CM | POA: Diagnosis not present

## 2020-04-24 DIAGNOSIS — E785 Hyperlipidemia, unspecified: Secondary | ICD-10-CM | POA: Diagnosis not present

## 2020-04-24 NOTE — Telephone Encounter (Signed)
I contacted this patient's son to inform him that I did receive a call back from [facility], but there was no answer. A message was left stating that since she is already established with another gastro office in [place] they would have to do a transfer of care which requires a release of medical records from their office. She also to me that their 1st available is not until mid August.  A message was left for him to give me a call on how he chooses to proceed.  CRM will be placed.

## 2020-04-25 NOTE — Telephone Encounter (Signed)
Got a call from this patient's son stating that he did want to proceed with the referral and that August would be fine.  I then called [facility] and spoke with Chander. She asked that we  send the notes from the other office, [facility], (since we referred her- I was able to) directly to her at [phone].  Release ID # [id number]

## 2020-04-25 NOTE — Progress Notes (Deleted)
BH MD/PA/NP OP Progress Note  04/25/2020 9:34 AM Maureen [name]  MRN:  [id number]  Chief Complaint:  HPI:  - she is referred to home health since the last visit.   Visit Diagnosis: No diagnosis found.  Past Psychiatric History: Please see initial evaluation for full details. I have reviewed the history. No updates at this time.     Past Medical History:  Past Medical History:  Diagnosis Date  . Allergy   . Corn of toe   . First degree AV block   . GERD (gastroesophageal reflux disease)   . Hammer toe of right foot   . Hyperglycemia   . Hyperlipidemia   . Hypertension   . Loss of appetite   . Prolapse of urethra   . Scoliosis (and kyphoscoliosis), idiopathic   . Thyroid disease   . Tinnitus of both ears   . Vitreous floaters of right eye     Past Surgical History:  Procedure Laterality Date  . ABDOMINAL HYSTERECTOMY  1989  . APPENDECTOMY  1989  . BREAST EXCISIONAL BIOPSY Bilateral    multiple biopsies negative  . BREAST SURGERY     Several  . COLONOSCOPY WITH PROPOFOL N/A 07/28/2019   Procedure: COLONOSCOPY WITH PROPOFOL;  Surgeon: Toney Reil, MD;  Location: [facility] ENDOSCOPY;  Service: Gastroenterology;  Laterality: N/A;  . ESOPHAGOGASTRODUODENOSCOPY (EGD) WITH PROPOFOL N/A 11/11/2019   Procedure: ESOPHAGOGASTRODUODENOSCOPY (EGD) WITH PROPOFOL;  Surgeon: Toney Reil, MD;  Location: [facility] ENDOSCOPY;  Service: Gastroenterology;  Laterality: N/A;  . TUMOR EXCISION  1989   same time as hysterectomy    Family Psychiatric History: Please see initial evaluation for full details. I have reviewed the history. No updates at this time.     Family History:  Family History  Problem Relation Age of Onset  . Diabetes Father 71       DM complications  . Hypertension Mother 58       CKD  . Thyroid disease Mother   . Heart failure Mother   . Kidney disease Mother   . Depression Mother   . Breast cancer Sister 34  . Depression Sister   . Kidney disease Other    . Breast cancer Maternal Aunt   . Breast cancer Cousin   . Anuerysm Son 51  . Diabetes Son     Social History:  Social History   Socioeconomic History  . Marital status: Widowed    Spouse name: Not on file  . Number of children: 3  . Years of education: 4  . Highest education level: 12th grade  Occupational History  . Occupation: Retired  Tobacco Use  . Smoking status: Never Smoker  . Smokeless tobacco: Never Used  . Tobacco comment: smoking cessation materials not required  Vaping Use  . Vaping Use: Never used  Substance and Sexual Activity  . Alcohol use: Never    Alcohol/week: 0.0 standard drinks  . Drug use: No  . Sexual activity: Not Currently    Partners: Male  Other Topics Concern  . Not on file  Social History Narrative  . Not on file   Social Determinants of Health   Financial Resource Strain:   . Difficulty of Paying Living Expenses:   Food Insecurity:   . Worried About Programme researcher, broadcasting/film/video in the Last Year:   . Barista in the Last Year:   Transportation Needs:   . Freight forwarder (Medical):   Marland Kitchen Lack of Transportation (Non-Medical):  Physical Activity: Unknown  . Days of Exercise per Week: 3 days  . Minutes of Exercise per Session: Not on file  Stress:   . Feeling of Stress :   Social Connections:   . Frequency of Communication with Friends and Family:   . Frequency of Social Gatherings with Friends and Family:   . Attends Religious Services:   . Active Member of Clubs or Organizations:   . Attends Banker Meetings:   Marland Kitchen Marital Status:     Allergies:  Allergies  Allergen Reactions  . Aspirin     Tinnitus  . Citalopram Other (See Comments)  . Codeine Nausea And Vomiting  . Penicillins Rash    Has patient had a PCN reaction causing immediate rash, facial/tongue/throat swelling, SOB or lightheadedness with hypotension: Yes Has patient had a PCN reaction causing severe rash involving mucus membranes or skin  necrosis: No Has patient had a PCN reaction that required hospitalization No Has patient had a PCN reaction occurring within the last 10 years: No If all of the above answers are "NO", then may proceed with Cephalosporin use.    Metabolic Disorder Labs: Lab Results  Component Value Date   HGBA1C 5.9 (H) 06/17/2019   MPG 123 06/17/2019   MPG 126 09/30/2018   No results found for: PROLACTIN Lab Results  Component Value Date   CHOL 179 06/17/2019   TRIG 64 06/17/2019   HDL 45 (L) 06/17/2019   CHOLHDL 4.0 06/17/2019   VLDL 17 01/20/2017   LDLCALC 118 (H) 06/17/2019   LDLCALC 136 (H) 01/08/2018   Lab Results  Component Value Date   TSH 12.52 (H) 03/15/2020   TSH 16.74 (H) 01/25/2020    Therapeutic Level Labs: No results found for: LITHIUM No results found for: VALPROATE No components found for:  CBMZ  Current Medications: Current Outpatient Medications  Medication Sig Dispense Refill  . Ascorbic Acid (VITAMIN C) 1000 MG tablet Take 1,000 mg by mouth daily.    . clonazepam (KLONOPIN) 0.125 MG disintegrating tablet Take 1 tablet (0.125 mg total) by mouth 2 (two) times daily. 60 tablet 0  . Garlic 1000 MG CAPS Take 1 capsule by mouth daily.     . Iron-Vitamins (GERITOL COMPLETE PO) Take 1 tablet by mouth daily.    Marland Kitchen levothyroxine (SYNTHROID) 88 MCG tablet Take 1 tablet (88 mcg total) by mouth daily before breakfast. 90 tablet 0  . lidocaine (XYLOCAINE) 2 % solution Use as directed 15 mLs in the mouth or throat every 6 (six) hours as needed for mouth pain. 100 mL 0  . mirtazapine (REMERON) 15 MG tablet 7.5 mg at night for two weeks, then 15 mg at night 30 tablet 1  . omeprazole (PRILOSEC) 40 MG capsule Take 1 capsule (40 mg total) by mouth daily before breakfast. 90 capsule 3  . TURMERIC CURCUMIN PO Take 550 mg by mouth daily.     No current facility-administered medications for this visit.     Musculoskeletal: Strength & Muscle Tone: N/A Gait & Station: N/A Patient  leans: N/A  Psychiatric Specialty Exam: Review of Systems  There were no vitals taken for this visit.There is no height or weight on file to calculate BMI.  General Appearance: {Appearance:22683}  Eye Contact:  {BHH EYE CONTACT:22684}  Speech:  Clear and Coherent  Volume:  Normal  Mood:  {BHH MOOD:22306}  Affect:  {Affect (PAA):22687}  Thought Process:  Coherent  Orientation:  Full (Time, Place, and Person)  Thought Content: Logical  Suicidal Thoughts:  {ST/HT (PAA):22692}  Homicidal Thoughts:  {ST/HT (PAA):22692}  Memory:  Immediate;   Good  Judgement:  {Judgement (PAA):22694}  Insight:  {Insight (PAA):22695}  Psychomotor Activity:  Normal  Concentration:  Concentration: Good and Attention Span: Good  Recall:  Good  Fund of Knowledge: Good  Language: Good  Akathisia:  No  Handed:  Right  AIMS (if indicated): not done  Assets:  Communication Skills Desire for Improvement  ADL's:  Intact  Cognition: WNL  Sleep:  {BHH GOOD/FAIR/POOR:22877}   Screenings: GAD-7     Office Visit from 12/22/2019 in [facility] Office Visit from 11/30/2019 in [facility] Office Visit from 01/11/2019 in [facility] Office Visit from 09/30/2018 in [facility] Visit from 06/02/2016 in [facility]  Total GAD-7 Score 12 7 3 2 16     PHQ2-9     Office Visit from 03/22/2020 in [facility] Office Visit from 03/15/2020 in [facility] Office Visit from 01/20/2020 in [facility] Office Visit from 12/22/2019 in [facility] Visit from 11/30/2019 in [facility]  PHQ-2 Total Score 6 3 3 4 3   PHQ-9 Total Score 24 13 11 15 12        Assessment and Plan:  NEREA BORDENAVE is a 74 y.o. year old female with a history of depression,malnutrition, hypothyroidism, , who presents for follow up appointment for below.     1. Current mild episode of major depressive disorder without prior episode (HCC) 2. Anxiety disorder, unspecified type She continues to report depressive symptoms with anxiety, fear of choking since the last visit, and has had weight loss in the context of non adherence to levothyroxine.  She has not started mirtazapine which was discussed at the previous visit. The patient and her son were both informed to start this medication to target depression, anxiety and appetite loss.  Discussed the importance of adherence to medication.  Will continue clonazepam as needed for anxiety, which has been prescribed by PCP.  Although this medication would be preferred to avoid risk of fall, her son reports great benefit from this medication. Although she will greatly benefit from CBT, no therapist is currently available at [facility]. Will consider referral to outside if the patient is amenable. Noted that she has been nonadherent to levothyroxine, although she is now agreeable to reinitiate this medication.   # r/o mild neurocognitive disorder Although she is alert and oriented during the interview, her son reports a few episodes of confusionin the past.ADL independent,and no safety concern.Will continue to assess.  Plan  1. Start mirtazapine7.5 mg at night for two weeks, then 15 mg at night 2. Continue clonazepam 0.125 mg twice a day as needed for anxiety 3. Next appointment: 7/8 at 11 AM for 30 mins, video - She has an appointment with speech therapy at [facility]  The patient demonstrates the following risk factors for suicide: Chronic risk factors for suicide include:N/A. Acute risk factorsfor suicide include: unemployment. Protective factorsfor this patient include: positive social support. Considering these factors, the overall suicide risk at this point appears to below. Patientisappropriate for outpatient follow up.  66, MD 04/25/2020, 9:34 AM

## 2020-04-26 ENCOUNTER — Ambulatory Visit: Payer: Self-pay | Admitting: *Deleted

## 2020-04-26 DIAGNOSIS — E785 Hyperlipidemia, unspecified: Secondary | ICD-10-CM | POA: Diagnosis not present

## 2020-04-26 DIAGNOSIS — I10 Essential (primary) hypertension: Secondary | ICD-10-CM | POA: Diagnosis not present

## 2020-04-26 DIAGNOSIS — I480 Paroxysmal atrial fibrillation: Secondary | ICD-10-CM | POA: Diagnosis not present

## 2020-04-26 DIAGNOSIS — F419 Anxiety disorder, unspecified: Secondary | ICD-10-CM | POA: Diagnosis not present

## 2020-04-26 DIAGNOSIS — E559 Vitamin D deficiency, unspecified: Secondary | ICD-10-CM | POA: Diagnosis not present

## 2020-04-26 DIAGNOSIS — R1312 Dysphagia, oropharyngeal phase: Secondary | ICD-10-CM | POA: Diagnosis not present

## 2020-04-26 DIAGNOSIS — F330 Major depressive disorder, recurrent, mild: Secondary | ICD-10-CM | POA: Diagnosis not present

## 2020-04-26 DIAGNOSIS — E039 Hypothyroidism, unspecified: Secondary | ICD-10-CM | POA: Diagnosis not present

## 2020-04-26 DIAGNOSIS — E440 Moderate protein-calorie malnutrition: Secondary | ICD-10-CM | POA: Diagnosis not present

## 2020-04-26 NOTE — Telephone Encounter (Signed)
Menda, a Human resources officer from [facility] calling to report low BP readings for patient during visits. Menda states that BP reading were taken with an automatic cuff in both arms about 15 min before calling. Readings ere 89/58, 91/58, and 90/58 with a pulse of 81. Patient denies any dizziness, weakness or feeling light headed. Menda reports that past BP readings have been 108/72, 104/69, 106/68, and 110/69 with pulse ranging in the 70s. Patient also has complaints of increased thirst and excessive urination. Patient reports that she has urinated at least 6 times since 8 am. Patient states her urine is "a little strong" in odor. Patient scheduled for appt on 04/27/20. Patient advised that if BP goes below 90 or if symptoms become worse. Patient verbalized understanding.   Reason for Disposition  [1] Systolic BP < 90 AND [2] NOT dizzy, lightheaded or weak  Answer Assessment - Initial Assessment Questions 1. BLOOD PRESSURE: "What is the blood pressure?" "Did you take at least two measurements 5 minutes apart?"     90/58, 91/58, 89/58 taken in both arms 2. ONSET: "When did you take your blood pressure?"     15 min prior to call 3. HOW: "How did you obtain the blood pressure?" (e.g., visiting nurse, automatic home BP monitor)     Automatic cuff 4. HISTORY: "Do you have a history of low blood pressure?" "What is your blood pressure normally?"     Yes, BP usually runs 108/72, 104/69, 106/68 and 110/69 per the speech therapist with [facility] 5. MEDICATIONS: "Are you taking any medications for blood pressure?" If Yes, ask: "Have they been changed recently?"     No 6. PULSE RATE: "Do you know what your pulse rate is?"      81 7. OTHER SYMPTOMS: "Have you been sick recently?" "Have you had a recent injury?" Patient has been experiencing more frequent urination and increased thirst 8. PREGNANCY: "Is there any chance you are pregnant?" "When was your last menstrual period?"     n/a  Answer Assessment - Initial  Assessment Questions 1. SYMPTOM: "What's the main symptom you're concerned about?" (e.g., frequency, incontinence)     Increased urination  2. ONSET: "When did the  Increased urination  start?"     A couple of days ago 3. PAIN: "Is there any pain?" If Yes, ask: "How bad is it?" (Scale: 1-10; mild, moderate, severe)     Denies any pain 4. CAUSE: "What do you think is causing the symptoms?"     unknown 5. OTHER SYMPTOMS: "Do you have any other symptoms?" (e.g., fever, flank pain, blood in urine, pain with urination)     Increased thirst, urine odor is a little strong, not as "yellow" as it usually is 6. PREGNANCY: "Is there any chance you are pregnant?" "When was your last menstrual period?"     n/a  Protocols used: BLOOD PRESSURE - LOW-A-AH, URINARY SYMPTOMS-A-AH

## 2020-04-27 ENCOUNTER — Encounter: Payer: Self-pay | Admitting: Family Medicine

## 2020-04-27 ENCOUNTER — Other Ambulatory Visit: Payer: Self-pay | Admitting: Family Medicine

## 2020-04-27 ENCOUNTER — Other Ambulatory Visit: Payer: Self-pay

## 2020-04-27 ENCOUNTER — Ambulatory Visit (INDEPENDENT_AMBULATORY_CARE_PROVIDER_SITE_OTHER): Payer: Medicare HMO | Admitting: Family Medicine

## 2020-04-27 VITALS — BP 84/58 | HR 82 | Temp 97.8°F | Resp 12 | Ht 61.0 in | Wt 86.8 lb

## 2020-04-27 DIAGNOSIS — D708 Other neutropenia: Secondary | ICD-10-CM

## 2020-04-27 DIAGNOSIS — R1312 Dysphagia, oropharyngeal phase: Secondary | ICD-10-CM | POA: Diagnosis not present

## 2020-04-27 DIAGNOSIS — E43 Unspecified severe protein-calorie malnutrition: Secondary | ICD-10-CM | POA: Diagnosis not present

## 2020-04-27 DIAGNOSIS — R350 Frequency of micturition: Secondary | ICD-10-CM

## 2020-04-27 DIAGNOSIS — I9589 Other hypotension: Secondary | ICD-10-CM

## 2020-04-27 DIAGNOSIS — E039 Hypothyroidism, unspecified: Secondary | ICD-10-CM

## 2020-04-27 DIAGNOSIS — F419 Anxiety disorder, unspecified: Secondary | ICD-10-CM | POA: Diagnosis not present

## 2020-04-27 DIAGNOSIS — F5082 Avoidant/restrictive food intake disorder: Secondary | ICD-10-CM | POA: Diagnosis not present

## 2020-04-27 DIAGNOSIS — F330 Major depressive disorder, recurrent, mild: Secondary | ICD-10-CM

## 2020-04-27 LAB — POCT URINALYSIS DIPSTICK (MANUAL)
Nitrite, UA: NEGATIVE
Poct Bilirubin: NEGATIVE
Poct Blood: 50 — AB
Poct Glucose: NORMAL mg/dL
Poct Ketones: NEGATIVE
Poct Protein: NEGATIVE mg/dL
Poct Urobilinogen: 1 mg/dL — AB
Spec Grav, UA: <=1.005 — AB (ref 1.010–1.025)
pH, UA: 6 (ref 5.0–8.0)

## 2020-04-27 LAB — TSH: TSH: 0.97 mIU/L (ref 0.40–4.50)

## 2020-04-27 MED ORDER — CLONAZEPAM 0.125 MG PO TBDP: 0.1250 mg | ORAL_TABLET | Freq: Two times a day (BID) | ORAL | 0 refills | Status: DC

## 2020-04-27 NOTE — Patient Instructions (Addendum)
Psychiatrist advised CBT - please call insurance to find out where she can go in [place] to have it done    We know that starting treatment for an eating disorder can be difficult decision. At the [facility], we help you at every step. First, everyone seeking care with [place] receives a complete evaluation. We depend on the information we receive from you, from your family members, and your current therapists and doctors. Second, our treatment team meets to recommend a plan that will best meet your personal treatment needs. We accept most commercial insurance (BCBS, [place], [place], to name a few) and [address] and [place]. Our financial counselors are here to guide you through your payment and insurance options. Please complete an intake form so that we can learn more about you. Then fax it to our Intake Coordinator at [phone]. Questions? Please contact our Intake Coordinator, Kerry Dory, at [phone]. You can recover from an eating disorder and we can help.

## 2020-04-27 NOTE — Progress Notes (Signed)
Name: Maureen [name]   MRN: [id number]    DOB: 30-Aug-1946   Date:04/27/2020       Progress Note  Subjective  Chief Complaint  Chief Complaint  Patient presents with  . Hypotension  . Weight Loss    HPI  Malnutrition: severe, BMI is below 17 now, she has lost 20 pounds since Feb 2021. She has seen hematologist , gastroenterologist, psychiatrist. She continues to have difficulty and fear of swallowing. Now her bp is dropping, she denies dizziness. She has been able to keep fluids down. She has a Film/video editor and multiple recommendations for weight gain/high calorie diet but worries about becoming diabetic . Son is here with her and very frustrated, worried that she is not trying to get better. We will try to see if admission to try to feed her would help with weight gain. Suspecting eating disorder, advised son to contact [facility]    Hypothyroidism: she has been taking medication daily now, we will recheck TSH, son checks on her but we don't know for sure if she has been taking it, she denies hair loss, no change in bowel movements, she has dysphagia waiting to be seen by [facility], pending approval.   Urinary frequency: a couple of days ago, but resolved now, no fever or chills.   Neutropenia: seen by hematologist and normal evaluation, due for follow up 06/2020    Patient Active Problem List   Diagnosis Date Noted  . Paroxysmal atrial fibrillation ([place]) 03/22/2020  . Vitamin D deficiency 03/01/2020  . Esophageal dysphagia   . Arrhythmia 08/25/2019  . Rectal bleeding   . Low grade squamous intraepith lesion on cytologic smear cervix (lgsil) 08/04/2018  . Chronic venous insufficiency 07/05/2018  . Lymphedema 07/05/2018  . Hypertension 04/02/2018  . Swelling of limb 04/02/2018  . Depression, major, recurrent, mild ([place]) 12/29/2017  . Leukopenia 05/24/2017  . Allergic rhinitis, seasonal 06/29/2015  . Clavus 06/29/2015  . 1st degree AV block 06/29/2015  . Hammer toe 06/29/2015  . H/O:  HTN (hypertension) 06/29/2015  . Blood glucose elevated 06/29/2015  . Floater, vitreous 06/29/2015  . Bilateral tinnitus 06/29/2015  . Hypothyroidism (acquired) 04/02/2015  . Gastroesophageal reflux disease 04/02/2015  . Hammer toe of right foot 04/02/2015  . Scoliosis of thoracic spine 04/02/2015  . Urethral prolapse 04/02/2015  . Corn of toe 04/02/2015  . Hyperlipidemia 04/02/2015  . Varicose veins of both lower extremities 04/02/2015    Past Surgical History:  Procedure Laterality Date  . ABDOMINAL HYSTERECTOMY  1989  . APPENDECTOMY  1989  . BREAST EXCISIONAL BIOPSY Bilateral    multiple biopsies negative  . BREAST SURGERY     Several  . COLONOSCOPY WITH PROPOFOL N/A 07/28/2019   Procedure: COLONOSCOPY WITH PROPOFOL;  Surgeon: Lin Landsman, MD;  Location[facility] ENDOSCOPY;  Service: Gastroenterology;  Laterality: N/A;  . ESOPHAGOGASTRODUODENOSCOPY (EGD) WITH PROPOFOL N/A 11/11/2019   Procedure: ESOPHAGOGASTRODUODENOSCOPY (EGD) WITH PROPOFOL;  Surgeon: Lin Landsman, MD;  Location: [facility] ENDOSCOPY;  Service: Gastroenterology;  Laterality: N/A;  . TUMOR EXCISION  1989   same time as hysterectomy    Family History  Problem Relation Age of Onset  . Diabetes Father 23       DM complications  . Hypertension Mother 1       CKD  . Thyroid disease Mother   . Heart failure Mother   . Kidney disease Mother   . Depression Mother   . Breast cancer Sister 89  . Depression Sister   .  Kidney disease Other   . Breast cancer Maternal Aunt   . Breast cancer Cousin   . Anuerysm Son 11  . Diabetes Son     Social History   Tobacco Use  . Smoking status: Never Smoker  . Smokeless tobacco: Never Used  . Tobacco comment: smoking cessation materials not required  Substance Use Topics  . Alcohol use: Never    Alcohol/week: 0.0 standard drinks     Current Outpatient Medications:  .  Ascorbic Acid (VITAMIN C) 1000 MG tablet, Take 1,000 mg by mouth daily., Disp: , Rfl:  .   clonazepam (KLONOPIN) 0.125 MG disintegrating tablet, Take 1 tablet (0.125 mg total) by mouth 2 (two) times daily., Disp: 60 tablet, Rfl: 0 .  Garlic 4401 MG CAPS, Take 1 capsule by mouth daily. , Disp: , Rfl:  .  Iron-Vitamins (GERITOL COMPLETE PO), Take 1 tablet by mouth daily., Disp: , Rfl:  .  levothyroxine (SYNTHROID) 88 MCG tablet, Take 1 tablet (88 mcg total) by mouth daily before breakfast., Disp: 90 tablet, Rfl: 0 .  lidocaine (XYLOCAINE) 2 % solution, Use as directed 15 mLs in the mouth or throat every 6 (six) hours as needed for mouth pain., Disp: 100 mL, Rfl: 0 .  mirtazapine (REMERON) 15 MG tablet, 7.5 mg at night for two weeks, then 15 mg at night, Disp: 30 tablet, Rfl: 1 .  omeprazole (PRILOSEC) 40 MG capsule, Take 1 capsule (40 mg total) by mouth daily before breakfast., Disp: 90 capsule, Rfl: 3 .  TURMERIC CURCUMIN PO, Take 550 mg by mouth daily., Disp: , Rfl:   Allergies  Allergen Reactions  . Aspirin     Tinnitus  . Citalopram Other (See Comments)  . Codeine Nausea And Vomiting  . Penicillins Rash    Has patient had a PCN reaction causing immediate rash, facial/tongue/throat swelling, SOB or lightheadedness with hypotension: Yes Has patient had a PCN reaction causing severe rash involving mucus membranes or skin necrosis: No Has patient had a PCN reaction that required hospitalization No Has patient had a PCN reaction occurring within the last 10 years: No If all of the above answers are "NO", then may proceed with Cephalosporin use.    I personally reviewed active problem list, medication list, allergies, family history, social history, health maintenance with the patient/caregiver today.   ROS  Constitutional: Negative for fever , positive for  weight change.  Respiratory: Negative for cough and shortness of breath.   Cardiovascular: Negative for chest pain or palpitations.  Gastrointestinal: Negative for abdominal pain, no bowel changes.  Musculoskeletal:  Negative for gait problem or joint swelling.  Skin: Negative for rash.  Neurological: Negative for dizziness or headache.  No other specific complaints in a complete review of systems (except as listed in HPI above).  Objective  Vitals:   04/27/20 1035  BP: (!) 84/58  Pulse: 82  Resp: 12  Temp: 97.8 F (36.6 C)  TempSrc: Temporal  Weight: 86 lb 12.8 oz (39.4 kg)  Height: 5' 1"  (1.549 m)    Body mass index is 16.4 kg/m.  Physical Exam  Constitutional: Patient appears frail, malnourished, clothing too large for her size now. No distress.  HEENT: head atraumatic, normocephalic, pupils equal and reactive to light, neck supple Cardiovascular: Normal rate, regular rhythm and normal heart sounds.  No murmur heard. No BLE edema. Pulmonary/Chest: Effort normal and breath sounds normal. No respiratory distress. Abdominal: Soft.  There is no tenderness. Psychiatric: Patient has a normal mood and  affect. behavior is normal. Judgment and thought content normal.  Recent Results (from the past 2160 hour(s))  Folate     Status: None   Collection Time: 02/13/20  3:30 PM  Result Value Ref Range   Folate 13.5 >5.9 ng/mL    Comment: Performed at [facility], [address] [place], [place] [address]  Iron and TIBC     Status: None   Collection Time: 02/13/20  3:30 PM  Result Value Ref Range   Iron 53 28 - 170 ug/dL   TIBC 288 250 - 450 ug/dL   Saturation Ratios 18 10.4 - 31.8 %   UIBC 235 ug/dL    Comment: Performed at [facility], [address] [place], [place] [address]  Ferritin     Status: None   Collection Time: 02/13/20  3:30 PM  Result Value Ref Range   Ferritin 201 11 - 307 ng/mL    Comment: Performed at [facility], [place], [place] [address]  NEUTROPHIL AB TEST LEVEL 1     Status: None   Collection Time: 02/13/20  3:30 PM  Result Value Ref Range   NEUTROPHIL SCR/PANEL INTERP. Negative     Comment: (NOTE) Performed At: [facility] [place], [place] [id number] Rush Farmer MD QP:[id number]   Lactate dehydrogenase     Status: None   Collection Time: 02/13/20  3:30 PM  Result Value Ref Range   LDH 157 98 - 192 U/L    Comment: Performed at [facility], [address] [place], [place] [address]  CBC with Differential/Platelet     Status: Abnormal   Collection Time: 02/13/20  3:30 PM  Result Value Ref Range   WBC 2.5 (L) 4.0 - 10.5 K/uL   RBC 4.02 3.87 - 5.11 MIL/uL   Hemoglobin 12.4 12.0 - 15.0 g/dL   HCT 35.5 (L) 36 - 46 %   MCV 88.3 80.0 - 100.0 fL   MCH 30.8 26.0 - 34.0 pg   MCHC 34.9 30.0 - 36.0 g/dL   RDW 14.3 11.5 - 15.5 %   Platelets 179 150 - 400 K/uL   nRBC 0.0 0.0 - 0.2 %   Neutrophils Relative % 33 %   Neutro Abs 0.8 (L) 1.7 - 7.7 K/uL   Lymphocytes Relative 50 %   Lymphs Abs 1.2 0.7 - 4.0 K/uL   Monocytes Relative 14 %   Monocytes Absolute 0.4 0 - 1 K/uL   Eosinophils Relative 2 %   Eosinophils Absolute 0.0 0 - 0 K/uL   Basophils Relative 1 %   Basophils Absolute 0.0 0 - 0 K/uL   Immature Granulocytes 0 %   Abs Immature Granulocytes 0.00 0.00 - 0.07 K/uL    Comment: Performed at [facility], [address] [place], [place] [address]  Flow cytometry panel-leukemia/lymphoma work-up     Status: None   Collection Time: 02/13/20  3:30 PM  Result Value Ref Range   PATH INTERP XXX-IMP Comment     Comment: No significant immunophenotypic abnormality detected   CLINICAL INFO Comment     Comment: (NOTE) Accompanying CBC dated 02-13-20 shows: WBC count 2.5, Neu 0.8, Lym 1.2, Mon 0.4.    Specimen Type Comment     Comment: Peripheral blood   ASSESSMENT OF LEUKOCYTES Comment     Comment: (NOTE) No monoclonal B cell population is detected. kappa:lambda ratio 2.0 There is no loss of, or aberrant expression of, the pan T  cell antigens to suggest a neoplastic T cell process. CD4:CD8 ratio 1.5 CD57 positive cells are increased but are composed of a  mixture of CD4 and CD8 positive T cells and NK cells, most consistent with a reactive process. No circulating blasts are detected. There is no immunophenotypic  evidence of abnormal myeloid maturation. Analysis of the leukocyte population shows: granulocytes 34%, monocytes 7%, lymphocytes 59%, blasts <0.1%, B cells 12%, T cells 29%, LGLs 18%, NK cells 19%.    % Viable Cells Comment     Comment: 96%   ANALYSIS AND GATING STRATEGY Comment     Comment: 8 color analysis with CD45/SSC   IMMUNOPHENOTYPING STUDY Comment     Comment: (NOTE) CD2       Normal         CD3       Normal CD4       Normal         CD5       Normal CD7       Normal         CD8       Normal CD10      Normal         CD11b     Normal CD13      Normal         CD14      Normal CD16      Normal         CD19      Normal CD20      Normal         CD33      Normal CD34      Normal         CD38      Normal CD45      Normal         CD56      Normal CD57      Normal         CD117     Normal HLA-DR    Normal         KAPPA     Normal LAMBDA    Normal         CD64      Normal    PATHOLOGIST NAME Comment     Comment: Henrietta Hoover, M.D.   COMMENT: Comment     Comment: (NOTE) Each antibody in this assay was utilized to assess for potential abnormalities of studied cell populations or to characterize identified abnormalities. This test was developed and its performance characteristics determined by [facility].  It has not been cleared or approved by the U.S. Food and Drug Administration. The FDA has determined that such clearance or approval is not necessary. This test is used for clinical purposes.  It should not be regarded as investigational or for research. Performed At: -[facility] [address] [place], [place] [id number] Katina Degree MDPhD LK:[id number] Performed At: [facility] [address] [place], [place] [id number] Katina Degree MDPhD VZ:[id number]   TSH     Status: Abnormal   Collection Time:  03/15/20 10:39 AM  Result Value Ref Range   TSH 12.52 (H) 0.40 - 4.50 mIU/L      PHQ2/9: Depression screen [facility] 2/9 04/27/2020 03/22/2020 03/15/2020 01/20/2020 12/22/2019  Decreased Interest 2 3 2 2 3   Down, Depressed, Hopeless 2 3 1 1 1   PHQ - 2 Score 4 6 3 3 4   Altered sleeping 2 3 2  0 0  Tired, decreased energy 2 3 2 1 2   Change in appetite 3 3 2 3 3   Feeling bad or failure about yourself  0 3 1 3 3   Trouble concentrating 1 3 1  0 0  Moving slowly or fidgety/restless 1 3 2  0 3  Suicidal thoughts 0 0 0 1 0  PHQ-9 Score 13 24 13 11 15   Difficult doing work/chores Somewhat difficult Very difficult Somewhat difficult Very difficult Somewhat difficult  Some recent data might be hidden    phq 9 is positive and negative   Fall Risk: Fall Risk  04/27/2020 03/22/2020 03/15/2020 11/30/2019 10/20/2019  Falls in the past year? 0 0 0 0 0  Number falls in past yr: 0 0 0 0 0  Injury with Fall? 0 0 0 0 -  Risk for fall due to : - - - - -  Risk for fall due to: Comment - - - - -  Follow up - Falls evaluation completed - - Falls evaluation completed    Functional Status Survey: Is the patient deaf or have difficulty hearing?: No Does the patient have difficulty seeing, even when wearing glasses/contacts?: No Does the patient have difficulty concentrating, remembering, or making decisions?: Yes Does the patient have difficulty walking or climbing stairs?: No Does the patient have difficulty dressing or bathing?: No Does the patient have difficulty doing errands alone such as visiting a doctor's office or shopping?: Yes    Assessment & Plan  1. Hypothyroidism (acquired)  - TSH  2. Severe protein-energy malnutrition ([place])  Needs to consider seeing [facility] eating disorder center   3. Oropharyngeal dysphagia  Pending second opinion from [facility]  4. Mild episode of recurrent major depressive disorder [facility])  Seeing psychiatrist, taking remeron  5. Other neutropenia ([place])  Continue follow  up with hematologist   6. Urinary frequency  - Urine Culture - POCT Urinalysis Dip Manual   7. Other specified hypotension   8. Avoidant-restrictive food intake disorder (ARFID)  We will try to refer to [facility] eating disorder center   9. Anxiety  - clonazepam (KLONOPIN) 0.125 MG disintegrating tablet; Take 1 tablet (0.125 mg total) by mouth 2 (two) times daily.  Dispense: 60 tablet; Refill: 0

## 2020-04-28 LAB — URINE CULTURE
MICRO NUMBER:: 10664299
Result:: NO GROWTH
SPECIMEN QUALITY:: ADEQUATE

## 2020-04-30 ENCOUNTER — Encounter: Payer: Self-pay | Admitting: Physician Assistant

## 2020-04-30 ENCOUNTER — Emergency Department
Admission: EM | Admit: 2020-04-30 | Discharge: 2020-04-30 | Disposition: A | Discharge status: Home / Self Care | Payer: Medicare HMO | Attending: Emergency Medicine | Admitting: Emergency Medicine

## 2020-04-30 ENCOUNTER — Other Ambulatory Visit: Payer: Self-pay

## 2020-04-30 DIAGNOSIS — S61207A Unspecified open wound of left little finger without damage to nail, initial encounter: Secondary | ICD-10-CM | POA: Insufficient documentation

## 2020-04-30 DIAGNOSIS — Y999 Unspecified external cause status: Secondary | ICD-10-CM | POA: Insufficient documentation

## 2020-04-30 DIAGNOSIS — I10 Essential (primary) hypertension: Secondary | ICD-10-CM | POA: Diagnosis not present

## 2020-04-30 DIAGNOSIS — Y929 Unspecified place or not applicable: Secondary | ICD-10-CM | POA: Insufficient documentation

## 2020-04-30 DIAGNOSIS — S61209A Unspecified open wound of unspecified finger without damage to nail, initial encounter: Secondary | ICD-10-CM

## 2020-04-30 DIAGNOSIS — E039 Hypothyroidism, unspecified: Secondary | ICD-10-CM | POA: Insufficient documentation

## 2020-04-30 DIAGNOSIS — Y939 Activity, unspecified: Secondary | ICD-10-CM | POA: Insufficient documentation

## 2020-04-30 DIAGNOSIS — W268XXA Contact with other sharp object(s), not elsewhere classified, initial encounter: Secondary | ICD-10-CM | POA: Diagnosis not present

## 2020-04-30 DIAGNOSIS — S61217A Laceration without foreign body of left little finger without damage to nail, initial encounter: Secondary | ICD-10-CM | POA: Diagnosis not present

## 2020-04-30 NOTE — Discharge Instructions (Signed)
We repaired the skin avulsion with Dermabond. The bleeding is now controlled. Do NOT put any lotion, oils, creams, or ointments over the wound glue. The glue will crack and peel off in a few days.

## 2020-04-30 NOTE — ED Provider Notes (Signed)
[facility] Emergency Department Provider Note ____________________________________________  Time seen: 1918    I have reviewed the triage vital signs and the nursing notes.  HISTORY  Chief Complaint  Finger Injury  HPI Maureen [name] is a 74 y.o. female presents to the ED for evaluation of an accidental shave laceration to the left pinky finger.   Patient describes her grandson was cutting her nails, and excellently clipped the distal end of her finger while clipping her nails.  Patient and family have been unable to get the wound to stop oozing blood.  Dressing is in place and minimal bleeding at this time peer patient denies any blood thinner use patient denies any other injury at this time.  Past Medical History:  Diagnosis Date  . Allergy   . Corn of toe   . First degree AV block   . GERD (gastroesophageal reflux disease)   . Hammer toe of right foot   . Hyperglycemia   . Hyperlipidemia   . Hypertension   . Loss of appetite   . Prolapse of urethra   . Scoliosis (and kyphoscoliosis), idiopathic   . Thyroid disease   . Tinnitus of both ears   . Vitreous floaters of right eye     Patient Active Problem List   Diagnosis Date Noted  . Paroxysmal atrial fibrillation ([facility]) 03/22/2020  . Vitamin D deficiency 03/01/2020  . Esophageal dysphagia   . Arrhythmia 08/25/2019  . Rectal bleeding   . Low grade squamous intraepith lesion on cytologic smear cervix (lgsil) 08/04/2018  . Chronic venous insufficiency 07/05/2018  . Lymphedema 07/05/2018  . Hypertension 04/02/2018  . Swelling of limb 04/02/2018  . Depression, major, recurrent, mild ([facility]) 12/29/2017  . Leukopenia 05/24/2017  . Allergic rhinitis, seasonal 06/29/2015  . Clavus 06/29/2015  . 1st degree AV block 06/29/2015  . Hammer toe 06/29/2015  . H/O: HTN (hypertension) 06/29/2015  . Blood glucose elevated 06/29/2015  . Floater, vitreous 06/29/2015  . Bilateral tinnitus 06/29/2015  .  Hypothyroidism (acquired) 04/02/2015  . Gastroesophageal reflux disease 04/02/2015  . Hammer toe of right foot 04/02/2015  . Scoliosis of thoracic spine 04/02/2015  . Urethral prolapse 04/02/2015  . Corn of toe 04/02/2015  . Hyperlipidemia 04/02/2015  . Varicose veins of both lower extremities 04/02/2015    Past Surgical History:  Procedure Laterality Date  . ABDOMINAL HYSTERECTOMY  1989  . APPENDECTOMY  1989  . BREAST EXCISIONAL BIOPSY Bilateral    multiple biopsies negative  . BREAST SURGERY     Several  . COLONOSCOPY WITH PROPOFOL N/A 07/28/2019   Procedure: COLONOSCOPY WITH PROPOFOL;  Surgeon: Toney Reil, MD;  Location: [facility] ENDOSCOPY;  Service: Gastroenterology;  Laterality: N/A;  . ESOPHAGOGASTRODUODENOSCOPY (EGD) WITH PROPOFOL N/A 11/11/2019   Procedure: ESOPHAGOGASTRODUODENOSCOPY (EGD) WITH PROPOFOL;  Surgeon: Toney Reil, MD;  Location: [facility] ENDOSCOPY;  Service: Gastroenterology;  Laterality: N/A;  . TUMOR EXCISION  1989   same time as hysterectomy    Prior to Admission medications   Medication Sig Start Date End Date Taking? Authorizing Provider  Ascorbic Acid (VITAMIN C) 1000 MG tablet Take 1,000 mg by mouth daily.    [provider]  clonazepam (KLONOPIN) 0.125 MG disintegrating tablet Take 1 tablet (0.125 mg total) by mouth 2 (two) times daily. 04/27/20   Alba Cory, MD  Garlic 1000 MG CAPS Take 1 capsule by mouth daily.     [provider]  Iron-Vitamins (GERITOL COMPLETE PO) Take 1 tablet by mouth daily.  [provider]  levothyroxine (SYNTHROID) 88 MCG tablet Take 1 tablet (88 mcg total) by mouth daily before breakfast. 03/22/20   Carlynn Purl, Danna Hefty, MD  lidocaine (XYLOCAINE) 2 % solution Use as directed 15 mLs in the mouth or throat every 6 (six) hours as needed for mouth pain. 12/20/19   Alba Cory, MD  mirtazapine (REMERON) 15 MG tablet 7.5 mg at night for two weeks, then 15 mg at night 02/02/20   Neysa Hotter, MD   omeprazole (PRILOSEC) 40 MG capsule Take 1 capsule (40 mg total) by mouth daily before breakfast. 11/01/19   Vanga, Loel Dubonnet, MD  TURMERIC CURCUMIN PO Take 550 mg by mouth daily.    [provider]    Allergies Aspirin, Citalopram, Codeine, and Penicillins  Family History  Problem Relation Age of Onset  . Diabetes Father 72       DM complications  . Hypertension Mother 80       CKD  . Thyroid disease Mother   . Heart failure Mother   . Kidney disease Mother   . Depression Mother   . Breast cancer Sister 50  . Depression Sister   . Kidney disease Other   . Breast cancer Maternal Aunt   . Breast cancer Cousin   . Anuerysm Son 51  . Diabetes Son     Social History Social History   Tobacco Use  . Smoking status: Never Smoker  . Smokeless tobacco: Never Used  . Tobacco comment: smoking cessation materials not required  Vaping Use  . Vaping Use: Never used  Substance Use Topics  . Alcohol use: Never    Alcohol/week: 0.0 standard drinks  . Drug use: No    Review of Systems  Constitutional: Negative for fever. Cardiovascular: Negative for chest pain. Respiratory: Negative for shortness of breath. Musculoskeletal: Negative for back pain. Skin: Negative for rash.  Right pinky laceration as above. Neurological: Negative for headaches, focal weakness or numbness. ____________________________________________  PHYSICAL EXAM:  VITAL SIGNS: ED Triage Vitals  Enc Vitals Group     BP 04/30/20 1856 (!) 114/93     Pulse Rate 04/30/20 1856 86     Resp 04/30/20 1856 16     Temp 04/30/20 1856 98 F (36.7 C)     Temp Source 04/30/20 1856 Oral     SpO2 04/30/20 1856 100 %     Weight 04/30/20 1854 86 lb 12.8 oz (39.4 kg)     Height 04/30/20 1854 5\' 1"  (1.549 m)     Head Circumference --      Peak Flow --      Pain Score 04/30/20 1853 0     Pain Loc --      Pain Edu? --      Excl. in GC? --     Constitutional: Alert and oriented. Well appearing and in no  distress. Head: Normocephalic and atraumatic. Eyes: Conjunctivae are normal. Normal extraocular movements Cardiovascular: Normal rate, regular rhythm. Normal distal pulses. Respiratory: Normal respiratory effort.  Musculoskeletal: normal composite fist. Nontender with normal range of motion in all extremities.  Neurologic:  Normal gross sensation. Normal speech and language. No gross focal neurologic deficits are appreciated. Skin:  Skin is warm, dry and intact. No rash noted. Right pinky with a small, superficial shave laceration of the epidermal skin. Minimally bleeding wound bed noted.  Psychiatric: Mood and affect are normal. Patient exhibits appropriate insight and judgment. ____________________________________________  PROCEDURES  .09/05/21Laceration Repair  Date/Time: 04/30/2020 7:26 PM Performed by:  Anyae Griffith, Charlesetta Ivory, PA-C Authorized by: Lissa Hoard, PA-C   Consent:    Consent obtained:  Verbal   Consent given by:  Patient   Risks discussed:  Poor wound healing   Alternatives discussed:  No treatment Anesthesia (see MAR for exact dosages):    Anesthesia method:  None Laceration details:    Location:  Finger   Finger location:  R small finger   Length (cm):  0.3   Depth (mm):  2 Repair type:    Repair type:  Simple Exploration:    Hemostasis achieved with:  Direct pressure   Contaminated: no   Treatment:    Area cleansed with:  Saline   Amount of cleaning:  Standard Skin repair:    Repair method:  Tissue adhesive Post-procedure details:    Dressing:  Non-adherent dressing   Patient tolerance of procedure:  Tolerated well, no immediate complications   ____________________________________________  INITIAL IMPRESSION / ASSESSMENT AND PLAN / ED COURSE  Geriatric patient with ED evaluation of an accidental shave laceration to the right pinky.  The wound is prepared with Dermabond and hemostasis is achieved.  A bandage is applied and the wound is rechecked  after about 45 minutes.  Patient is discharged with instructions to keep the wound clean, dry, and covered.  She will follow with primary provider return to the ED as necessary.  Maureen [name] was evaluated in Emergency Department on 04/30/2020 for the symptoms described in the history of present illness. She was evaluated in the context of the global COVID-19 pandemic, which necessitated consideration that the patient might be at risk for infection with the SARS-CoV-2 virus that causes COVID-19. Institutional protocols and algorithms that pertain to the evaluation of patients at risk for COVID-19 are in a state of rapid change based on information released by regulatory bodies including the CDC and federal and state organizations. These policies and algorithms were followed during the patient's care in the ED. ____________________________________________  FINAL CLINICAL IMPRESSION(S) / ED DIAGNOSES  Final diagnoses:  Avulsion of skin of finger, initial encounter      Lissa Hoard, PA-C 04/30/20 2333    Shaune Pollack, MD 05/01/20 1614

## 2020-04-30 NOTE — ED Triage Notes (Signed)
Grandson was cutting pt nails and clipped a piece of skin on right hand 5th digit.  Dressing placed. Minimal to no bleeding at this time. Denies blood thinners.

## 2020-05-01 ENCOUNTER — Telehealth: Payer: Self-pay | Admitting: Family Medicine

## 2020-05-01 ENCOUNTER — Other Ambulatory Visit: Payer: Self-pay | Admitting: Family Medicine

## 2020-05-01 DIAGNOSIS — R1312 Dysphagia, oropharyngeal phase: Secondary | ICD-10-CM | POA: Diagnosis not present

## 2020-05-01 DIAGNOSIS — E039 Hypothyroidism, unspecified: Secondary | ICD-10-CM

## 2020-05-01 DIAGNOSIS — E440 Moderate protein-calorie malnutrition: Secondary | ICD-10-CM | POA: Diagnosis not present

## 2020-05-01 DIAGNOSIS — E559 Vitamin D deficiency, unspecified: Secondary | ICD-10-CM | POA: Diagnosis not present

## 2020-05-01 DIAGNOSIS — F419 Anxiety disorder, unspecified: Secondary | ICD-10-CM | POA: Diagnosis not present

## 2020-05-01 DIAGNOSIS — I480 Paroxysmal atrial fibrillation: Secondary | ICD-10-CM | POA: Diagnosis not present

## 2020-05-01 DIAGNOSIS — F330 Major depressive disorder, recurrent, mild: Secondary | ICD-10-CM | POA: Diagnosis not present

## 2020-05-01 DIAGNOSIS — E785 Hyperlipidemia, unspecified: Secondary | ICD-10-CM | POA: Diagnosis not present

## 2020-05-01 DIAGNOSIS — I10 Essential (primary) hypertension: Secondary | ICD-10-CM | POA: Diagnosis not present

## 2020-05-01 MED ORDER — LEVOTHYROXINE SODIUM 88 MCG PO TABS: 88.0000 ug | ORAL_TABLET | Freq: Every day | ORAL | 0 refills | Status: DC

## 2020-05-01 NOTE — Telephone Encounter (Signed)
Per initial encounter, "[employer] representative  Would like to know if the Omeprazole dosage can be increased from 1 tablet to 2 a day.please advise"; the pt is seen by Dr Carlynn Purl, [facility]; will route to office for final disposition.

## 2020-05-01 NOTE — Telephone Encounter (Signed)
[employer] representative  Would like to know if the Omeprazole dosage can be increased from 1 tablet to 2 a day.please advise

## 2020-05-02 NOTE — Telephone Encounter (Signed)
Called and left a vm in regards to medication change.

## 2020-05-03 ENCOUNTER — Telehealth (HOSPITAL_COMMUNITY): Payer: Medicare HMO | Admitting: Psychiatry

## 2020-05-03 ENCOUNTER — Telehealth (HOSPITAL_COMMUNITY): Payer: Self-pay | Admitting: Psychiatry

## 2020-05-03 ENCOUNTER — Other Ambulatory Visit: Payer: Self-pay

## 2020-05-03 NOTE — Telephone Encounter (Signed)
Patients emergency contact called to advise he received appt info late and would not be able to assist the patient with the appt and needed to reschedule, I advised it was 11am and the appt was 11am so it was okay he firmly stated I said I got the information late and need to reschedule the appt. Appt resheduled.

## 2020-05-07 ENCOUNTER — Telehealth: Payer: Self-pay | Admitting: Family Medicine

## 2020-05-07 NOTE — Telephone Encounter (Signed)
Pt son rodney s calling and would like a referral to skilled nursing [facility] in [place] for his mom. Pt is losing weight and son would like assistant with his mom to regain weight pt weighs less than 100 pounds. Pt ins [facility]  will pay for 20 day in house at facility per son

## 2020-05-07 NOTE — Telephone Encounter (Signed)
I do not know this patient, patient of Dr. Carlynn Purl but ok for verbal to have that referral as noted. If patient is losing weight unintentionally, a provider evaluation is indicated, either as soon as Dr. Carlynn Purl returns if preferred, or sooner prn.

## 2020-05-07 NOTE — Telephone Encounter (Signed)
Called Maureen [name] patient's son to see if this was urgent. He said it is urgent because his mom has lost 40-50 lbs since November. I did explain to him that Dr. Carlynn Purl was out of the office, but he wants a referral to skilled nursing [facility] for weight loss as soon as possible.

## 2020-05-08 DIAGNOSIS — I480 Paroxysmal atrial fibrillation: Secondary | ICD-10-CM | POA: Diagnosis not present

## 2020-05-08 DIAGNOSIS — R1312 Dysphagia, oropharyngeal phase: Secondary | ICD-10-CM | POA: Diagnosis not present

## 2020-05-08 DIAGNOSIS — I10 Essential (primary) hypertension: Secondary | ICD-10-CM | POA: Diagnosis not present

## 2020-05-08 DIAGNOSIS — E039 Hypothyroidism, unspecified: Secondary | ICD-10-CM | POA: Diagnosis not present

## 2020-05-08 DIAGNOSIS — E440 Moderate protein-calorie malnutrition: Secondary | ICD-10-CM | POA: Diagnosis not present

## 2020-05-08 DIAGNOSIS — E785 Hyperlipidemia, unspecified: Secondary | ICD-10-CM | POA: Diagnosis not present

## 2020-05-08 DIAGNOSIS — F419 Anxiety disorder, unspecified: Secondary | ICD-10-CM | POA: Diagnosis not present

## 2020-05-08 DIAGNOSIS — E559 Vitamin D deficiency, unspecified: Secondary | ICD-10-CM | POA: Diagnosis not present

## 2020-05-08 DIAGNOSIS — F330 Major depressive disorder, recurrent, mild: Secondary | ICD-10-CM | POA: Diagnosis not present

## 2020-05-08 NOTE — Telephone Encounter (Signed)
[name] wants a referral.

## 2020-05-09 NOTE — Telephone Encounter (Signed)
I can be submitted online via their portal but the questions below has to be answered.  Is this for skilled rehab, long term care, home care or hospice?  Dx?  Specific orders?

## 2020-05-10 DIAGNOSIS — F330 Major depressive disorder, recurrent, mild: Secondary | ICD-10-CM | POA: Diagnosis not present

## 2020-05-10 DIAGNOSIS — F419 Anxiety disorder, unspecified: Secondary | ICD-10-CM | POA: Diagnosis not present

## 2020-05-10 DIAGNOSIS — E440 Moderate protein-calorie malnutrition: Secondary | ICD-10-CM | POA: Diagnosis not present

## 2020-05-10 DIAGNOSIS — E039 Hypothyroidism, unspecified: Secondary | ICD-10-CM | POA: Diagnosis not present

## 2020-05-10 DIAGNOSIS — E559 Vitamin D deficiency, unspecified: Secondary | ICD-10-CM | POA: Diagnosis not present

## 2020-05-10 DIAGNOSIS — E785 Hyperlipidemia, unspecified: Secondary | ICD-10-CM | POA: Diagnosis not present

## 2020-05-10 DIAGNOSIS — I480 Paroxysmal atrial fibrillation: Secondary | ICD-10-CM | POA: Diagnosis not present

## 2020-05-10 DIAGNOSIS — R1312 Dysphagia, oropharyngeal phase: Secondary | ICD-10-CM | POA: Diagnosis not present

## 2020-05-10 DIAGNOSIS — I10 Essential (primary) hypertension: Secondary | ICD-10-CM | POA: Diagnosis not present

## 2020-05-10 NOTE — Telephone Encounter (Signed)
Information has been submitted as requested but I gave them Maureen [name]'s information for the f/u due to a conflict of interest. Maureen [name] has been informed.

## 2020-05-10 NOTE — Progress Notes (Deleted)
BH MD/PA/NP OP Progress Note  05/10/2020 9:49 AM Maureen [name]  MRN:  [id number]  Chief Complaint:  HPI:  Nursing home referral  Visit Diagnosis: No diagnosis found.  Past Psychiatric History: Please see initial evaluation for full details. I have reviewed the history. No updates at this time.     Past Medical History:  Past Medical History:  Diagnosis Date  . Allergy   . Corn of toe   . First degree AV block   . GERD (gastroesophageal reflux disease)   . Hammer toe of right foot   . Hyperglycemia   . Hyperlipidemia   . Hypertension   . Loss of appetite   . Prolapse of urethra   . Scoliosis (and kyphoscoliosis), idiopathic   . Thyroid disease   . Tinnitus of both ears   . Vitreous floaters of right eye     Past Surgical History:  Procedure Laterality Date  . ABDOMINAL HYSTERECTOMY  1989  . APPENDECTOMY  1989  . BREAST EXCISIONAL BIOPSY Bilateral    multiple biopsies negative  . BREAST SURGERY     Several  . COLONOSCOPY WITH PROPOFOL N/A 07/28/2019   Procedure: COLONOSCOPY WITH PROPOFOL;  Surgeon: Toney Reil, MD;  Location[facility] END[facility]COPY;  Service: Gastroenterology;  Laterality: N/A;  . ESOPHAGOGASTRODUODENOSCOPY (EGD) WITH PROPOFOL N/A 11/11/2019   Procedure: ESOPHAGOGASTRODUODENOSCOPY (EGD) WITH PROPOFOL;  Surgeon: Toney Reil, MD;  Location: [facility] END[facility];  Service: Gastroenterology;  Laterality: N/A;  . TUMOR EXCISION  1989   same time as hysterectomy    Family Psychiatric History: Please see initial evaluation for full details. I have reviewed the history. No updates at this time.     Family History:  Family History  Problem Relation Age of Onset  . Diabetes Father 5       DM complications  . Hypertension Mother 5       CKD  . Thyroid disease Mother   . Heart failure Mother   . Kidney disease Mother   . Depression Mother   . Breast cancer Sister 10  . Depression Sister   . Kidney disease Other   . Breast cancer Maternal Aunt    . Breast cancer Cousin   . Anuerysm Son 51  . Diabetes Son     Social History:  Social History   Socioeconomic History  . Marital status: Widowed    Spouse name: Not on file  . Number of children: 3  . Years of education: 7  . Highest education level: 12th grade  Occupational History  . Occupation: Retired  Tobacco Use  . Smoking status: Never Smoker  . Smokeless tobacco: Never Used  . Tobacco comment: smoking cessation materials not required  Vaping Use  . Vaping Use: Never used  Substance and Sexual Activity  . Alcohol use: Never    Alcohol/week: 0.0 standard drinks  . Drug use: No  . Sexual activity: Not Currently    Partners: Male  Other Topics Concern  . Not on file  Social History Narrative  . Not on file   Social Determinants of Health   Financial Resource Strain:   . Difficulty of Paying Living Expenses:   Food Insecurity:   . Worried About Programme researcher, broadcasting/film/video in the Last Year:   . Barista in the Last Year:   Transportation Needs:   . Freight forwarder (Medical):   Marland Kitchen Lack of Transportation (Non-Medical):   Physical Activity: Unknown  . Days of Exercise  per Week: 3 days  . Minutes of Exercise per Session: Not on file  Stress:   . Feeling of Stress :   Social Connections:   . Frequency of Communication with Friends and Family:   . Frequency of Social Gatherings with Friends and Family:   . Attends Religious Services:   . Active Member of Clubs or Organizations:   . Attends Banker Meetings:   Marland Kitchen Marital Status:     Allergies:  Allergies  Allergen Reactions  . Aspirin     Tinnitus  . Citalopram Other (See Comments)  . Codeine Nausea And Vomiting  . Penicillins Rash    Has patient had a PCN reaction causing immediate rash, facial/tongue/throat swelling, SOB or lightheadedness with hypotension: Yes Has patient had a PCN reaction causing severe rash involving mucus membranes or skin necrosis: No Has patient had a PCN  reaction that required hospitalization No Has patient had a PCN reaction occurring within the last 10 years: No If all of the above answers are "NO", then may proceed with Cephalosporin use.    Metabolic Disorder Labs: Lab Results  Component Value Date   HGBA1C 5.9 (H) 06/17/2019   MPG 123 06/17/2019   MPG 126 09/30/2018   No results found for: PROLACTIN Lab Results  Component Value Date   CHOL 179 06/17/2019   TRIG 64 06/17/2019   HDL 45 (L) 06/17/2019   CHOLHDL 4.0 06/17/2019   VLDL 17 01/20/2017   LDLCALC 118 (H) 06/17/2019   LDLCALC 136 (H) 01/08/2018   Lab Results  Component Value Date   TSH 0.97 04/27/2020   TSH 12.52 (H) 03/15/2020    Therapeutic Level Labs: No results found for: LITHIUM No results found for: VALPROATE No components found for:  CBMZ  Current Medications: Current Outpatient Medications  Medication Sig Dispense Refill  . Ascorbic Acid (VITAMIN C) 1000 MG tablet Take 1,000 mg by mouth daily.    . clonazepam (KLONOPIN) 0.125 MG disintegrating tablet Take 1 tablet (0.125 mg total) by mouth 2 (two) times daily. 60 tablet 0  . Garlic 1000 MG CAPS Take 1 capsule by mouth daily.     . Iron-Vitamins (GERITOL COMPLETE PO) Take 1 tablet by mouth daily.    Marland Kitchen levothyroxine (SYNTHROID) 88 MCG tablet Take 1 tablet (88 mcg total) by mouth daily before breakfast. 90 tablet 0  . lidocaine (XYLOCAINE) 2 % solution Use as directed 15 mLs in the mouth or throat every 6 (six) hours as needed for mouth pain. 100 mL 0  . mirtazapine (REMERON) 15 MG tablet 7.5 mg at night for two weeks, then 15 mg at night 30 tablet 1  . omeprazole (PRILOSEC) 40 MG capsule Take 1 capsule (40 mg total) by mouth daily before breakfast. 90 capsule 3  . TURMERIC CURCUMIN PO Take 550 mg by mouth daily.     No current facility-administered medications for this visit.     Musculoskeletal: Strength & Muscle Tone: N/A Gait & Station: N/A Patient leans: N/A  Psychiatric Specialty  Exam: Review of Systems  There were no vitals taken for this visit.There is no height or weight on file to calculate BMI.  General Appearance: {Appearance:22683}  Eye Contact:  {BHH EYE CONTACT:22684}  Speech:  Clear and Coherent  Volume:  Normal  Mood:  {BHH MOOD:22306}  Affect:  {Affect (PAA):22687}  Thought Process:  Coherent  Orientation:  Full (Time, Place, and Person)  Thought Content: Logical   Suicidal Thoughts:  {ST/HT (PAA):22692}  Homicidal  Thoughts:  {ST/HT (PAA):22692}  Memory:  Immediate;   Good  Judgement:  {Judgement (PAA):22694}  Insight:  {Insight (PAA):22695}  Psychomotor Activity:  Normal  Concentration:  Concentration: Good and Attention Span: Good  Recall:  Good  Fund of Knowledge: Good  Language: Good  Akathisia:  No  Handed:  Right  AIMS (if indicated): not done  Assets:  Communication Skills Desire for Improvement  ADL's:  Intact  Cognition: WNL  Sleep:  {BHH GOOD/FAIR/POOR:22877}   Screenings: GAD-7     Office Visit from 04/27/2020 in [facility] Office Visit from 12/22/2019 in [facility] Office Visit from 11/30/2019 in [facility] Office Visit from 01/11/2019 in [facility] Office Visit from 09/30/2018 in [facility]  Total GAD-7 Score 5 12 7 3 2     PHQ2-9     Office Visit from 04/27/2020 in [facility] Office Visit from 03[facility]22/2020 in [facility] Office Visit from 03/15/2020 in [facility] Office Visit from 01/20/2020 in [facility] Office Visit from 12/22/2019 in [facility]  PHQ-2 Total Score 4 6 3 3 4   PHQ-9 Total Score 13 24 13 11 15        Assessment and Plan:  Maureen [name] is a 73 y.o. year old female with a history of depression,malnutrition, hypothyroidism , who presents for follow up appointment for below.     1. Current mild episode of major  depressive disorder without prior episode (HCC) 2. Anxiety disorder, unspecified type She continues to report depressive symptoms with anxiety, fear of choking since the last visit, and has had weight loss in the context of non adherence to levothyroxine.  She has not started mirtazapine which was discussed at the previous visit. The patient and her son were both informed to start this medication to target depression, anxiety and appetite loss.  Discussed the importance of adherence to medication.  Will continue clonazepam as needed for anxiety, which has been prescribed by PCP.  Although this medication would be preferred to avoid risk of fall, her son reports great benefit from this medication. Although she will greatly benefit from CBT, no therapist is currently available at [facility]. Will consider referral to outside if the patient is amenable. Noted that she has been nonadherent to levothyroxine, although she is now agreeable to reinitiate this medication.   # r/o mild neurocognitive disorder Although she is alert and oriented during the interview, her son reports a few episodes of confusionin the past.ADL independent,and no safety concern.Will continue to assess.  Plan  1. Start mirtazapine7.5 mg at night for two weeks, then 15 mg at night 2. Continue clonazepam 0.125 mg twice a day as needed for anxiety 3. Next appointment: 7/8 at 11 AM for 30 mins, video - She has an appointment with speech therapy at [facility]  The patient demonstrates the following risk factors for suicide: Chronic risk factors for suicide include:N/A. Acute risk factorsfor suicide include: unemployment. Protective factorsfor this patient include: positive social support. Considering these factors, the overall suicide risk at this point appears to below. Patientisappropriate for outpatient follow up.   66, MD 05/10/2020, 9:49 AM

## 2020-05-14 ENCOUNTER — Ambulatory Visit: Payer: Self-pay

## 2020-05-14 ENCOUNTER — Telehealth: Payer: Medicare HMO

## 2020-05-14 NOTE — Chronic Care Management (AMB) (Signed)
  Chronic Care Management   Outreach Note  05/14/2020 Name: Maureen [name] MRN: [id number] DOB: January 11, 1946  Primary Care Provider: Alba Cory, MD Reason for referral : Chronic Care Management   An unsuccessful telephone outreach was attempted today. Maureen [name] was referred to the case management team for assistance with care management and care coordination.     Follow Up Plan: The care management team will reach out to Maureen [name] again within the next two to three weeks.   [facility] Care Management [phone]

## 2020-05-15 ENCOUNTER — Telehealth (HOSPITAL_COMMUNITY): Payer: Self-pay | Admitting: Psychiatry

## 2020-05-15 ENCOUNTER — Other Ambulatory Visit: Payer: Self-pay

## 2020-05-15 ENCOUNTER — Telehealth (HOSPITAL_COMMUNITY): Payer: Medicare HMO | Admitting: Psychiatry

## 2020-05-15 NOTE — Telephone Encounter (Signed)
Sent link for video visit through Epic. Patient did not sign in. Called the patient  for appointment scheduled today. The patient did not answer the phone. Left voice message to contact the office.  

## 2020-05-16 ENCOUNTER — Telehealth (HOSPITAL_COMMUNITY): Payer: Self-pay | Admitting: Psychiatry

## 2020-05-16 NOTE — Telephone Encounter (Signed)
Left voicemail advising patient of no show policy, adding that if another no show the patient can be dismissed from practice. Did advise to call with any questions or concerns.

## 2020-05-29 ENCOUNTER — Encounter: Payer: Self-pay | Admitting: Family Medicine

## 2020-05-29 ENCOUNTER — Other Ambulatory Visit: Payer: Self-pay

## 2020-05-29 ENCOUNTER — Ambulatory Visit (INDEPENDENT_AMBULATORY_CARE_PROVIDER_SITE_OTHER): Payer: Medicare HMO | Admitting: Family Medicine

## 2020-05-29 VITALS — BP 102/82 | HR 79 | Temp 97.8°F | Resp 12 | Ht 61.0 in | Wt 83.1 lb

## 2020-05-29 DIAGNOSIS — F331 Major depressive disorder, recurrent, moderate: Secondary | ICD-10-CM

## 2020-05-29 DIAGNOSIS — E440 Moderate protein-calorie malnutrition: Secondary | ICD-10-CM | POA: Diagnosis not present

## 2020-05-29 NOTE — Progress Notes (Signed)
Name: Maureen [name]   MRN: [id number]    DOB: 06-16-1946   Date:05/29/2020       Progress Note  Subjective  Chief Complaint  Chief Complaint  Patient presents with  . Weight Loss    HPI  Malnutrition/MDD: son states they stopped having virtual visit with psychiatrist because it was not helpful. He contacted [facility] and they said she can be admitted for 20 days to a nursing home to see if she can gain weight under supervision and with dietician help. He would like for her to go to [facility] . Patient agrees. She is still depressed, but phq 9 seems to have improved a little.      Patient Active Problem List   Diagnosis Date Noted  . Paroxysmal atrial fibrillation (HCC) 03/22/2020  . Vitamin D deficiency 03/01/2020  . Esophageal dysphagia   . Arrhythmia 08/25/2019  . Rectal bleeding   . Low grade squamous intraepith lesion on cytologic smear cervix (lgsil) 08/04/2018  . Chronic venous insufficiency 07/05/2018  . Lymphedema 07/05/2018  . Hypertension 04/02/2018  . Swelling of limb 04/02/2018  . Depression, major, recurrent, mild (HCC) 12/29/2017  . Leukopenia 05/24/2017  . Allergic rhinitis, seasonal 06/29/2015  . Clavus 06/29/2015  . 1st degree AV block 06/29/2015  . Hammer toe 06/29/2015  . H/O: HTN (hypertension) 06/29/2015  . Blood glucose elevated 06/29/2015  . Floater, vitreous 06/29/2015  . Bilateral tinnitus 06/29/2015  . Hypothyroidism (acquired) 04/02/2015  . Gastroesophageal reflux disease 04/02/2015  . Hammer toe of right foot 04/02/2015  . Scoliosis of thoracic spine 04/02/2015  . Urethral prolapse 04/02/2015  . Corn of toe 04/02/2015  . Hyperlipidemia 04/02/2015  . Varicose veins of both lower extremities 04/02/2015    Past Surgical History:  Procedure Laterality Date  . ABDOMINAL HYSTERECTOMY  1989  . APPENDECTOMY  1989  . BREAST EXCISIONAL BIOPSY Bilateral    multiple biopsies negative  . BREAST SURGERY     Several  . COLONOSCOPY  WITH PROPOFOL N/A 07/28/2019   Procedure: COLONOSCOPY WITH PROPOFOL;  Surgeon: Toney Reil, MD;  Location: [facility] ENDOSCOPY;  Service: Gastroenterology;  Laterality: N/A;  . ESOPHAGOGASTRODUODENOSCOPY (EGD) WITH PROPOFOL N/A 11/11/2019   Procedure: ESOPHAGOGASTRODUODENOSCOPY (EGD) WITH PROPOFOL;  Surgeon: Toney Reil, MD;  Location: [facility] ENDOSCOPY;  Service: Gastroenterology;  Laterality: N/A;  . TUMOR EXCISION  1989   same time as hysterectomy    Family History  Problem Relation Age of Onset  . Diabetes Father 7       DM complications  . Hypertension Mother 95       CKD  . Thyroid disease Mother   . Heart failure Mother   . Kidney disease Mother   . Depression Mother   . Breast cancer Sister 25  . Depression Sister   . Kidney disease Other   . Breast cancer Maternal Aunt   . Breast cancer Cousin   . Anuerysm Son 51  . Diabetes Son     Social History   Tobacco Use  . Smoking status: Never Smoker  . Smokeless tobacco: Never Used  . Tobacco comment: smoking cessation materials not required  Substance Use Topics  . Alcohol use: Never    Alcohol/week: 0.0 standard drinks     Current Outpatient Medications:  .  clonazepam (KLONOPIN) 0.125 MG disintegrating tablet, Take 1 tablet (0.125 mg total) by mouth 2 (two) times daily., Disp: 60 tablet, Rfl: 0 .  levothyroxine (SYNTHROID) 88 MCG tablet, Take 1 tablet (  88 mcg total) by mouth daily before breakfast., Disp: 90 tablet, Rfl: 0 .  lidocaine (XYLOCAINE) 2 % solution, Use as directed 15 mLs in the mouth or throat every 6 (six) hours as needed for mouth pain., Disp: 100 mL, Rfl: 0 .  omeprazole (PRILOSEC) 40 MG capsule, Take 1 capsule (40 mg total) by mouth daily before breakfast., Disp: 90 capsule, Rfl: 3 .  Ascorbic Acid (VITAMIN C) 1000 MG tablet, Take 1,000 mg by mouth daily. (Patient not taking: Reported on 05/29/2020), Disp: , Rfl:  .  Garlic 1000 MG CAPS, Take 1 capsule by mouth daily.  (Patient not taking:  Reported on 05/29/2020), Disp: , Rfl:  .  Iron-Vitamins (GERITOL COMPLETE PO), Take 1 tablet by mouth daily. (Patient not taking: Reported on 05/29/2020), Disp: , Rfl:  .  mirtazapine (REMERON) 15 MG tablet, 7.5 mg at night for two weeks, then 15 mg at night (Patient not taking: Reported on 05/29/2020), Disp: 30 tablet, Rfl: 1 .  TURMERIC CURCUMIN PO, Take 550 mg by mouth daily. (Patient not taking: Reported on 05/29/2020), Disp: , Rfl:   Allergies  Allergen Reactions  . Aspirin     Tinnitus  . Citalopram Other (See Comments)  . Codeine Nausea And Vomiting  . Penicillins Rash    Has patient had a PCN reaction causing immediate rash, facial/tongue/throat swelling, SOB or lightheadedness with hypotension: Yes Has patient had a PCN reaction causing severe rash involving mucus membranes or skin necrosis: No Has patient had a PCN reaction that required hospitalization No Has patient had a PCN reaction occurring within the last 10 years: No If all of the above answers are "NO", then may proceed with Cephalosporin use.    I personally reviewed active problem list, medication list, allergies, family history, social history with the patient/caregiver today.   ROS  Ten systems reviewed and is negative except as mentioned in HPI  Balance is better, still has lack of appetite, dysphagia, feels tired and is depressed   Objective  Vitals:   05/29/20 1350  BP: 102/82  Pulse: 79  Resp: 12  Temp: 97.8 F (36.6 C)  TempSrc: Temporal  SpO2: 100%  Weight: 83 lb 1.6 oz (37.7 kg)  Height: 5\' 1"  (1.549 m)    Body mass index is 15.7 kg/m.  Physical Exam  Constitutional: Patient appears frail and cachetic .  No distress.  HEENT: head atraumatic, normocephalic, pupils equal and reactive to light, neck supple, throat within normal limits Cardiovascular: Normal rate, regular rhythm and normal heart sounds.  No murmur heard. No BLE edema. Pulmonary/Chest: Effort normal and breath sounds normal. No  respiratory distress. Abdominal: Soft.  There is no tenderness. Psychiatric: Patient has a normal mood and affect. behavior is normal. Judgment and thought content normal.Cooperative   Recent Results (from the past 2160 hour(s))  TSH     Status: Abnormal   Collection Time: 03/15/20 10:39 AM  Result Value Ref Range   TSH 12.52 (H) 0.40 - 4.50 mIU/L  POCT Urinalysis Dip Manual     Status: Abnormal   Collection Time: 04/27/20 11:04 AM  Result Value Ref Range   Spec Grav, UA <=1.005 (A) 1.010 - 1.025   pH, UA 6.0 5.0 - 8.0   Leukocytes, UA Moderate (2+) (A) Negative   Nitrite, UA Negative Negative   Poct Protein Negative Negative, trace mg/dL   Poct Glucose Normal Normal mg/dL   Poct Ketones Negative Negative   Poct Urobilinogen =1 (A) Normal mg/dL  Comment: 0.2   Poct Bilirubin Negative Negative   Poct Blood =50 (A) Negative, trace    Comment: moderate  TSH     Status: None   Collection Time: 04/27/20 11:39 AM  Result Value Ref Range   TSH 0.97 0.40 - 4.50 mIU/L  Urine Culture     Status: None   Collection Time: 04/27/20 11:45 AM  Result Value Ref Range   MICRO NUMBER: [id number]    SPECIMEN QUALITY: Adequate    Sample Source URINE    STATUS: FINAL    Result: No Growth       PHQ2/9: Depression screen [facility] 2/9 04/27/2020 03/22/2020 03/15/2020 01/20/2020 12/22/2019  Decreased Interest 2 3 2 2 3   Down, Depressed, Hopeless 2 3 1 1 1   PHQ - 2 Score 4 6 3 3 4   Altered sleeping 2 3 2  0 0  Tired, decreased energy 2 3 2 1 2   Change in appetite 3 3 2 3 3   Feeling bad or failure about yourself  0 3 1 3 3   Trouble concentrating 1 3 1  0 0  Moving slowly or fidgety/restless 1 3 2  0 3  Suicidal thoughts 0 0 0 1 0  PHQ-9 Score 13 24 13 11 15   Difficult doing work/chores Somewhat difficult Very difficult Somewhat difficult Very difficult Somewhat difficult  Some recent data might be hidden    phq 9 is positive   Fall Risk: Fall Risk  05/29/2020 04/27/2020 03/22/2020 03/15/2020 11/30/2019   Falls in the past year? 0 0 0 0 0  Number falls in past yr: 0 0 0 0 0  Injury with Fall? 0 0 0 0 0  Risk for fall due to : - - - - -  Risk for fall due to: Comment - - - - -  Follow up - - Falls evaluation completed - -     Functional Status Survey: Is the patient deaf or have difficulty hearing?: No Does the patient have difficulty seeing, even when wearing glasses/contacts?: No Does the patient have difficulty concentrating, remembering, or making decisions?: Yes Does the patient have difficulty walking or climbing stairs?: No Does the patient have difficulty dressing or bathing?: Yes Does the patient have difficulty doing errands alone such as visiting a doctor's office or shopping?: Yes    Assessment & Plan   1. Moderate malnutrition (HCC)  We will try admission to nursing home to see if she can gain weight.  Son will go to nursing home and bring a FL2 form for to fill out  2. Moderate episode of recurrent major depressive disorder (HCC)  Refuses to follow up with psychiatrist, seems to be son's choice more than the patient's choice

## 2020-06-04 ENCOUNTER — Telehealth: Payer: Medicare HMO

## 2020-06-04 ENCOUNTER — Telehealth: Payer: Self-pay

## 2020-06-04 NOTE — Telephone Encounter (Signed)
  Chronic Care Management   Outreach Note  06/04/2020 Name: Maureen [name] MRN: [id number] DOB: Sep 15, 1946  Primary Care Provider: Alba Cory, MD Reason for referral : Chronic Care Management    A second unsuccessful telephone outreach was attempted today. [name] was referred to the case management team for assistance with care management and care coordination.   A HIPAA compliant voice message was left today requesting a return call.   PLAN A member of the care management team will reach out to [name] again within the next two weeks.   [facility] Care Management [phone]

## 2020-06-07 ENCOUNTER — Other Ambulatory Visit: Payer: Self-pay

## 2020-06-07 ENCOUNTER — Encounter (HOSPITAL_COMMUNITY): Payer: Self-pay | Admitting: Psychiatry

## 2020-06-07 ENCOUNTER — Telehealth (INDEPENDENT_AMBULATORY_CARE_PROVIDER_SITE_OTHER): Payer: Medicare HMO | Admitting: Psychiatry

## 2020-06-07 DIAGNOSIS — F320 Major depressive disorder, single episode, mild: Secondary | ICD-10-CM

## 2020-06-07 DIAGNOSIS — F419 Anxiety disorder, unspecified: Secondary | ICD-10-CM | POA: Diagnosis not present

## 2020-06-07 NOTE — Progress Notes (Signed)
Virtual Visit via Video Note  I connected with Maureen FlorPatricia A [name] on 06/07/20 at  3:50 PM EDT by a video enabled telemedicine application and verified that I am speaking with the correct person using two identifiers.   I discussed the limitations of evaluation and management by telemedicine and the availability of in person appointments. The patient expressed understanding and agreed to proceed.    I discussed the assessment and treatment plan with the patient. The patient was provided an opportunity to ask questions and all were answered. The patient agreed with the plan and demonstrated an understanding of the instructions.   The patient was advised to call back or seek an in-person evaluation if the symptoms worsen or if the condition fails to improve as anticipated.  Location: patient- home, provider- home office   I provided 15 minutes of non-face-to-face time during this encounter.   Neysa Hottereina Rei Medlen, MD    [facility] MD/PA/NP OP Progress Note  06/07/2020 4:17 PM Maureen [name]  MRN:  [id number]  Chief Complaint:  Chief Complaint    Depression; Follow-up     HPI:  This is a follow-up appointment for depression.  Most of the history is provided by her son, who attends the interview.  She states that she has been doing "so so." When she is asked about recent loss of her sister's husband, she states that she is "okay" while she agrees that this is a significant loss for her. She agrees with her son, who states that she has been doing better. She "probably will" attend choir at church, which she used to enjoy; she has not done it since 2019. She states that her fear of choking has gotten less compared to before. She eats baby food and Ensure. She reports good appetite. She denies insomnia. She feels fatigue. She has good concentration. She denies SI. She feels less anxious. She denies panic attacks. She declined to take mirtazapine as she thinks she is on many medication.    Her son states  that she has been doing better for the past two weeks. Although she does have weight loss, her son states that her weight has been stable lately. Although she occasionally has a fear of choking, it has been better. She eats 3-4 Ensure, 3-4 baby food/stage 2 vegetables and some chicken.  He is hoping for her to go to a rehab facility, where she can see a nutritionist.  Although she used to beat up herself emotionally when something happened to her family, it has been getting better. She handles the loss by reading bible or watching TV. He believes she is not depressed, not anxious compared to before, and does not see the need to take medication/have follow up.    Used to be 110's a few months ago Wt Readings from Last 3 Encounters:  05/29/20 83 lb 1.6 oz (37.7 kg)  04/30/20 86 lb 12.8 oz (39.4 kg)  04/27/20 86 lb 12.8 oz (39.4 kg)    Visit Diagnosis:    ICD-10-CM   1. Current mild episode of major depressive disorder without prior episode (HCC)  F32.0   2. Anxiety disorder, unspecified type  F41.9     Past Psychiatric History: Please see initial evaluation for full details. I have reviewed the history. No updates at this time.     Past Medical History:  Past Medical History:  Diagnosis Date  . Allergy   . Corn of toe   . First degree AV block   . GERD (  gastroesophageal reflux disease)   . Hammer toe of right foot   . Hyperglycemia   . Hyperlipidemia   . Hypertension   . Loss of appetite   . Prolapse of urethra   . Scoliosis (and kyphoscoliosis), idiopathic   . Thyroid disease   . Tinnitus of both ears   . Vitreous floaters of right eye     Past Surgical History:  Procedure Laterality Date  . ABDOMINAL HYSTERECTOMY  1989  . APPENDECTOMY  1989  . BREAST EXCISIONAL BIOPSY Bilateral    multiple biopsies negative  . BREAST SURGERY     Several  . COLONOSCOPY WITH PROPOFOL N/A 07/28/2019   Procedure: COLONOSCOPY WITH PROPOFOL;  Surgeon: Toney Reil, MD;  Location: [facility]  ENDOSCOPY;  Service: Gastroenterology;  Laterality: N/A;  . ESOPHAGOGASTRODUODENOSCOPY (EGD) WITH PROPOFOL N/A 11/11/2019   Procedure: ESOPHAGOGASTRODUODENOSCOPY (EGD) WITH PROPOFOL;  Surgeon: Toney Reil, MD;  Location[facility] ENDOSCOPY;  Service: Gastroenterology;  Laterality: N/A;  . TUMOR EXCISION  1989   same time as hysterectomy    Family Psychiatric History: Please see initial evaluation for full details. I have reviewed the history. No updates at this time.     Family History:  Family History  Problem Relation Age of Onset  . Diabetes Father 55       DM complications  . Hypertension Mother 51       CKD  . Thyroid disease Mother   . Heart failure Mother   . Kidney disease Mother   . Depression Mother   . Breast cancer Sister 34  . Depression Sister   . Kidney disease Other   . Breast cancer Maternal Aunt   . Breast cancer Cousin   . Anuerysm Son 51  . Diabetes Son     Social History:  Social History   Socioeconomic History  . Marital status: Widowed    Spouse name: Not on file  . Number of children: 3  . Years of education: 16  . Highest education level: 12th grade  Occupational History  . Occupation: Retired  Tobacco Use  . Smoking status: Never Smoker  . Smokeless tobacco: Never Used  . Tobacco comment: smoking cessation materials not required  Vaping Use  . Vaping Use: Never used  Substance and Sexual Activity  . Alcohol use: Never    Alcohol/week: 0.0 standard drinks  . Drug use: No  . Sexual activity: Not Currently    Partners: Male  Other Topics Concern  . Not on file  Social History Narrative  . Not on file   Social Determinants of Health   Financial Resource Strain:   . Difficulty of Paying Living Expenses:   Food Insecurity:   . Worried About Programme researcher, broadcasting/film/video in the Last Year:   . Barista in the Last Year:   Transportation Needs:   . Freight forwarder (Medical):   Marland Kitchen Lack of Transportation (Non-Medical):    Physical Activity: Unknown  . Days of Exercise per Week: 3 days  . Minutes of Exercise per Session: Not on file  Stress:   . Feeling of Stress :   Social Connections:   . Frequency of Communication with Friends and Family:   . Frequency of Social Gatherings with Friends and Family:   . Attends Religious Services:   . Active Member of Clubs or Organizations:   . Attends Banker Meetings:   Marland Kitchen Marital Status:     Allergies:  Allergies  Allergen  Reactions  . Aspirin     Tinnitus  . Citalopram Other (See Comments)  . Codeine Nausea And Vomiting  . Penicillins Rash    Has patient had a PCN reaction causing immediate rash, facial/tongue/throat swelling, SOB or lightheadedness with hypotension: Yes Has patient had a PCN reaction causing severe rash involving mucus membranes or skin necrosis: No Has patient had a PCN reaction that required hospitalization No Has patient had a PCN reaction occurring within the last 10 years: No If all of the above answers are "NO", then may proceed with Cephalosporin use.    Metabolic Disorder Labs: Lab Results  Component Value Date   HGBA1C 5.9 (H) 06/17/2019   MPG 123 06/17/2019   MPG 126 09/30/2018   No results found for: PROLACTIN Lab Results  Component Value Date   CHOL 179 06/17/2019   TRIG 64 06/17/2019   HDL 45 (L) 06/17/2019   CHOLHDL 4.0 06/17/2019   VLDL 17 01/20/2017   LDLCALC 118 (H) 06/17/2019   LDLCALC 136 (H) 01/08/2018   Lab Results  Component Value Date   TSH 0.97 04/27/2020   TSH 12.52 (H) 03/15/2020    Therapeutic Level Labs: No results found for: LITHIUM No results found for: VALPROATE No components found for:  CBMZ  Current Medications: Current Outpatient Medications  Medication Sig Dispense Refill  . Ascorbic Acid (VITAMIN C) 1000 MG tablet Take 1,000 mg by mouth daily. (Patient not taking: Reported on 05/29/2020)    . clonazepam (KLONOPIN) 0.125 MG disintegrating tablet Take 1 tablet (0.125 mg  total) by mouth 2 (two) times daily. 60 tablet 0  . Garlic 1000 MG CAPS Take 1 capsule by mouth daily.  (Patient not taking: Reported on 05/29/2020)    . Iron-Vitamins (GERITOL COMPLETE PO) Take 1 tablet by mouth daily. (Patient not taking: Reported on 05/29/2020)    . levothyroxine (SYNTHROID) 88 MCG tablet Take 1 tablet (88 mcg total) by mouth daily before breakfast. 90 tablet 0  . lidocaine (XYLOCAINE) 2 % solution Use as directed 15 mLs in the mouth or throat every 6 (six) hours as needed for mouth pain. 100 mL 0  . mirtazapine (REMERON) 15 MG tablet 7.5 mg at night for two weeks, then 15 mg at night (Patient not taking: Reported on 05/29/2020) 30 tablet 1  . omeprazole (PRILOSEC) 40 MG capsule Take 1 capsule (40 mg total) by mouth daily before breakfast. 90 capsule 3  . TURMERIC CURCUMIN PO Take 550 mg by mouth daily. (Patient not taking: Reported on 05/29/2020)     No current facility-administered medications for this visit.     Musculoskeletal: Strength & Muscle Tone: N/A Gait & Station: N/A Patient leans: N/A  Psychiatric Specialty Exam: Review of Systems  Psychiatric/Behavioral: Positive for dysphoric mood. Negative for agitation, behavioral problems, confusion, decreased concentration, hallucinations, self-injury, sleep disturbance and suicidal ideas. The patient is nervous/anxious. The patient is not hyperactive.   All other systems reviewed and are negative.   There were no vitals taken for this visit.There is no height or weight on file to calculate BMI.  General Appearance: Fairly Groomed  Eye Contact:  Fair  Speech:  Clear and Coherent  Volume:  Normal  Mood:  Depressed  Affect:  Appropriate, Congruent and Restricted  Thought Process:  Coherent  Orientation:  Full (Time, Place, and Person)  Thought Content: Logical   Suicidal Thoughts:  No  Homicidal Thoughts:  No  Memory:  Immediate;   Good  Judgement:  Good  Insight:  Fair  Psychomotor Activity:  Normal  Concentration:   Concentration: Good and Attention Span: Good  Recall:  Good  Fund of Knowledge: Good  Language: Good  Akathisia:  No  Handed:  Right  AIMS (if indicated): not done  Assets:  Social Support  ADL's:  Intact  Cognition: WNL  Sleep:  Good   Screenings: GAD-7     [facility] Visit from 04/27/2020 in [facility] Office Visit from 12/22/2019 in [facility] Office Visit from 11/30/2019 in [facility] Office Visit from 01/11/2019 in [facility] Office Visit from 09/30/2018 in [facility]  Total GAD-7 Score 5 12 7 3 2     PHQ2-9     Office Visit from 04/27/2020 in [facility] Office Visit from 03/22/2020 in [facility] Office Visit from 03/15/2020 in [facility] Office Visit from 01/20/2020 in [facility] Office Visit from 12/22/2019 in [facility]  PHQ-2 Total Score 4 6 3 3 4   PHQ-9 Total Score 13 24 13 11 15        Assessment and Plan:  Maureen [name] is a 74 y.o. year old female with a history of  depression,malnutrition, hypothyroidism,, who presents for follow up appointment for below.    1. Current mild episode of major depressive disorder without prior episode (HCC) 2. Anxiety disorder, unspecified type Although she continues to have significant weight loss, and has mild depressive symptoms, both the patient and her son states that there has been improvement in depression, anxiety, and fear of choking since the last visit.  The patient politely declines to start mirtazapine due to the concern of polypharmacy, while it is strongly recommended by this provider. It is reasonable to continue clonazepam at this time given benefit outweighs risk of falls, drowsiness.  The patient and her son do not see the need for follow-up appointment with this provider.  Both of them agreed to contact the office as needed/if any  worsening in her symptoms.  the patient agrees to contact the office  # r/o mild neurocognitive disorder She is alert and oriented during the interview.  Her son reports a few episodes of confusion in the past.  It is unclear whether her current symptoms are prodromal symptoms of neurocognitive disorder.  Will need longitudinal assessment.   Plan I have reviewed and updated plans as below 1. Recommend starting mirtazapine 7.5 mg at night for one week, then 15 mg at night- patient declined 2. Continue clonazepam 0.125 mg twice a day as needed for anxiety 3. Next appointment: as needed   The patient demonstrates the following risk factors for suicide: Chronic risk factors for suicide include:N/A. Acute risk factorsfor suicide include: unemployment. Protective factorsfor this patient include: positive social support. Considering these factors, the overall suicide risk at this point appears to below. Patientisappropriate for outpatient follow up.   , MD 06/07/2020, 4:17 PM

## 2020-07-07 NOTE — Progress Notes (Deleted)
[place]  [phone]) [phone] Fax:(336) [phone]  ID: Maureen [name] OB: [birth year]  MR#: [id number]  RAQ#:[id number]  Patient Care Team: Steele Sizer, MD as PCP - General (Family Medicine) Anell Barr, [place] as Consulting Physician (Optometry) Dew, Erskine Squibb, MD as Consulting Physician (Vascular Surgery) Lloyd Huger, MD as Consulting Physician (Oncology) Neldon Labella, RN as Case Manager Gae Dry, MD as Referring Physician (Obstetrics and Gynecology) Norman Clay, MD as Consulting Physician (Psychiatry)  I connected with Maureen [name] on 07/07/20 at  2:15 PM EDT by {Blank single:19197::"video enabled telemedicine visit","telephone visit"} and verified that I am speaking with the correct person using two identifiers.   I discussed the limitations, risks, security and privacy concerns of performing an evaluation and management service by telemedicine and the availability of in-person appointments. I also discussed with the patient that there may be a patient responsible charge related to this service. The patient expressed understanding and agreed to proceed.   Other persons participating in the visit and their role in the encounter: Patient, MD.  Patient's location: Home. Provider's location: Clinic.  CHIEF COMPLAINT: Neutropenia.  INTERVAL HISTORY: Patient agreed to video enabled telemedicine visit for further evaluation and discussion of her laboratory results.  She has improved caloric intake over the past several weeks and her son does not report any additional weight loss.  She has no neurologic complaints.  She denies any recent fevers or illnesses.  She has no chest pain, shortness of breath, cough, or hemoptysis.  She denies any nausea, vomiting, constipation, or diarrhea.  She has no urinary complaints.  Patient offers no specific complaints today.  REVIEW OF SYSTEMS:   Review of Systems  Constitutional: Negative.  Negative for  fever, malaise/fatigue and weight loss.  Respiratory: Negative.  Negative for cough, hemoptysis and shortness of breath.   Cardiovascular: Negative.  Negative for chest pain and leg swelling.  Gastrointestinal: Negative.  Negative for abdominal pain, nausea and vomiting.  Genitourinary: Negative.  Negative for dysuria.  Musculoskeletal: Negative.  Negative for back pain.  Skin: Negative.  Negative for rash.  Neurological: Negative.  Negative for dizziness, focal weakness, weakness and headaches.  Psychiatric/Behavioral: Negative.  The patient is not nervous/anxious.     As per HPI. Otherwise, a complete review of systems is negative.  PAST MEDICAL HISTORY: Past Medical History:  Diagnosis Date  . Allergy   . Corn of toe   . First degree AV block   . GERD (gastroesophageal reflux disease)   . Hammer toe of right foot   . Hyperglycemia   . Hyperlipidemia   . Hypertension   . Loss of appetite   . Prolapse of urethra   . Scoliosis (and kyphoscoliosis), idiopathic   . Thyroid disease   . Tinnitus of both ears   . Vitreous floaters of right eye     PAST SURGICAL HISTORY: Past Surgical History:  Procedure Laterality Date  . ABDOMINAL HYSTERECTOMY  1989  . APPENDECTOMY  1989  . BREAST EXCISIONAL BIOPSY Bilateral    multiple biopsies negative  . BREAST SURGERY     Several  . COLONOSCOPY WITH PROPOFOL N/A 07/28/2019   Procedure: COLONOSCOPY WITH PROPOFOL;  Surgeon: Lin Landsman, MD;  Location: [facility] ENDOSCOPY;  Service: Gastroenterology;  Laterality: N/A;  . ESOPHAGOGASTRODUODENOSCOPY (EGD) WITH PROPOFOL N/A 11/11/2019   Procedure: ESOPHAGOGASTRODUODENOSCOPY (EGD) WITH PROPOFOL;  Surgeon: Lin Landsman, MD;  Location: [facility] ENDOSCOPY;  Service: Gastroenterology;  Laterality: N/A;  . TUMOR EXCISION  1989   same time as hysterectomy    FAMILY HISTORY: Family History  Problem Relation Age of Onset  . Diabetes Father 9       DM complications  . Hypertension Mother 26        CKD  . Thyroid disease Mother   . Heart failure Mother   . Kidney disease Mother   . Depression Mother   . Breast cancer Sister 40  . Depression Sister   . Kidney disease Other   . Breast cancer Maternal Aunt   . Breast cancer Cousin   . Anuerysm Son 73  . Diabetes Son     ADVANCED DIRECTIVES (Y/N):  N  HEALTH MAINTENANCE: Social History   Tobacco Use  . Smoking status: Never Smoker  . Smokeless tobacco: Never Used  . Tobacco comment: smoking cessation materials not required  Vaping Use  . Vaping Use: Never used  Substance Use Topics  . Alcohol use: Never    Alcohol/week: 0.0 standard drinks  . Drug use: No     Colonoscopy:  PAP:  Bone density:  Lipid panel:  Allergies  Allergen Reactions  . Aspirin     Tinnitus  . Citalopram Other (See Comments)  . Codeine Nausea And Vomiting  . Penicillins Rash    Has patient had a PCN reaction causing immediate rash, facial/tongue/throat swelling, SOB or lightheadedness with hypotension: Yes Has patient had a PCN reaction causing severe rash involving mucus membranes or skin necrosis: No Has patient had a PCN reaction that required hospitalization No Has patient had a PCN reaction occurring within the last 10 years: No If all of the above answers are "NO", then may proceed with Cephalosporin use.    Current Outpatient Medications  Medication Sig Dispense Refill  . Ascorbic Acid (VITAMIN C) 1000 MG tablet Take 1,000 mg by mouth daily. (Patient not taking: Reported on 05/29/2020)    . clonazepam (KLONOPIN) 0.125 MG disintegrating tablet Take 1 tablet (0.125 mg total) by mouth 2 (two) times daily. 60 tablet 0  . Garlic 4193 MG CAPS Take 1 capsule by mouth daily.  (Patient not taking: Reported on 05/29/2020)    . Iron-Vitamins (GERITOL COMPLETE PO) Take 1 tablet by mouth daily. (Patient not taking: Reported on 05/29/2020)    . levothyroxine (SYNTHROID) 88 MCG tablet Take 1 tablet (88 mcg total) by mouth daily before breakfast.  90 tablet 0  . lidocaine (XYLOCAINE) 2 % solution Use as directed 15 mLs in the mouth or throat every 6 (six) hours as needed for mouth pain. 100 mL 0  . mirtazapine (REMERON) 15 MG tablet 7.5 mg at night for two weeks, then 15 mg at night (Patient not taking: Reported on 05/29/2020) 30 tablet 1  . omeprazole (PRILOSEC) 40 MG capsule Take 1 capsule (40 mg total) by mouth daily before breakfast. 90 capsule 3  . TURMERIC CURCUMIN PO Take 550 mg by mouth daily. (Patient not taking: Reported on 05/29/2020)     No current facility-administered medications for this visit.    OBJECTIVE: There were no vitals filed for this visit.   There is no height or weight on file to calculate BMI.    ECOG FS:1 - Symptomatic but completely ambulatory  General: Thin, no acute distress. HEENT: Normocephalic. Neuro: Alert, answering all questions appropriately. Cranial nerves grossly intact. Psych: Normal affect.   LAB RESULTS:  Lab Results  Component Value Date   NA 131 (L) 01/25/2020   K 4.4 01/25/2020   CL 89 (  L) 01/25/2020   CO2 34 (H) 01/25/2020   GLUCOSE 112 (H) 01/25/2020   BUN 4 (L) 01/25/2020   CREATININE 0.66 01/25/2020   CALCIUM 9.7 01/25/2020   PROT 7.0 01/25/2020   ALBUMIN 4.0 11/19/2019   AST 24 01/25/2020   ALT 11 01/25/2020   ALKPHOS 46 11/19/2019   BILITOT 0.5 01/25/2020   GFRNONAA 87 01/25/2020   GFRAA 101 01/25/2020    Lab Results  Component Value Date   WBC 2.5 (L) 02/13/2020   NEUTROABS 0.8 (L) 02/13/2020   HGB 12.4 02/13/2020   HCT 35.5 (L) 02/13/2020   MCV 88.3 02/13/2020   PLT 179 02/13/2020     STUDIES: No results found.  ASSESSMENT: Neutropenia  PLAN:    1. Neutropenia: Patient's total white blood cell count and ANC remain decreased, but essentially stable at 2.5.  Suspect this may be been nutritional given her poor p.o. intake.  All of her other laboratory work including folate, B12, iron stores, LDH, peripheral blood flow cytometry, and neutrophil antibodies  are either negative or within normal limits. [employer] Myeloid panel is pending at time of dictation.  No intervention is needed at this time.  Patient does not require bone marrow biopsy.  Return to clinic in 4 months with repeat laboratory work and video assisted telemedicine visit.  If patient's white count remains stable and she continues to be asymptomatic, she likely can be discharged from clinic. 2.  Weight loss: Improved with improved caloric intake.  Patient had CT of the chest and neck in January 2021 that did not reveal any distinct pathology.  Patient has also had endoscopic evaluation by GI that was reported as unrevealing.  She has instructed to continue her follow-up with GI as scheduled.    I provided *** minutes of {Blank single:19197::"face-to-face video visit time","non face-to-face telephone visit time"} during this encounter which included chart review, counseling, and coordination of care as documented above.    Patient expressed understanding and was in agreement with this plan. She also understands that She can call clinic at any time with any questions, concerns, or complaints.    Lloyd Huger, MD   07/07/2020 10:53 AM

## 2020-07-09 ENCOUNTER — Inpatient Hospital Stay: Payer: Medicare HMO

## 2020-07-10 ENCOUNTER — Inpatient Hospital Stay: Payer: Medicare HMO | Admitting: Oncology

## 2020-07-10 ENCOUNTER — Other Ambulatory Visit: Payer: Self-pay

## 2020-07-12 ENCOUNTER — Inpatient Hospital Stay: Payer: Medicare HMO | Attending: Oncology

## 2020-07-12 DIAGNOSIS — Z803 Family history of malignant neoplasm of breast: Secondary | ICD-10-CM | POA: Insufficient documentation

## 2020-07-12 DIAGNOSIS — Z8349 Family history of other endocrine, nutritional and metabolic diseases: Secondary | ICD-10-CM | POA: Insufficient documentation

## 2020-07-12 DIAGNOSIS — Z833 Family history of diabetes mellitus: Secondary | ICD-10-CM | POA: Insufficient documentation

## 2020-07-12 DIAGNOSIS — Z8249 Family history of ischemic heart disease and other diseases of the circulatory system: Secondary | ICD-10-CM | POA: Insufficient documentation

## 2020-07-12 DIAGNOSIS — I10 Essential (primary) hypertension: Secondary | ICD-10-CM | POA: Insufficient documentation

## 2020-07-12 DIAGNOSIS — K219 Gastro-esophageal reflux disease without esophagitis: Secondary | ICD-10-CM | POA: Insufficient documentation

## 2020-07-12 DIAGNOSIS — Z79899 Other long term (current) drug therapy: Secondary | ICD-10-CM | POA: Insufficient documentation

## 2020-07-12 DIAGNOSIS — D709 Neutropenia, unspecified: Secondary | ICD-10-CM | POA: Insufficient documentation

## 2020-07-12 DIAGNOSIS — E785 Hyperlipidemia, unspecified: Secondary | ICD-10-CM | POA: Insufficient documentation

## 2020-07-12 DIAGNOSIS — Z90710 Acquired absence of both cervix and uterus: Secondary | ICD-10-CM | POA: Insufficient documentation

## 2020-07-13 ENCOUNTER — Telehealth: Payer: Self-pay | Admitting: Oncology

## 2020-07-13 NOTE — Progress Notes (Deleted)
[place]  Telephone:(336) [phone] Fax:(336) [phone]  ID: Maureen [name] OB: 1946/07/19  MR#: [id number]  BMW#:[id number]  Patient Care Team: Steele Sizer, MD as PCP - General (Family Medicine) Anell Barr, Leon as Consulting Physician (Optometry) Dew, Erskine Squibb, MD as Consulting Physician (Vascular Surgery) Lloyd Huger, MD as Consulting Physician (Oncology) Neldon Labella, RN as Case Manager Gae Dry, MD as Referring Physician (Obstetrics and Gynecology) Norman Clay, MD as Consulting Physician (Psychiatry)  I connected with Maureen [name] on 07/13/20 at  2:15 PM EDT by {Blank single:19197::"video enabled telemedicine visit","telephone visit"} and verified that I am speaking with the correct person using two identifiers.   I discussed the limitations, risks, security and privacy concerns of performing an evaluation and management service by telemedicine and the availability of in-person appointments. I also discussed with the patient that there may be a patient responsible charge related to this service. The patient expressed understanding and agreed to proceed.   Other persons participating in the visit and their role in the encounter: Patient, MD.  Patient's location: Home. Provider's location: Clinic.  CHIEF COMPLAINT: Neutropenia.  INTERVAL HISTORY: Patient agreed to video enabled telemedicine visit for further evaluation and discussion of her laboratory results.  She has improved caloric intake over the past several weeks and her son does not report any additional weight loss.  She has no neurologic complaints.  She denies any recent fevers or illnesses.  She has no chest pain, shortness of breath, cough, or hemoptysis.  She denies any nausea, vomiting, constipation, or diarrhea.  She has no urinary complaints.  Patient offers no specific complaints today.  REVIEW OF SYSTEMS:   Review of Systems  Constitutional: Negative.  Negative for  fever, malaise/fatigue and weight loss.  Respiratory: Negative.  Negative for cough, hemoptysis and shortness of breath.   Cardiovascular: Negative.  Negative for chest pain and leg swelling.  Gastrointestinal: Negative.  Negative for abdominal pain, nausea and vomiting.  Genitourinary: Negative.  Negative for dysuria.  Musculoskeletal: Negative.  Negative for back pain.  Skin: Negative.  Negative for rash.  Neurological: Negative.  Negative for dizziness, focal weakness, weakness and headaches.  Psychiatric/Behavioral: Negative.  The patient is not nervous/anxious.     As per HPI. Otherwise, a complete review of systems is negative.  PAST MEDICAL HISTORY: Past Medical History:  Diagnosis Date  . Allergy   . Corn of toe   . First degree AV block   . GERD (gastroesophageal reflux disease)   . Hammer toe of right foot   . Hyperglycemia   . Hyperlipidemia   . Hypertension   . Loss of appetite   . Prolapse of urethra   . Scoliosis (and kyphoscoliosis), idiopathic   . Thyroid disease   . Tinnitus of both ears   . Vitreous floaters of right eye     PAST SURGICAL HISTORY: Past Surgical History:  Procedure Laterality Date  . ABDOMINAL HYSTERECTOMY  1989  . APPENDECTOMY  1989  . BREAST EXCISIONAL BIOPSY Bilateral    multiple biopsies negative  . BREAST SURGERY     Several  . COLONOSCOPY WITH PROPOFOL N/A 07/28/2019   Procedure: COLONOSCOPY WITH PROPOFOL;  Surgeon: Lin Landsman, MD;  Location: [facility] ENDOSCOPY;  Service: Gastroenterology;  Laterality: N/A;  . ESOPHAGOGASTRODUODENOSCOPY (EGD) WITH PROPOFOL N/A 11/11/2019   Procedure: ESOPHAGOGASTRODUODENOSCOPY (EGD) WITH PROPOFOL;  Surgeon: Lin Landsman, MD;  Location: [facility] ENDOSCOPY;  Service: Gastroenterology;  Laterality: N/A;  . TUMOR EXCISION  1989   same time as hysterectomy    FAMILY HISTORY: Family History  Problem Relation Age of Onset  . Diabetes Father 32       DM complications  . Hypertension Mother 43        CKD  . Thyroid disease Mother   . Heart failure Mother   . Kidney disease Mother   . Depression Mother   . Breast cancer Sister 45  . Depression Sister   . Kidney disease Other   . Breast cancer Maternal Aunt   . Breast cancer Cousin   . Anuerysm Son 43  . Diabetes Son     ADVANCED DIRECTIVES (Y/N):  N  HEALTH MAINTENANCE: Social History   Tobacco Use  . Smoking status: Never Smoker  . Smokeless tobacco: Never Used  . Tobacco comment: smoking cessation materials not required  Vaping Use  . Vaping Use: Never used  Substance Use Topics  . Alcohol use: Never    Alcohol/week: 0.0 standard drinks  . Drug use: No     Colonoscopy:  PAP:  Bone density:  Lipid panel:  Allergies  Allergen Reactions  . Aspirin     Tinnitus  . Citalopram Other (See Comments)  . Codeine Nausea And Vomiting  . Penicillins Rash    Has patient had a PCN reaction causing immediate rash, facial/tongue/throat swelling, SOB or lightheadedness with hypotension: Yes Has patient had a PCN reaction causing severe rash involving mucus membranes or skin necrosis: No Has patient had a PCN reaction that required hospitalization No Has patient had a PCN reaction occurring within the last 10 years: No If all of the above answers are "NO", then may proceed with Cephalosporin use.    Current Outpatient Medications  Medication Sig Dispense Refill  . Ascorbic Acid (VITAMIN C) 1000 MG tablet Take 1,000 mg by mouth daily. (Patient not taking: Reported on 05/29/2020)    . clonazepam (KLONOPIN) 0.125 MG disintegrating tablet Take 1 tablet (0.125 mg total) by mouth 2 (two) times daily. 60 tablet 0  . Garlic 2637 MG CAPS Take 1 capsule by mouth daily.  (Patient not taking: Reported on 05/29/2020)    . Iron-Vitamins (GERITOL COMPLETE PO) Take 1 tablet by mouth daily. (Patient not taking: Reported on 05/29/2020)    . levothyroxine (SYNTHROID) 88 MCG tablet Take 1 tablet (88 mcg total) by mouth daily before breakfast.  90 tablet 0  . lidocaine (XYLOCAINE) 2 % solution Use as directed 15 mLs in the mouth or throat every 6 (six) hours as needed for mouth pain. 100 mL 0  . mirtazapine (REMERON) 15 MG tablet 7.5 mg at night for two weeks, then 15 mg at night (Patient not taking: Reported on 05/29/2020) 30 tablet 1  . omeprazole (PRILOSEC) 40 MG capsule Take 1 capsule (40 mg total) by mouth daily before breakfast. 90 capsule 3  . TURMERIC CURCUMIN PO Take 550 mg by mouth daily. (Patient not taking: Reported on 05/29/2020)     No current facility-administered medications for this visit.    OBJECTIVE: There were no vitals filed for this visit.   There is no height or weight on file to calculate BMI.    ECOG FS:1 - Symptomatic but completely ambulatory  General: Thin, no acute distress. HEENT: Normocephalic. Neuro: Alert, answering all questions appropriately. Cranial nerves grossly intact. Psych: Normal affect.   LAB RESULTS:  Lab Results  Component Value Date   NA 131 (L) 01/25/2020   K 4.4 01/25/2020   CL 89 (  L) 01/25/2020   CO2 34 (H) 01/25/2020   GLUCOSE 112 (H) 01/25/2020   BUN 4 (L) 01/25/2020   CREATININE 0.66 01/25/2020   CALCIUM 9.7 01/25/2020   PROT 7.0 01/25/2020   ALBUMIN 4.0 11/19/2019   AST 24 01/25/2020   ALT 11 01/25/2020   ALKPHOS 46 11/19/2019   BILITOT 0.5 01/25/2020   GFRNONAA 87 01/25/2020   GFRAA 101 01/25/2020    Lab Results  Component Value Date   WBC 2.5 (L) 02/13/2020   NEUTROABS 0.8 (L) 02/13/2020   HGB 12.4 02/13/2020   HCT 35.5 (L) 02/13/2020   MCV 88.3 02/13/2020   PLT 179 02/13/2020     STUDIES: No results found.  ASSESSMENT: Neutropenia  PLAN:    1. Neutropenia: Patient's total white blood cell count and ANC remain decreased, but essentially stable at 2.5.  Suspect this may be been nutritional given her poor p.o. intake.  All of her other laboratory work including folate, B12, iron stores, LDH, peripheral blood flow cytometry, and neutrophil antibodies  are either negative or within normal limits. [employer] Myeloid panel is pending at time of dictation.  No intervention is needed at this time.  Patient does not require bone marrow biopsy.  Return to clinic in 4 months with repeat laboratory work and video assisted telemedicine visit.  If patient's white count remains stable and she continues to be asymptomatic, she likely can be discharged from clinic. 2.  Weight loss: Improved with improved caloric intake.  Patient had CT of the chest and neck in January 2021 that did not reveal any distinct pathology.  Patient has also had endoscopic evaluation by GI that was reported as unrevealing.  She has instructed to continue her follow-up with GI as scheduled.    I provided *** minutes of {Blank single:19197::"face-to-face video visit time","non face-to-face telephone visit time"} during this encounter which included chart review, counseling, and coordination of care as documented above.    Patient expressed understanding and was in agreement with this plan. She also understands that She can call clinic at any time with any questions, concerns, or complaints.    Lloyd Huger, MD   07/13/2020 7:07 AM

## 2020-07-13 NOTE — Telephone Encounter (Signed)
[name], Rod, phoned and rescheduled patient's lab and mychart appts. Lab is now 07-16-20 and mychart with MD is on 07-17-20.

## 2020-07-15 NOTE — Progress Notes (Signed)
[place]  Telephone:(336) [phone] Fax:(336) [phone]  ID: Maureen [name] OB: [birth year]  MR#: [id number]  AOZ#:[id number]  Patient Care Team: Steele Sizer, MD as PCP - General (Family Medicine) Anell Barr, Tolu as Consulting Physician (Optometry) Dew, Erskine Squibb, MD as Consulting Physician (Vascular Surgery) Lloyd Huger, MD as Consulting Physician (Oncology) Neldon Labella, RN as Case Manager Gae Dry, MD as Referring Physician (Obstetrics and Gynecology) Norman Clay, MD as Consulting Physician (Psychiatry)  I connected with [name] on 07/18/20 at  3:30 PM EDT by video enabled telemedicine visit and verified that I am speaking with the correct person using two identifiers.   I discussed the limitations, risks, security and privacy concerns of performing an evaluation and management service by telemedicine and the availability of in-person appointments. I also discussed with the patient that there may be a patient responsible charge related to this service. The patient expressed understanding and agreed to proceed.   Other persons participating in the visit and their role in the encounter: Patient, patient's son, MD.  Patient's location: Home. Provider's location: Clinic.  CHIEF COMPLAINT: Neutropenia.  INTERVAL HISTORY: Patient and her son agreed to video enabled telemedicine visit for further evaluation and discussion of her laboratory results.  Due to technical difficulties, visit was converted to telephone only.  Patient continues to have poor caloric intake and weight loss.  She has no neurologic complaints.  She denies any recent fevers or illnesses.  She has no chest pain, shortness of breath, cough, or hemoptysis.  She denies any nausea, vomiting, constipation, or diarrhea.  She has no urinary complaints.  Patient offers no further specific complaints today.  REVIEW OF SYSTEMS:   Review of Systems  Constitutional: Negative.   Negative for fever, malaise/fatigue and weight loss.  Respiratory: Negative.  Negative for cough, hemoptysis and shortness of breath.   Cardiovascular: Negative.  Negative for chest pain and leg swelling.  Gastrointestinal: Negative.  Negative for abdominal pain, nausea and vomiting.  Genitourinary: Negative.  Negative for dysuria.  Musculoskeletal: Negative.  Negative for back pain.  Skin: Negative.  Negative for rash.  Neurological: Negative.  Negative for dizziness, focal weakness, weakness and headaches.  Psychiatric/Behavioral: Negative.  The patient is not nervous/anxious.     As per HPI. Otherwise, a complete review of systems is negative.  PAST MEDICAL HISTORY: Past Medical History:  Diagnosis Date  . Allergy   . Corn of toe   . First degree AV block   . GERD (gastroesophageal reflux disease)   . Hammer toe of right foot   . Hyperglycemia   . Hyperlipidemia   . Hypertension   . Loss of appetite   . Prolapse of urethra   . Scoliosis (and kyphoscoliosis), idiopathic   . Thyroid disease   . Tinnitus of both ears   . Vitreous floaters of right eye     PAST SURGICAL HISTORY: Past Surgical History:  Procedure Laterality Date  . ABDOMINAL HYSTERECTOMY  1989  . APPENDECTOMY  1989  . BREAST EXCISIONAL BIOPSY Bilateral    multiple biopsies negative  . BREAST SURGERY     Several  . COLONOSCOPY WITH PROPOFOL N/A 07/28/2019   Procedure: COLONOSCOPY WITH PROPOFOL;  Surgeon: Lin Landsman, MD;  Location: [facility] ENDOSCOPY;  Service: Gastroenterology;  Laterality: N/A;  . ESOPHAGOGASTRODUODENOSCOPY (EGD) WITH PROPOFOL N/A 11/11/2019   Procedure: ESOPHAGOGASTRODUODENOSCOPY (EGD) WITH PROPOFOL;  Surgeon: Lin Landsman, MD;  Location: [facility] ENDOSCOPY;  Service: Gastroenterology;  Laterality: N/A;  .  TUMOR EXCISION  1989   same time as hysterectomy    FAMILY HISTORY: Family History  Problem Relation Age of Onset  . Diabetes Father 45       DM complications  .  Hypertension Mother 68       CKD  . Thyroid disease Mother   . Heart failure Mother   . Kidney disease Mother   . Depression Mother   . Breast cancer Sister 53  . Depression Sister   . Kidney disease Other   . Breast cancer Maternal Aunt   . Breast cancer Cousin   . Anuerysm Son 26  . Diabetes Son     ADVANCED DIRECTIVES (Y/N):  N  HEALTH MAINTENANCE: Social History   Tobacco Use  . Smoking status: Never Smoker  . Smokeless tobacco: Never Used  . Tobacco comment: smoking cessation materials not required  Vaping Use  . Vaping Use: Never used  Substance Use Topics  . Alcohol use: Never    Alcohol/week: 0.0 standard drinks  . Drug use: No     Colonoscopy:  PAP:  Bone density:  Lipid panel:  Allergies  Allergen Reactions  . Aspirin     Tinnitus  . Citalopram Other (See Comments)  . Codeine Nausea And Vomiting  . Penicillins Rash    Has patient had a PCN reaction causing immediate rash, facial/tongue/throat swelling, SOB or lightheadedness with hypotension: Yes Has patient had a PCN reaction causing severe rash involving mucus membranes or skin necrosis: No Has patient had a PCN reaction that required hospitalization No Has patient had a PCN reaction occurring within the last 10 years: No If all of the above answers are "NO", then may proceed with Cephalosporin use.    Current Outpatient Medications  Medication Sig Dispense Refill  . Ascorbic Acid (VITAMIN C) 1000 MG tablet Take 1,000 mg by mouth daily.     . clonazepam (KLONOPIN) 0.125 MG disintegrating tablet Take 1 tablet (0.125 mg total) by mouth 2 (two) times daily. (Patient taking differently: Take 0.125 mg by mouth daily. ) 60 tablet 0  . levothyroxine (SYNTHROID) 88 MCG tablet Take 1 tablet (88 mcg total) by mouth daily before breakfast. 90 tablet 0  . lidocaine (XYLOCAINE) 2 % solution Use as directed 15 mLs in the mouth or throat every 6 (six) hours as needed for mouth pain. 100 mL 0  . omeprazole  (PRILOSEC) 40 MG capsule Take 1 capsule (40 mg total) by mouth daily before breakfast. 90 capsule 3  . Garlic 0102 MG CAPS Take 1 capsule by mouth daily.  (Patient not taking: Reported on 05/29/2020)    . Iron-Vitamins (GERITOL COMPLETE PO) Take 1 tablet by mouth daily. (Patient not taking: Reported on 05/29/2020)    . mirtazapine (REMERON) 15 MG tablet 7.5 mg at night for two weeks, then 15 mg at night (Patient not taking: Reported on 05/29/2020) 30 tablet 1  . TURMERIC CURCUMIN PO Take 550 mg by mouth daily. (Patient not taking: Reported on 05/29/2020)     No current facility-administered medications for this visit.    OBJECTIVE: There were no vitals filed for this visit.   There is no height or weight on file to calculate BMI.    ECOG FS:1 - Symptomatic but completely ambulatory  General: Well-developed, well-nourished, no acute distress. HEENT: Normocephalic. Neuro: Alert, answering all questions appropriately. Cranial nerves grossly intact. Psych: Normal affect.   LAB RESULTS:  Lab Results  Component Value Date   NA 131 (L) 01/25/2020  K 4.4 01/25/2020   CL 89 (L) 01/25/2020   CO2 34 (H) 01/25/2020   GLUCOSE 112 (H) 01/25/2020   BUN 4 (L) 01/25/2020   CREATININE 0.66 01/25/2020   CALCIUM 9.7 01/25/2020   PROT 7.0 01/25/2020   ALBUMIN 4.0 11/19/2019   AST 24 01/25/2020   ALT 11 01/25/2020   ALKPHOS 46 11/19/2019   BILITOT 0.5 01/25/2020   GFRNONAA 87 01/25/2020   GFRAA 101 01/25/2020    Lab Results  Component Value Date   WBC 2.1 (L) 07/16/2020   NEUTROABS 0.8 (L) 07/16/2020   HGB 10.3 (L) 07/16/2020   HCT 29.0 (L) 07/16/2020   MCV 85.8 07/16/2020   PLT 142 (L) 07/16/2020     STUDIES: No results found.  ASSESSMENT: Neutropenia  PLAN:    1. Neutropenia: Chronic and unchanged.  Suspect this may continue to be nutritional given her ongoing poor p.o. intake and weight loss.  All of her other laboratory work including folate, B12, iron stores, LDH, peripheral blood  flow cytometry, and neutrophil antibodies are either negative or within normal limits. [employer] Myeloid panel was not drawn and will draw with next laboratory encounter.  No intervention is needed at this time.  Patient does not require bone marrow biopsy.  Return to clinic in 6 months with repeat laboratory work and video assisted telemedicine visit.  2.  Weight loss: Chronic and unchanged.  Patient had CT of the chest and neck in January 2021 that did not reveal any distinct pathology.  Patient has also had endoscopic evaluation by GI that was reported as unrevealing.  She has instructed to continue her follow-up with GI as scheduled.  Continue encourage increased caloric intake.      I provided 20 minutes of face-to-face video visit time during this encounter which included chart review, counseling, and coordination of care as documented above.    Patient expressed understanding and was in agreement with this plan. She also understands that She can call clinic at any time with any questions, concerns, or complaints.    Lloyd Huger, MD   07/18/2020 4:08 PM

## 2020-07-16 ENCOUNTER — Other Ambulatory Visit: Payer: Self-pay

## 2020-07-16 ENCOUNTER — Inpatient Hospital Stay: Payer: Medicare HMO

## 2020-07-16 ENCOUNTER — Inpatient Hospital Stay: Payer: Medicare HMO | Admitting: Oncology

## 2020-07-16 DIAGNOSIS — E785 Hyperlipidemia, unspecified: Secondary | ICD-10-CM | POA: Diagnosis not present

## 2020-07-16 DIAGNOSIS — Z833 Family history of diabetes mellitus: Secondary | ICD-10-CM | POA: Diagnosis not present

## 2020-07-16 DIAGNOSIS — D709 Neutropenia, unspecified: Secondary | ICD-10-CM | POA: Diagnosis not present

## 2020-07-16 DIAGNOSIS — Z79899 Other long term (current) drug therapy: Secondary | ICD-10-CM | POA: Diagnosis not present

## 2020-07-16 DIAGNOSIS — K219 Gastro-esophageal reflux disease without esophagitis: Secondary | ICD-10-CM | POA: Diagnosis not present

## 2020-07-16 DIAGNOSIS — Z803 Family history of malignant neoplasm of breast: Secondary | ICD-10-CM | POA: Diagnosis not present

## 2020-07-16 DIAGNOSIS — Z8349 Family history of other endocrine, nutritional and metabolic diseases: Secondary | ICD-10-CM | POA: Diagnosis not present

## 2020-07-16 DIAGNOSIS — Z8249 Family history of ischemic heart disease and other diseases of the circulatory system: Secondary | ICD-10-CM | POA: Diagnosis not present

## 2020-07-16 DIAGNOSIS — I10 Essential (primary) hypertension: Secondary | ICD-10-CM | POA: Diagnosis not present

## 2020-07-16 DIAGNOSIS — Z90710 Acquired absence of both cervix and uterus: Secondary | ICD-10-CM | POA: Diagnosis not present

## 2020-07-16 LAB — CBC WITH DIFFERENTIAL/PLATELET
Abs Immature Granulocytes: 0 10*3/uL (ref 0.00–0.07)
Basophils Absolute: 0 10*3/uL (ref 0.0–0.1)
Basophils Relative: 1 %
Eosinophils Absolute: 0 10*3/uL (ref 0.0–0.5)
Eosinophils Relative: 1 %
HCT: 29 % — ABNORMAL LOW (ref 36.0–46.0)
Hemoglobin: 10.3 g/dL — ABNORMAL LOW (ref 12.0–15.0)
Immature Granulocytes: 0 %
Lymphocytes Relative: 48 %
Lymphs Abs: 1.1 10*3/uL (ref 0.7–4.0)
MCH: 30.5 pg (ref 26.0–34.0)
MCHC: 35.5 g/dL (ref 30.0–36.0)
MCV: 85.8 fL (ref 80.0–100.0)
Monocytes Absolute: 0.3 10*3/uL (ref 0.1–1.0)
Monocytes Relative: 14 %
Neutro Abs: 0.8 10*3/uL — ABNORMAL LOW (ref 1.7–7.7)
Neutrophils Relative %: 36 %
Platelets: 142 10*3/uL — ABNORMAL LOW (ref 150–400)
RBC: 3.38 MIL/uL — ABNORMAL LOW (ref 3.87–5.11)
RDW: 13.7 % (ref 11.5–15.5)
WBC: 2.1 10*3/uL — ABNORMAL LOW (ref 4.0–10.5)
nRBC: 0 % (ref 0.0–0.2)

## 2020-07-17 ENCOUNTER — Encounter: Payer: Self-pay | Admitting: Oncology

## 2020-07-17 ENCOUNTER — Inpatient Hospital Stay (HOSPITAL_BASED_OUTPATIENT_CLINIC_OR_DEPARTMENT_OTHER): Payer: Medicare HMO | Admitting: Oncology

## 2020-07-17 DIAGNOSIS — D709 Neutropenia, unspecified: Secondary | ICD-10-CM

## 2020-07-17 NOTE — Progress Notes (Signed)
Spoke with patient's son today to update information. Son states he has a lot of concerns about the patient's fear to swallow and eat. She has not been eating enough. He would like to discuss what options could help. Is feeding tube an option. States patient is still on stage 2 soft food diet due to fears of swallowing and getting choked. He denies patient is having any pain or other concerns at this time.

## 2020-08-06 ENCOUNTER — Telehealth: Payer: Self-pay

## 2020-08-06 NOTE — Telephone Encounter (Signed)
°  Chronic Care Management   Outreach Note  08/06/2020 Name: Maureen [name] MRN: [id number] DOB: 1946/02/27  Primary Care Provider: Alba Cory, MD Reason for referral : Chronic Care Management   Ms. Mucha was referred to the care management team for assistance with chronic care management and care coordination. Her primary care provider will be notified of our unsuccessful attempts to establish contact. The care management team will gladly outreach at any time in the future if she is interested in receiving assistance.  PLAN The care management team will gladly follow up with Ms. Boeve after the primary care provider has a conversation with her regarding recommendation for care management engagement and subsequent re-referral for care management services.    [facility] Care Management [facility] [phone]

## 2020-08-09 ENCOUNTER — Ambulatory Visit (INDEPENDENT_AMBULATORY_CARE_PROVIDER_SITE_OTHER): Payer: Medicare HMO

## 2020-08-09 DIAGNOSIS — Z0000 Encounter for general adult medical examination without abnormal findings: Secondary | ICD-10-CM | POA: Diagnosis not present

## 2020-08-09 NOTE — Progress Notes (Signed)
Subjective:   Maureen [name] is a 74 y.o. female who presents for Medicare Annual (Subsequent) preventive examination.  Virtual Visit via Telephone Note  I connected with  Maureen [name] on 08/09/20 at 11:20 AM EDT by telephone and verified that I am speaking with the correct person using two identifiers.  Medicare Annual Wellness visit completed telephonically due to Covid-19 pandemic.   Location: Patient: home Provider: [facility]   I discussed the limitations, risks, security and privacy concerns of performing an evaluation and management service by telephone and the availability of in person appointments. The patient expressed understanding and agreed to proceed.  Unable to perform video visit due to video visit attempted and failed and/or patient does not have video capability.   Some vital signs may be absent or patient reported.   Maureen Littler, LPN    Review of Systems     Cardiac Risk Factors include: advanced age (>69men, >50 women);sedentary lifestyle     Objective:    There were no vitals filed for this visit. There is no height or weight on file to calculate BMI.  Advanced Directives 08/09/2020 07/17/2020 04/30/2020 04/30/2020 03/07/2020 03/05/2020 02/13/2020  Does Patient Have a Medical Advance Directive? No Yes No No Yes Yes No;Yes  Type of Advance Directive - Healthcare Power of Attorney - - Living will Living will Living will  Does patient want to make changes to medical advance directive? - No - Patient declined - - No - Patient declined - -  Copy of Healthcare Power of Attorney in Chart? - No - copy requested - - - - -  Would patient like information on creating a medical advance directive? Yes (MAU/Ambulatory/Procedural Areas - Information given) - - - No - Patient declined No - Patient declined -    Current Medications (verified) Outpatient Encounter Medications as of 08/09/2020  Medication Sig  . clonazepam (KLONOPIN) 0.125 MG disintegrating tablet Take 1  tablet (0.125 mg total) by mouth 2 (two) times daily. (Patient taking differently: Take 0.125 mg by mouth daily. )  . Ensure (ENSURE) Take 237 mLs by mouth. Pt drinking approx 3 ensures per day  . levothyroxine (SYNTHROID) 88 MCG tablet Take 1 tablet (88 mcg total) by mouth daily before breakfast.  . omeprazole (PRILOSEC) 40 MG capsule Take 1 capsule (40 mg total) by mouth daily before breakfast.  . Ascorbic Acid (VITAMIN C) 1000 MG tablet Take 1,000 mg by mouth daily.  (Patient not taking: Reported on 08/09/2020)  . Garlic 1000 MG CAPS Take 1 capsule by mouth daily.  (Patient not taking: Reported on 05/29/2020)  . Iron-Vitamins (GERITOL COMPLETE PO) Take 1 tablet by mouth daily. (Patient not taking: Reported on 05/29/2020)  . mirtazapine (REMERON) 15 MG tablet 7.5 mg at night for two weeks, then 15 mg at night (Patient not taking: Reported on 05/29/2020)  . TURMERIC CURCUMIN PO Take 550 mg by mouth daily. (Patient not taking: Reported on 05/29/2020)  . [DISCONTINUED] lidocaine (XYLOCAINE) 2 % solution Use as directed 15 mLs in the mouth or throat every 6 (six) hours as needed for mouth pain.   No facility-administered encounter medications on file as of 08/09/2020.    Allergies (verified) Aspirin, Citalopram, Codeine, and Penicillins   History: Past Medical History:  Diagnosis Date  . Allergy   . Corn of toe   . Depression   . First degree AV block   . GERD (gastroesophageal reflux disease)   . Hammer toe of right foot   .  Hyperglycemia   . Hyperlipidemia   . Hypertension   . Loss of appetite   . Prolapse of urethra   . Scoliosis (and kyphoscoliosis), idiopathic   . Thyroid disease   . Tinnitus of both ears   . Vitreous floaters of right eye    Past Surgical History:  Procedure Laterality Date  . ABDOMINAL HYSTERECTOMY  1989  . APPENDECTOMY  1989  . BREAST EXCISIONAL BIOPSY Bilateral    multiple biopsies negative  . BREAST SURGERY     Several  . COLONOSCOPY WITH PROPOFOL N/A  07/28/2019   Procedure: COLONOSCOPY WITH PROPOFOL;  Surgeon: Toney ReilVanga, Maureen Reddy, MD;  [facility];  Service: Gastroenterology;  Laterality: N/A;  . ESOPHAGOGASTRODUODENOSCOPY (EGD) WITH PROPOFOL N/A 11/11/2019   Procedure: ESOPHAGOGASTRODUODENOSCOPY (EGD) WITH PROPOFOL;  Surgeon: Toney ReilVanga, Maureen Reddy, MD;  [facility];  Service: Gastroenterology;  Laterality: N/A;  . TUMOR EXCISION  1989   same time as hysterectomy   Family History  Problem Relation Age of Onset  . Diabetes Father 6076       DM complications  . Hypertension Mother [id number]       CKD  . Thyroid disease Mother   . Heart failure Mother   . Kidney disease Mother   . Depression Mother   . Breast cancer Sister [id number]  . Depression Sister   . Kidney disease Other   . Breast cancer Maternal Aunt   . Breast cancer Cousin   . Anuerysm Son 51  . Diabetes Son    Social History   Socioeconomic History  . Marital status: Widowed    Spouse name: Not on file  . Number of children: 3  . Years of education: 2012  . Highest education level: 12th grade  Occupational History  . Occupation: Retired  Tobacco Use  . Smoking status: Never Smoker  . Smokeless tobacco: Never Used  . Tobacco comment: smoking cessation materials not required  Vaping Use  . Vaping Use: Never used  Substance and Sexual Activity  . Alcohol use: Never    Alcohol/week: 0.0 standard drinks  . Drug use: No  . Sexual activity: Not Currently    Partners: Male  Other Topics Concern  . Not on file  Social History Narrative   Pt lives alone; son Maureen CholRoderick nearby and helps almost daily   Social Determinants of Health   Financial Resource Strain: Low Risk   . Difficulty of Paying Living Expenses: Not hard at all  Food Insecurity: No Food Insecurity  . Worried About Programme researcher, broadcasting/film/videounning Out of Food in the Last Year: Never true  . Ran Out of Food in the Last Year: Never true  Transportation Needs: No Transportation Needs  . Lack of Transportation  (Medical): No  . Lack of Transportation (Non-Medical): No  Physical Activity: Inactive  . Days of Exercise per Week: 0 days  . Minutes of Exercise per Session: 0 min  Stress: Stress Concern Present  . Feeling of Stress : Rather much  Social Connections: Moderately Integrated  . Frequency of Communication with Friends and Family: More than three times a week  . Frequency of Social Gatherings with Friends and Family: More than three times a week  . Attends Religious Services: More than 4 times per year  . Active Member of Clubs or Organizations: Yes  . Attends BankerClub or Organization Meetings: More than 4 times per year  . Marital Status: Widowed    Tobacco Counseling Counseling given: Not Answered Comment: smoking cessation materials not  required   Clinical Intake:  Pre-visit preparation completed: Yes  Pain : No/denies pain     Nutritional Status: BMI <19  Underweight Nutritional Risks: Failure to thrive Diabetes: No  How often do you need to have someone help you when you read instructions, pamphlets, or other written materials from your doctor or pharmacy?: 1 - Never    Interpreter Needed?: No  Information entered by :: Maureen Littler LPN   Activities of Daily Living In your present state of health, do you have any difficulty performing the following activities: 08/09/2020 05/29/2020  Hearing? N N  Comment declines hearing aids -  Vision? N N  Difficulty concentrating or making decisions? N Y  Walking or climbing stairs? N N  Dressing or bathing? N Y  Doing errands, shopping? N Y  Quarry manager and eating ? N -  Using the Toilet? N -  In the past six months, have you accidently leaked urine? N -  Do you have problems with loss of bowel control? N -  Managing your Medications? N -  Managing your Finances? N -  Housekeeping or managing your Housekeeping? N -  Some recent data might be hidden    Patient Care Team: Alba Cory, MD as PCP - General (Family  Medicine) Isla Pence, OD as Consulting Physician (Optometry) Dew, Marlow Baars, MD as Consulting Physician (Vascular Surgery) Jeralyn Ruths, MD as Consulting Physician (Oncology) Nadara Mustard, MD as Referring Physician (Obstetrics and Gynecology) Neysa Hotter, MD as Consulting Physician (Psychiatry)  Indicate any recent Medical Services you may have received from other than [facility] providers in the past year (date may be approximate).     Assessment:   This is a routine wellness examination for Miangel.  Hearing/Vision screen  Hearing Screening   125Hz  250Hz  500Hz  1000Hz  2000Hz  3000Hz  4000Hz  6000Hz  8000Hz   Right ear:           Left ear:           Comments: Pt denies hearing difficulty  Vision Screening Comments: [facility] screen[facility]  Dietary issues and exercise activities discussed: Current Exercise Habits: The patient does not participate in regular exercise at present, Exercise limited by: psychological condition(s)  Goals    . DIET - INCREASE WATER INTAKE     Recommend to drink at least 6-8 8oz glasses of water per day.      Depression Screen PHQ 2/9 Scores 08/09/2020 04/27/2020 03/22/2020 03/15/2020 01/20/2020 12/22/2019 11/30/2019  PHQ - 2 Score 4 4 6 3 3 4 3   PHQ- 9 Score 12 13 24 13 11 15 12     Fall Risk Fall Risk  08/09/2020 05/29/2020 04/27/2020 03/22/2020 03/15/2020  Falls in the past year? 0 0 0 0 0  Number falls in past yr: 0 0 0 0 0  Injury with Fall? 0 0 0 0 0  Risk for fall due to : No Fall Risks - - - -  Risk for fall due to: Comment - - - - -  Follow up - - - Falls evaluation completed -    Any stairs in or around the home? Yes  If so, are there any without handrails? No  Home free of loose throw rugs in walkways, pet beds, electrical cords, etc? Yes  Adequate lighting in your home to reduce risk of falls? Yes   ASSISTIVE DEVICES UTILIZED TO PREVENT FALLS:  Life alert? No  Use of a cane, walker or w/c? Yes  Grab bars in  the bathroom? Yes  Shower chair or bench in shower? No  Elevated toilet seat or a handicapped toilet? No   TIMED UP AND GO:  Was the test performed? No . Telephonic visit.   Cognitive Function:     6CIT Screen 08/09/2020 08/05/2019 05/14/2018  What Year? 0 points 0 points 0 points  What month? 0 points 0 points 0 points  What time? 0 points 0 points 0 points  Count back from 20 0 points 0 points 0 points  Months in reverse 4 points 0 points 4 points  Repeat phrase 4 points 4 points 10 points  Total Score Immunizations There is no immunization history for the selected administration types on file for this patient.  TDAP status: Due, Education has been provided regarding the importance of this vaccine. Advised may receive this vaccine at local pharmacy or Health Dept. Aware to provide a copy of the vaccination record if obtained from local pharmacy or Health Dept. Verbalized acceptance and understanding.   Flu Vaccine status: Declined, Education has been provided regarding the importance of this vaccine but patient still declined. Advised may receive this vaccine at local pharmacy or Health Dept. Aware to provide a copy of the vaccination record if obtained from local pharmacy or Health Dept. Verbalized acceptance and understanding.   Pneumococcal vaccine status: Declined,  Education has been provided regarding the importance of this vaccine but patient still declined. Advised may receive this vaccine at local pharmacy or Health Dept. Aware to provide a copy of the vaccination record if obtained from local pharmacy or Health Dept. Verbalized acceptance and understanding.    Covid-19 vaccine status: Declined, Education has been provided regarding the importance of this vaccine but patient still declined. Advised may receive this vaccine at local pharmacy or Health Dept.or vaccine clinic. Aware to provide a copy of the vaccination record if obtained from local pharmacy or Health  Dept. Verbalized acceptance and understanding.  Qualifies for Shingles Vaccine? Yes   Zostavax completed No   Shingrix Completed?: No.    Education has been provided regarding the importance of this vaccine. Patient has been advised to call insurance company to determine out of pocket expense if they have not yet received this vaccine. Advised may also receive vaccine at local pharmacy or Health Dept. Verbalized acceptance and understanding.  Screening Tests Health Maintenance  Topic Date Due  . COVID-19 Vaccine (1) Never done  . TETANUS/TDAP  Never done  . PNA vac Low Risk Adult (1 of 2 - PCV13) Never done  . INFLUENZA VACCINE  Never done  . DEXA SCAN  08/18/2029 (Originally Mar 30, 1946)  . MAMMOGRAM  02/14/2021  . COLONOSCOPY  07/27/2029  . Hepatitis C Screening  Completed    Health Maintenance  Health Maintenance Due  Topic Date Due  . COVID-19 Vaccine (1) Never done  . TETANUS/TDAP  Never done  . PNA vac Low Risk Adult (1 of 2 - PCV13) Never done  . INFLUENZA VACCINE  Never done    Colorectal cancer screening: Completed 07/28/19. Repeat every 10 years   Mammogram status: Completed 02/15/20. Repeat every year   Bone density status: due - patient given contact information to schedule appt   Lung Cancer Screening: (Low Dose CT Chest recommended if Age 97-80 years, 30 pack-year currently smoking OR have quit w/in 15years.) does not qualify.    Additional Screening:  Hepatitis C Screening: does qualify; Completed 04/26/12  Vision Screening: Recommended  annual ophthalmology exams for early detection of glaucoma and other disorders of the eye. Is the patient up to date with their annual eye exam?  Yes  Who is the provider or what is the name of the office in which the patient attends annual eye exams? Dr. Clydene Pugh  Dental Screening: Recommended annual dental exams for proper oral hygiene  Community Resource Referral / Chronic Care Management: CRR required this visit?  No    CCM required this visit?  Yes      Plan:     I have personally reviewed and noted the following in the patient's chart:   . Medical and social history . Use of alcohol, tobacco or illicit drugs  . Current medications and supplements . Functional ability and status . Nutritional status . Physical activity . Advanced directives . List of other physicians . Hospitalizations, surgeries, and ER visits in previous 12 months . Vitals . Screenings to include cognitive, depression, and falls . Referrals and appointments  In addition, I have reviewed and discussed with patient certain preventive protocols, quality metrics, and best practice recommendations. A written personalized care plan for preventive services as well as general preventive health recommendations were provided to patient.     Maureen Littler, LPN   [id number]   Nurse Notes: pt accompanied during visit by her son Maureen Chol who is concerned about patient's lack of appetite and poor nutritional intake. Pt is currently only drinking Ensure (3x per day) and stage 2 baby food. Dr. Carlynn Purl previously referred pt for CCM services and there have been unsuccessful outreach attempts made by Gae Dry RN. Message sent to [facility] to follow up due to patient and her son are interested in CCM services for care coordination and nutrition information.

## 2020-08-09 NOTE — Patient Instructions (Signed)
Maureen [name] , Thank you for taking time to come for your Medicare Wellness Visit. I appreciate your ongoing commitment to your health goals. Please review the following plan we discussed and let me know if I can assist you in the future.   Screening recommendations/referrals: Colonoscopy: done 07/28/19 Mammogram: done 02/15/20 Bone Density: done 01/11/15. Please call [phone] to schedule your bone density screening.  Recommended yearly ophthalmology/optometry visit for glaucoma screening and checkup Recommended yearly dental visit for hygiene and checkup  Vaccinations: Influenza vaccine: declined Pneumococcal vaccine: declined Tdap vaccine: due Shingles vaccine: declined   Covid-19:declined  Advanced directives: Advance directive discussed with you today. I have provided a copy for you to complete at home and have notarized. Once this is complete please bring a copy in to our office so we can scan it into your chart.  Conditions/risks identified: Recommend increasing caloric and protein intake  Next appointment: Follow up in one year for your annual wellness visit    Preventive Care 65 Years and Older, Female Preventive care refers to lifestyle choices and visits with your health care provider that can promote health and wellness. What does preventive care include?  A yearly physical exam. This is also called an annual well check.  Dental exams once or twice a year.  Routine eye exams. Ask your health care provider how often you should have your eyes checked.  Personal lifestyle choices, including:  Daily care of your teeth and gums.  Regular physical activity.  Eating a healthy diet.  Avoiding tobacco and drug use.  Limiting alcohol use.  Practicing safe sex.  Taking low-dose aspirin every day.  Taking vitamin and mineral supplements as recommended by your health care provider. What happens during an annual well check? The services and screenings done by your health  care provider during your annual well check will depend on your age, overall health, lifestyle risk factors, and family history of disease. Counseling  Your health care provider may ask you questions about your:  Alcohol use.  Tobacco use.  Drug use.  Emotional well-being.  Home and relationship well-being.  Sexual activity.  Eating habits.  History of falls.  Memory and ability to understand (cognition).  Work and work Astronomer.  Reproductive health. Screening  You may have the following tests or measurements:  Height, weight, and BMI.  Blood pressure.  Lipid and cholesterol levels. These may be checked every 5 years, or more frequently if you are over 74 years old.  Skin check.  Lung cancer screening. You may have this screening every year starting at age 42 if you have a 30-pack-year history of smoking and currently smoke or have quit within the past 15 years.  Fecal occult blood test (FOBT) of the stool. You may have this test every year starting at age 31.  Flexible sigmoidoscopy or colonoscopy. You may have a sigmoidoscopy every 5 years or a colonoscopy every 10 years starting at age 65.  Hepatitis C blood test.  Hepatitis B blood test.  Sexually transmitted disease (STD) testing.  Diabetes screening. This is done by checking your blood sugar (glucose) after you have not eaten for a while (fasting). You may have this done every 1-3 years.  Bone density scan. This is done to screen for osteoporosis. You may have this done starting at age 7.  Mammogram. This may be done every 1-2 years. Talk to your health care provider about how often you should have regular mammograms. Talk with your health care provider about  your test results, treatment options, and if necessary, the need for more tests. Vaccines  Your health care provider may recommend certain vaccines, such as:  Influenza vaccine. This is recommended every year.  Tetanus, diphtheria, and  acellular pertussis (Tdap, Td) vaccine. You may need a Td booster every 10 years.  Zoster vaccine. You may need this after age 40.  Pneumococcal 13-valent conjugate (PCV13) vaccine. One dose is recommended after age 27.  Pneumococcal polysaccharide (PPSV23) vaccine. One dose is recommended after age 74. Talk to your health care provider about which screenings and vaccines you need and how often you need them. This information is not intended to replace advice given to you by your health care provider. Make sure you discuss any questions you have with your health care provider. Document Released: 11/09/2015 Document Revised: 07/02/2016 Document Reviewed: 08/14/2015 Elsevier Interactive Patient Education  2017 [place] Prevention in the Home Falls can cause injuries. They can happen to people of all ages. There are many things you can do to make your home safe and to help prevent falls. What can I do on the outside of my home?  Regularly fix the edges of walkways and driveways and fix any cracks.  Remove anything that might make you trip as you walk through a door, such as a raised step or threshold.  Trim any bushes or trees on the path to your home.  Use bright outdoor lighting.  Clear any walking paths of anything that might make someone trip, such as rocks or tools.  Regularly check to see if handrails are loose or broken. Make sure that both sides of any steps have handrails.  Any raised decks and porches should have guardrails on the edges.  Have any leaves, snow, or ice cleared regularly.  Use sand or salt on walking paths during winter.  Clean up any spills in your garage right away. This includes oil or grease spills. What can I do in the bathroom?  Use night lights.  Install grab bars by the toilet and in the tub and shower. Do not use towel bars as grab bars.  Use non-skid mats or decals in the tub or shower.  If you need to sit down in the shower, use  a plastic, non-slip stool.  Keep the floor dry. Clean up any water that spills on the floor as soon as it happens.  Remove soap buildup in the tub or shower regularly.  Attach bath mats securely with double-sided non-slip rug tape.  Do not have throw rugs and other things on the floor that can make you trip. What can I do in the bedroom?  Use night lights.  Make sure that you have a light by your bed that is easy to reach.  Do not use any sheets or blankets that are too big for your bed. They should not hang down onto the floor.  Have a firm chair that has side arms. You can use this for support while you get dressed.  Do not have throw rugs and other things on the floor that can make you trip. What can I do in the kitchen?  Clean up any spills right away.  Avoid walking on wet floors.  Keep items that you use a lot in easy-to-reach places.  If you need to reach something above you, use a strong step stool that has a grab bar.  Keep electrical cords out of the way.  Do not use floor polish or wax  that makes floors slippery. If you must use wax, use non-skid floor wax.  Do not have throw rugs and other things on the floor that can make you trip. What can I do with my stairs?  Do not leave any items on the stairs.  Make sure that there are handrails on both sides of the stairs and use them. Fix handrails that are broken or loose. Make sure that handrails are as long as the stairways.  Check any carpeting to make sure that it is firmly attached to the stairs. Fix any carpet that is loose or worn.  Avoid having throw rugs at the top or bottom of the stairs. If you do have throw rugs, attach them to the floor with carpet tape.  Make sure that you have a light switch at the top of the stairs and the bottom of the stairs. If you do not have them, ask someone to add them for you. What else can I do to help prevent falls?  Wear shoes that:  Do not have high heels.  Have  rubber bottoms.  Are comfortable and fit you well.  Are closed at the toe. Do not wear sandals.  If you use a stepladder:  Make sure that it is fully opened. Do not climb a closed stepladder.  Make sure that both sides of the stepladder are locked into place.  Ask someone to hold it for you, if possible.  Clearly mark and make sure that you can see:  Any grab bars or handrails.  First and last steps.  Where the edge of each step is.  Use tools that help you move around (mobility aids) if they are needed. These include:  Canes.  Walkers.  Scooters.  Crutches.  Turn on the lights when you go into a dark area. Replace any light bulbs as soon as they burn out.  Set up your furniture so you have a clear path. Avoid moving your furniture around.  If any of your floors are uneven, fix them.  If there are any pets around you, be aware of where they are.  Review your medicines with your doctor. Some medicines can make you feel dizzy. This can increase your chance of falling. Ask your doctor what other things that you can do to help prevent falls. This information is not intended to replace advice given to you by your health care provider. Make sure you discuss any questions you have with your health care provider. Document Released: 08/09/2009 Document Revised: 03/20/2016 Document Reviewed: 11/17/2014 Elsevier Interactive Patient Education  2017 [employer].

## 2020-08-15 ENCOUNTER — Telehealth: Payer: Self-pay | Admitting: Family Medicine

## 2020-08-15 NOTE — Telephone Encounter (Signed)
Pt is not eating and getting calories that she should each day / Pt consumes ensure, baby food and applesauce only/ Pts son has her on a schedule but Pt will not follow and seems to be losing weight / Pts son states its hard to get her to comply / Pts Son Fredrik Cove asked if the nurse of dr. Carlynn Purl can call the Pt and check in with her and ask about her eating/ please advise asap / he asked for a sooner appt with Dr. Carlynn Purl if possible as well or just a call until her November appt

## 2020-08-17 NOTE — Telephone Encounter (Signed)
Spoke with patient's son, Fernand Parkins, to see if he was able to find a nursing home for his mother.  He stated he is still in the process of looking.  He also stated that his mother has been doing a little better since his last call to our office.  I was able to move Maureen [name]'s appointment up to Nov. 4th at 11 with Dr. Carlynn Purl @ 11.

## 2020-08-29 NOTE — Progress Notes (Addendum)
Name: Maureen [name]   MRN: [id number]    DOB: 02/24/1946   Date:08/30/2020       Progress Note  Subjective  Chief Complaint  Follow up  HPI    Malnutrition: BMI is now down to 15.72 , she has lost 43 lbs since 07/2019 ( one year )  She has seen hematologist , gastroenterologist, psychiatrist. She continues to have difficulty and fear of swallowing. Now her bp is dropping, she denies dizziness.  She has a Metallurgist and multiple recommendations for weight gain/high calorie diet but worries about becoming diabetic  We filled out forms for her to be admitted for weight gain, however son got worried because of COVID and she was doing a little better, however she is getting worse again, she is only eating Ensure , baby food and apple sauce, but not sure how often since she lives alone. We have discussed eating disorder with patient and son. Son states he will try to get her admitted again. He is very concerned because he has been talking about having diabetes again.   MDD: she was seen by Dr. Salvatore Decent - psychiatrist , last visit 05/2020 and has been released per patient/son's request, she does not want to take Remeron - afraid of polypharmacy. She has been taking clonazepam prn only   Hypothyroidism: she has been taking medication daily now, last TSH was down for 12.52 to 0.97. She denies hair loss, no change in bowel movements, we will recheck her levels today   Paroxysmal Afib: she never started Eliquis, she states cardiologist called her for a visit but she did not schedule it. She denies chest pain or palpitation, she is afraid of taking medications  Atherosclerosis of aorta: found on CT chest , patient is not interested on statin therapy  Pancytopenia: she has been seeing Dr. Orlie Dakin, last lab work for follow up of leucopenia, showed pancytopenia with low platelets, white count and also showed anemia, he ordered myeloid Intelligen test but it has not been done yet   Scoliosis:  chronic, also has DDD based on CT chest , but she denies pain   Patient Active Problem List   Diagnosis Date Noted  . Paroxysmal atrial fibrillation (HCC) 03/22/2020  . Vitamin D deficiency 03/01/2020  . Esophageal dysphagia   . Arrhythmia 08/25/2019  . Rectal bleeding   . Low grade squamous intraepith lesion on cytologic smear cervix (lgsil) 08/04/2018  . Chronic venous insufficiency 07/05/2018  . Lymphedema 07/05/2018  . Hypertension 04/02/2018  . Swelling of limb 04/02/2018  . Depression, major, recurrent, mild (HCC) 12/29/2017  . Leukopenia 05/24/2017  . Allergic rhinitis, seasonal 06/29/2015  . Clavus 06/29/2015  . 1st degree AV block 06/29/2015  . Hammer toe 06/29/2015  . H/O: HTN (hypertension) 06/29/2015  . Blood glucose elevated 06/29/2015  . Floater, vitreous 06/29/2015  . Bilateral tinnitus 06/29/2015  . Hypothyroidism (acquired) 04/02/2015  . Gastroesophageal reflux disease 04/02/2015  . Hammer toe of right foot 04/02/2015  . Scoliosis of thoracic spine 04/02/2015  . Urethral prolapse 04/02/2015  . Corn of toe 04/02/2015  . Hyperlipidemia 04/02/2015  . Varicose veins of both lower extremities 04/02/2015    Past Surgical History:  Procedure Laterality Date  . ABDOMINAL HYSTERECTOMY  1989  . APPENDECTOMY  1989  . BREAST EXCISIONAL BIOPSY Bilateral    multiple biopsies negative  . BREAST SURGERY     Several  . COLONOSCOPY WITH PROPOFOL N/A 07/28/2019   Procedure: COLONOSCOPY WITH PROPOFOL;  Surgeon: Lannette Donath  Betti Cruz, MD;  Location: [facility] ENDOSCOPY;  Service: Gastroenterology;  Laterality: N/A;  . ESOPHAGOGASTRODUODENOSCOPY (EGD) WITH PROPOFOL N/A 11/11/2019   Procedure: ESOPHAGOGASTRODUODENOSCOPY (EGD) WITH PROPOFOL;  Surgeon: Toney Reil, MD;  Location: [facility] ENDOSCOPY;  Service: Gastroenterology;  Laterality: N/A;  . TUMOR EXCISION  1989   same time as hysterectomy    Family History  Problem Relation Age of Onset  . Diabetes Father 67       DM  complications  . Hypertension Mother 38       CKD  . Thyroid disease Mother   . Heart failure Mother   . Kidney disease Mother   . Depression Mother   . Breast cancer Sister 69  . Depression Sister   . Kidney disease Other   . Breast cancer Maternal Aunt   . Breast cancer Cousin   . Anuerysm Son 51  . Diabetes Son     Social History   Tobacco Use  . Smoking status: Never Smoker  . Smokeless tobacco: Never Used  . Tobacco comment: smoking cessation materials not required  Substance Use Topics  . Alcohol use: Never    Alcohol/week: 0.0 standard drinks     Current Outpatient Medications:  .  clonazepam (KLONOPIN) 0.125 MG disintegrating tablet, Take 1 tablet (0.125 mg total) by mouth 2 (two) times daily. (Patient taking differently: Take 0.125 mg by mouth daily. ), Disp: 60 tablet, Rfl: 0 .  Ensure (ENSURE), Take 237 mLs by mouth. Pt drinking approx 3 ensures per day, Disp: , Rfl:  .  levothyroxine (SYNTHROID) 88 MCG tablet, Take 1 tablet (88 mcg total) by mouth daily before breakfast., Disp: 90 tablet, Rfl: 0 .  omeprazole (PRILOSEC) 40 MG capsule, Take 1 capsule (40 mg total) by mouth daily before breakfast., Disp: 90 capsule, Rfl: 3 .  Ascorbic Acid (VITAMIN C) 1000 MG tablet, Take 1,000 mg by mouth daily.  (Patient not taking: Reported on 08/09/2020), Disp: , Rfl:  .  Garlic 1000 MG CAPS, Take 1 capsule by mouth daily.  (Patient not taking: Reported on 05/29/2020), Disp: , Rfl:  .  Iron-Vitamins (GERITOL COMPLETE PO), Take 1 tablet by mouth daily. (Patient not taking: Reported on 05/29/2020), Disp: , Rfl:  .  mirtazapine (REMERON) 15 MG tablet, 7.5 mg at night for two weeks, then 15 mg at night (Patient not taking: Reported on 05/29/2020), Disp: 30 tablet, Rfl: 1 .  TURMERIC CURCUMIN PO, Take 550 mg by mouth daily. (Patient not taking: Reported on 05/29/2020), Disp: , Rfl:   Allergies  Allergen Reactions  . Aspirin     Tinnitus  . Citalopram Other (See Comments)  . Codeine Nausea  And Vomiting  . Penicillins Rash    Has patient had a PCN reaction causing immediate rash, facial/tongue/throat swelling, SOB or lightheadedness with hypotension: Yes Has patient had a PCN reaction causing severe rash involving mucus membranes or skin necrosis: No Has patient had a PCN reaction that required hospitalization No Has patient had a PCN reaction occurring within the last 10 years: No If all of the above answers are "NO", then may proceed with Cephalosporin use.    I personally reviewed active problem list, medication list, allergies, family history, social history, health maintenance with the patient/caregiver today.   ROS  Constitutional: Negative for fever or weight change.  Respiratory: Negative for cough and shortness of breath.   Cardiovascular: Negative for chest pain or palpitations.  Gastrointestinal: Negative for abdominal pain, no bowel changes.  Musculoskeletal: Negative for gait  problem or joint swelling.  Skin: Negative for rash.  Neurological: Negative for dizziness or headache.  No other specific complaints in a complete review of systems (except as listed in HPI above).  Objective  Vitals:   08/30/20 1115  BP: 108/70  Pulse: 62  Resp: 14  Temp: 97.6 F (36.4 C)  TempSrc: Oral  SpO2: 98%  Weight: 83 lb 3.2 oz (37.7 kg)  Height: 5\' 1"  (1.549 m)    Body mass index is 15.72 kg/m.  Physical Exam  Constitutional: Patient appears frail and malnourished.  No distress.  HEENT: head atraumatic, normocephalic, pupils equal and reactive to light, neck supple Cardiovascular: Normal rate, regular rhythm and normal heart sounds.  No murmur heard. No BLE edema. Pulmonary/Chest: Effort normal and breath sounds normal. No respiratory distress. Abdominal: Soft.  There is no tenderness. Psychiatric: Patient very quiet again today, but cooperative  Recent Results (from the past 2160 hour(s))  CBC with Differential/Platelet     Status: Abnormal   Collection  Time: 07/16/20  4:00 PM  Result Value Ref Range   WBC 2.1 (L) 4.0 - 10.5 K/uL   RBC 3.38 (L) 3.87 - 5.11 MIL/uL   Hemoglobin 10.3 (L) 12.0 - 15.0 g/dL   HCT 16.129.0 (L) 36 - 46 %   MCV 85.8 80.0 - 100.0 fL   MCH 30.5 26.0 - 34.0 pg   MCHC 35.5 30.0 - 36.0 g/dL   RDW 09.613.7 04.511.5 - 40.915.5 %   Platelets 142 (L) 150 - 400 K/uL   nRBC 0.0 0.0 - 0.2 %   Neutrophils Relative % 36 %   Neutro Abs 0.8 (L) 1.7 - 7.7 K/uL   Lymphocytes Relative 48 %   Lymphs Abs 1.1 0.7 - 4.0 K/uL   Monocytes Relative 14 %   Monocytes Absolute 0.3 0.1 - 1.0 K/uL   Eosinophils Relative 1 %   Eosinophils Absolute 0.0 0.0 - 0.5 K/uL   Basophils Relative 1 %   Basophils Absolute 0.0 0.0 - 0.1 K/uL   Immature Granulocytes 0 %   Abs Immature Granulocytes 0.00 0.00 - 0.07 K/uL    Comment: Performed at [facility], [address] [place], [place] [address]    PHQ2/9: [facility] screen [facility] 08/30/2020 08/09/2020 04/27/2020 03/22/2020 03/15/2020  Decreased Interest 3 2 2 3 2   Down, Depressed, Hopeless 3 2 2 3 1   PHQ - 2 Score 6 4 4 6 3   Altered sleeping 0 0 2 3 2   Tired, decreased energy 1 1 2 3 2   Change in appetite 2 3 3 3 2   Feeling bad or failure about yourself  3 2 0 3 1  Trouble concentrating 0 2 1 3 1   Moving slowly or fidgety/restless 2 0 1 3 2   Suicidal thoughts 0 0 0 0 0  PHQ-9 Score 14 12 13 24 13   Difficult doing work/chores Very difficult Somewhat difficult Somewhat difficult Very difficult Somewhat difficult  Some recent data might be hidden    phq 9 is positive   Fall Risk: Fall Risk  08/30/2020 08/09/2020 05/29/2020 04/27/2020 03/22/2020  Falls in the past year? 0 0 0 0 0  Number falls in past yr: 0 0 0 0 0  Injury with Fall? 0 0 0 0 0  Risk for fall due to : - No Fall Risks - - -  Risk for fall due to: Comment - - - - -  Follow up - - - - Falls evaluation completed  Functional Status Survey: Is the patient deaf or have difficulty hearing?: No Does the patient have difficulty seeing,  even when wearing glasses/contacts?: No Does the patient have difficulty concentrating, remembering, or making decisions?: Yes Does the patient have difficulty walking or climbing stairs?: No Does the patient have difficulty dressing or bathing?: No Does the patient have difficulty doing errands alone such as visiting a doctor's office or shopping?: No    Assessment & Plan  1. Pancytopenia (HCC)  - Vitamin B12 - CBC with Differential/Platelet  2. Mild episode of recurrent major depressive disorder (HCC)  Refused medication  3. Paroxysmal atrial fibrillation [facility])  Did not follow up with cardiologist   4. Gastroesophageal reflux disease with esophagitis without hemorrhage   5. Atherosclerosis of aorta (HCC)  Refuses statin therapy   6. Hypothyroidism (acquired)  - TSH  7. Severe protein-energy malnutrition (HCC)  - Magnesium - COMPLETE METABOLIC PANEL WITH GFR - Vitamin B12 - VITAMIN D 25 Hydroxy (Vit-D Deficiency, Fractures) - CBC with Differential/Platelet  8. Scoliosis of thoracic spine, unspecified scoliosis type   9. Vitamin D deficiency  - VITAMIN D 25 Hydroxy (Vit-D Deficiency, Fractures)   10. Avoidant-restrictive food intake disorder (ARFID)   Patient and son refused all vaccines

## 2020-08-30 ENCOUNTER — Other Ambulatory Visit: Payer: Self-pay

## 2020-08-30 ENCOUNTER — Encounter: Payer: Self-pay | Admitting: Family Medicine

## 2020-08-30 ENCOUNTER — Ambulatory Visit (INDEPENDENT_AMBULATORY_CARE_PROVIDER_SITE_OTHER): Payer: Medicare HMO | Admitting: Family Medicine

## 2020-08-30 VITALS — BP 108/70 | HR 62 | Temp 97.6°F | Resp 14 | Ht 61.0 in | Wt 83.2 lb

## 2020-08-30 DIAGNOSIS — E559 Vitamin D deficiency, unspecified: Secondary | ICD-10-CM

## 2020-08-30 DIAGNOSIS — D61818 Other pancytopenia: Secondary | ICD-10-CM | POA: Diagnosis not present

## 2020-08-30 DIAGNOSIS — K2100 Gastro-esophageal reflux disease with esophagitis, without bleeding: Secondary | ICD-10-CM | POA: Diagnosis not present

## 2020-08-30 DIAGNOSIS — E039 Hypothyroidism, unspecified: Secondary | ICD-10-CM | POA: Diagnosis not present

## 2020-08-30 DIAGNOSIS — I700 Atherosclerosis of aorta: Secondary | ICD-10-CM

## 2020-08-30 DIAGNOSIS — E43 Unspecified severe protein-calorie malnutrition: Secondary | ICD-10-CM

## 2020-08-30 DIAGNOSIS — F5082 Avoidant/restrictive food intake disorder: Secondary | ICD-10-CM

## 2020-08-30 DIAGNOSIS — M419 Scoliosis, unspecified: Secondary | ICD-10-CM | POA: Diagnosis not present

## 2020-08-30 DIAGNOSIS — I480 Paroxysmal atrial fibrillation: Secondary | ICD-10-CM | POA: Diagnosis not present

## 2020-08-30 DIAGNOSIS — F330 Major depressive disorder, recurrent, mild: Secondary | ICD-10-CM | POA: Diagnosis not present

## 2020-08-31 LAB — COMPLETE METABOLIC PANEL WITH GFR
AG Ratio: 1.3 (calc) (ref 1.0–2.5)
ALT: 17 U/L (ref 6–29)
AST: 29 U/L (ref 10–35)
Albumin: 3.8 g/dL (ref 3.6–5.1)
Alkaline phosphatase (APISO): 70 U/L (ref 37–153)
BUN/Creatinine Ratio: 18 (calc) (ref 6–22)
BUN: 9 mg/dL (ref 7–25)
CO2: 33 mmol/L — ABNORMAL HIGH (ref 20–32)
Calcium: 9.2 mg/dL (ref 8.6–10.4)
Chloride: 91 mmol/L — ABNORMAL LOW (ref 98–110)
Creat: 0.51 mg/dL — ABNORMAL LOW (ref 0.60–0.93)
GFR, Est African American: 110 mL/min/{1.73_m2} (ref >=60)
GFR, Est Non African American: 95 mL/min/{1.73_m2} (ref >=60)
Globulin: 3 g/dL (calc) (ref 1.9–3.7)
Glucose, Bld: 101 mg/dL — ABNORMAL HIGH (ref 65–99)
Potassium: 4.5 mmol/L (ref 3.5–5.3)
Sodium: 131 mmol/L — ABNORMAL LOW (ref 135–146)
Total Bilirubin: 0.4 mg/dL (ref 0.2–1.2)
Total Protein: 6.8 g/dL (ref 6.1–8.1)

## 2020-08-31 LAB — CBC WITH DIFFERENTIAL/PLATELET
Absolute Monocytes: 266 cells/uL (ref 200–950)
Basophils Absolute: 20 cells/uL (ref 0–200)
Basophils Relative: 1 %
Eosinophils Absolute: 10 cells/uL — ABNORMAL LOW (ref 15–500)
Eosinophils Relative: 0.5 %
HCT: 31.5 % — ABNORMAL LOW (ref 35.0–45.0)
Hemoglobin: 10.8 g/dL — ABNORMAL LOW (ref 11.7–15.5)
Lymphs Abs: 820 cells/uL — ABNORMAL LOW (ref 850–3900)
MCH: 32.4 pg (ref 27.0–33.0)
MCHC: 34.3 g/dL (ref 32.0–36.0)
MCV: 94.6 fL (ref 80.0–100.0)
MPV: 12.9 fL — ABNORMAL HIGH (ref 7.5–12.5)
Monocytes Relative: 13.3 %
Neutro Abs: 884 cells/uL — ABNORMAL LOW (ref 1500–7800)
Neutrophils Relative %: 44.2 %
Platelets: 138 10*3/uL — ABNORMAL LOW (ref 140–400)
RBC: 3.33 10*6/uL — ABNORMAL LOW (ref 3.80–5.10)
RDW: 13.6 % (ref 11.0–15.0)
Total Lymphocyte: 41 %
WBC: 2 10*3/uL — ABNORMAL LOW (ref 3.8–10.8)

## 2020-08-31 LAB — VITAMIN B12: Vitamin B-12: 875 pg/mL (ref 200–1100)

## 2020-08-31 LAB — TSH: TSH: 15.14 mIU/L — ABNORMAL HIGH (ref 0.40–4.50)

## 2020-08-31 LAB — VITAMIN D 25 HYDROXY (VIT D DEFICIENCY, FRACTURES): Vit D, 25-Hydroxy: 20 ng/mL — ABNORMAL LOW (ref 30–100)

## 2020-08-31 LAB — MAGNESIUM: Magnesium: 2.3 mg/dL (ref 1.5–2.5)

## 2020-09-04 ENCOUNTER — Telehealth: Payer: Self-pay

## 2020-09-04 NOTE — Telephone Encounter (Signed)
Copied from CRM [id number]. Topic: General - Call Back - No Documentation >> Sep 04, 2020  1:28 PM Randol Kern wrote: Best contact: [phone]  Kerry Dory calling on behalf of [facility].   This patient has been referred for an eating disorder. They are unable to offer treatment because they only service ages 89-30. Due to her condition they were unable to see her in their outpatient clinic they do not see anyone with BMIs below 17.

## 2020-09-05 ENCOUNTER — Ambulatory Visit: Payer: Medicare HMO

## 2020-09-05 ENCOUNTER — Ambulatory Visit: Payer: Medicare HMO | Admitting: Family Medicine

## 2020-09-07 ENCOUNTER — Ambulatory Visit: Payer: Self-pay

## 2020-09-07 ENCOUNTER — Telehealth: Payer: Self-pay | Admitting: *Deleted

## 2020-09-07 DIAGNOSIS — F5082 Avoidant/restrictive food intake disorder: Secondary | ICD-10-CM

## 2020-09-07 NOTE — Chronic Care Management (AMB) (Signed)
  Chronic Care Management   Outreach Note  09/07/2020 Name: Maureen [name] MRN: [id number] DOB: [birth year]  Primary Care Provider: Alba Cory, MD Reason for referral : Chronic Care Management   An unsuccessful telephone outreach was attempted today. [name] was referred to the case management team for assistance with care management and care coordination related to malnourishment and facility placement.   Unable to leave a voice message for [name] today due to her voice mailbox being full.   Follow Up Plan:  Will attempt to reach [name] again within the next week. Will forward referral to CCM LCSW regarding need for facility placement.     [facility] Care Management [facility] [phone]

## 2020-09-07 NOTE — Chronic Care Management (AMB) (Signed)
   Chronic Care Management   Outreach Note   Name: MEILY GLOWACKI MRN: [id number] DOB: 1946-10-27  Primary Care Provider: Alba Cory, MD Reason for referral : Chronic Care Management    Ms. Schobert was referred to the case management team for assistance with care management and care coordination related to malnutrition and facility placement . I was unable to connect with Ms. Vanvleck but able to outreach with her son today. Reports that the primary concern is receiving assistance with facility placement. Reports that Ms. Parkhill's weight continues to decline due to her fear of consuming solid foods. States her current diet primarily consists of Ensure, Stage II baby foods and apple sauce. He reports that her mental and eating disorder has been addressed by the Psychiatry team but feels she would benefit from facility placement to assist with malnourishment.  Indicated that several referrals for placement have been declined with the most recent being [facility]. He is aware of indications for seeking immediate medical attention and plans to report to the nearest hospital if needed.     Follow Up Plan:  Will attempt to reach Ms. Swim later this week and update the CCM LCSW regarding request for placement.   [facility] Care Management [facility] [phone]

## 2020-09-07 NOTE — Chronic Care Management (AMB) (Signed)
  Care Management   Note  09/07/2020 Name: Maureen [name] MRN: [id number] DOB: [birth year]  Maureen [name] is a 74 y.o. year old female who is a primary care patient of Alba Cory, MD and is actively engaged with the care management team. I reached out to [name] by phone today to assist with scheduling an initial visit with the Licensed Clinical Child psychotherapist.  Follow up plan: Telephone appointment with care management team member scheduled for:09/12/2020  [facility] Guide, Embedded Care Coordination [facility]  Care Management  Direct Dial: [phone]

## 2020-09-11 ENCOUNTER — Other Ambulatory Visit: Payer: Self-pay

## 2020-09-11 ENCOUNTER — Encounter: Payer: Self-pay | Admitting: Emergency Medicine

## 2020-09-11 ENCOUNTER — Inpatient Hospital Stay
Admission: EM | Admit: 2020-09-11 | Discharge: 2020-09-19 | DRG: 640 | Disposition: A | Discharge status: Skilled Nursing Facility | Payer: Medicare HMO | Attending: Internal Medicine | Admitting: Internal Medicine

## 2020-09-11 ENCOUNTER — Inpatient Hospital Stay: Payer: Medicare HMO

## 2020-09-11 DIAGNOSIS — R64 Cachexia: Secondary | ICD-10-CM | POA: Diagnosis present

## 2020-09-11 DIAGNOSIS — R531 Weakness: Secondary | ICD-10-CM | POA: Diagnosis not present

## 2020-09-11 DIAGNOSIS — I480 Paroxysmal atrial fibrillation: Secondary | ICD-10-CM | POA: Diagnosis present

## 2020-09-11 DIAGNOSIS — D72819 Decreased white blood cell count, unspecified: Secondary | ICD-10-CM | POA: Diagnosis not present

## 2020-09-11 DIAGNOSIS — E43 Unspecified severe protein-calorie malnutrition: Secondary | ICD-10-CM | POA: Insufficient documentation

## 2020-09-11 DIAGNOSIS — K219 Gastro-esophageal reflux disease without esophagitis: Secondary | ICD-10-CM | POA: Diagnosis present

## 2020-09-11 DIAGNOSIS — J9819 Other pulmonary collapse: Secondary | ICD-10-CM | POA: Diagnosis present

## 2020-09-11 DIAGNOSIS — K224 Dyskinesia of esophagus: Secondary | ICD-10-CM

## 2020-09-11 DIAGNOSIS — I10 Essential (primary) hypertension: Secondary | ICD-10-CM | POA: Diagnosis not present

## 2020-09-11 DIAGNOSIS — Z79890 Hormone replacement therapy (postmenopausal): Secondary | ICD-10-CM

## 2020-09-11 DIAGNOSIS — E559 Vitamin D deficiency, unspecified: Secondary | ICD-10-CM | POA: Diagnosis present

## 2020-09-11 DIAGNOSIS — E871 Hypo-osmolality and hyponatremia: Secondary | ICD-10-CM | POA: Diagnosis not present

## 2020-09-11 DIAGNOSIS — R279 Unspecified lack of coordination: Secondary | ICD-10-CM | POA: Diagnosis not present

## 2020-09-11 DIAGNOSIS — F419 Anxiety disorder, unspecified: Secondary | ICD-10-CM | POA: Diagnosis not present

## 2020-09-11 DIAGNOSIS — R54 Age-related physical debility: Secondary | ICD-10-CM | POA: Diagnosis present

## 2020-09-11 DIAGNOSIS — R5381 Other malaise: Secondary | ICD-10-CM | POA: Diagnosis not present

## 2020-09-11 DIAGNOSIS — R1314 Dysphagia, pharyngoesophageal phase: Secondary | ICD-10-CM | POA: Diagnosis present

## 2020-09-11 DIAGNOSIS — M6281 Muscle weakness (generalized): Secondary | ICD-10-CM | POA: Diagnosis not present

## 2020-09-11 DIAGNOSIS — Z66 Do not resuscitate: Secondary | ICD-10-CM | POA: Diagnosis present

## 2020-09-11 DIAGNOSIS — F5082 Avoidant/restrictive food intake disorder: Secondary | ICD-10-CM | POA: Diagnosis not present

## 2020-09-11 DIAGNOSIS — Z681 Body mass index (BMI) 19 or less, adult: Secondary | ICD-10-CM | POA: Diagnosis not present

## 2020-09-11 DIAGNOSIS — E861 Hypovolemia: Secondary | ICD-10-CM | POA: Diagnosis present

## 2020-09-11 DIAGNOSIS — R627 Adult failure to thrive: Principal | ICD-10-CM | POA: Diagnosis present

## 2020-09-11 DIAGNOSIS — Z515 Encounter for palliative care: Secondary | ICD-10-CM | POA: Diagnosis not present

## 2020-09-11 DIAGNOSIS — J9811 Atelectasis: Secondary | ICD-10-CM | POA: Diagnosis not present

## 2020-09-11 DIAGNOSIS — Z880 Allergy status to penicillin: Secondary | ICD-10-CM | POA: Diagnosis not present

## 2020-09-11 DIAGNOSIS — E039 Hypothyroidism, unspecified: Secondary | ICD-10-CM | POA: Diagnosis not present

## 2020-09-11 DIAGNOSIS — Z8349 Family history of other endocrine, nutritional and metabolic diseases: Secondary | ICD-10-CM

## 2020-09-11 DIAGNOSIS — Z7189 Other specified counseling: Secondary | ICD-10-CM

## 2020-09-11 DIAGNOSIS — F32A Depression, unspecified: Secondary | ICD-10-CM | POA: Diagnosis not present

## 2020-09-11 DIAGNOSIS — Z833 Family history of diabetes mellitus: Secondary | ICD-10-CM

## 2020-09-11 DIAGNOSIS — Z885 Allergy status to narcotic agent status: Secondary | ICD-10-CM | POA: Diagnosis not present

## 2020-09-11 DIAGNOSIS — I959 Hypotension, unspecified: Secondary | ICD-10-CM | POA: Diagnosis present

## 2020-09-11 DIAGNOSIS — Z79899 Other long term (current) drug therapy: Secondary | ICD-10-CM

## 2020-09-11 DIAGNOSIS — Z20822 Contact with and (suspected) exposure to covid-19: Secondary | ICD-10-CM | POA: Diagnosis present

## 2020-09-11 DIAGNOSIS — R630 Anorexia: Secondary | ICD-10-CM | POA: Diagnosis not present

## 2020-09-11 DIAGNOSIS — Z841 Family history of disorders of kidney and ureter: Secondary | ICD-10-CM

## 2020-09-11 DIAGNOSIS — E785 Hyperlipidemia, unspecified: Secondary | ICD-10-CM | POA: Diagnosis present

## 2020-09-11 DIAGNOSIS — R1310 Dysphagia, unspecified: Secondary | ICD-10-CM

## 2020-09-11 DIAGNOSIS — Z8249 Family history of ischemic heart disease and other diseases of the circulatory system: Secondary | ICD-10-CM

## 2020-09-11 DIAGNOSIS — K222 Esophageal obstruction: Secondary | ICD-10-CM | POA: Diagnosis not present

## 2020-09-11 DIAGNOSIS — D649 Anemia, unspecified: Secondary | ICD-10-CM | POA: Diagnosis not present

## 2020-09-11 DIAGNOSIS — J309 Allergic rhinitis, unspecified: Secondary | ICD-10-CM | POA: Diagnosis present

## 2020-09-11 DIAGNOSIS — R1319 Other dysphagia: Secondary | ICD-10-CM | POA: Diagnosis not present

## 2020-09-11 DIAGNOSIS — Z818 Family history of other mental and behavioral disorders: Secondary | ICD-10-CM

## 2020-09-11 DIAGNOSIS — R634 Abnormal weight loss: Secondary | ICD-10-CM | POA: Diagnosis not present

## 2020-09-11 LAB — RESPIRATORY PANEL BY RT PCR (FLU A&B, COVID)
Influenza A by PCR: NEGATIVE
Influenza B by PCR: NEGATIVE
SARS Coronavirus 2 by RT PCR: NEGATIVE

## 2020-09-11 LAB — URINALYSIS, COMPLETE (UACMP) WITH MICROSCOPIC
Bacteria, UA: NONE SEEN
Bilirubin Urine: NEGATIVE
Glucose, UA: NEGATIVE mg/dL
Hgb urine dipstick: NEGATIVE
Ketones, ur: NEGATIVE mg/dL
Nitrite: NEGATIVE
Protein, ur: NEGATIVE mg/dL
Specific Gravity, Urine: 1.016 (ref 1.005–1.030)
pH: 8 (ref 5.0–8.0)

## 2020-09-11 LAB — CBC WITH DIFFERENTIAL/PLATELET
Abs Immature Granulocytes: 0 10*3/uL (ref 0.00–0.07)
Basophils Absolute: 0 10*3/uL (ref 0.0–0.1)
Basophils Relative: 1 %
Eosinophils Absolute: 0 10*3/uL (ref 0.0–0.5)
Eosinophils Relative: 0 %
HCT: 30 % — ABNORMAL LOW (ref 36.0–46.0)
Hemoglobin: 10.2 g/dL — ABNORMAL LOW (ref 12.0–15.0)
Immature Granulocytes: 0 %
Lymphocytes Relative: 43 %
Lymphs Abs: 1.1 10*3/uL (ref 0.7–4.0)
MCH: 31.4 pg (ref 26.0–34.0)
MCHC: 34 g/dL (ref 30.0–36.0)
MCV: 92.3 fL (ref 80.0–100.0)
Monocytes Absolute: 0.3 10*3/uL (ref 0.1–1.0)
Monocytes Relative: 12 %
Neutro Abs: 1.1 10*3/uL — ABNORMAL LOW (ref 1.7–7.7)
Neutrophils Relative %: 44 %
Platelets: 150 10*3/uL (ref 150–400)
RBC: 3.25 MIL/uL — ABNORMAL LOW (ref 3.87–5.11)
RDW: 14.3 % (ref 11.5–15.5)
WBC: 2.6 10*3/uL — ABNORMAL LOW (ref 4.0–10.5)
nRBC: 0 % (ref 0.0–0.2)

## 2020-09-11 LAB — COMPREHENSIVE METABOLIC PANEL
ALT: 31 U/L (ref 0–44)
AST: 41 U/L (ref 15–41)
Albumin: 3.8 g/dL (ref 3.5–5.0)
Alkaline Phosphatase: 69 U/L (ref 38–126)
Anion gap: 7 (ref 5–15)
BUN: 11 mg/dL (ref 8–23)
CO2: 33 mmol/L — ABNORMAL HIGH (ref 22–32)
Calcium: 8.7 mg/dL — ABNORMAL LOW (ref 8.9–10.3)
Chloride: 88 mmol/L — ABNORMAL LOW (ref 98–111)
Creatinine, Ser: 0.57 mg/dL (ref 0.44–1.00)
GFR, Estimated: >60 mL/min (ref >60)
Glucose, Bld: 79 mg/dL (ref 70–99)
Potassium: 3.9 mmol/L (ref 3.5–5.1)
Sodium: 128 mmol/L — ABNORMAL LOW (ref 135–145)
Total Bilirubin: 0.7 mg/dL (ref 0.3–1.2)
Total Protein: 7.3 g/dL (ref 6.5–8.1)

## 2020-09-11 LAB — LIPASE, BLOOD: Lipase: 31 U/L (ref 11–51)

## 2020-09-11 LAB — PHOSPHORUS: Phosphorus: 3.3 mg/dL (ref 2.5–4.6)

## 2020-09-11 LAB — MAGNESIUM: Magnesium: 2.3 mg/dL (ref 1.7–2.4)

## 2020-09-11 LAB — TSH: TSH: 17.499 u[IU]/mL — ABNORMAL HIGH (ref 0.350–4.500)

## 2020-09-11 MED ORDER — CLONAZEPAM 0.125 MG PO TBDP
0.1250 mg | ORAL_TABLET | Freq: Every day | ORAL | Status: DC
  Administered 2020-09-12 – 2020-09-19 (×6): 0.125 mg via ORAL
  Filled 2020-09-11 (×8): qty 1

## 2020-09-11 MED ORDER — SODIUM CHLORIDE 0.9 % IV SOLN: Freq: Once | INTRAVENOUS | Status: AC

## 2020-09-11 MED ORDER — MIRTAZAPINE 15 MG PO TABS
15.0000 mg | ORAL_TABLET | Freq: Every day | ORAL | Status: DC
  Administered 2020-09-13 – 2020-09-18 (×2): 15 mg via ORAL
  Filled 2020-09-11 (×4): qty 1

## 2020-09-11 MED ORDER — HEPARIN SODIUM (PORCINE) 5000 UNIT/ML IJ SOLN
5000.0000 [IU] | Freq: Three times a day (TID) | INTRAMUSCULAR | Status: DC
  Filled 2020-09-11 (×5): qty 1

## 2020-09-11 MED ORDER — LEVOTHYROXINE SODIUM 88 MCG PO TABS
88.0000 ug | ORAL_TABLET | Freq: Every day | ORAL | Status: DC
  Administered 2020-09-13 – 2020-09-19 (×7): 88 ug via ORAL
  Filled 2020-09-11 (×9): qty 1

## 2020-09-11 MED ORDER — IOHEXOL 300 MG/ML  SOLN
60.0000 mL | Freq: Once | INTRAMUSCULAR | Status: AC | PRN
  Administered 2020-09-11: 22:00:00 60 mL via INTRAVENOUS

## 2020-09-11 MED ORDER — PANTOPRAZOLE SODIUM 40 MG IV SOLR
40.0000 mg | Freq: Two times a day (BID) | INTRAVENOUS | Status: DC
  Administered 2020-09-11 – 2020-09-12 (×2): 40 mg via INTRAVENOUS
  Filled 2020-09-11 (×2): qty 40

## 2020-09-11 NOTE — ED Notes (Signed)
Assisted patient to toilet without difficulty

## 2020-09-11 NOTE — ED Notes (Signed)
Admit MD at bedside

## 2020-09-11 NOTE — Assessment & Plan Note (Signed)
Blood pressure 105/72, pulse 71, temperature 97.9 F (36.6 C), temperature source Oral, resp. rate 18, height 5\' 1"  (1.549 m), weight 38.6 kg, SpO2 100 %. bp is stable.

## 2020-09-11 NOTE — ED Provider Notes (Signed)
[facility] Emergency Department Provider Note ____________________________________________   First MD Initiated Contact with Patient 09/11/20 1119     (approximate)  I have reviewed the triage vital signs and the nursing notes.   HISTORY  Chief Complaint Abnormal Lab    HPI Maureen [name] is a 74 y.o. female with PMH as noted below, most notably including chronic weight loss due to likely avoidant restrictive food intake disorder and dysphagia, who presents for evaluation after recent abnormal labs.  The son states that the patient had labs drawn last week at her PMDs office, and they were called 4 days ago stating that the labs were abnormal including a low WBC count and that they should come to the hospital for evaluation.  He states that the patient has become weaker throughout the weekend and has continued to barely have taken any p.o. other than Ensure.  The patient herself states that she feels weak, but denies any focal complaints.   Past Medical History:  Diagnosis Date  . Allergy   . Corn of toe   . Depression   . First degree AV block   . GERD (gastroesophageal reflux disease)   . Hammer toe of right foot   . Hyperglycemia   . Hyperlipidemia   . Hypertension   . Loss of appetite   . Prolapse of urethra   . Scoliosis (and kyphoscoliosis), idiopathic   . Thyroid disease   . Tinnitus of both ears   . Vitreous floaters of right eye     Patient Active Problem List   Diagnosis Date Noted  . Failure to thrive in adult 09/11/2020  . Avoidant-restrictive food intake disorder (ARFID) 08/30/2020  . Paroxysmal atrial fibrillation (HCC) 03/22/2020  . Vitamin D deficiency 03/01/2020  . Esophageal dysphagia   . Arrhythmia 08/25/2019  . Rectal bleeding   . Low grade squamous intraepith lesion on cytologic smear cervix (lgsil) 08/04/2018  . Chronic venous insufficiency 07/05/2018  . Lymphedema 07/05/2018  . Hypertension 04/02/2018  . Swelling  of limb 04/02/2018  . Depression, major, recurrent, mild (HCC) 12/29/2017  . Leukopenia 05/24/2017  . Allergic rhinitis, seasonal 06/29/2015  . Clavus 06/29/2015  . 1st degree AV block 06/29/2015  . Hammer toe 06/29/2015  . H/O: HTN (hypertension) 06/29/2015  . Blood glucose elevated 06/29/2015  . Floater, vitreous 06/29/2015  . Bilateral tinnitus 06/29/2015  . Hypothyroidism (acquired) 04/02/2015  . Gastroesophageal reflux disease 04/02/2015  . Hammer toe of right foot 04/02/2015  . Scoliosis of thoracic spine 04/02/2015  . Urethral prolapse 04/02/2015  . Corn of toe 04/02/2015  . Hyperlipidemia 04/02/2015  . Varicose veins of both lower extremities 04/02/2015    Past Surgical History:  Procedure Laterality Date  . ABDOMINAL HYSTERECTOMY  1989  . APPENDECTOMY  1989  . BREAST EXCISIONAL BIOPSY Bilateral    multiple biopsies negative  . BREAST SURGERY     Several  . COLONOSCOPY WITH PROPOFOL N/A 07/28/2019   Procedure: COLONOSCOPY WITH PROPOFOL;  Surgeon: Toney ReilVanga, Rohini Reddy, MD;  [facility];  Service: Gastroenterology;  Laterality: N/A;  . ESOPHAGOGASTRODUODENOSCOPY (EGD) WITH PROPOFOL N/A 11/11/2019   Procedure: ESOPHAGOGASTRODUODENOSCOPY (EGD) WITH PROPOFOL;  Surgeon: Toney ReilVanga, Rohini Reddy, MD;  [facility];  Service: Gastroenterology;  Laterality: N/A;  . TUMOR EXCISION  1989   same time as hysterectomy    Prior to Admission medications   Medication Sig Start Date End Date Taking? Authorizing Provider  clonazepam (KLONOPIN) 0.125 MG disintegrating tablet Take 1 tablet (  0.125 mg total) by mouth 2 (two) times daily. Patient taking differently: Take 0.125 mg by mouth daily.  04/27/20   Alba Cory, MD  Ensure (ENSURE) Take 237 mLs by mouth. Pt drinking approx 3 ensures per day    [provider]  Iron-Vitamins (GERITOL COMPLETE PO) Take 1 tablet by mouth daily. Patient not taking: Reported on 05/29/2020    [provider]   levothyroxine (SYNTHROID) 88 MCG tablet Take 1 tablet (88 mcg total) by mouth daily before breakfast. 05/01/20   Carlynn Purl, Danna Hefty, MD  mirtazapine (REMERON) 15 MG tablet 7.5 mg at night for two weeks, then 15 mg at night Patient not taking: Reported on 05/29/2020 02/02/20   Neysa Hotter, MD  omeprazole (PRILOSEC) 40 MG capsule Take 1 capsule (40 mg total) by mouth daily before breakfast. 11/01/19   Toney Reil, MD    Allergies Aspirin, Citalopram, Codeine, and Penicillins  Family History  Problem Relation Age of Onset  . Diabetes Father 84       DM complications  . Hypertension Mother 55       CKD  . Thyroid disease Mother   . Heart failure Mother   . Kidney disease Mother   . Depression Mother   . Breast cancer Sister 70  . Depression Sister   . Kidney disease Other   . Breast cancer Maternal Aunt   . Breast cancer Cousin   . Anuerysm Son 51  . Diabetes Son     Social History Social History   Tobacco Use  . Smoking status: Never Smoker  . Smokeless tobacco: Never Used  . Tobacco comment: smoking cessation materials not required  Vaping Use  . Vaping Use: Never used  Substance Use Topics  . Alcohol use: Never    Alcohol/week: 0.0 standard drinks  . Drug use: No    Review of Systems  Constitutional: No fever.  Positive for generalized weakness. Eyes: No redness. ENT: No sore throat. Cardiovascular: Denies chest pain. Respiratory: Denies shortness of breath. Gastrointestinal: No vomiting or diarrhea.  Genitourinary: Negative for dysuria.  Musculoskeletal: Negative for back pain. Skin: Negative for rash. Neurological: Negative for headache.   ____________________________________________   PHYSICAL EXAM:  VITAL SIGNS: ED Triage Vitals [09/11/20 1112]  Enc Vitals Group     BP      Pulse      Resp      Temp      Temp src      SpO2      Weight 85 lb (38.6 kg)     Height 5\' 1"  (1.549 m)     Head Circumference      Peak Flow      Pain Score 0      Pain Loc      Pain Edu?      Excl. in GC?     Constitutional: Alert and oriented.  Cachectic and chronically ill-appearing but in no acute distress. Eyes: Conjunctivae are normal.  Head: Atraumatic. Nose: No congestion/rhinnorhea. Mouth/Throat: Mucous membranes are moist.   Neck: Normal range of motion.  Cardiovascular: Normal rate, regular rhythm. Good peripheral circulation. Respiratory: Normal respiratory effort.  No retractions. Gastrointestinal: Soft and nontender. No distention.  Genitourinary: No flank tenderness. Musculoskeletal: No lower extremity edema.  Extremities warm and well perfused.  Neurologic:  Normal speech and language. No gross focal neurologic deficits are appreciated.  Skin:  Skin is warm and dry. No rash noted. Psychiatric: Mood and affect are normal. Speech and behavior are  normal.  ____________________________________________   LABS (all labs ordered are listed, but only abnormal results are displayed)  Labs Reviewed  COMPREHENSIVE METABOLIC PANEL - Abnormal; Notable for the following components:      Result Value   Sodium 128 (*)    Chloride 88 (*)    CO2 33 (*)    Calcium 8.7 (*)    All other components within normal limits  CBC WITH DIFFERENTIAL/PLATELET - Abnormal; Notable for the following components:   WBC 2.6 (*)    RBC 3.25 (*)    Hemoglobin 10.2 (*)    HCT 30.0 (*)    Neutro Abs 1.1 (*)    All other components within normal limits  URINALYSIS, COMPLETE (UACMP) WITH MICROSCOPIC - Abnormal; Notable for the following components:   Color, Urine YELLOW (*)    APPearance HAZY (*)    Leukocytes,Ua MODERATE (*)    All other components within normal limits  TSH - Abnormal; Notable for the following components:   TSH 17.499 (*)    All other components within normal limits  RESPIRATORY PANEL BY RT PCR (FLU A&B, COVID)  LIPASE, BLOOD  MAGNESIUM  PHOSPHORUS  VITAMIN D 25 HYDROXY (VIT D DEFICIENCY, FRACTURES)    ____________________________________________  EKG  ED ECG REPORT I, Dionne Bucy, the attending physician, personally viewed and interpreted this ECG.  Date: 09/11/2020 EKG Time: 1209 Rate: 72 Rhythm: normal sinus rhythm QRS Axis: normal Intervals: Prolonged PR ST/T Wave abnormalities: Nonspecific T wave abnormalities Narrative Interpretation: no evidence of acute ischemia  ____________________________________________  RADIOLOGY    ____________________________________________   PROCEDURES  Procedure(s) performed: No  Procedures  Critical Care performed: No ____________________________________________   INITIAL IMPRESSION / ASSESSMENT AND PLAN / ED COURSE  Pertinent labs & imaging results that were available during my care of the patient were reviewed by me and considered in my medical decision making (see chart for details).  73 year old female with PMH as noted above including chronic decreased p.o. intake and weight loss presents for evaluation of abnormal labs and worsening generalized weakness.  I reviewed the past medical records in Epic.  The patient last was seen by her PMD Dr. Carlynn Purl and had labs done on 11/4.  The son confirms that this is the most recent blood work that they were called about, although states that they were called only 4 days ago.  Work-up was significant for pancytopenia with a WBC count of 2.0, elevated TSH, and low vitamin D.  I do not see any notes referring to a telephone contact with the patient or specific referral to the hospital, so the timeline is somewhat unclear.  On exam, the patient's vital signs are normal.  She is chronically weak and cachectic appearing.  Mucous membranes are moist.  The abdomen is soft and nontender.  Physical exam is otherwise unremarkable for acute findings.  Overall presentation is consistent with chronic malnutrition likely due to the patient's underlying avoidant restrictive food intake disorder  which she has been evaluated and treated for long-term.  We will obtain repeat labs today to evaluate for acute findings that may require urgent intervention and possible admission.  If the work-up is reassuring I will contact the PMD for follow-up.  ----------------------------------------- 3:33 PM on 09/11/2020 -----------------------------------------  Lab work-up shows no significant changes from 11/4.  I discussed the case with the patient's PMD Dr. Carlynn Purl who advises that given her significant decline over the last several weeks, she recommended admission for hydration, meals under supervision, and possible nursing  home placement.  The patient and her son agree with this plan.  I discussed the case with the hospitalist Dr. Allena Katz.  ____________________________________________   FINAL CLINICAL IMPRESSION(S) / ED DIAGNOSES  Final diagnoses:  Weakness  Dysphagia, unspecified type  Hyponatremia      NEW MEDICATIONS STARTED DURING THIS VISIT:  New Prescriptions   No medications on file     Note:  This document was prepared using Dragon voice recognition software and may include unintentional dictation errors.    Dionne Bucy, MD 09/11/20 [phone]

## 2020-09-11 NOTE — Assessment & Plan Note (Signed)
Pt has had symptoms of dysphagia and resultant anorexia since past November per son at bedside.  She has seen multiple dentist and specialist and has had home health nurses for feeding and calorie counting.  After talking to pt it seems she is the barrier and I d/w patient and asked that she tell us honestly  About how she is feeling as we cannot force treatment  Of any kind and she is in critical health due to her malnutrition . To any questions or thought of plan for her weight and further plan to improve her with she says she will think about it. I d/w pt that what does she want , if she wants no treatment I would consult palliative care, but if she is ready to get better We should consider feeding tube,which is not permament. Pt is not telling me clearly she is ready for treatment or to get better.  Son is very frustrated as he has tried multiepl ways to help mom but is not getting thru to her. He asked to speak to mom alone.

## 2020-09-11 NOTE — Assessment & Plan Note (Signed)
Pt had been on Remeron and has not been taking it . She has run out of klonopin one week ago.

## 2020-09-11 NOTE — Assessment & Plan Note (Signed)
TSH is abnormal because pt is not taking her thyroid meds.

## 2020-09-11 NOTE — ED Triage Notes (Signed)
Pt son reports that pt had blood work done at her PMD and they called today and told her to come to the for observation due to abnormal labs. Pt denies pain or other symptoms.

## 2020-09-11 NOTE — Assessment & Plan Note (Signed)
I do not think this is related to any kind of dysphagia pt had egd in July and only eats small amount of baby food and ensure.  Iv ppi. Darnell Level.

## 2020-09-11 NOTE — H&P (Signed)
History and Physical    Maureen Floratricia A Parrilla BJY:[id number] DOB: 07-May-1946 DOA: 09/11/2020  PCP: Alba CorySowles, Krichna, MD    Patient coming from:  home   Chief Complaint:  Abnormal labs    HPI: Maureen [name] is a 74 y.o. female with medical history significant of  GERD, HTN, Seen in ed for abnormal labs, FTT,difficulty swallowing and getting nutrition.    ED Course:  Vitals:   09/11/20 1530 09/11/20 1730 09/11/20 1800 09/11/20 1930  BP: 110/69 103/74 113/75 105/72  Pulse: 76 66 65 71  Resp: 18 16  18   Temp:    97.9 F (36.6 C)  TempSrc:    Oral  SpO2: 100% 100% 100% 100%  Weight:      Height:          Review of Systems:  Review of Systems  Gastrointestinal:       Dysphagia.   All other systems reviewed and are negative.    Past Medical History:  Diagnosis Date  . Allergy   . Corn of toe   . Depression   . First degree AV block   . GERD (gastroesophageal reflux disease)   . Hammer toe of right foot   . Hyperglycemia   . Hyperlipidemia   . Hypertension   . Loss of appetite   . Prolapse of urethra   . Scoliosis (and kyphoscoliosis), idiopathic   . Thyroid disease   . Tinnitus of both ears   . Vitreous floaters of right eye     Past Surgical History:  Procedure Laterality Date  . ABDOMINAL HYSTERECTOMY  1989  . APPENDECTOMY  1989  . BREAST EXCISIONAL BIOPSY Bilateral    multiple biopsies negative  . BREAST SURGERY     Several  . COLONOSCOPY WITH PROPOFOL N/A 07/28/2019   Procedure: COLONOSCOPY WITH PROPOFOL;  Surgeon: Toney ReilVanga, Rohini Reddy, MD;  [facility];  Service: Gastroenterology;  Laterality: N/A;  . ESOPHAGOGASTRODUODENOSCOPY (EGD) WITH PROPOFOL N/A 11/11/2019   Procedure: ESOPHAGOGASTRODUODENOSCOPY (EGD) WITH PROPOFOL;  Surgeon: Toney ReilVanga, Rohini Reddy, MD;  [facility];  Service: Gastroenterology;  Laterality: N/A;  . TUMOR EXCISION  1989   same time as hysterectomy     reports that she has never smoked. She has never used  smokeless tobacco. She reports that she does not drink alcohol and does not use drugs.  Allergies  Allergen Reactions  . Aspirin     Tinnitus  . Citalopram Other (See Comments)  . Codeine Nausea And Vomiting  . Penicillins Rash    Has patient had a PCN reaction causing immediate rash, facial/tongue/throat swelling, SOB or lightheadedness with hypotension: Yes Has patient had a PCN reaction causing severe rash involving mucus membranes or skin necrosis: No Has patient had a PCN reaction that required hospitalization No Has patient had a PCN reaction occurring within the last 10 years: No If all of the above answers are "NO", then may proceed with Cephalosporin use.    Family History  Problem Relation Age of Onset  . Diabetes Father 6576       DM complications  . Hypertension Mother 279       CKD  . Thyroid disease Mother   . Heart failure Mother   . Kidney disease Mother   . Depression Mother   . Breast cancer Sister [phone]  . Depression Sister   . Kidney disease Other   . Breast cancer Maternal Aunt   . Breast cancer Cousin   . Anuerysm Son 51  .  Diabetes Son     Prior to Admission medications   Medication Sig Start Date End Date Taking? Authorizing Provider  clonazepam (KLONOPIN) 0.125 MG disintegrating tablet Take 1 tablet (0.125 mg total) by mouth 2 (two) times daily. Patient taking differently: Take 0.125 mg by mouth daily.  04/27/20   Alba Cory, MD  Ensure (ENSURE) Take 237 mLs by mouth. Pt drinking approx 3 ensures per day    [provider]  Iron-Vitamins (GERITOL COMPLETE PO) Take 1 tablet by mouth daily. Patient not taking: Reported on 05/29/2020    [provider]  levothyroxine (SYNTHROID) 88 MCG tablet Take 1 tablet (88 mcg total) by mouth daily before breakfast. 05/01/20   Carlynn Purl, Danna Hefty, MD  mirtazapine (REMERON) 15 MG tablet 7.5 mg at night for two weeks, then 15 mg at night Patient not taking: Reported on 05/29/2020 02/02/20   Neysa Hotter, MD   omeprazole (PRILOSEC) 40 MG capsule Take 1 capsule (40 mg total) by mouth daily before breakfast. 11/01/19   Toney Reil, MD    Physical Exam: Vitals:   09/11/20 1530 09/11/20 1730 09/11/20 1800 09/11/20 1930  BP: 110/69 103/74 113/75 105/72  Pulse: 76 66 65 71  Resp: 18 16  18   Temp:    97.9 F (36.6 C)  TempSrc:    Oral  SpO2: 100% 100% 100% 100%  Weight:      Height:        Physical Exam Vitals and nursing note reviewed.  Constitutional:      Appearance: She is cachectic.  HENT:     Head: Normocephalic and atraumatic.     Right Ear: Hearing and external ear normal.     Left Ear: Hearing and external ear normal.     Nose: Nose normal.     Mouth/Throat:     Lips: Pink.     Mouth: Mucous membranes are moist.     Dentition: Abnormal dentition.     Tongue: No lesions. Tongue does not deviate from midline.     Pharynx: Oropharynx is clear.  Eyes:     Extraocular Movements: Extraocular movements intact.  Cardiovascular:     Rate and Rhythm: Normal rate and regular rhythm.     Pulses: Normal pulses.     Heart sounds: Normal heart sounds.  Pulmonary:     Effort: Pulmonary effort is normal.     Breath sounds: Normal breath sounds.  Abdominal:     Palpations: Abdomen is soft.  Skin:    General: Skin is warm.  Neurological:     General: No focal deficit present.     Mental Status: She is alert and oriented to person, place, and time.  Psychiatric:        Mood and Affect: Mood normal.        Behavior: Behavior normal.      Labs on Admission: I have personally reviewed following labs and imaging studies  CBC: Recent Labs  Lab 09/11/20 1140  WBC 2.6*  NEUTROABS 1.1*  HGB 10.2*  HCT 30.0*  MCV 92.3  PLT 150   Basic Metabolic Panel: Recent Labs  Lab 09/11/20 1140  NA 128*  K 3.9  CL 88*  CO2 33*  GLUCOSE 79  BUN 11  CREATININE 0.57  CALCIUM 8.7*  MG 2.3  PHOS 3.3   GFR: Estimated Creatinine Clearance: 37.6 mL/min (by C-G formula based on  SCr of 0.57 mg/dL). Liver Function Tests: Recent Labs  Lab 09/11/20 1140  AST 41  ALT  31  ALKPHOS 69  BILITOT 0.7  PROT 7.3  ALBUMIN 3.8   Recent Labs  Lab 09/11/20 1140  LIPASE 31   No results for input(s): AMMONIA in the last 168 hours. Coagulation Profile: No results for input(s): INR, PROTIME in the last 168 hours. Cardiac Enzymes: No results for input(s): CKTOTAL, CKMB, CKMBINDEX, TROPONINI in the last 168 hours. BNP (last 3 results) No results for input(s): PROBNP in the last 8760 hours. HbA1C: No results for input(s): HGBA1C in the last 72 hours. CBG: No results for input(s): GLUCAP in the last 168 hours. Lipid Profile: No results for input(s): CHOL, HDL, LDLCALC, TRIG, CHOLHDL, LDLDIRECT in the last 72 hours. Thyroid Function Tests: Recent Labs    09/11/20 1140  TSH 17.499*   Anemia Panel: No results for input(s): VITAMINB12, FOLATE, FERRITIN, TIBC, IRON, RETICCTPCT in the last 72 hours. Urine analysis:    Component Value Date/Time   COLORURINE YELLOW (A) 09/11/2020 1145   APPEARANCEUR HAZY (A) 09/11/2020 1145   LABSPEC 1.016 09/11/2020 1145   PHURINE 8.0 09/11/2020 1145   GLUCOSEU NEGATIVE 09/11/2020 1145   HGBUR NEGATIVE 09/11/2020 1145   BILIRUBINUR NEGATIVE 09/11/2020 1145   KETONESUR NEGATIVE 09/11/2020 1145   PROTEINUR NEGATIVE 09/11/2020 1145   NITRITE NEGATIVE 09/11/2020 1145   LEUKOCYTESUR MODERATE (A) 09/11/2020 1145   No intake or output data in the 24 hours ending 09/11/20 1949 Lab Results  Component Value Date   CREATININE 0.57 09/11/2020   CREATININE 0.51 (L) 08/30/2020   CREATININE 0.66 01/25/2020    COVID-19 Labs  No results for input(s): DDIMER, FERRITIN, LDH, CRP in the last 72 hours.  Lab Results  Component Value Date   SARSCOV2NAA NEGATIVE 09/11/2020   SARSCOV2NAA NEGATIVE 11/08/2019   SARSCOV2NAA NEGATIVE 08/25/2019   SARSCOV2NAA NEGATIVE 07/25/2019    Radiological Exams on Admission: No results found.  EKG:  Independently reviewed.     Assessment/Plan Failure to thrive in adult Assessment & Plan Pt has had symptoms of dysphagia and resultant anorexia since past November per son at bedside.  She has seen multiple dentist and specialist and has had home health nurses for feeding and calorie counting.  After talking to pt it seems she is the barrier and I d/w patient and asked that she tell us honestly  About how she is feeling as we cannot force treatment  Of any kind and she is in critical health due to her malnutrition . To any questions or thought of plan for her weight and further plan to improve her with she says she will think about it. I d/w pt that what does she want , if she wants no treatment I would consult palliative care, but if she is ready to get better We should consider feeding tube,which is not permament. Pt is not telling me clearly she is ready for treatment or to get better.  Son is very frustrated as he has tried multiepl ways to help mom but is not getting thru to her. He asked to speak to mom alone.    Hypertension Assessment & Plan Blood pressure 105/72, pulse 71, temperature 97.9 F (36.6 C), temperature source Oral, resp. rate 18, height 5\' 1"  (1.549 m), weight 38.6 kg, SpO2 100 %. bp is stable.    Dysphagia Assessment & Plan I do not think this is related to any kind of dysphagia pt had egd in July and only eats small amount of baby food and ensure.  Iv ppi. August.    Hypothyroidism (  acquired) Assessment & Plan TSH is abnormal because pt is not taking her thyroid meds.    Avoidant-restrictive food intake disorder (ARFID) Assessment & Plan Pt had been on Remeron and has not been taking it . She has run out of klonopin one week ago.    DVT prophylaxis:  heparin   Code Status:  Full code   Family Communication:  None at bedside   Disposition Plan:  Home    Consults called:  None  Admission status:    Gertha Calkin MD [facility] [phone] How to contact the [facility] Attending or Consulting provider 7A - 7P or covering provider during after hours 7P -7A, for this patient?    1. Check the care team in [facility] and look for a) attending/consulting [facility] provider listed and b) the [facility] team listed 2. Log into [email or web address] and use [facility] universal password to access. If you do not have the password, please contact the hospital operator. 3. Locate the [facility] provider you are looking for under [facility] and page to a number that you can be directly reached. 4. If you still have difficulty reaching the provider, please page the [facility] (Director on Call) for the Hospitalists listed on amion for assistance. [email or web address] Password [email or web address] 09/11/2020, 7:49 PM

## 2020-09-11 NOTE — ED Notes (Signed)
Pt family reports loss of appetite x 1 year. Reports patient afraid to eat solid foods due to dysphagia issues, has been drinking ensures and eating baby food.

## 2020-09-12 ENCOUNTER — Encounter: Payer: Self-pay | Admitting: Internal Medicine

## 2020-09-12 ENCOUNTER — Telehealth: Payer: Medicare HMO | Admitting: *Deleted

## 2020-09-12 DIAGNOSIS — R627 Adult failure to thrive: Principal | ICD-10-CM

## 2020-09-12 DIAGNOSIS — F5082 Avoidant/restrictive food intake disorder: Secondary | ICD-10-CM

## 2020-09-12 DIAGNOSIS — R1319 Other dysphagia: Secondary | ICD-10-CM | POA: Diagnosis not present

## 2020-09-12 DIAGNOSIS — I10 Essential (primary) hypertension: Secondary | ICD-10-CM | POA: Diagnosis not present

## 2020-09-12 MED ORDER — ENSURE ENLIVE PO LIQD
237.0000 mL | Freq: Three times a day (TID) | ORAL | Status: DC
  Administered 2020-09-13 – 2020-09-19 (×18): 237 mL via ORAL

## 2020-09-12 MED ORDER — PANTOPRAZOLE SODIUM 40 MG PO PACK
40.0000 mg | PACK | Freq: Two times a day (BID) | ORAL | Status: DC
  Filled 2020-09-12 (×14): qty 20

## 2020-09-12 NOTE — Progress Notes (Signed)
Pt has refused all medications scheduled throughout shift. When I explain to her what the medication is and what it's for, and that some of these are her home medications she still refuses. Pt says she will choke on the medication. When I asked if I could crush it and put it in applesauce, she said she would still choke. Pt also cries everytime we discuss medications.  Pt also has not slept at all throughout the night, and has been restless. I offered her, her clonazepam so it would help her relax and ease her anxiety but she refused that as well.  I made NP on call aware of pt's refusal. Will continue to encourage her to be compliant with medications.

## 2020-09-12 NOTE — Evaluation (Addendum)
Clinical/Bedside Swallow Evaluation Patient Details  Name: Maureen [name] MRN: [id number] Date of Birth: Nov 18, 1945  Today's Date: 09/12/2020 Time: SLP Start Time (ACUTE ONLY): 1100 SLP Stop Time (ACUTE ONLY): 1150 SLP Time Calculation (min) (ACUTE ONLY): 50 min  Past Medical History:  Past Medical History:  Diagnosis Date  . Allergy   . Corn of toe   . Depression   . First degree AV block   . GERD (gastroesophageal reflux disease)   . Hammer toe of right foot   . Hyperglycemia   . Hyperlipidemia   . Hypertension   . Loss of appetite   . Prolapse of urethra   . Scoliosis (and kyphoscoliosis), idiopathic   . Thyroid disease   . Tinnitus of both ears   . Vitreous floaters of right eye    Past Surgical History:  Past Surgical History:  Procedure Laterality Date  . ABDOMINAL HYSTERECTOMY  1989  . APPENDECTOMY  1989  . BREAST EXCISIONAL BIOPSY Bilateral    multiple biopsies negative  . BREAST SURGERY     Several  . COLONOSCOPY WITH PROPOFOL N/A 07/28/2019   Procedure: COLONOSCOPY WITH PROPOFOL;  Surgeon: Toney Reil, MD;  Location: [facility] ENDOSCOPY;  Service: Gastroenterology;  Laterality: N/A;  . ESOPHAGOGASTRODUODENOSCOPY (EGD) WITH PROPOFOL N/A 11/11/2019   Procedure: ESOPHAGOGASTRODUODENOSCOPY (EGD) WITH PROPOFOL;  Surgeon: Toney Reil, MD;  Location: [facility] ENDOSCOPY;  Service: Gastroenterology;  Laterality: N/A;  . TUMOR EXCISION  1989   same time as hysterectomy   HPI:  Pt is a 74 y.o. female with PMH as noted below, most notably including chronic weight loss due to likely avoidant restrictive food intake disorder, FTT, and Esophageal phase dysphagia, scoliosis, who presents for evaluation after recent abnormal labs.  The son states that the patient had labs drawn last week at her PMDs office, and they were called 4 days ago stating that the labs were abnormal including a low WBC count and that they should come to the hospital for evaluation.  He states that  the patient has become weaker throughout the weekend and has continued to barely have taken any p.o. other than Ensure.  The patient herself states that she feels weak, but denies any focal complaints.  CT of Chest: "Patulous esophagus demonstrating an air-fluid level. This may reflect changes of gastroesophageal reflux or esophageal dysmotility; Complete collapse of the right lower lobe with occlusion of the a right lower lobar pulmonary bronchus suggesting and endobronchial mass such as aspirated debris or mucous plug" -- suspect the Pulmonary status could be an impact from the Backflow of the Esophageal Dysmotility.  Pt had a EGD in 10/2019, and per DG Esophagus in 10/2019: "Substantial retention of contrast within the esophagus with long segment narrowing of the distal esophagus and gastroesophageal junction, and very delayed and limited relaxation of the gastroesophageal sphincter".    Assessment / Plan / Recommendation Clinical Impression  Pt seen today at bedside; Son leaving as evaluation was initiated. Pt was awake, alert resting in bed. She verbally engaged w/ SLP and others in room. Appeared min weak, cachectic in bed. Low volume of speech. Noted constant, chronic spitting/expectorating of phlegm at rest PRIOR to po's -- suspect related to habit and Esophageal mucous/phlegm/reflux wetness. Pt appears to present w/ adequate oropharyngeal phase swallowing function w/ No overt oropharyngeal phase dysphagia appreciated w/ few trials accepted; No neuromuscular swallowing deficits appreciated. Pt is at Reduced risk for aspiration from an oropharyngeal phase standpoint following general aspiration precautions. However, pt has  a BASELINE of CHRONIC Esophageal Phase Dysmotility dx'd in 10/2019. Current CT of Chest: "Patulous esophagus demonstrating an air-fluid level. This may reflect changes of gastroesophageal reflux or esophageal dysmotility; Complete collapse of the right lower lobe with occlusion of the a  right lower lobar pulmonary bronchus suggesting and endobronchial mass such as aspirated debris or mucous plug" -- suspect the Pulmonary status could be an impact from the Backflow of the Esophageal Dysmotility. Pt had a EGD in 10/2019, and per DG Esophagus in 10/2019: "Substantial retention of contrast within the esophagus with long segment narrowing of the distal esophagus and gastroesophageal junction, and very delayed and limited relaxation of the gastroesophageal sphincter".  ANY Dysmotility or Regurgitation of Reflux material can increase risk for aspiration of the Reflux material during Retrograde flow thus impact Voicing and Pulmonary status. Pt described ongoing issues w/ "pressure in my chest" pointing to mid-lower sternum area then described the discomfort has moved superiorly pointing to his Sternal Notch area. She stated she is unable to take full meals; only eats small amounts of a little soup, and thin liquids of Ensure. Family reported trying "many things" w/ pt to "get her to eat" at home. Pt consumed few trials of thin liquids via straw (preferred room temp water), then tsps of puree w/ no immediate, overt clinical s/s of aspiration noted; clear vocal quality b/t trials, no decline in pulmonary status, no multiple swallows noted post initial pharyngeal swallow. Oral phase appeared [facility] for bolus management and timely A-P transfer/clearing of material. OM exam was [facility] for oral clearing; lingual/labial movements. No unilateral weakness. Speech clear.  Of Note, pt demonstrated fairly quick s/s of Esophageal irritation -- felt a globus feeling, felt the need to hock/spit small amounts of mucous/phelgm (this same hock/spit of mucous was noted PRIOR to any oral intake - suspect mucous/irritation from Esophagus). Noted many tissues in bed/bowl. Suspect this is the reason for pt's reduced desire for oral intake -- noted this has been a long-standing presentation per chart notes.  Pt appears at risk for  aspiration of REFLUX/Phlegm Regurgitation material from the Esophagus. This appears to be a CHRONIC situation and one that is impacting both pulmonary status and nutrition status. Recommend continue a Full Liquid diet (few added purees as able) w/ thin liquids via Cup/less straw use d/t air swallowing; general aspiration precautions. STRICT REFLUX/GERD PRECAUTIONS. Rest Breaks during meals/oral intake to allow for Esophageal clearing. Recommend pt continue f/u w/ GI for management of Esophageal dysmotility and tx as indicated. Possible f/u w/ Palliative Care for GOC, Dietician. Recommend Pills in liquid or dissolvable forms for ease. Handouts given. MD to reconsult ST services if any New needs while admitted.  SLP Visit Diagnosis: Dysphagia, pharyngoesophageal phase (R13.14) (Esophageal phase dysphagia)    Aspiration Risk  Mild aspiration risk;Risk for inadequate nutrition/hydration from Esophageal phase Deficits   Diet Recommendation  Full liquid diet w/ few purees as able(loose); Thin liquids. General aspiration precautions. STRICT GERD/REFLUX PRECAUTIONS.  Medication Administration: Crushed with puree (or in liquid or dissolvable form)    Other  Recommendations Recommended Consults: Consider GI evaluation;Consider esophageal assessment (Dietician f/u; Palliative Care f/u for GOC) Oral Care Recommendations: Oral care BID;Oral care before and after PO;Staff/trained caregiver to provide oral care Other Recommendations:  (n/a)   Follow up Recommendations None      Frequency and Duration  (n/a)   (n/a)       Prognosis Prognosis for Safe Diet Advancement: Guarded (-Fair) Barriers to Reach Goals: Time post onset;Severity  of deficits;Behavior Barriers/Prognosis Comment: chronic Esophageal phase deficits      Swallow Study   General Date of Onset: 09/11/20 HPI: Pt is a 74 y.o. female with PMH as noted below, most notably including chronic weight loss due to likely avoidant restrictive food  intake disorder, FTT, and Esophageal phase dysphagia, scoliosis, who presents for evaluation after recent abnormal labs.  The son states that the patient had labs drawn last week at her PMDs office, and they were called 4 days ago stating that the labs were abnormal including a low WBC count and that they should come to the hospital for evaluation.  He states that the patient has become weaker throughout the weekend and has continued to barely have taken any p.o. other than Ensure.  The patient herself states that she feels weak, but denies any focal complaints.  CT of Chest: "Patulous esophagus demonstrating an air-fluid level. This may reflect changes of gastroesophageal reflux or esophageal dysmotility; Complete collapse of the right lower lobe with occlusion of the a right lower lobar pulmonary bronchus suggesting and endobronchial mass such as aspirated debris or mucous plug" -- suspect the Pulmonary status could be an impact from the Backflow of the Esophageal Dysmotility.  Pt had a EGD in 10/2019, and per DG Esophagus in 10/2019: "Substantial retention of contrast within the esophagus with long segment narrowing of the distal esophagus and gastroesophageal junction, and very delayed and limited relaxation of the gastroesophageal sphincter".  Type of Study: Bedside Swallow Evaluation Previous Swallow Assessment: none Diet Prior to this Study: Thin liquids (Full Liquid diet) Temperature Spikes Noted: No (wbc 2.6) Respiratory Status: Room air History of Recent Intubation: No Behavior/Cognition: Alert;Cooperative;Pleasant mood (min vebal cues) Oral Cavity Assessment: Within Functional Limits Oral Care Completed by SLP: Recent completion by staff Oral Cavity - Dentition: Missing dentition Vision: Functional for self-feeding Self-Feeding Abilities: Able to feed self;Needs set up Patient Positioning: Upright in bed (needed positioning) Baseline Vocal Quality: Normal;Low vocal intensity Volitional Cough:   (Fair) Volitional Swallow: Able to elicit    Oral/Motor/Sensory Function Overall Oral Motor/Sensory Function: Within functional limits   Ice Chips Ice chips: Within functional limits Presentation: Spoon (x1 accepted)   Thin Liquid Thin Liquid: Within functional limits Presentation: Straw;Self Fed (6 trials accepted)    Nectar Thick Nectar Thick Liquid: Not tested   Honey Thick Honey Thick Liquid: Not tested   Puree Puree: Within functional limits Presentation: Self Fed;Spoon (8 trials)   Solid     Solid: Not tested        Jerilynn Som, MS, CCC-SLP Speech Language Pathologist Rehab Services [phone] Fuad Forget 09/12/2020,5:25 PM

## 2020-09-12 NOTE — Hospital Course (Signed)
Maureen [name] is a 74 y.o. female with pertinent past medical history of chronic weight loss due to dysphagia from esophageal dysmotility and likely avoidant restrictive food intake disorder who presented to the ED on 09/11/20 on referral from primary care due to abnormal labs drawn 4 days prior.  Patient denied any specific complaints at time of admission other than generalized weakness.  Her son reported that patient has been progressively weak and had minimal PO intake aside from Ensure.

## 2020-09-12 NOTE — Progress Notes (Addendum)
Initial Nutrition Assessment  DOCUMENTATION CODES:   Severe malnutrition in context of chronic illness, Underweight  INTERVENTION:  Provide Ensure Enlive po TID, each supplement provides 350 kcal and 20 grams of protein. Patient prefers vanilla or strawberry.  Provide Magic cup BID with lunch and dinner, each supplement provides 290 kcal and 9 grams of protein. Patient prefers vanilla.  Encouraged adequate intake of calories and protein at meals.  Plan is for initiation of 48 hour calorie count to assess if PEG tube is needed. Palliative medicine was also consulted to discuss goals of care with patient.  Monitor magnesium, potassium, and phosphorus daily for at least 3 days, MD to replete as needed, as pt is at risk for refeeding syndrome given severe malnutrition.  NUTRITION DIAGNOSIS:   Severe Malnutrition related to chronic illness (dysphagia, anorexia) as evidenced by per patient/family report, severe fat depletion, severe muscle depletion, 34% weight loss over 1 year.  GOAL:   Patient will meet greater than or equal to 90% of their needs  MONITOR:   PO intake, Supplement acceptance, Labs, Weight trends, I & O's  REASON FOR ASSESSMENT:   Malnutrition Screening Tool, Consult Assessment of nutrition requirement/status, Diet education, Calorie Count  ASSESSMENT:   74 year old female with PMHx of GERD, HLD, HTN, depression admitted with FTT, symptoms of dysphagia and anorexia.   Met with patient at bedside. Patient initially was refusing assessment stating she had already seen two dietitians today. After discussing with patient I was the only RD working that floor and explaining reason for assessment she agreed to talk to RD, but provided limited information. She does endorse difficulty swallowing and anorexia. She is unable to provide further details as she reports she is tired of talking about it. Patient reports at home she typically eats snacks throughout the day. She  reports she drinks Ensure and will also have applesauce, soup, and ice cream. Patient reports at lunch she ate 100% of her tray. She reports she had one bottle of Ensure Enlive, applesauce, and tomato soup (approximately 509 kcal and 22 grams of protein). Patient reports she could drink 3 bottles of Ensure daily and prefers vanilla or strawberry. She is also willing to eat Magic Cup and prefers vanilla flavor. Brought menu to patient and reviewed options available on liquid diet.   Patient reports her UBW was around 125 lbs and she last weighed this a while ago. Per review of chart patient was 127.9 lbs on 09/09/2019. She is now 38.6 kg (85 lbs). She has lost 42.9 lbs (34% body weight) over the past year, which is significant for time frame.  Medications reviewed and include: levothyroxine, Remeron 15 mg QHS, Protonix.  Labs reviewed: Sodium 128, Chloride 88, CO2 33.  Discussed with RN. Patient was assessed by SLP and plan is to stay on full liquid diet for now.  Discussed with MD via secure chat. Plan is for initiation of calorie count to assess if PEG tube is needed.   NUTRITION - FOCUSED PHYSICAL EXAM:    Most Recent Value  Orbital Region Severe depletion  Upper Arm Region Severe depletion  Thoracic and Lumbar Region Severe depletion  Buccal Region Severe depletion  Temple Region Severe depletion  Clavicle Bone Region Severe depletion  Clavicle and Acromion Bone Region Severe depletion  Scapular Bone Region Severe depletion  Dorsal Hand Severe depletion  Patellar Region Severe depletion  Anterior Thigh Region Severe depletion  Posterior Calf Region Severe depletion  Edema (RD Assessment) None  Hair  Reviewed  Eyes Reviewed  Mouth Reviewed  Skin Reviewed  Nails Reviewed     Diet Order:   Diet Order            Diet full liquid Room service appropriate? Yes; Fluid consistency: Thin  Diet effective now                EDUCATION NEEDS:   No education needs have been  identified at this time  Skin:  Skin Assessment: Reviewed RN Assessment  Last BM:  Unknown/PTA  Height:   Ht Readings from Last 1 Encounters:  09/11/20 5' 1"  (1.549 m)   Weight:   Wt Readings from Last 1 Encounters:  09/11/20 38.6 kg   Ideal Body Weight:  47.8 kg  BMI:  Body mass index is 16.06 kg/m.  Estimated Nutritional Needs:   Kcal:  1400-1600  Protein:  70-80 grams  Fluid:  1.2-1.5 L/day  Jacklynn Barnacle, MS, RD, LDN Pager number available on Amion

## 2020-09-12 NOTE — Progress Notes (Signed)
PHARMACIST - PHYSICIAN COMMUNICATION  DR:   Denton Lank  CONCERNING: IV to Oral Route Change Policy  RECOMMENDATION: This patient is receiving pantoprazole by the intravenous route.  Based on criteria approved by the Pharmacy and Therapeutics Committee, the intravenous medication(s) is/are being converted to the equivalent oral dose form(s).  Discussed with nursing and suspension form preferred for this pt at this time.   DESCRIPTION: These criteria include:  The patient is eating (either orally or via tube) and/or has been taking other orally administered medications for a least 24 hours  The patient has no evidence of active gastrointestinal bleeding or impaired GI absorption (gastrectomy, short bowel, patient on TNA or NPO).  If you have questions about this conversion, please contact the Pharmacy Department  []   [phone] )  ( [phone] [x]   [phone] )  [facility] []   [phone] )  [facility] []   [phone] )  [facility] []   [phone] )  [facility]   ( [phone], PharmD, BCPS Clinical Pharmacist 09/12/2020 2:42 PM

## 2020-09-12 NOTE — Consult Note (Signed)
[place]  [phone] [phone]  ID: HARBOUR NORDMEYER OB: October 15, 1946  MR#: [id number]  VEL#:[id number]  Patient Care Team: Steele Sizer, MD as PCP - General (Family Medicine) Anell Barr, OD as Consulting Physician (Optometry) Dew, Erskine Squibb, MD as Consulting Physician (Vascular Surgery) Lloyd Huger, MD as Consulting Physician (Oncology) Neldon Labella, RN as Case Manager Gae Dry, MD as Referring Physician (Obstetrics and Gynecology) Norman Clay, MD as Consulting Physician (Psychiatry) Vern Claude, LCSW as Social Worker  CHIEF COMPLAINT: Anemia, leukopenia.  INTERVAL HISTORY: Patient is a 74 year old female who was last evaluated in clinic approximately 2 months ago who was recently admitted to the hospital with continued weight loss and failure to thrive.  She feels weak and fatigued.  She states she has a poor appetite, but this has improved.  She has no neurologic complaints.  She denies any recent fevers or illnesses.  She has no chest pain, shortness of breath, cough, or hemoptysis.  She denies any nausea, vomiting, constipation, diarrhea.  She has no melena or hematochezia.  She has no urinary complaints.  Patient offers no further specific complaints today.  REVIEW OF SYSTEMS:   Review of Systems  Constitutional: Positive for malaise/fatigue and weight loss. Negative for fever.  Respiratory: Negative.  Negative for cough, hemoptysis and shortness of breath.   Cardiovascular: Negative.  Negative for chest pain and leg swelling.  Gastrointestinal: Negative.  Negative for abdominal pain.  Genitourinary: Negative.  Negative for dysuria and hematuria.  Musculoskeletal: Negative.  Negative for back pain.  Skin: Negative.  Negative for rash.  Neurological: Positive for weakness. Negative for dizziness, focal weakness and headaches.  Psychiatric/Behavioral: Negative.  The patient is not nervous/anxious.     As per  HPI. Otherwise, a complete review of systems is negative.  PAST MEDICAL HISTORY: Past Medical History:  Diagnosis Date  . Allergy   . Corn of toe   . Depression   . First degree AV block   . GERD (gastroesophageal reflux disease)   . Hammer toe of right foot   . Hyperglycemia   . Hyperlipidemia   . Hypertension   . Loss of appetite   . Prolapse of urethra   . Scoliosis (and kyphoscoliosis), idiopathic   . Thyroid disease   . Tinnitus of both ears   . Vitreous floaters of right eye     PAST SURGICAL HISTORY: Past Surgical History:  Procedure Laterality Date  . ABDOMINAL HYSTERECTOMY  1989  . APPENDECTOMY  1989  . BREAST EXCISIONAL BIOPSY Bilateral    multiple biopsies negative  . BREAST SURGERY     Several  . COLONOSCOPY WITH PROPOFOL N/A 07/28/2019   Procedure: COLONOSCOPY WITH PROPOFOL;  Surgeon: Lin Landsman, MD;  Location: [facility] ENDOSCOPY;  Service: Gastroenterology;  Laterality: N/A;  . ESOPHAGOGASTRODUODENOSCOPY (EGD) WITH PROPOFOL N/A 11/11/2019   Procedure: ESOPHAGOGASTRODUODENOSCOPY (EGD) WITH PROPOFOL;  Surgeon: Lin Landsman, MD;  Location: [facility] ENDOSCOPY;  Service: Gastroenterology;  Laterality: N/A;  . TUMOR EXCISION  1989   same time as hysterectomy    FAMILY HISTORY: Family History  Problem Relation Age of Onset  . Diabetes Father 38       DM complications  . Hypertension Mother 61       CKD  . Thyroid disease Mother   . Heart failure Mother   . Kidney disease Mother   . Depression Mother   . Breast cancer Sister 40  . Depression Sister   .  Kidney disease Other   . Breast cancer Maternal Aunt   . Breast cancer Cousin   . Anuerysm Son 25  . Diabetes Son     ADVANCED DIRECTIVES (Y/N):  @ADVDIR @  HEALTH MAINTENANCE: Social History   Tobacco Use  . Smoking status: Never Smoker  . Smokeless tobacco: Never Used  . Tobacco comment: smoking cessation materials not required  Vaping Use  . Vaping Use: Never used  Substance Use Topics   . Alcohol use: Never    Alcohol/week: 0.0 standard drinks  . Drug use: No     Colonoscopy:  PAP:  Bone density:  Lipid panel:  Allergies  Allergen Reactions  . Aspirin     Tinnitus  . Citalopram Other (See Comments)  . Codeine Nausea And Vomiting  . Penicillins Rash    Has patient had a PCN reaction causing immediate rash, facial/tongue/throat swelling, SOB or lightheadedness with hypotension: Yes Has patient had a PCN reaction causing severe rash involving mucus membranes or skin necrosis: No Has patient had a PCN reaction that required hospitalization No Has patient had a PCN reaction occurring within the last 10 years: No If all of the above answers are "NO", then may proceed with Cephalosporin use.    Current Facility-Administered Medications  Medication Dose Route Frequency Provider Last Rate Last Admin  . 0.9 %  sodium chloride infusion   Intravenous Once Lang Snow, NP 100 mL/hr at 09/11/20 2320 Restarted at 09/11/20 2320  . clonazepam (KLONOPIN) disintegrating tablet 0.125 mg  0.125 mg Oral Daily Lang Snow, NP   0.125 mg at 09/12/20 1134  . feeding supplement (ENSURE ENLIVE / ENSURE PLUS) liquid 237 mL  237 mL Oral TID BM Nicole Kindred A, DO      . heparin injection 5,000 Units  5,000 Units Subcutaneous Q8H Para Skeans, MD      . levothyroxine (SYNTHROID) tablet 88 mcg  88 mcg Oral QAC breakfast Para Skeans, MD      . mirtazapine (REMERON) tablet 15 mg  15 mg Oral QHS Florina Ou V, MD      . pantoprazole sodium (PROTONIX) 40 mg/20 mL oral suspension 40 mg  40 mg Oral BID Lu Duffel, [place]        OBJECTIVE: Vitals:   09/12/20 0448 09/12/20 0700  BP: (!) 82/60 98/67  Pulse: 67 70  Resp: 20 14  Temp: 98.2 F (36.8 C) 98.3 F (36.8 C)  SpO2: 98% 100%     Body mass index is 16.06 kg/m.    ECOG FS:3 - Symptomatic, >50% confined to bed  General: Cachectic, no acute distress. Eyes: Pink conjunctiva, anicteric sclera. HEENT:  Normocephalic, moist mucous membranes. Lungs: No audible wheezing or coughing. Heart: Regular rate and rhythm. Abdomen: Soft, nontender, no obvious distention. Musculoskeletal: No edema, cyanosis, or clubbing. Neuro: Alert, answering all questions appropriately. Cranial nerves grossly intact. Skin: No rashes or petechiae noted. Psych: Normal affect.   LAB RESULTS:  Lab Results  Component Value Date   NA 128 (L) 09/11/2020   K 3.9 09/11/2020   CL 88 (L) 09/11/2020   CO2 33 (H) 09/11/2020   GLUCOSE 79 09/11/2020   BUN 11 09/11/2020   CREATININE 0.57 09/11/2020   CALCIUM 8.7 (L) 09/11/2020   PROT 7.3 09/11/2020   ALBUMIN 3.8 09/11/2020   AST 41 09/11/2020   ALT 31 09/11/2020   ALKPHOS 69 09/11/2020   BILITOT 0.7 09/11/2020   GFRNONAA >60 09/11/2020   GFRAA 110 08/30/2020  Lab Results  Component Value Date   WBC 2.6 (L) 09/11/2020   NEUTROABS 1.1 (L) 09/11/2020   HGB 10.2 (L) 09/11/2020   HCT 30.0 (L) 09/11/2020   MCV 92.3 09/11/2020   PLT 150 09/11/2020     STUDIES: CT CHEST W CONTRAST  Result Date: 09/11/2020 CLINICAL DATA:  Unintentional weight loss, anorexia, dysphagia EXAM: CT CHEST WITH CONTRAST TECHNIQUE: Multidetector CT imaging of the chest was performed during intravenous contrast administration. CONTRAST:  87m OMNIPAQUE IOHEXOL 300 MG/ML  SOLN COMPARISON:  None. FINDINGS: Cardiovascular: There is extensive multi-vessel coronary artery calcification. Global cardiac size is mildly enlarged. No pericardial effusion. Central pulmonary arteries are of normal caliber. Mild atherosclerotic calcification within the thoracic aorta. No aortic aneurysm. Mediastinum/Nodes: No pathologic thoracic adenopathy. The esophagus appears minimally dilated within its mid segment and demonstrates an air-fluid level suggesting changes of underlying esophageal dysmotility or gastroesophageal reflux. Lungs/Pleura: Mild centrilobular emphysema. There is complete collapse of the right  lower lobe, new since prior examination, with small associated right pleural effusion. There is abrupt occlusion of the right lower lobar bronchus suggesting an endobronchial mass such as a mucous plug or aspirated debris. Lungs are otherwise clear. No pneumothorax. No pleural effusion on the left Upper Abdomen: No acute abnormality. Musculoskeletal: Marked thoracic dextroscoliosis results in mild deformity of the thoracic cage, unchanged from prior examination. Accentuated thoracic kyphosis noted mild degenerative changes are noted along the concavity. No lytic or blastic bone lesions are seen. IMPRESSION: Complete collapse of the right lower lobe with occlusion of the a right lower lobar pulmonary bronchus suggesting and endobronchial mass such as aspirated debris or mucous plug. Correlation with bronchoscopy may be helpful. Patulous esophagus demonstrating an air-fluid level. This may reflect changes of gastroesophageal reflux or esophageal dysmotility. Extensive multi-vessel coronary artery calcification. Aortic Atherosclerosis (ICD10-I70.0) and Emphysema (ICD10-J43.9). Electronically Signed   By: AFidela SalisburyMD   On: 09/11/2020 22:35    ASSESSMENT: Anemia, leukopenia.  PLAN:    1.  Anemia, leukopenia: Suspect nutritional given patient's failure to thrive and continued weight loss.  Although patient's hemoglobin and white blood cell count are decreased, they are relatively stable for the past several months.  Previous full work-up was unrevealing.  It is possible patient has underlying MDS, but this would take a bone marrow biopsy to diagnose which is unnecessary at this time.  Will repeat iron stores with next lab draw for completeness.  No further intervention is needed.  Patient has been instructed to keep her previously scheduled follow-up in the cancer center on January 21, 2021.  Appreciate consult, will follow.   TLloyd Huger MD   09/12/2020 3:37 PM

## 2020-09-12 NOTE — Progress Notes (Signed)
Pt declines all medications as she states that she cannot swallow. Pt is given crushed and liquids meds as an effort to assist with swallowing difficulties. Pt refuses to take any medications including heparin subcutaneous injection. Pt is educated on the importance of routine medication administrations as it is vital to her plan of care. Pt declines medications again after education is given and pt verbalizes an understanding of the risk associated with refusing medications.

## 2020-09-12 NOTE — Progress Notes (Addendum)
PROGRESS NOTE    Maureen [name]   GMW:[id number]  DOB: 05-Nov-1945  PCP: Alba Cory, MD    DOA: 09/11/2020 LOS: 1   Brief Narrative   Maureen [name] is a 74 y.o. female with pertinent past medical history of chronic weight loss due to dysphagia from esophageal dysmotility and likely avoidant restrictive food intake disorder who presented to the ED on 09/11/20 on referral from primary care due to abnormal labs drawn 4 days prior.  Patient denied any specific complaints at time of admission other than generalized weakness.  Her son reported that patient has been progressively weak and had minimal PO intake aside from Ensure.     Assessment & Plan   Principal Problem:   Failure to thrive in adult Active Problems:   Hypothyroidism (acquired)   Hypertension   Dysphagia   Avoidant-restrictive food intake disorder (ARFID)   Failure to thrive in adult - pt has chronic and progressive weight loss and generalized weakness due to inadequate nutrition secondary to esophageal dysmotility with dysphagia and avoidant restrictive food intake disorder.  Pt with 1 year (or longer) history of dysphagia and resultant fear of swallowing.   --SLP consulted --Full liquid diet --IV hydration --Pt and son considering PEG tube --Dietician consulted --Palliative care consult --Continue Remeron, Klonopin PRN   Dysphagia due to esophageal dysmotility - continue IV PPI, Carafate.  Prior EGD showed non-obstructing Schaztki's ring. Avoidant-Restrictive Food Intake Disorders - due to anxiety and fear with swallowing as result of dysphagia / choking episodes.  Imaging this admission supports recurrent / chronic dysphagia, showing "Complete collapse of the right lower lobe with occlusion of the a right lower lobar pulmonary bronchus suggesting and endobronchial mass such as aspirated debris or mucous plug.... Patulous esophagus demonstrating an air-fluid level. This may reflect changes of  gastroesophageal reflux or esophageal dysmotility."   Hyponatremia - suspect hypovolemic given above history.  On IV fluids as above.  Monitor BMP.     Leukopenia - POA, chronic.  Referred to hematology by PCP.  On chart review, evaluation has been unremarkable.   Hx of Depression / Anxiety - Continue Remeron, PRN Klonopin   Hypothyroidism - TSH abnormal due to noncompliance with Synthroid.  Resume Synthroid at currently prescribed dose.  Follow up TSH in about 6 weeks.    Patient BMI: Body mass index is 16.06 kg/m.   DVT prophylaxis: heparin injection 5,000 Units Start: 09/11/20 2200 SCDs Start: 09/11/20 1510   Diet:  Diet Orders (From admission, onward)    Start     Ordered   09/11/20 1514  Diet full liquid Room service appropriate? Yes; Fluid consistency: Thin  Diet effective now       Question Answer Comment  Room service appropriate? Yes   Fluid consistency: Thin      09/11/20 1515            Code Status: DNR    Subjective 09/12/20    Pt seen with son at bedside.  She denies having any pain or discomfort, nausea or vomiting.  Has not tried to eat or drink much yet today.  We discussed potential need for feeding tube and if she would want this.  They were told it would be temporary, but in reviewing chart, appears her swallow will not likely get better, so we discussed it could be a permanent if swallowing does not improve.  They want to consider further before deciding.     Disposition Plan & Communication   Status  is: Inpatient  Remains inpatient appropriate because:Inpatient level of care appropriate due to severity of illness with inadequate PO intake and ongoing evaluation not appropriate for outpatient setting.   Dispo: The patient is from: Home              Anticipated d/c is to: Home              Anticipated d/c date is: 2 days              Patient currently is not medically stable to d/c.    Family Communication: son at bedside during encounter  today   Consults, Procedures, Significant Events   Consultants:   SLP  Procedures:   None  Antimicrobials:  Anti-infectives (From admission, onward)   None         Objective   Vitals:   09/11/20 2053 09/12/20 0019 09/12/20 0448 09/12/20 0700  BP: 104/80 106/74 (!) 82/60 98/67  Pulse: 66 66 67 70  Resp: 16 14 20 14   Temp: 97.6 F (36.4 C) 97.9 F (36.6 C) 98.2 F (36.8 C) 98.3 F (36.8 C)  TempSrc:  Oral Oral   SpO2: 100% 100% 98% 100%  Weight:      Height:        Intake/Output Summary (Last 24 hours) at 09/12/2020 1502 Last data filed at 09/12/2020 1408 Gross per 24 hour  Intake 360 ml  Output --  Net 360 ml   Filed Weights   09/11/20 1112  Weight: 38.6 kg    Physical Exam:  General exam: awake, alert, no acute distress, cachectic HEENT: moist mucus membranes, hearing grossly normal  Respiratory system: CTAB with referred upper airway sounds, no wheezes or rhonchi, normal respiratory effort. Cardiovascular system: normal S1/S2, RRR, no pedal edema.   Gastrointestinal system: sunken abdomen, NT, ND, +bowel sounds. Central nervous system: no gross focal neurologic deficits, normal speech Extremities: moves all, no edema, normal tone  Labs   Data Reviewed: I have personally reviewed following labs and imaging studies  CBC: Recent Labs  Lab 09/11/20 1140  WBC 2.6*  NEUTROABS 1.1*  HGB 10.2*  HCT 30.0*  MCV 92.3  PLT 150   Basic Metabolic Panel: Recent Labs  Lab 09/11/20 1140  NA 128*  K 3.9  CL 88*  CO2 33*  GLUCOSE 79  BUN 11  CREATININE 0.57  CALCIUM 8.7*  MG 2.3  PHOS 3.3   GFR: Estimated Creatinine Clearance: 37.6 mL/min (by C-G formula based on SCr of 0.57 mg/dL). Liver Function Tests: Recent Labs  Lab 09/11/20 1140  AST 41  ALT 31  ALKPHOS 69  BILITOT 0.7  PROT 7.3  ALBUMIN 3.8   Recent Labs  Lab 09/11/20 1140  LIPASE 31   No results for input(s): AMMONIA in the last 168 hours. Coagulation Profile: No  results for input(s): INR, PROTIME in the last 168 hours. Cardiac Enzymes: No results for input(s): CKTOTAL, CKMB, CKMBINDEX, TROPONINI in the last 168 hours. BNP (last 3 results) No results for input(s): PROBNP in the last 8760 hours. HbA1C: No results for input(s): HGBA1C in the last 72 hours. CBG: No results for input(s): GLUCAP in the last 168 hours. Lipid Profile: No results for input(s): CHOL, HDL, LDLCALC, TRIG, CHOLHDL, LDLDIRECT in the last 72 hours. Thyroid Function Tests: Recent Labs    09/11/20 1140  TSH 17.499*   Anemia Panel: No results for input(s): VITAMINB12, FOLATE, FERRITIN, TIBC, IRON, RETICCTPCT in the last 72 hours. Sepsis Labs: No results for  input(s): PROCALCITON, LATICACIDVEN in the last 168 hours.  Recent Results (from the past 240 hour(s))  Respiratory Panel by RT PCR (Flu A&B, Covid) - Nasopharyngeal Swab     Status: None   Collection Time: 09/11/20  2:46 PM   Specimen: Nasopharyngeal Swab  Result Value Ref Range Status   SARS Coronavirus 2 by RT PCR NEGATIVE NEGATIVE Final    Comment: (NOTE) SARS-CoV-2 target nucleic acids are NOT DETECTED.  The SARS-CoV-2 RNA is generally detectable in upper respiratoy specimens during the acute phase of infection. The lowest concentration of SARS-CoV-2 viral copies this assay can detect is 131 copies/mL. A negative result does not preclude SARS-Cov-2 infection and should not be used as the sole basis for treatment or other patient management decisions. A negative result may occur with  improper specimen collection/handling, submission of specimen other than nasopharyngeal swab, presence of viral mutation(s) within the areas targeted by this assay, and inadequate number of viral copies (<131 copies/mL). A negative result must be combined with clinical observations, patient history, and epidemiological information. The expected result is Negative.  Fact Sheet for Patients:   https://www.moore.com/  Fact Sheet for Healthcare Providers:  https://www.young.biz/  This test is no t yet approved or cleared by the [place] FDA and  has been authorized for detection and/or diagnosis of SARS-CoV-2 by FDA under an Emergency Use Authorization (EUA). This EUA will remain  in effect (meaning this test can be used) for the duration of the COVID-19 declaration under Section 564(b)(1) of the Act, 21 U.S.C. section 360bbb-3(b)(1), unless the authorization is terminated or revoked sooner.     Influenza A by PCR NEGATIVE NEGATIVE Final   Influenza B by PCR NEGATIVE NEGATIVE Final    Comment: (NOTE) The Xpert Xpress SARS-CoV-2/FLU/RSV assay is intended as an aid in  the diagnosis of influenza from Nasopharyngeal swab specimens and  should not be used as a sole basis for treatment. Nasal washings and  aspirates are unacceptable for Xpert Xpress SARS-CoV-2/FLU/RSV  testing.  Fact Sheet for Patients: https://www.moore.com/  Fact Sheet for Healthcare Providers: https://www.young.biz/  This test is not yet approved or cleared by the [place] FDA and  has been authorized for detection and/or diagnosis of SARS-CoV-2 by  FDA under an Emergency Use Authorization (EUA). This EUA will remain  in effect (meaning this test can be used) for the duration of the  Covid-19 declaration under Section 564(b)(1) of the Act, 21  U.S.C. section 360bbb-3(b)(1), unless the authorization is  terminated or revoked. Performed at [facility], [address] [place], [place] [address]       Imaging Studies   CT CHEST W CONTRAST  Result Date: 09/11/2020 CLINICAL DATA:  Unintentional weight loss, anorexia, dysphagia EXAM: CT CHEST WITH CONTRAST TECHNIQUE: Multidetector CT imaging of the chest was performed during intravenous contrast administration. CONTRAST:  63mL OMNIPAQUE IOHEXOL 300 MG/ML   SOLN COMPARISON:  None. FINDINGS: Cardiovascular: There is extensive multi-vessel coronary artery calcification. Global cardiac size is mildly enlarged. No pericardial effusion. Central pulmonary arteries are of normal caliber. Mild atherosclerotic calcification within the thoracic aorta. No aortic aneurysm. Mediastinum/Nodes: No pathologic thoracic adenopathy. The esophagus appears minimally dilated within its mid segment and demonstrates an air-fluid level suggesting changes of underlying esophageal dysmotility or gastroesophageal reflux. Lungs/Pleura: Mild centrilobular emphysema. There is complete collapse of the right lower lobe, new since prior examination, with small associated right pleural effusion. There is abrupt occlusion of the right lower lobar bronchus suggesting an endobronchial mass  such as a mucous plug or aspirated debris. Lungs are otherwise clear. No pneumothorax. No pleural effusion on the left Upper Abdomen: No acute abnormality. Musculoskeletal: Marked thoracic dextroscoliosis results in mild deformity of the thoracic cage, unchanged from prior examination. Accentuated thoracic kyphosis noted mild degenerative changes are noted along the concavity. No lytic or blastic bone lesions are seen. IMPRESSION: Complete collapse of the right lower lobe with occlusion of the a right lower lobar pulmonary bronchus suggesting and endobronchial mass such as aspirated debris or mucous plug. Correlation with bronchoscopy may be helpful. Patulous esophagus demonstrating an air-fluid level. This may reflect changes of gastroesophageal reflux or esophageal dysmotility. Extensive multi-vessel coronary artery calcification. Aortic Atherosclerosis (ICD10-I70.0) and Emphysema (ICD10-J43.9). Electronically Signed   By: Helyn Numbers MD   On: 09/11/2020 22:35     Medications   Scheduled Meds:  clonazepam  0.125 mg Oral Daily   heparin  5,000 Units Subcutaneous Q8H   levothyroxine  88 mcg Oral QAC  breakfast   mirtazapine  15 mg Oral QHS   pantoprazole sodium  40 mg Oral BID   Continuous Infusions:  sodium chloride 100 mL/hr at 09/11/20 2320       LOS: 1 day    Time spent: 30 minutes    Pennie Banter, DO [facility]  09/12/2020, 3:02 PM    If 7PM-7AM, please contact night-coverage. How to contact the [facility] Attending or Consulting provider 7A - 7P or covering provider during after hours 7P -7A, for this patient?    1. Check the care team in [facility] and look for a) attending/consulting [facility] provider listed and b) the [facility] team listed 2. Log into [email or web address] and use [place]'s universal password to access. If you do not have the password, please contact the hospital operator. 3. Locate the [facility] provider you are looking for under [facility] and page to a number that you can be directly reached. 4. If you still have difficulty reaching the provider, please page the [facility] (Director on Call) for the Hospitalists listed on amion for assistance.

## 2020-09-12 NOTE — Progress Notes (Signed)
Patient refused heparin injection for DVT prophylaxis.  Offered injection and patient refused stating that God had been good to her and she has never had a "blood clog" before and does not think she will get one.  I educated patient about being more sedative in the hospital and danger of developing blood clot as a result.  Patient still refused and said she might ask for it later.  MD made aware

## 2020-09-13 ENCOUNTER — Encounter: Payer: Self-pay | Admitting: Internal Medicine

## 2020-09-13 DIAGNOSIS — R1319 Other dysphagia: Secondary | ICD-10-CM | POA: Diagnosis not present

## 2020-09-13 DIAGNOSIS — Z7189 Other specified counseling: Secondary | ICD-10-CM

## 2020-09-13 DIAGNOSIS — E43 Unspecified severe protein-calorie malnutrition: Secondary | ICD-10-CM | POA: Diagnosis not present

## 2020-09-13 DIAGNOSIS — K224 Dyskinesia of esophagus: Secondary | ICD-10-CM | POA: Diagnosis not present

## 2020-09-13 DIAGNOSIS — Z515 Encounter for palliative care: Secondary | ICD-10-CM

## 2020-09-13 DIAGNOSIS — F5082 Avoidant/restrictive food intake disorder: Secondary | ICD-10-CM | POA: Diagnosis not present

## 2020-09-13 DIAGNOSIS — R627 Adult failure to thrive: Secondary | ICD-10-CM | POA: Diagnosis not present

## 2020-09-13 LAB — PHOSPHORUS: Phosphorus: 3.6 mg/dL (ref 2.5–4.6)

## 2020-09-13 LAB — CBC
HCT: 28 % — ABNORMAL LOW (ref 36.0–46.0)
Hemoglobin: 9.8 g/dL — ABNORMAL LOW (ref 12.0–15.0)
MCH: 31.7 pg (ref 26.0–34.0)
MCHC: 35 g/dL (ref 30.0–36.0)
MCV: 90.6 fL (ref 80.0–100.0)
Platelets: 140 10*3/uL — ABNORMAL LOW (ref 150–400)
RBC: 3.09 MIL/uL — ABNORMAL LOW (ref 3.87–5.11)
RDW: 14.5 % (ref 11.5–15.5)
WBC: 2.6 10*3/uL — ABNORMAL LOW (ref 4.0–10.5)
nRBC: 0 % (ref 0.0–0.2)

## 2020-09-13 LAB — MAGNESIUM: Magnesium: 2.1 mg/dL (ref 1.7–2.4)

## 2020-09-13 LAB — BASIC METABOLIC PANEL
Anion gap: 9 (ref 5–15)
BUN: 8 mg/dL (ref 8–23)
CO2: 27 mmol/L (ref 22–32)
Calcium: 8.4 mg/dL — ABNORMAL LOW (ref 8.9–10.3)
Chloride: 95 mmol/L — ABNORMAL LOW (ref 98–111)
Creatinine, Ser: 0.44 mg/dL (ref 0.44–1.00)
GFR, Estimated: >60 mL/min (ref >60)
Glucose, Bld: 71 mg/dL (ref 70–99)
Potassium: 4 mmol/L (ref 3.5–5.1)
Sodium: 131 mmol/L — ABNORMAL LOW (ref 135–145)

## 2020-09-13 LAB — FERRITIN: Ferritin: 300 ng/mL (ref 11–307)

## 2020-09-13 LAB — IRON AND TIBC
Iron: 53 ug/dL (ref 28–170)
Saturation Ratios: 23 % (ref 10.4–31.8)
TIBC: 227 ug/dL — ABNORMAL LOW (ref 250–450)
UIBC: 174 ug/dL

## 2020-09-13 LAB — VITAMIN D 25 HYDROXY (VIT D DEFICIENCY, FRACTURES): Vit D, 25-Hydroxy: 16.9 ng/mL — ABNORMAL LOW (ref 30–100)

## 2020-09-13 NOTE — Progress Notes (Signed)
Calorie Count Note  Diet: Full liquid Supplements:  -Ensure Enlive po TID, each supplement provides 350 kcal and 20 grams of protein -Magic cup BID with meals, each supplement provides 290 kcal and 9 grams of protein  Day 1 Results: 11/18 Breakfast: (per pt) 100% Cream of Wheat, 100% orange juice (295 kcal, 5 grams of protein)  11/17 Lunch: (documented on ticket) 100% chicken broth, 100% vanilla ice cream x 2 (290.49 kcal, 4.89 grams protein)  11/17 Dinner: (per pt) 100% chicken broth, 100% magic cup (320.49 kcal, 9.89 grams protein)  Supplements: (per pt) 100% Ensure Enlive x 2  Day 1Total intake:  1606 kcal (115% of minimum estimated needs)  60 grams protein (86% of minimum estimated needs)   Lars Masson, RD, LDN Clinical Nutrition After Hours/Weekend Pager # in Amion

## 2020-09-13 NOTE — Consult Note (Signed)
Consultation Note Date: 09/13/2020   Patient Name: Maureen [name]  DOB: February 01, 1946  MRN: [id number]  Age / Sex: 74 y.o., female  PCP: Alba Cory, MD Referring Physician: Pennie Banter, DO  Reason for Consultation: Establishing goals of care    HPI/Patient Profile: 74 y.o. female  with past medical history of dysphagia, hypothyroid, weight loss, depression, a fib, admitted on 09/11/2020 due to being instructed by her MD to come to hospital because of low WBC. She has increasing weakness and poor po intake.   CT of chest showed complete collapse of RLL with occlusion of the RLL pulmonary bronchus suggesting endobrachial mass such as aspirated debris or mucous plug, as well as patulous esophagus with air fluid level reflecting possibly esophageal dysmotility. She was seen in March by GI at which time she was referred to [facility] for esophageal manometry- however, it does not appear she had this completed- I do not see further followup with GI.  Palliative medicine consulted in patient with failure to thrive.   Clinical Assessment and Goals of Care: Patient awake and alert. Appears frail, cachetic. Introduced Palliative medicine.  Maureen [name] lives at home with her son. Prior to admission she was ambulatory and independent with ADL's, however, the increasing weakness has caused her functional status to decline.  She finds joy in "spending time with happy people". She enjoys her church friends. At this moment she is looking forward to lunch.  Attempted to discuss her current illness and possible trajectories. She tells me she has discussed this several times with people today and she does not wish to discuss further.  Attempted goals of care discussion. She hopes to regain some strength so that she can return to an independent living state. We discussed her fear of swallowing- even medications- and how when she  doesn't take her thyroid medication this can increase her depression and weakness. She was able to take it this morning.  Option for artificial feeding discussed. She was previously uncertain, however, she has decided she would like to proceed with feeding tube. We discussed the other option would be comfort measures and comfort feeding- and supporting her through what may be end of life in the way that she would want to spend it. She was reluctant to discuss end of life issues. I think this may the first time that it has been suggested that her state is such that she could eventually die. Her goals for now are to continue to treat and make attempts at finding solution for her nutritional status. She is currently DNR. If she were at end of life her goals would be to spend her time at home and with her family.  She expressed that she would like to proceed with feeding tube at this time.  I called her son and he confirmed that he had discussed this with her and this was her decision.    Primary Decision Maker PATIENT    SUMMARY OF RECOMMENDATIONS -Continue current scope -DNR -Patient and son agree to feeding tube -  Hypothyroid is likely contributing to depression and weakness -Would likely benefit from GI consult for esophageal motility- may benefit from calcium channel blocker therapy as it seems PPI has not improved symptoms      Code Status/Advance Care Planning:  DNR  Prognosis:    Unable to determine  Discharge Planning: To Be Determined  Primary Diagnoses: Present on Admission: . Hypothyroidism (acquired) . Hypertension . Failure to thrive in adult . Avoidant-restrictive food intake disorder (ARFID)   I have reviewed the medical record, interviewed the patient and family, and examined the patient. The following aspects are pertinent.  Past Medical History:  Diagnosis Date  . Allergy   . Corn of toe   . Depression   . First degree AV block   . GERD (gastroesophageal  reflux disease)   . Hammer toe of right foot   . Hyperglycemia   . Hyperlipidemia   . Hypertension   . Loss of appetite   . Prolapse of urethra   . Scoliosis (and kyphoscoliosis), idiopathic   . Thyroid disease   . Tinnitus of both ears   . Vitreous floaters of right eye    Social History   Socioeconomic History  . Marital status: Widowed    Spouse name: Not on file  . Number of children: 3  . Years of education: 82  . Highest education level: 12th grade  Occupational History  . Occupation: Retired  Tobacco Use  . Smoking status: Never Smoker  . Smokeless tobacco: Never Used  . Tobacco comment: smoking cessation materials not required  Vaping Use  . Vaping Use: Never used  Substance and Sexual Activity  . Alcohol use: Never    Alcohol/week: 0.0 standard drinks  . Drug use: No  . Sexual activity: Not Currently    Partners: Male  Other Topics Concern  . Not on file  Social History Narrative   Pt lives alone; son Verner Chol nearby and helps almost daily   Social Determinants of Health   Financial Resource Strain: Low Risk   . Difficulty of Paying Living Expenses: Not hard at all  Food Insecurity: No Food Insecurity  . Worried About Programme researcher, broadcasting/film/video in the Last Year: Never true  . Ran Out of Food in the Last Year: Never true  Transportation Needs: No Transportation Needs  . Lack of Transportation (Medical): No  . Lack of Transportation (Non-Medical): No  Physical Activity: Inactive  . Days of Exercise per Week: 0 days  . Minutes of Exercise per Session: 0 min  Stress: Stress Concern Present  . Feeling of Stress : Rather much  Social Connections: Moderately Integrated  . Frequency of Communication with Friends and Family: More than three times a week  . Frequency of Social Gatherings with Friends and Family: More than three times a week  . Attends Religious Services: More than 4 times per year  . Active Member of Clubs or Organizations: Yes  . Attends Tax inspector Meetings: More than 4 times per year  . Marital Status: Widowed   Scheduled Meds: . clonazepam  0.125 mg Oral Daily  . feeding supplement  237 mL Oral TID BM  . heparin  5,000 Units Subcutaneous Q8H  . levothyroxine  88 mcg Oral QAC breakfast  . mirtazapine  15 mg Oral QHS  . pantoprazole sodium  40 mg Oral BID   Continuous Infusions: . sodium chloride 100 mL/hr at 09/11/20 2320   PRN Meds:. Medications Prior to Admission:  Prior to  Admission medications   Medication Sig Start Date End Date Taking? Authorizing Provider  clonazepam (KLONOPIN) 0.125 MG disintegrating tablet Take 1 tablet (0.125 mg total) by mouth 2 (two) times daily. Patient taking differently: Take 0.125 mg by mouth daily.  04/27/20  Yes Sowles, Danna Hefty, MD  levothyroxine (SYNTHROID) 88 MCG tablet Take 1 tablet (88 mcg total) by mouth daily before breakfast. 05/01/20  Yes Sowles, Danna Hefty, MD  omeprazole (PRILOSEC) 40 MG capsule Take 1 capsule (40 mg total) by mouth daily before breakfast. 11/01/19  Yes Vanga, Loel Dubonnet, MD  Ensure (ENSURE) Take 237 mLs by mouth. Pt drinking approx 3 ensures per day    [provider]  Iron-Vitamins (GERITOL COMPLETE PO) Take 1 tablet by mouth daily. Patient not taking: Reported on 05/29/2020    [provider]  mirtazapine (REMERON) 15 MG tablet 7.5 mg at night for two weeks, then 15 mg at night Patient not taking: Reported on 05/29/2020 02/02/20   Neysa Hotter, MD   Allergies  Allergen Reactions  . Aspirin     Tinnitus  . Citalopram Other (See Comments)  . Codeine Nausea And Vomiting  . Penicillins Rash    Has patient had a PCN reaction causing immediate rash, facial/tongue/throat swelling, SOB or lightheadedness with hypotension: Yes Has patient had a PCN reaction causing severe rash involving mucus membranes or skin necrosis: No Has patient had a PCN reaction that required hospitalization No Has patient had a PCN reaction occurring within the last 10  years: No If all of the above answers are "NO", then may proceed with Cephalosporin use.   Review of Systems  Constitutional: Positive for activity change and appetite change.    Physical Exam Vitals and nursing note reviewed.  Constitutional:      Comments: cachetic  Cardiovascular:     Rate and Rhythm: Normal rate.  Pulmonary:     Effort: Pulmonary effort is normal.  Neurological:     General: No focal deficit present.     Vital Signs: BP 116/64 (BP Location: Right Arm)   Pulse 73   Temp 97.6 F (36.4 C) (Oral)   Resp 18   Ht 5\' 1"  (1.549 m)   Wt 38.6 kg   SpO2 96%   BMI 16.06 kg/m  Pain Scale: 0-10   Pain Score: 0-No pain   SpO2: SpO2: 96 % O2 Device:SpO2: 96 % O2 Flow Rate: .   IO: Intake/output summary:   Intake/Output Summary (Last 24 hours) at 09/13/2020 1245 Last data filed at 09/12/2020 1408 Gross per 24 hour  Intake 360 ml  Output --  Net 360 ml    LBM: Last BM Date:  (PTA; pt not sure) Baseline Weight: Weight: 38.6 kg Most recent weight: Weight: 38.6 kg     Palliative Assessment/Data: PPS: 50%     Thank you for this consult. Palliative medicine will continue to follow and assist as needed.   Time In: 1146 Time Out: 1306 Time Total: 76 mins Greater than 50%  of this time was spent counseling and coordinating care related to the above assessment and plan.  Signed by: 1307, AGNP-C Palliative Medicine    Please contact Palliative Medicine Team phone at [phone] for questions and concerns.  For individual provider: See [phone]

## 2020-09-13 NOTE — Progress Notes (Signed)
PROGRESS NOTE    Maureen [name]   AOZ:[id number]  DOB: 1946/06/24  PCP: Alba Cory, MD    DOA: 09/11/2020 LOS: 2   Brief Narrative   Maureen [name] is a 74 y.o. female with pertinent past medical history of chronic weight loss due to dysphagia from esophageal dysmotility and likely avoidant restrictive food intake disorder who presented to the ED on 09/11/20 on referral from primary care due to abnormal labs drawn 4 days prior.  Patient denied any specific complaints at time of admission other than generalized weakness.  Her son reported that patient has been progressively weak and had minimal PO intake aside from Ensure.     Assessment & Plan   Principal Problem:   Failure to thrive in adult Active Problems:   Hypothyroidism (acquired)   Hypertension   Dysphagia   Avoidant-restrictive food intake disorder (ARFID)   Protein-calorie malnutrition, severe   Failure to thrive in adult - pt has chronic and progressive weight loss and generalized weakness due to inadequate nutrition secondary to esophageal dysmotility with dysphagia and avoidant restrictive food intake disorder.  Pt with 1 year (or longer) history of dysphagia and resultant fear of swallowing.   11/18 - per patient tolerating full liquid diet okay --SLP consulted --Full liquid diet --IV hydration --Pt and son considering PEG tube --Dietician consulted --Palliative care consult --Continue Remeron, Klonopin PRN   Dysphagia due to esophageal dysmotility - continue IV PPI, Carafate.  Prior EGD showed non-obstructing Schaztki's ring. Avoidant-Restrictive Food Intake Disorders - due to anxiety and fear with swallowing as result of dysphagia / choking episodes.  Imaging this admission supports recurrent / chronic dysphagia, showing "Complete collapse of the right lower lobe with occlusion of the a right lower lobar pulmonary bronchus suggesting and endobronchial mass such as aspirated debris or mucous  plug.... Patulous esophagus demonstrating an air-fluid level. This may reflect changes of gastroesophageal reflux or esophageal dysmotility."   Hyponatremia - suspect hypovolemic given above history.  Improving.  On IV fluids as above.  Monitor BMP.     Leukopenia - POA, chronic.  Referred to hematology by PCP.  On chart review, evaluation has been unremarkable.   Hx of Depression / Anxiety - Continue Remeron, PRN Klonopin   Hypothyroidism - TSH abnormal due to noncompliance with Synthroid.  Resume Synthroid at currently prescribed dose.  Follow up TSH in about 6 weeks.    Patient BMI: Body mass index is 16.06 kg/m.   DVT prophylaxis: heparin injection 5,000 Units Start: 09/11/20 2200 SCDs Start: 09/11/20 1510   Diet:  Diet Orders (From admission, onward)    Start     Ordered   09/11/20 1514  Diet full liquid Room service appropriate? Yes; Fluid consistency: Thin  Diet effective now       Question Answer Comment  Room service appropriate? Yes   Fluid consistency: Thin      09/11/20 1515            Code Status: DNR    Subjective 09/13/20    Pt seen at bedside shortly after breakfast today.  She reports eating cream of wheat and an Ensure and did well, no choking or coughing spells.  Denies fever/chills or cough.  Requesting scissors to cut denture adhesive.     Disposition Plan & Communication   Status is: Inpatient  Remains inpatient appropriate because:Inpatient level of care appropriate due to severity of illness with inadequate PO intake and ongoing evaluation not appropriate for outpatient setting.  Dispo: The patient is from: Home              Anticipated d/c is to: Home              Anticipated d/c date is: 2-3 days              Patient currently is not medically stable to d/c.    Family Communication: son at bedside during encounter on 11/17.  Will attempt call this afternoon.   Consults, Procedures, Significant Events   Consultants:    Palliative Care  Procedures:   None  Antimicrobials:  Anti-infectives (From admission, onward)   None         Objective   Vitals:   09/12/20 2344 09/13/20 0427 09/13/20 0741 09/13/20 1100  BP: 103/66 100/72 106/74 116/64  Pulse: 69 71 66 73  Resp: 18 14 18    Temp: 98.6 F (37 C) 97.9 F (36.6 C) 98.4 F (36.9 C) 97.6 F (36.4 C)  TempSrc: Oral Oral Oral Oral  SpO2: 100% 100% 100% 96%  Weight:      Height:        Intake/Output Summary (Last 24 hours) at 09/13/2020 1638 Last data filed at 09/13/2020 1510 Gross per 24 hour  Intake 1000 ml  Output --  Net 1000 ml   Filed Weights   09/11/20 1112  Weight: 38.6 kg    Physical Exam:  General exam: awake, alert, no acute distress, cachectic, more conversant today Respiratory system: CTAB, no wheezes or rhonchi, normal respiratory effort, on room air. Cardiovascular system: normal S1/S2, RRR, no pedal edema.   Gastrointestinal system: sunken abdomen, NT, ND, +bowel sounds. Central nervous system: no gross focal neurologic deficits, normal speech Extremities: moves all, no edema, normal tone  Labs   Data Reviewed: I have personally reviewed following labs and imaging studies  CBC: Recent Labs  Lab 09/11/20 1140 09/13/20 0540  WBC 2.6* 2.6*  NEUTROABS 1.1*  --   HGB 10.2* 9.8*  HCT 30.0* 28.0*  MCV 92.3 90.6  PLT 150 140*   Basic Metabolic Panel: Recent Labs  Lab 09/11/20 1140 09/13/20 0540  NA 128* 131*  K 3.9 4.0  CL 88* 95*  CO2 33* 27  GLUCOSE 79 71  BUN 11 8  CREATININE 0.57 0.44  CALCIUM 8.7* 8.4*  MG 2.3 2.1  PHOS 3.3 3.6   GFR: Estimated Creatinine Clearance: 37.6 mL/min (by C-G formula based on SCr of 0.44 mg/dL). Liver Function Tests: Recent Labs  Lab 09/11/20 1140  AST 41  ALT 31  ALKPHOS 69  BILITOT 0.7  PROT 7.3  ALBUMIN 3.8   Recent Labs  Lab 09/11/20 1140  LIPASE 31   No results for input(s): AMMONIA in the last 168 hours. Coagulation Profile: No results  for input(s): INR, PROTIME in the last 168 hours. Cardiac Enzymes: No results for input(s): CKTOTAL, CKMB, CKMBINDEX, TROPONINI in the last 168 hours. BNP (last 3 results) No results for input(s): PROBNP in the last 8760 hours. HbA1C: No results for input(s): HGBA1C in the last 72 hours. CBG: No results for input(s): GLUCAP in the last 168 hours. Lipid Profile: No results for input(s): CHOL, HDL, LDLCALC, TRIG, CHOLHDL, LDLDIRECT in the last 72 hours. Thyroid Function Tests: Recent Labs    09/11/20 1140  TSH 17.499*   Anemia Panel: Recent Labs    09/13/20 0540  FERRITIN 300  TIBC 227*  IRON 53   Sepsis Labs: No results for input(s): PROCALCITON, LATICACIDVEN in the  last 168 hours.  Recent Results (from the past 240 hour(s))  Respiratory Panel by RT PCR (Flu A&B, Covid) - Nasopharyngeal Swab     Status: None   Collection Time: 09/11/20  2:46 PM   Specimen: Nasopharyngeal Swab  Result Value Ref Range Status   SARS Coronavirus 2 by RT PCR NEGATIVE NEGATIVE Final    Comment: (NOTE) SARS-CoV-2 target nucleic acids are NOT DETECTED.  The SARS-CoV-2 RNA is generally detectable in upper respiratoy specimens during the acute phase of infection. The lowest concentration of SARS-CoV-2 viral copies this assay can detect is 131 copies/mL. A negative result does not preclude SARS-Cov-2 infection and should not be used as the sole basis for treatment or other patient management decisions. A negative result may occur with  improper specimen collection/handling, submission of specimen other than nasopharyngeal swab, presence of viral mutation(s) within the areas targeted by this assay, and inadequate number of viral copies (<131 copies/mL). A negative result must be combined with clinical observations, patient history, and epidemiological information. The expected result is Negative.  Fact Sheet for Patients:  https://www.moore.com/  Fact Sheet for Healthcare  Providers:  https://www.young.biz/  This test is no t yet approved or cleared by the [place] FDA and  has been authorized for detection and/or diagnosis of SARS-CoV-2 by FDA under an Emergency Use Authorization (EUA). This EUA will remain  in effect (meaning this test can be used) for the duration of the COVID-19 declaration under Section 564(b)(1) of the Act, 21 U.S.C. section 360bbb-3(b)(1), unless the authorization is terminated or revoked sooner.     Influenza A by PCR NEGATIVE NEGATIVE Final   Influenza B by PCR NEGATIVE NEGATIVE Final    Comment: (NOTE) The Xpert Xpress SARS-CoV-2/FLU/RSV assay is intended as an aid in  the diagnosis of influenza from Nasopharyngeal swab specimens and  should not be used as a sole basis for treatment. Nasal washings and  aspirates are unacceptable for Xpert Xpress SARS-CoV-2/FLU/RSV  testing.  Fact Sheet for Patients: https://www.moore.com/  Fact Sheet for Healthcare Providers: https://www.young.biz/  This test is not yet approved or cleared by the [place] FDA and  has been authorized for detection and/or diagnosis of SARS-CoV-2 by  FDA under an Emergency Use Authorization (EUA). This EUA will remain  in effect (meaning this test can be used) for the duration of the  Covid-19 declaration under Section 564(b)(1) of the Act, 21  U.S.C. section 360bbb-3(b)(1), unless the authorization is  terminated or revoked. Performed at [facility], [address] [place], [place] [address]       Imaging Studies   CT CHEST W CONTRAST  Result Date: 09/11/2020 CLINICAL DATA:  Unintentional weight loss, anorexia, dysphagia EXAM: CT CHEST WITH CONTRAST TECHNIQUE: Multidetector CT imaging of the chest was performed during intravenous contrast administration. CONTRAST:  37mL OMNIPAQUE IOHEXOL 300 MG/ML  SOLN COMPARISON:  None. FINDINGS: Cardiovascular: There is extensive  multi-vessel coronary artery calcification. Global cardiac size is mildly enlarged. No pericardial effusion. Central pulmonary arteries are of normal caliber. Mild atherosclerotic calcification within the thoracic aorta. No aortic aneurysm. Mediastinum/Nodes: No pathologic thoracic adenopathy. The esophagus appears minimally dilated within its mid segment and demonstrates an air-fluid level suggesting changes of underlying esophageal dysmotility or gastroesophageal reflux. Lungs/Pleura: Mild centrilobular emphysema. There is complete collapse of the right lower lobe, new since prior examination, with small associated right pleural effusion. There is abrupt occlusion of the right lower lobar bronchus suggesting an endobronchial mass such as a mucous plug  or aspirated debris. Lungs are otherwise clear. No pneumothorax. No pleural effusion on the left Upper Abdomen: No acute abnormality. Musculoskeletal: Marked thoracic dextroscoliosis results in mild deformity of the thoracic cage, unchanged from prior examination. Accentuated thoracic kyphosis noted mild degenerative changes are noted along the concavity. No lytic or blastic bone lesions are seen. IMPRESSION: Complete collapse of the right lower lobe with occlusion of the a right lower lobar pulmonary bronchus suggesting and endobronchial mass such as aspirated debris or mucous plug. Correlation with bronchoscopy may be helpful. Patulous esophagus demonstrating an air-fluid level. This may reflect changes of gastroesophageal reflux or esophageal dysmotility. Extensive multi-vessel coronary artery calcification. Aortic Atherosclerosis (ICD10-I70.0) and Emphysema (ICD10-J43.9). Electronically Signed   By: Helyn NumbersAshesh  Parikh MD   On: 09/11/2020 22:35     Medications   Scheduled Meds: . clonazepam  0.125 mg Oral Daily  . feeding supplement  237 mL Oral TID BM  . heparin  5,000 Units Subcutaneous Q8H  . levothyroxine  88 mcg Oral QAC breakfast  . mirtazapine  15 mg  Oral QHS  . pantoprazole sodium  40 mg Oral BID   Continuous Infusions: . sodium chloride Stopped (09/13/20 1510)       LOS: 2 days    Time spent: 25 minutes with > 50% spent in coordination of care and direct patient contact.    Pennie BanterKelly A Mikya Don, DO [facility]  09/13/2020, 4:38 PM    If 7PM-7AM, please contact night-coverage. How to contact the [facility] Attending or Consulting provider 7A - 7P or covering provider during after hours 7P -7A, for this patient?    1. Check the care team in [facility] and look for a) attending/consulting [facility] provider listed and b) the [facility] team listed 2. Log into [email or web address] and use [place]'s universal password to access. If you do not have the password, please contact the hospital operator. 3. Locate the [facility] provider you are looking for under [facility] and page to a number that you can be directly reached. 4. If you still have difficulty reaching the provider, please page the [facility] (Director on Call) for the Hospitalists listed on amion for assistance.

## 2020-09-13 NOTE — Progress Notes (Signed)
PT Cancellation Note  Patient Details Name: Maureen [name] MRN: [id number] DOB: [birth year]   Cancelled Treatment:    Reason Eval/Treat Not Completed: Patient declined, no reason specified Chart reviewed and spoke with nurse who states she has been up to chair/BSC but needs to get walking.  On arrival pt sites "almost dinner time" as reason not to work with PT, offered to finish session up in recliner to eat and she states "I'm eating sitting up in this bed."  Both PT and son (in room) tried many different ways to convince her to participate with varying degrees of activity - she continues to stoutly refuses.  States she will be willing to try tomorrow but simply was adamant about not doing it today.  Malachi Pro, DPT 09/13/2020, 4:50 PM

## 2020-09-14 DIAGNOSIS — F5082 Avoidant/restrictive food intake disorder: Secondary | ICD-10-CM | POA: Diagnosis not present

## 2020-09-14 DIAGNOSIS — K224 Dyskinesia of esophagus: Secondary | ICD-10-CM

## 2020-09-14 DIAGNOSIS — R627 Adult failure to thrive: Secondary | ICD-10-CM | POA: Diagnosis not present

## 2020-09-14 DIAGNOSIS — E039 Hypothyroidism, unspecified: Secondary | ICD-10-CM

## 2020-09-14 DIAGNOSIS — Z7189 Other specified counseling: Secondary | ICD-10-CM | POA: Diagnosis not present

## 2020-09-14 DIAGNOSIS — E43 Unspecified severe protein-calorie malnutrition: Secondary | ICD-10-CM | POA: Diagnosis not present

## 2020-09-14 DIAGNOSIS — Z515 Encounter for palliative care: Secondary | ICD-10-CM | POA: Diagnosis not present

## 2020-09-14 LAB — BASIC METABOLIC PANEL
Anion gap: 5 (ref 5–15)
BUN: 11 mg/dL (ref 8–23)
CO2: 29 mmol/L (ref 22–32)
Calcium: 8 mg/dL — ABNORMAL LOW (ref 8.9–10.3)
Chloride: 99 mmol/L (ref 98–111)
Creatinine, Ser: 0.51 mg/dL (ref 0.44–1.00)
GFR, Estimated: >60 mL/min (ref >60)
Glucose, Bld: 77 mg/dL (ref 70–99)
Potassium: 3.9 mmol/L (ref 3.5–5.1)
Sodium: 133 mmol/L — ABNORMAL LOW (ref 135–145)

## 2020-09-14 LAB — MAGNESIUM: Magnesium: 2.1 mg/dL (ref 1.7–2.4)

## 2020-09-14 LAB — CBC
HCT: 26 % — ABNORMAL LOW (ref 36.0–46.0)
Hemoglobin: 9.1 g/dL — ABNORMAL LOW (ref 12.0–15.0)
MCH: 31.9 pg (ref 26.0–34.0)
MCHC: 35 g/dL (ref 30.0–36.0)
MCV: 91.2 fL (ref 80.0–100.0)
Platelets: 139 10*3/uL — ABNORMAL LOW (ref 150–400)
RBC: 2.85 MIL/uL — ABNORMAL LOW (ref 3.87–5.11)
RDW: 14.1 % (ref 11.5–15.5)
WBC: 2.3 10*3/uL — ABNORMAL LOW (ref 4.0–10.5)
nRBC: 0 % (ref 0.0–0.2)

## 2020-09-14 LAB — PHOSPHORUS: Phosphorus: 3.2 mg/dL (ref 2.5–4.6)

## 2020-09-14 MED ORDER — ENOXAPARIN SODIUM 30 MG/0.3ML ~~LOC~~ SOLN
30.0000 mg | SUBCUTANEOUS | Status: DC
  Filled 2020-09-14 (×2): qty 0.3

## 2020-09-14 MED ORDER — VITAMIN D (ERGOCALCIFEROL) 1.25 MG (50000 UNIT) PO CAPS
50000.0000 [IU] | ORAL_CAPSULE | ORAL | Status: DC
  Filled 2020-09-14: qty 1

## 2020-09-14 NOTE — Consult Note (Signed)
GI Inpatient Consult Note  Reason for Consult: PEG Tube Consult    Attending Requesting Consult: Dr. Esaw GrandchildKelly Griffith, DO  History of Present Illness: Maureen [name] is a 74 y.o. female seen for evaluation of PEG tube consult at the request of Dr. Denton LankGriffith. Pt has a PMH of hypothyroidism, HTN, avoidant-restrictive food intake disorder, dysphagia 2/2 esophageal dysmotility and restrictive intake disorder, and progressive weight loss. She was admitted 11/16 for abnormal labs drawn by her PCP and generalized weakness. Son had reported patient has been dealing with progressive weakness and minimal PO intake aside from Ensure. Patient has been seen as an outpatient by Dr. Allegra LaiVanga where she had EGD earlier this year in January showing a non-obstructive Schatzki ring. Esophageal dilatation was attempted, but not amendable as there was no luminal narrowing. She last saw Dr. Allegra LaiVanga in March 2021 where she was [facility] and follow mechanical soft food diet and continue with Ensure shakes. Patient has a lot of anxiety and fear with swallowing as a result of choking episodes. She has also seen dentist and had examination for dentures. She is severely malnourished secondary to her poor PO intake. She reports her son looks after her and she reports he has shown concern in her overall health. Patient was seen by palliative care and dietary. She was on a full liquid diet and pt consuming appx 100% of her meals at this time. Patient has no esophageal dysphagia or oropharyngeal dysphagia. She does not feel like she is choking on her food, she is just scared. No aspiration or coughing when swallowing. Per palliative care note today, patient reported to her she is not afraid of eating her diet and pt appears to be getting enough nutrients orally, that feeding tube is not necessary. Pt denies any intractable nausea, vomiting, odynophagia, early satiety, or epigastric abdominal pain.   Last  Colonoscopy:   [facility] (Dr. Allegra LaiVanga): 07/28/2019 - normal examined colon  Last Endoscopy:   EGD (Dr. Allegra LaiVanga) 11/11/2019 - Normal duodenal bulb and second portion of the duodenum. - Small hiatal hernia. - Normal stomach. - Non-obstructing and mild Schatzki ring. - Esophagogastric landmarks identified. Esophageal bx negative. - Normal esophagus. Biopsied.   Past Medical History:  Past Medical History:  Diagnosis Date  . Allergy   . Corn of toe   . Depression   . First degree AV block   . GERD (gastroesophageal reflux disease)   . Hammer toe of right foot   . Hyperglycemia   . Hyperlipidemia   . Hypertension   . Loss of appetite   . Prolapse of urethra   . Scoliosis (and kyphoscoliosis), idiopathic   . Thyroid disease   . Tinnitus of both ears   . Vitreous floaters of right eye     Problem List: Patient Active Problem List   Diagnosis Date Noted  . Protein-calorie malnutrition, severe 09/13/2020  . Esophageal motility disorder   . Palliative care by specialist   . Advanced care planning/counseling discussion   . Failure to thrive in adult 09/11/2020  . Avoidant-restrictive food intake disorder (ARFID) 08/30/2020  . Paroxysmal atrial fibrillation (HCC) 03/22/2020  . Vitamin D deficiency 03/01/2020  . Dysphagia   . Arrhythmia 08/25/2019  . Rectal bleeding   . Low grade squamous intraepith lesion on cytologic smear cervix (lgsil) 08/04/2018  . Chronic venous insufficiency 07/05/2018  . Lymphedema 07/05/2018  . Hypertension 04/02/2018  . Swelling of limb 04/02/2018  . Depression, major, recurrent, mild (HCC)  12/29/2017  . Leukopenia 05/24/2017  . Allergic rhinitis, seasonal 06/29/2015  . Clavus 06/29/2015  . 1st degree AV block 06/29/2015  . Hammer toe 06/29/2015  . H/O: HTN (hypertension) 06/29/2015  . Blood glucose elevated 06/29/2015  . Floater, vitreous 06/29/2015  . Bilateral tinnitus 06/29/2015  . Hypothyroidism (acquired) 04/02/2015  . Gastroesophageal reflux  disease 04/02/2015  . Hammer toe of right foot 04/02/2015  . Scoliosis of thoracic spine 04/02/2015  . Urethral prolapse 04/02/2015  . Corn of toe 04/02/2015  . Hyperlipidemia 04/02/2015  . Varicose veins of both lower extremities 04/02/2015    Past Surgical History: Past Surgical History:  Procedure Laterality Date  . ABDOMINAL HYSTERECTOMY  1989  . APPENDECTOMY  1989  . BREAST EXCISIONAL BIOPSY Bilateral    multiple biopsies negative  . BREAST SURGERY     Several  . COLONOSCOPY WITH PROPOFOL N/A 07/28/2019   Procedure: COLONOSCOPY WITH PROPOFOL;  Surgeon: Toney Reil, MD;  Location: [facility] ENDOSCOPY;  Service: Gastroenterology;  Laterality: N/A;  . ESOPHAGOGASTRODUODENOSCOPY (EGD) WITH PROPOFOL N/A 11/11/2019   Procedure: ESOPHAGOGASTRODUODENOSCOPY (EGD) WITH PROPOFOL;  Surgeon: Toney Reil, MD;  Location: [facility] ENDOSCOPY;  Service: Gastroenterology;  Laterality: N/A;  . TUMOR EXCISION  1989   same time as hysterectomy    Allergies: Allergies  Allergen Reactions  . Aspirin     Tinnitus  . Citalopram Other (See Comments)  . Codeine Nausea And Vomiting  . Penicillins Rash    Has patient had a PCN reaction causing immediate rash, facial/tongue/throat swelling, SOB or lightheadedness with hypotension: Yes Has patient had a PCN reaction causing severe rash involving mucus membranes or skin necrosis: No Has patient had a PCN reaction that required hospitalization No Has patient had a PCN reaction occurring within the last 10 years: No If all of the above answers are "NO", then may proceed with Cephalosporin use.    Home Medications: Medications Prior to Admission  Medication Sig Dispense Refill Last Dose  . clonazepam (KLONOPIN) 0.125 MG disintegrating tablet Take 1 tablet (0.125 mg total) by mouth 2 (two) times daily. (Patient taking differently: Take 0.125 mg by mouth daily. ) 60 tablet 0 Past Week at Unknown time  . levothyroxine (SYNTHROID) 88 MCG tablet Take 1  tablet (88 mcg total) by mouth daily before breakfast. 90 tablet 0 Past Week at Unknown time  . omeprazole (PRILOSEC) 40 MG capsule Take 1 capsule (40 mg total) by mouth daily before breakfast. 90 capsule 3 Past Week at Unknown time  . Ensure (ENSURE) Take 237 mLs by mouth. Pt drinking approx 3 ensures per day     . Iron-Vitamins (GERITOL COMPLETE PO) Take 1 tablet by mouth daily. (Patient not taking: Reported on 05/29/2020)     . mirtazapine (REMERON) 15 MG tablet 7.5 mg at night for two weeks, then 15 mg at night (Patient not taking: Reported on 05/29/2020) 30 tablet 1    Home medication reconciliation was completed with the patient.   Scheduled Inpatient Medications:   . clonazepam  0.125 mg Oral Daily  . enoxaparin (LOVENOX) injection  30 mg Subcutaneous Q24H  . feeding supplement  237 mL Oral TID BM  . levothyroxine  88 mcg Oral QAC breakfast  . mirtazapine  15 mg Oral QHS  . pantoprazole sodium  40 mg Oral BID  . Vitamin D (Ergocalciferol)  50,000 Units Oral Q7 days    Continuous Inpatient Infusions:    PRN Inpatient Medications:    Family History: family history includes Anuerysm (  age of onset: 97) in her son; Breast cancer in her cousin and maternal aunt; Breast cancer (age of onset: 51) in her sister; Depression in her mother and sister; Diabetes in her son; Diabetes (age of onset: 74) in her father; Heart failure in her mother; Hypertension (age of onset: 13) in her mother; Kidney disease in her mother and another family member; Thyroid disease in her mother.  The patient's family history is negative for inflammatory bowel disorders, GI malignancy, or solid organ transplantation.  Social History:   reports that she has never smoked. She has never used smokeless tobacco. She reports that she does not drink alcohol and does not use drugs. The patient denies ETOH, tobacco, or drug use.   Review of Systems: Constitutional: +weight loss Eyes: No changes in vision. ENT: No oral  lesions, sore throat.  GI: see HPI.  Heme/Lymph: No easy bruising.  CV: No chest pain.  GU: No hematuria.  Integumentary: No rashes.  Neuro: No headaches.  Psych: No depression/anxiety.  Endocrine: No heat/cold intolerance.  Allergic/Immunologic: No urticaria.  Resp: No cough, SOB.  Musculoskeletal: No joint swelling.    Physical Examination: BP 96/74 (BP Location: Right Arm)   Pulse 79   Temp 98.5 F (36.9 C) (Oral)   Resp 20   Ht 5\' 1"  (1.549 m)   Wt 41.4 kg   SpO2 100%   BMI 17.25 kg/m  Chronically ill-appearing female in hospital bed. Answers all questions. No family at bedside.  Gen: NAD, alert and oriented x 4 HEENT: PEERLA, EOMI, Neck: supple, no JVD or thyromegaly Chest: CTA bilaterally, no wheezes, crackles, or other adventitious sounds CV: RRR, no m/g/c/r Abd: soft, NT, ND, +BS in all four quadrants; no HSM, guarding, ridigity, or rebound tenderness Ext: no edema, well perfused with 2+ pulses, Skin: no rash or lesions noted Lymph: no LAD  Data: Lab Results  Component Value Date   WBC 2.3 (L) 09/14/2020   HGB 9.1 (L) 09/14/2020   HCT 26.0 (L) 09/14/2020   MCV 91.2 09/14/2020   PLT 139 (L) 09/14/2020   Recent Labs  Lab 09/11/20 1140 09/13/20 0540 09/14/20 0359  HGB 10.2* 9.8* 9.1*   Lab Results  Component Value Date   NA 133 (L) 09/14/2020   K 3.9 09/14/2020   CL 99 09/14/2020   CO2 29 09/14/2020   BUN 11 09/14/2020   CREATININE 0.51 09/14/2020   Lab Results  Component Value Date   ALT 31 09/11/2020   AST 41 09/11/2020   ALKPHOS 69 09/11/2020   BILITOT 0.7 09/11/2020   No results for input(s): APTT, INR, PTT in the last 168 hours. Assessment/Plan:  74 y/o AA female with a PMH of hypothyroidism, HTN, avoidant-restrictive food intake disorder, dysphagia 2/2 esophageal dysmotility and restrictive intake disorder, and progressive weight loss admitted for failure to thrive and abnormal labs  1. Failure to thrive in adult  2.  Avoidant-Restrictive Food Intake Disorder  3. Esophageal dysphagia - no evidence of luminal narrowing or structural cause of her dysphagia. Most likely psychogenic.   4. Leukopenia   5. Depression and anxiety  Recommendations:  -Discussed with patient at bedside the indications and contraindications to PEG tube placement -Appreciate palliative care and dietary consults and recommendations -Per dietary, patient is able to meet all of her nutritional needs with current full liquid diet -PEG is not indicated at this time and is not necessary  -GI will sign off at this time. If clinical scenario changes or there are  any questions, please ask [place] to sign back on.  -Discussed patient's plan of care with her son - Pricilla Holm who appreciated my call and is in agreement.   Thank you for the consult. Please call with questions or concerns.  Mickle Mallory [facility] Gastroenterology [phone] (Cell)

## 2020-09-14 NOTE — Evaluation (Signed)
Physical Therapy Evaluation Patient Details Name: Maureen [name] MRN: [id number] DOB: 10-20-1946 Today's Date: 09/14/2020   History of Present Illness  74 y.o. female with pertinent past medical history of chronic weight loss due to dysphagia from esophageal dysmotility and likely avoidant restrictive food intake disorder who presented to the ED on 09/11/20 on referral from primary care due to abnormal labs, progressive weakness and decreased PO intake.  Clinical Impression  Patient resting in bed upon arrival to room; agreeable to session with min encouragement from therapist.  Patient generally cachetic in appearance; globally weak and deconditioned throughout all extremities.  Denies pain at this time.  Currently requiring min assist for bed mobility; min assist for sit/stand, basic transfers and gait (50') with R HHA.  Demonstrates short, choppy steps; increased sway, decreased balance evident. Min cuing for postural extension.  Intermittent reaching for walls/furniture for external stabilization as needed.  Participated with additional gait training (25') with RW, cga/close sup-noted improvement in gait fluidity and overall stability, however, patient clearly expressing "I don't like it" and declines further use of RW.  "You can fold that thing up and put it back over there".  Does appear to benefit from use of assist device; will plan to trial [facility] in subsequent session. Would benefit from skilled PT to address above deficits and promote optimal return to PLOF.; recommend transition to STR upon discharge from acute hospitalization, as patient unable to demonstrate ability to safely/indep return home alone at this time.      Follow Up Recommendations SNF    Equipment Recommendations       Recommendations for Other Services       Precautions / Restrictions Precautions Precautions: Fall Restrictions Weight Bearing Restrictions: No      Mobility  Bed Mobility Overal bed mobility:  Needs Assistance Bed Mobility: Supine to Sit;Sit to Supine     Supine to sit: Supervision Sit to supine: Min assist   General bed mobility comments: requires assist to elevate bilat LEs over edge of bed with return to supine    Transfers Overall transfer level: Needs assistance Equipment used: 1 person hand held assist Transfers: Sit to/from Stand Sit to Stand: Min assist Stand pivot transfers: Supervision       General transfer comment: assist for lift off, standing balance; increased sway in all planes, limited balance reactions evident-does require use of UE support to optimize balance  Ambulation/Gait Ambulation/Gait assistance: Min assist Gait Distance (Feet): 50 Feet Assistive device: 1 person hand held assist       General Gait Details: short, choppy steps; increased sway, decreased balance evident. Min cuing for postural extension.  Intermittent reaching for walls/furniture for external stabilization as needed  Stairs            Wheelchair Mobility    Modified Rankin (Stroke Patients Only)       Balance Overall balance assessment: Needs assistance Sitting-balance support: No upper extremity supported;Feet supported Sitting balance-Leahy Scale: Good Sitting balance - Comments: no LOB   Standing balance support: Single extremity supported Standing balance-Leahy Scale: Poor                               Pertinent Vitals/Pain Pain Assessment: No/denies pain    Home Living Family/patient expects to be discharged to:: Private residence Living Arrangements: Alone Available Help at Discharge: Family;Available PRN/intermittently Type of Home: Apartment Home Access: Level entry     Home Layout: One level  Home Equipment: Gilmer Mor - single point      Prior Function Level of Independence: Independent         Comments: Mod indep for ADLs (sponge bathing), household mobilization wihtout assist device; intermittent use of SPC as needed.   Family assists with community errands/needs.  Denies fall history.     Hand Dominance   Dominant Hand: Right    Extremity/Trunk Assessment   Upper Extremity Assessment Upper Extremity Assessment: Generalized weakness    Lower Extremity Assessment Lower Extremity Assessment: Generalized weakness (grossly 4-/5 throughout)       Communication   Communication: No difficulties  Cognition Arousal/Alertness: Awake/alert Behavior During Therapy: WFL for tasks assessed/performed Overall Cognitive Status: Within Functional Limits for tasks assessed                                        General Comments      Exercises Other Exercises Other Exercises: 57' with RW, cga/close sup-noted improvement in gait fluidity and overall stability, however, patient clearly expressing "I don't like it" and declines further use of RW.  "You can fold that thing up and put it back over there".  Does appear to benefit from use of assist device; will plan to trial [facility] in subsequent session.   Assessment/Plan    PT Assessment Patient needs continued PT services  PT Problem List Decreased strength;Decreased activity tolerance;Decreased balance;Decreased mobility;Decreased knowledge of use of DME;Decreased safety awareness;Decreased knowledge of precautions       PT Treatment Interventions      PT Goals (Current goals can be found in the Care Plan section)  Acute Rehab PT Goals Patient Stated Goal: to get stronger PT Goal Formulation: With patient Time For Goal Achievement: 09/28/20 Potential to Achieve Goals: Fair    Frequency Min 2X/week   Barriers to discharge Decreased caregiver support      Co-evaluation               AM-PAC PT "6 Clicks" Mobility  Outcome Measure Help needed turning from your back to your side while in a flat bed without using bedrails?: None Help needed moving from lying on your back to sitting on the side of a flat bed without using bedrails?:  None Help needed moving to and from a bed to a chair (including a wheelchair)?: A Little Help needed standing up from a chair using your arms (e.g., wheelchair or bedside chair)?: A Little Help needed to walk in hospital room?: A Little Help needed climbing 3-5 steps with a railing? : A Little 6 Click Score: 20    End of Session Equipment Utilized During Treatment: Gait belt Activity Tolerance: Patient tolerated treatment well Patient left: in bed;with call bell/phone within reach;with bed alarm set Nurse Communication: Mobility status PT Visit Diagnosis: Muscle weakness (generalized) (M62.81);Difficulty in walking, not elsewhere classified (R26.2)    Time: 4132-4401 PT Time Calculation (min) (ACUTE ONLY): 18 min   Charges:   PT Evaluation $PT Eval Moderate Complexity: 1 Mod PT Treatments $Gait Training: 8-22 mins        Nattalie Santiesteban H. Manson Passey, PT, DPT, NCS 09/14/20, 3:04 PM [phone]

## 2020-09-14 NOTE — Progress Notes (Signed)
Attempted to admin lovenox and vitD tab to pt. Pt respectfully declines at this time despite education regarding purpose/ importance.  Will attempt again later.

## 2020-09-14 NOTE — Progress Notes (Signed)
Palliative-   HPI: 74 y.o. female  with past medical history of dysphagia, hypothyroid, weight loss, depression, a fib, admitted on 09/11/2020 due to being instructed by her MD to come to hospital because of low WBC. She has increasing weakness and poor po intake.   CT of chest showed complete collapse of RLL with occlusion of the RLL pulmonary bronchus suggesting endobrachial mass such as aspirated debris or mucous plug, as well as patulous esophagus with air fluid level reflecting possibly esophageal dysmotility. She was seen in March by GI at which time she was referred to [facility] for esophageal manometry- however, it does not appear she had this completed- I do not see further followup with GI.  Palliative medicine consulted in patient with failure to thrive.    Patient sitting up in bed, alert, oriented. Has eaten 100% of her lunch tray. Feels she has good appetite and is not afraid of eating her current diet. Discussed with dietician- on review of calorie count does not appear that feeding tube is necessary. Appears that diet interventions and recommendations have helped increase her intake. She is unlikely to make significant gains, but is taking in enough to sustain herself nutritionally.   No recommendations from Palliative at this time- will continue to follow for progress/decline.   Ocie Bob, AGNP-C Palliative Medicine  Please call Palliative Medicine team phone with any questions [phone]. For individual providers please see AMION.  Total time: 17 mins

## 2020-09-14 NOTE — Care Management Important Message (Signed)
Important Message  Patient Details  Name: Maureen [name] MRN: [id number] Date of Birth: 1946/08/25   Medicare Important Message Given:  Yes     Johnell Comings 09/14/2020, 11:09 AM

## 2020-09-14 NOTE — Evaluation (Signed)
Occupational Therapy Evaluation Patient Details Name: Maureen [name] MRN: [id number] DOB: 05-Mar-1946 Today's Date: 09/14/2020    History of Present Illness 74 y.o. female with pertinent past medical history of chronic weight loss due to dysphagia from esophageal dysmotility and likely avoidant restrictive food intake disorder who presented to the ED on 09/11/20 on referral from primary care due to abnormal labs drawn 4 days prior.  Patient denied any specific complaints at time of admission other than generalized weakness   Clinical Impression   Patient presenting with decreased I in self care, balance, functional mobility/transfer, endurance, and safety awareness. Patient reports living alone in an apartment PTA independently without [place] of AD. Pt does not drive and family assists with taking her to appointments and the store. Pt did report son would likely be able to assist at discharge as needed.  Patient currently functioning at supervision overall without use of AD for ambulation. Pt ambulating in room and standing at sink for grooming tasks, toileting needs, and making bed while standing all with close supervision for safety. Patient will benefit from acute OT to increase overall independence in the areas of ADLs, functional mobility, and safety awareness in order to safely discharge home with intermittent assist.    Follow Up Recommendations  Home health OT;Supervision - Intermittent    Equipment Recommendations  3 in 1 bedside commode    Recommendations for Other Services Other (comment) (none at this time)     Precautions / Restrictions Precautions Precautions: Fall      Mobility Bed Mobility Overal bed mobility: Modified Independent       General bed mobility comments: HOB elevated and increased time needed for task    Transfers Overall transfer level: Needs assistance Equipment used: None Transfers: Sit to/from [employer] Sit to Stand:  Supervision Stand pivot transfers: Supervision       General transfer comment: supervision overall for safety with min cuing for technique    Balance Overall balance assessment: Needs assistance Sitting-balance support: Feet supported Sitting balance-Leahy Scale: Good Sitting balance - Comments: no LOB     Standing balance-Leahy Scale: Fair          ADL either performed or assessed with clinical judgement   ADL Overall ADL's : Needs assistance/impaired Eating/Feeding: Modified independent   Grooming: Wash/dry hands;Wash/dry face;Standing;Supervision/safety    Toilet Transfer: Supervision/safety;BSC;Ambulation   Toileting- Architect and Hygiene: Supervision/safety;Sit to/from stand       Functional mobility during ADLs: Supervision/safety General ADL Comments: Pt ambulating and performing functional transfers/tasks with close supervision for safety without use of AD.     Vision Patient Visual Report: No change from baseline              Pertinent Vitals/Pain Pain Assessment: No/denies pain     Hand Dominance Right   Extremity/Trunk Assessment Upper Extremity Assessment Upper Extremity Assessment: Generalized weakness   Lower Extremity Assessment Lower Extremity Assessment: Generalized weakness       Communication Communication Communication: No difficulties   Cognition Arousal/Alertness: Awake/alert Behavior During Therapy: WFL for tasks assessed/performed Overall Cognitive Status: Within Functional Limits for tasks assessed                     Home Living Family/patient expects to be discharged to:: Private residence Living Arrangements: Alone Available Help at Discharge: Family;Available PRN/intermittently Type of Home: Apartment Home Access: Level entry     Home Layout: One level     Bathroom Shower/Tub: Tub/shower unit  Bathroom Toilet: Pharmacist, community: Yes   Home Equipment: Cane - single point           Prior Functioning/Environment Level of Independence: Independent        Comments: Pt reports living at home alone and utilizing sink bathing for safety. Family assists as needed. She does not drive and family takes her to appointments and the store.        OT Problem List: Decreased strength;Decreased activity tolerance;Decreased safety awareness;Impaired balance (sitting and/or standing);Decreased knowledge of use of DME or AE      OT Treatment/Interventions: Self-care/ADL training;Therapeutic exercise;Therapeutic activities;Energy conservation;DME and/or AE instruction;Patient/family education;Balance training    OT Goals(Current goals can be found in the care plan section) Acute Rehab OT Goals Patient Stated Goal: to go home and get better OT Goal Formulation: With patient Time For Goal Achievement: 09/28/20 Potential to Achieve Goals: Good ADL Goals Pt Will Perform Grooming: with modified independence;standing Pt Will Perform Lower Body Dressing: with modified independence;sit to/from stand Pt Will Transfer to Toilet: with modified independence;ambulating Pt Will Perform Toileting - Clothing Manipulation and hygiene: with modified independence;sit to/from stand  OT Frequency: Min 2X/week   Barriers to D/C:    none known at this time          AM-PAC OT "6 Clicks" Daily Activity     Outcome Measure Help from another person eating meals?: None Help from another person taking care of personal grooming?: None Help from another person toileting, which includes using toliet, bedpan, or urinal?: A Little Help from another person bathing (including washing, rinsing, drying)?: A Little Help from another person to put on and taking off regular upper body clothing?: None Help from another person to put on and taking off regular lower body clothing?: A Little 6 Click Score: 21   End of Session    Activity Tolerance: Patient tolerated treatment well Patient left: in  bed;with bed alarm set;with call bell/phone within reach  OT Visit Diagnosis: Unsteadiness on feet (R26.81);Muscle weakness (generalized) (M62.81)                Time: 1610-9604 OT Time Calculation (min): 43 min Charges:  OT General Charges $OT Visit: 1 Visit OT Evaluation $OT Eval Low Complexity: 1 Low OT Treatments $Self Care/Home Management : 38-52 mins  Jackquline Denmark, MS, OTR/L , CBIS ascom [phone]  09/14/20, 12:39 PM

## 2020-09-14 NOTE — Progress Notes (Addendum)
PROGRESS NOTE    [name]   IWO:[id number]  DOB: 02-Dec-1945  PCP: Steele Sizer, MD    DOA: 09/11/2020 LOS: 3   Brief Narrative   Maureen [name] is a 74 y.o. female with pertinent past medical history of chronic weight loss due to dysphagia from esophageal dysmotility and likely avoidant restrictive food intake disorder who presented to the ED on 09/11/20 on referral from primary care due to abnormal labs drawn 4 days prior.  Patient denied any specific complaints at time of admission other than generalized weakness.  Her son reported that patient has been progressively weak and had minimal PO intake aside from Ensure.     Assessment & Plan   Principal Problem:   Failure to thrive in adult Active Problems:   Hypothyroidism (acquired)   Hypertension   Dysphagia   Avoidant-restrictive food intake disorder (ARFID)   Protein-calorie malnutrition, severe   Esophageal motility disorder   Palliative care by specialist   Advanced care planning/counseling discussion   Failure to thrive in adult - pt has chronic and progressive weight loss and generalized weakness due to inadequate nutrition secondary to esophageal dysmotility with dysphagia and avoidant restrictive food intake disorder.  Pt with 1 year (or longer) history of dysphagia and resultant fear of swallowing.   11/18 - per patient tolerating full liquid diet okay.  Met with palliative care, agrees to PEG tube if needed.  No respiratory symptoms. 11/19 - good caloric intake yesterday without dysphagia.  No respiratory symptoms. Appears doing well on current dietary interventions.   --SLP consulted --Full liquid diet with supplements per RD --IV hydration --Dietician consulted --Palliative care consulted - continue full scope care --GI consulted for consideration of PEG tube placement --Continue Remeron, Klonopin PRN   Dysphagia due to esophageal dysmotility - continue IV PPI, Carafate.  Prior EGD showed  non-obstructing Schaztki's ring. Avoidant-Restrictive Food Intake Disorders - due to anxiety and fear with swallowing as result of dysphagia / choking episodes.  Imaging this admission supports recurrent / chronic dysphagia, showing "Complete collapse of the right lower lobe with occlusion of the a right lower lobar pulmonary bronchus suggesting and endobronchial mass such as aspirated debris or mucous plug.... Patulous esophagus demonstrating an air-fluid level. This may reflect changes of gastroesophageal reflux or esophageal dysmotility."   Hyponatremia - suspect hypovolemic given above history.  Improving.  On IV fluids as above.  Monitor BMP.     Vitamin D Deficiency - started on 50k units weekly until level >30.  PCP follow up.   Leukopenia - POA, chronic.  Referred to hematology by PCP.  On chart review, evaluation has been unremarkable.   Hx of Depression / Anxiety - Continue Remeron, PRN Klonopin   Hypothyroidism - TSH abnormal due to noncompliance with Synthroid.  Resume Synthroid at currently prescribed dose.  Follow up TSH in about 6 weeks.    Patient BMI: Body mass index is 17.25 kg/m.   DVT prophylaxis: heparin injection 5,000 Units Start: 09/11/20 2200 SCDs Start: 09/11/20 1510   Diet:  Diet Orders (From admission, onward)    Start     Ordered   09/11/20 1514  Diet full liquid Room service appropriate? Yes; Fluid consistency: Thin  Diet effective now       Question Answer Comment  Room service appropriate? Yes   Fluid consistency: Thin      09/11/20 1515            Code Status: DNR    Subjective  09/14/20    Pt seen at bedside this AM.  She says she didn't have any issues with dysphagia yesterday and was able to take in over 1600 calories.  Does report feeling weaker than her baseline.  No fever chills or cough.  No other acute complaints.   Disposition Plan & Communication   Status is: Inpatient  Remains inpatient appropriate because:Inpatient  level of care appropriate due to severity of illness with inadequate PO intake and ongoing evaluation not appropriate for outpatient setting.     Dispo: The patient is from: Home              Anticipated d/c is to: Home with home health              Anticipated d/c date is: 1 day              Patient currently is not medically stable to d/c.    Family Communication: son updated by phone this afternoon 11/19   Consults, Procedures, Significant Events   Consultants:   Palliative Care  Procedures:   None  Antimicrobials:  Anti-infectives (From admission, onward)   None         Objective   Vitals:   09/14/20 0500 09/14/20 0510 09/14/20 0722 09/14/20 1115  BP:  102/66 111/75 90/63  Pulse:  65 67 84  Resp:  16 16 18   Temp:  97.9 F (36.6 C) 97.7 F (36.5 C) 98.5 F (36.9 C)  TempSrc:  Oral Oral Oral  SpO2:  100% 100% 100%  Weight: 41.4 kg     Height:        Intake/Output Summary (Last 24 hours) at 09/14/2020 1438 Last data filed at 09/14/2020 0071 Gross per 24 hour  Intake 1120 ml  Output 1 ml  Net 1119 ml   Filed Weights   09/11/20 1112 09/14/20 0500  Weight: 38.6 kg 41.4 kg    Physical Exam:  General exam: awake, alert, no acute distress, cachectic, conversational Respiratory system: clear bilaterally without wheezes or rhonchi, normal respiratory effort, on room air. Cardiovascular system: normal S1/S2, RRR, no pedal edema.   Gastrointestinal system: sunken abdomen, NT, ND, +bowel sounds. Central nervous system: no gross focal neurologic deficits, normal speech Psychiatric: normal mood, congruent affect, judgment and insight appear normal  Labs   Data Reviewed: I have personally reviewed following labs and imaging studies  CBC: Recent Labs  Lab 09/11/20 1140 09/13/20 0540 09/14/20 0359  WBC 2.6* 2.6* 2.3*  NEUTROABS 1.1*  --   --   HGB 10.2* 9.8* 9.1*  HCT 30.0* 28.0* 26.0*  MCV 92.3 90.6 91.2  PLT 150 140* 219*   Basic Metabolic  Panel: Recent Labs  Lab 09/11/20 1140 09/13/20 0540 09/14/20 0359  NA 128* 131* 133*  K 3.9 4.0 3.9  CL 88* 95* 99  CO2 33* 27 29  GLUCOSE 79 71 77  BUN 11 8 11   CREATININE 0.57 0.44 0.51  CALCIUM 8.7* 8.4* 8.0*  MG 2.3 2.1 2.1  PHOS 3.3 3.6 3.2   GFR: Estimated Creatinine Clearance: 40.3 mL/min (by C-G formula based on SCr of 0.51 mg/dL). Liver Function Tests: Recent Labs  Lab 09/11/20 1140  AST 41  ALT 31  ALKPHOS 69  BILITOT 0.7  PROT 7.3  ALBUMIN 3.8   Recent Labs  Lab 09/11/20 1140  LIPASE 31   No results for input(s): AMMONIA in the last 168 hours. Coagulation Profile: No results for input(s): INR, PROTIME in the last 168  hours. Cardiac Enzymes: No results for input(s): CKTOTAL, CKMB, CKMBINDEX, TROPONINI in the last 168 hours. BNP (last 3 results) No results for input(s): PROBNP in the last 8760 hours. HbA1C: No results for input(s): HGBA1C in the last 72 hours. CBG: No results for input(s): GLUCAP in the last 168 hours. Lipid Profile: No results for input(s): CHOL, HDL, LDLCALC, TRIG, CHOLHDL, LDLDIRECT in the last 72 hours. Thyroid Function Tests: No results for input(s): TSH, T4TOTAL, FREET4, T3FREE, THYROIDAB in the last 72 hours. Anemia Panel: Recent Labs    09/13/20 0540  FERRITIN 300  TIBC 227*  IRON 53   Sepsis Labs: No results for input(s): PROCALCITON, LATICACIDVEN in the last 168 hours.  Recent Results (from the past 240 hour(s))  Respiratory Panel by RT PCR (Flu A&B, Covid) - Nasopharyngeal Swab     Status: None   Collection Time: 09/11/20  2:46 PM   Specimen: Nasopharyngeal Swab  Result Value Ref Range Status   SARS Coronavirus 2 by RT PCR NEGATIVE NEGATIVE Final    Comment: (NOTE) SARS-CoV-2 target nucleic acids are NOT DETECTED.  The SARS-CoV-2 RNA is generally detectable in upper respiratoy specimens during the acute phase of infection. The lowest concentration of SARS-CoV-2 viral copies this assay can detect is 131  copies/mL. A negative result does not preclude SARS-Cov-2 infection and should not be used as the sole basis for treatment or other patient management decisions. A negative result may occur with  improper specimen collection/handling, submission of specimen other than nasopharyngeal swab, presence of viral mutation(s) within the areas targeted by this assay, and inadequate number of viral copies (<131 copies/mL). A negative result must be combined with clinical observations, patient history, and epidemiological information. The expected result is Negative.  Fact Sheet for Patients:  PinkCheek.be  Fact Sheet for Healthcare Providers:  GravelBags.it  This test is no t yet approved or cleared by the [place] FDA and  has been authorized for detection and/or diagnosis of SARS-CoV-2 by FDA under an Emergency Use Authorization (EUA). This EUA will remain  in effect (meaning this test can be used) for the duration of the COVID-19 declaration under Section 564(b)(1) of the Act, 21 U.S.C. section 360bbb-3(b)(1), unless the authorization is terminated or revoked sooner.     Influenza A by PCR NEGATIVE NEGATIVE Final   Influenza B by PCR NEGATIVE NEGATIVE Final    Comment: (NOTE) The Xpert Xpress SARS-CoV-2/FLU/RSV assay is intended as an aid in  the diagnosis of influenza from Nasopharyngeal swab specimens and  should not be used as a sole basis for treatment. Nasal washings and  aspirates are unacceptable for Xpert Xpress SARS-CoV-2/FLU/RSV  testing.  Fact Sheet for Patients: PinkCheek.be  Fact Sheet for Healthcare Providers: GravelBags.it  This test is not yet approved or cleared by the [place] FDA and  has been authorized for detection and/or diagnosis of SARS-CoV-2 by  FDA under an Emergency Use Authorization (EUA). This EUA will remain  in effect (meaning  this test can be used) for the duration of the  Covid-19 declaration under Section 564(b)(1) of the Act, 21  U.S.C. section 360bbb-3(b)(1), unless the authorization is  terminated or revoked. Performed at [facility], [address] [place], [place] [address]       Imaging Studies   No results found.   Medications   Scheduled Meds: . clonazepam  0.125 mg Oral Daily  . feeding supplement  237 mL Oral TID BM  . heparin  5,000 Units Subcutaneous Q8H  .  levothyroxine  88 mcg Oral QAC breakfast  . mirtazapine  15 mg Oral QHS  . pantoprazole sodium  40 mg Oral BID   Continuous Infusions:      LOS: 3 days    Time spent: 25 minutes with > 50% spent in coordination of care and direct patient contact.    Ezekiel Slocumb, DO [facility]  09/14/2020, 2:38 PM    If 7PM-7AM, please contact night-coverage. How to contact the [facility] Attending or Consulting provider [place] or covering provider during after hours [place], for this patient?    1. Check the care team in [facility] and look for a) attending/consulting TRH provider listed and b) the [facility] team listed 2. Log into [email or web address] and use [place]'s universal password to access. If you do not have the password, please contact the hospital operator. 3. Locate the [facility] provider you are looking for under [facility] and page to a number that you can be directly reached. 4. If you still have difficulty reaching the provider, please page the [facility] (Director on Call) for the Hospitalists listed on amion for assistance.

## 2020-09-14 NOTE — Plan of Care (Signed)
Nutrition Education Note  RD provided diet education regarding full liquid diet.  RD provided "Full Liquid Nutrition Therapy" handout from the Academy of Nutrition and Dietetics. Encouraged patient to eat 4-6 small, frequent meals and snacks throughout the day. Reviewed full liquids and clear liquids patient can tolerate. Emphasized intake of ones that are high in calories and protein to help meet estimated needs. Encouraged patient to choose a good source of protein at each meal and snack. Encouraged intake of high-calorie, high-protein oral nutrition supplement 2-3 times daily. Encouraged intake of a complete multivitamin with minerals daily to meet micronutrient needs. Provided food lists and sample one-day menu.  Expect good compliance  Body mass index is 17.25 kg/m. Pt meets criteria for underweight based on current BMI.   Current diet order is full liquid, patient is consuming approximately 100% of meals at this time. Labs and medications reviewed. RD will continue to follow patient during hospitalization.  Felix Pacini, MS, RD, LDN Pager number available on Amion

## 2020-09-14 NOTE — TOC Initial Note (Signed)
Transition of Care [facility]) - Initial/Assessment Note    Patient Details  Name: Maureen [name] MRN: [id number] Date of Birth: [birth year]  Transition of Care [facility]) CM/SW Contact:    Allayne Butcher, RN Phone Number: 09/14/2020, 2:13 PM  Clinical Narrative:                 Patient from home alone admitted for failure to thrive due to dysphagia.  Palliative care established goals of care with patient and she would like to continue current care and even pursue feeding tube if necessary.  RNCM attempted to speak with patient but she was eating lunch at did not want to take at this time.   TOC will follow for needs.   Expected Discharge Plan: Home/Self Care Barriers to Discharge: Continued Medical Work up   Patient Goals and CMS Choice Patient states their goals for this hospitalization and ongoing recovery are:: did not voice goals      Expected Discharge Plan and Services Expected Discharge Plan: Home/Self Care       Living arrangements for the past 2 months: Single Family Home                           HH Arranged: NA          Prior Living Arrangements/Services Living arrangements for the past 2 months: Single Family Home Lives with:: Self Patient language and need for interpreter reviewed:: Yes Do you feel safe going back to the place where you live?: Yes      Need for Family Participation in Patient Care: Yes (Comment) (failure to thrive) Care giver support system in place?: Yes (comment) (son)   Criminal Activity/Legal Involvement Pertinent to Current Situation/Hospitalization: No - Comment as needed  Activities of Daily Living Home Assistive Devices/Equipment: None ADL Screening (condition at time of admission) Patient's cognitive ability adequate to safely complete daily activities?: Yes Is the patient deaf or have difficulty hearing?: No Does the patient have difficulty seeing, even when wearing glasses/contacts?: No Does the patient have difficulty  concentrating, remembering, or making decisions?: No Patient able to express need for assistance with ADLs?: Yes Does the patient have difficulty dressing or bathing?: No Independently performs ADLs?: Yes (appropriate for developmental age) Does the patient have difficulty walking or climbing stairs?: Yes (needs assistance) Weakness of Legs: Right Weakness of Arms/Hands: None  Permission Sought/Granted                  Emotional Assessment Appearance:: Appears older than stated age Attitude/Demeanor/Rapport: Self-Absorbed Affect (typically observed): Pleasant Orientation: : Oriented to Self, Oriented to Place, Oriented to  Time, Oriented to Situation Alcohol / Substance Use: Not Applicable Psych Involvement: No (comment)  Admission diagnosis:  Hyponatremia [E87.1] Weakness [R53.1] Failure to thrive in adult [R62.7] Dysphagia, unspecified type [R13.10] Patient Active Problem List   Diagnosis Date Noted  . Protein-calorie malnutrition, severe 09/13/2020  . Esophageal motility disorder   . Palliative care by specialist   . Advanced care planning/counseling discussion   . Failure to thrive in adult 09/11/2020  . Avoidant-restrictive food intake disorder (ARFID) 08/30/2020  . Paroxysmal atrial fibrillation (HCC) 03/22/2020  . Vitamin D deficiency 03/01/2020  . Dysphagia   . Arrhythmia 08/25/2019  . Rectal bleeding   . Low grade squamous intraepith lesion on cytologic smear cervix (lgsil) 08/04/2018  . Chronic venous insufficiency 07/05/2018  . Lymphedema 07/05/2018  . Hypertension 04/02/2018  . Swelling of limb 04/02/2018  .  Depression, major, recurrent, mild (HCC) 12/29/2017  . Leukopenia 05/24/2017  . Allergic rhinitis, seasonal 06/29/2015  . Clavus 06/29/2015  . 1st degree AV block 06/29/2015  . Hammer toe 06/29/2015  . H/O: HTN (hypertension) 06/29/2015  . Blood glucose elevated 06/29/2015  . Floater, vitreous 06/29/2015  . Bilateral tinnitus 06/29/2015  .  Hypothyroidism (acquired) 04/02/2015  . Gastroesophageal reflux disease 04/02/2015  . Hammer toe of right foot 04/02/2015  . Scoliosis of thoracic spine 04/02/2015  . Urethral prolapse 04/02/2015  . Corn of toe 04/02/2015  . Hyperlipidemia 04/02/2015  . Varicose veins of both lower extremities 04/02/2015   PCP:  Alba Cory, MD Pharmacy:   [facility] [address] [place] - [address] [place] [address] Phone: [phone] Fax: [phone]     Social Determinants of Health (SDOH) Interventions    Readmission Risk Interventions No flowsheet data found.

## 2020-09-14 NOTE — Progress Notes (Signed)
Calorie Count Note  48 hour calorie count ordered. Day 2 results below:  Diet: full liquid Supplements: Ensure Enlive po TID between meals (350 kcal and 20 grams of protein per serving); Magic Cup po BID with lunch and dinner (290 kcal and 9 grams of protein per serving)  Lunch 11/18: 598 kcal, 15 grams of protein (100% of cream of potato soup, vanilla ice cream, chicken broth, vanilla Magic Cup) Dinner 11/18: 716 kcal, 18 grams of protein (100% of cream of potato soup x2, vanilla ice cream, vanilla Magic Cup) Breakfast 11/18: 208 kcal, 3 grams of protein (100% beef broth, chicken broth, ice cream, coffee) Supplements: 700 kcal, 40 grams of protein (2 bottles of Ensure Enlive)  Total intake Day 2: 2222 kcal (>100% of estimated needs)  76 grams of protein (100% of estimated needs)  Estimated Nutritional Needs:  Kcal:  1400-1600 Protein:  70-80 grams Fluid:  1.2-1.5 L/day  Nutrition Dx: Severe Malnutrition related to chronic illness (dysphagia, anorexia) as evidenced by per patient/family report, severe fat depletion, severe muscle depletion, 34% weight loss over 1 year.  Goal: Patient will meet greater than or equal to 90% of their needs  Intervention: -48 hour calorie count now complete. -Continue Ensure Enlive po TID, each supplement provides 350 kcal and 20 grams of protein. -Continue Magic cup BID with lunch and dinner, each supplement provides 290 kcal and 9 grams of protein. -RD will provide diet education on full liquid diet and how patient can continue to meet calorie/protein needs (note to follow).  Felix Pacini, MS, RD, LDN Pager number available on Amion

## 2020-09-15 DIAGNOSIS — E039 Hypothyroidism, unspecified: Secondary | ICD-10-CM | POA: Diagnosis not present

## 2020-09-15 DIAGNOSIS — F5082 Avoidant/restrictive food intake disorder: Secondary | ICD-10-CM | POA: Diagnosis not present

## 2020-09-15 DIAGNOSIS — R627 Adult failure to thrive: Secondary | ICD-10-CM | POA: Diagnosis not present

## 2020-09-15 DIAGNOSIS — K224 Dyskinesia of esophagus: Secondary | ICD-10-CM | POA: Diagnosis not present

## 2020-09-15 LAB — CBC
HCT: 27.7 % — ABNORMAL LOW (ref 36.0–46.0)
Hemoglobin: 9.5 g/dL — ABNORMAL LOW (ref 12.0–15.0)
MCH: 31.4 pg (ref 26.0–34.0)
MCHC: 34.3 g/dL (ref 30.0–36.0)
MCV: 91.4 fL (ref 80.0–100.0)
Platelets: 147 10*3/uL — ABNORMAL LOW (ref 150–400)
RBC: 3.03 MIL/uL — ABNORMAL LOW (ref 3.87–5.11)
RDW: 14.6 % (ref 11.5–15.5)
WBC: 3.1 10*3/uL — ABNORMAL LOW (ref 4.0–10.5)
nRBC: 0 % (ref 0.0–0.2)

## 2020-09-15 LAB — MAGNESIUM: Magnesium: 2.2 mg/dL (ref 1.7–2.4)

## 2020-09-15 LAB — BASIC METABOLIC PANEL
Anion gap: 7 (ref 5–15)
BUN: 10 mg/dL (ref 8–23)
CO2: 29 mmol/L (ref 22–32)
Calcium: 8.5 mg/dL — ABNORMAL LOW (ref 8.9–10.3)
Chloride: 96 mmol/L — ABNORMAL LOW (ref 98–111)
Creatinine, Ser: 0.45 mg/dL (ref 0.44–1.00)
GFR, Estimated: >60 mL/min (ref >60)
Glucose, Bld: 78 mg/dL (ref 70–99)
Potassium: 3.9 mmol/L (ref 3.5–5.1)
Sodium: 132 mmol/L — ABNORMAL LOW (ref 135–145)

## 2020-09-15 LAB — FOLATE: Folate: 10.4 ng/mL (ref >5.9)

## 2020-09-15 LAB — PHOSPHORUS: Phosphorus: 3.2 mg/dL (ref 2.5–4.6)

## 2020-09-15 MED ORDER — INFLUENZA VAC A&B SA ADJ QUAD 0.5 ML IM PRSY
0.5000 mL | PREFILLED_SYRINGE | INTRAMUSCULAR | Status: DC
  Filled 2020-09-15: qty 0.5

## 2020-09-15 MED ORDER — PNEUMOCOCCAL VAC POLYVALENT 25 MCG/0.5ML IJ INJ: 0.5000 mL | INJECTION | INTRAMUSCULAR | Status: DC

## 2020-09-15 NOTE — Progress Notes (Signed)
PROGRESS NOTE    Maureen [name]   CXK:[id number]  DOB: 1946/07/24  PCP: Steele Sizer, MD    DOA: 09/11/2020 LOS: 4   Brief Narrative   Maureen [name] is a 74 y.o. female with pertinent past medical history of chronic weight loss due to dysphagia from esophageal dysmotility and likely avoidant restrictive food intake disorder who presented to the ED on 09/11/20 on referral from primary care due to abnormal labs drawn 4 days prior.  Patient denied any specific complaints at time of admission other than generalized weakness.  Her son reported that patient has been progressively weak and had minimal PO intake aside from Ensure.     Assessment & Plan   Principal Problem:   Failure to thrive in adult Active Problems:   Hypothyroidism (acquired)   Hypertension   Dysphagia   Avoidant-restrictive food intake disorder (ARFID)   Protein-calorie malnutrition, severe   Esophageal motility disorder   Palliative care by specialist   Advanced care planning/counseling discussion   Failure to thrive in adult - pt has chronic and progressive weight loss and generalized weakness due to inadequate nutrition secondary to esophageal dysmotility with dysphagia and avoidant restrictive food intake disorder.  Pt with 1 year (or longer) history of dysphagia and resultant fear of swallowing.   11/18 - per patient tolerating full liquid diet okay.  Met with palliative care, agrees to PEG tube if needed.  No respiratory symptoms. 11/19 - good caloric intake yesterday without dysphagia.  No respiratory symptoms. 11/20 - adequate calories on FLD since admission, no PEG indicated for now Patient is doing very well on current dietary interventions.   --SLP consulted --Full liquid diet with supplements per RD --Dietician consulted --Palliative care consulted - continue full scope care --GI consulted for consideration of PEG tube placement, not indicated at this time.  Follow up if needed as  outpatient. --Continue Remeron, Klonopin PRN   Dysphagia due to esophageal dysmotility - continue IV PPI, Carafate.  Prior EGD showed non-obstructing Schaztki's ring. Avoidant-Restrictive Food Intake Disorders - due to anxiety and fear with swallowing as result of dysphagia / choking episodes.  Imaging this admission supports recurrent / chronic dysphagia, showing "Complete collapse of the right lower lobe with occlusion of the a right lower lobar pulmonary bronchus suggesting and endobronchial mass such as aspirated debris or mucous plug.... Patulous esophagus demonstrating an air-fluid level. This may reflect changes of gastroesophageal reflux or esophageal dysmotility."  Generalized weakness - PT recommends SNF for rehab, pt agreeable.  TOC consulted for placement.   Hyponatremia - suspect hypovolemic given above history.  Improving.  On IV fluids as above.  Monitor BMP.     Vitamin D Deficiency - started on 50k units weekly until level >30.  PCP follow up.   Leukopenia - POA, chronic.  Referred to hematology by PCP.  On chart review, evaluation has been unremarkable.   Hx of Depression / Anxiety - Continue Remeron, PRN Klonopin   Hypothyroidism - TSH abnormal due to noncompliance with Synthroid.  Resume Synthroid at currently prescribed dose.  Follow up TSH in about 6 weeks.    Patient BMI: Body mass index is 16.83 kg/m.   DVT prophylaxis: enoxaparin (LOVENOX) injection 30 mg Start: 09/14/20 1600 SCDs Start: 09/11/20 1510   Diet:  Diet Orders (From admission, onward)    Start     Ordered   09/11/20 1514  Diet full liquid Room service appropriate? Yes; Fluid consistency: Thin  Diet effective now  Question Answer Comment  Room service appropriate? Yes   Fluid consistency: Thin      09/11/20 1515            Code Status: DNR    Subjective 09/15/20    Pt seen at bedside this AM just as breakfast was dropped off.  She reports feeling very well.  Denies any  problems with  Her swallow past few days.  Happy to hear she is taking in plenty of calories on current diet.  She does want to go to rehab as PT recommended.  She politely asks if I can come back so she can eat her breakfast.   Disposition Plan & Communication   Status is: Inpatient  Remains inpatient appropriate because: pt lives alone, generally weak and requires placement for rehab.     Dispo: The patient is from: Home              Anticipated d/c is to: SNF              Anticipated d/c date is: pending SNF bed              Patient currently IS medically stable for discharge    Family Communication: son updated by phone 11/19, will attempt call this afternoon.   Consults, Procedures, Significant Events   Consultants:   Palliative Care  Procedures:   None  Antimicrobials:  Anti-infectives (From admission, onward)   None         Objective   Vitals:   09/15/20 0500 09/15/20 0510 09/15/20 0804 09/15/20 1256  BP:  98/67 105/69 94/61  Pulse:  71 75 82  Resp:  16 20 18   Temp:  98.1 F (36.7 C) 98.6 F (37 C) 99.3 F (37.4 C)  TempSrc:  Oral Oral Oral  SpO2:  100% 99% 100%  Weight: 40.4 kg     Height:        Intake/Output Summary (Last 24 hours) at 09/15/2020 1547 Last data filed at 09/15/2020 1500 Gross per 24 hour  Intake --  Output 2 ml  Net -2 ml   Filed Weights   09/11/20 1112 09/14/20 0500 09/15/20 0500  Weight: 38.6 kg 41.4 kg 40.4 kg    Physical Exam:  General exam: awake, alert, no acute distress, cachectic and frail Respiratory system: CTAB, on room air. Cardiovascular system: normal S1/S2, no edema, regular RR Gastrointestinal system: sunken abdomen, NT, ND, +bowel sounds. Central nervous system: CN II-XII intact, normal speech    Labs   Data Reviewed: I have personally reviewed following labs and imaging studies  CBC: Recent Labs  Lab 09/11/20 1140 09/13/20 0540 09/14/20 0359 09/15/20 0409  WBC 2.6* 2.6* 2.3* 3.1*   NEUTROABS 1.1*  --   --   --   HGB 10.2* 9.8* 9.1* 9.5*  HCT 30.0* 28.0* 26.0* 27.7*  MCV 92.3 90.6 91.2 91.4  PLT 150 140* 139* 505*   Basic Metabolic Panel: Recent Labs  Lab 09/11/20 1140 09/13/20 0540 09/14/20 0359 09/15/20 0409  NA 128* 131* 133* 132*  K 3.9 4.0 3.9 3.9  CL 88* 95* 99 96*  CO2 33* 27 29 29   GLUCOSE 79 71 77 78  BUN 11 8 11 10   CREATININE 0.57 0.44 0.51 0.45  CALCIUM 8.7* 8.4* 8.0* 8.5*  MG 2.3 2.1 2.1 2.2  PHOS 3.3 3.6 3.2 3.2   GFR: Estimated Creatinine Clearance: 39.3 mL/min (by C-G formula based on SCr of 0.45 mg/dL). Liver Function Tests: Recent Labs  Lab 09/11/20 1140  AST 41  ALT 31  ALKPHOS 69  BILITOT 0.7  PROT 7.3  ALBUMIN 3.8   Recent Labs  Lab 09/11/20 1140  LIPASE 31   No results for input(s): AMMONIA in the last 168 hours. Coagulation Profile: No results for input(s): INR, PROTIME in the last 168 hours. Cardiac Enzymes: No results for input(s): CKTOTAL, CKMB, CKMBINDEX, TROPONINI in the last 168 hours. BNP (last 3 results) No results for input(s): PROBNP in the last 8760 hours. HbA1C: No results for input(s): HGBA1C in the last 72 hours. CBG: No results for input(s): GLUCAP in the last 168 hours. Lipid Profile: No results for input(s): CHOL, HDL, LDLCALC, TRIG, CHOLHDL, LDLDIRECT in the last 72 hours. Thyroid Function Tests: No results for input(s): TSH, T4TOTAL, FREET4, T3FREE, THYROIDAB in the last 72 hours. Anemia Panel: Recent Labs    09/13/20 0540 09/15/20 0409  FOLATE  --  10.4  FERRITIN 300  --   TIBC 227*  --   IRON 53  --    Sepsis Labs: No results for input(s): PROCALCITON, LATICACIDVEN in the last 168 hours.  Recent Results (from the past 240 hour(s))  Respiratory Panel by RT PCR (Flu A&B, Covid) - Nasopharyngeal Swab     Status: None   Collection Time: 09/11/20  2:46 PM   Specimen: Nasopharyngeal Swab  Result Value Ref Range Status   SARS Coronavirus 2 by RT PCR NEGATIVE NEGATIVE Final     Comment: (NOTE) SARS-CoV-2 target nucleic acids are NOT DETECTED.  The SARS-CoV-2 RNA is generally detectable in upper respiratoy specimens during the acute phase of infection. The lowest concentration of SARS-CoV-2 viral copies this assay can detect is 131 copies/mL. A negative result does not preclude SARS-Cov-2 infection and should not be used as the sole basis for treatment or other patient management decisions. A negative result may occur with  improper specimen collection/handling, submission of specimen other than nasopharyngeal swab, presence of viral mutation(s) within the areas targeted by this assay, and inadequate number of viral copies (<131 copies/mL). A negative result must be combined with clinical observations, patient history, and epidemiological information. The expected result is Negative.  Fact Sheet for Patients:  PinkCheek.be  Fact Sheet for Healthcare Providers:  GravelBags.it  This test is no t yet approved or cleared by the [place] FDA and  has been authorized for detection and/or diagnosis of SARS-CoV-2 by FDA under an Emergency Use Authorization (EUA). This EUA will remain  in effect (meaning this test can be used) for the duration of the COVID-19 declaration under Section 564(b)(1) of the Act, 21 U.S.C. section 360bbb-3(b)(1), unless the authorization is terminated or revoked sooner.     Influenza A by PCR NEGATIVE NEGATIVE Final   Influenza B by PCR NEGATIVE NEGATIVE Final    Comment: (NOTE) The Xpert Xpress SARS-CoV-2/FLU/RSV assay is intended as an aid in  the diagnosis of influenza from Nasopharyngeal swab specimens and  should not be used as a sole basis for treatment. Nasal washings and  aspirates are unacceptable for Xpert Xpress SARS-CoV-2/FLU/RSV  testing.  Fact Sheet for Patients: PinkCheek.be  Fact Sheet for Healthcare  Providers: GravelBags.it  This test is not yet approved or cleared by the [place] FDA and  has been authorized for detection and/or diagnosis of SARS-CoV-2 by  FDA under an Emergency Use Authorization (EUA). This EUA will remain  in effect (meaning this test can be used) for the duration of the  Covid-19 declaration under Section 564(b)(1)  of the Act, 21  U.S.C. section 360bbb-3(b)(1), unless the authorization is  terminated or revoked. Performed at [facility], [address], [place], [place] [address]       Imaging Studies   No results found.   Medications   Scheduled Meds: . clonazepam  0.125 mg Oral Daily  . enoxaparin (LOVENOX) injection  30 mg Subcutaneous Q24H  . feeding supplement  237 mL Oral TID BM  . [START ON 09/16/2020] influenza vaccine adjuvanted  0.5 mL Intramuscular Tomorrow-1000  . levothyroxine  88 mcg Oral QAC breakfast  . mirtazapine  15 mg Oral QHS  . pantoprazole sodium  40 mg Oral BID  . [START ON 09/16/2020] pneumococcal 23 valent vaccine  0.5 mL Intramuscular Tomorrow-1000  . Vitamin D (Ergocalciferol)  50,000 Units Oral Q7 days   Continuous Infusions:      LOS: 4 days    Time spent: 25 minutes with > 50% spent in coordination of care and direct patient contact.    Ezekiel Slocumb, DO [facility]  09/15/2020, 3:47 PM    If 7PM-7AM, please contact night-coverage. How to contact the [facility] Attending or Consulting provider [place] or covering provider during after hours [place], for this patient?    1. Check the care team in [facility] and look for a) attending/consulting [facility] provider listed and b) the [facility] team listed 2. Log into [email or web address] and use [place]'s universal password to access. If you do not have the password, please contact the hospital operator. 3. Locate the [facility] provider you are looking for under [facility] and page to a number that you can be directly reached. 4. If  you still have difficulty reaching the provider, please page the [facility] (Director on Call) for the Hospitalists listed on amion for assistance.

## 2020-09-16 DIAGNOSIS — K224 Dyskinesia of esophagus: Secondary | ICD-10-CM | POA: Diagnosis not present

## 2020-09-16 DIAGNOSIS — F5082 Avoidant/restrictive food intake disorder: Secondary | ICD-10-CM | POA: Diagnosis not present

## 2020-09-16 DIAGNOSIS — R627 Adult failure to thrive: Secondary | ICD-10-CM | POA: Diagnosis not present

## 2020-09-16 DIAGNOSIS — E039 Hypothyroidism, unspecified: Secondary | ICD-10-CM | POA: Diagnosis not present

## 2020-09-16 LAB — OSMOLALITY: Osmolality: 277 mOsm/kg (ref 275–295)

## 2020-09-16 LAB — CBC
HCT: 26.5 % — ABNORMAL LOW (ref 36.0–46.0)
Hemoglobin: 9.2 g/dL — ABNORMAL LOW (ref 12.0–15.0)
MCH: 31.7 pg (ref 26.0–34.0)
MCHC: 34.7 g/dL (ref 30.0–36.0)
MCV: 91.4 fL (ref 80.0–100.0)
Platelets: 158 10*3/uL (ref 150–400)
RBC: 2.9 MIL/uL — ABNORMAL LOW (ref 3.87–5.11)
RDW: 14.4 % (ref 11.5–15.5)
WBC: 3.1 10*3/uL — ABNORMAL LOW (ref 4.0–10.5)
nRBC: 0 % (ref 0.0–0.2)

## 2020-09-16 LAB — BASIC METABOLIC PANEL
Anion gap: 4 — ABNORMAL LOW (ref 5–15)
BUN: 11 mg/dL (ref 8–23)
CO2: 29 mmol/L (ref 22–32)
Calcium: 8.3 mg/dL — ABNORMAL LOW (ref 8.9–10.3)
Chloride: 97 mmol/L — ABNORMAL LOW (ref 98–111)
Creatinine, Ser: 0.46 mg/dL (ref 0.44–1.00)
GFR, Estimated: >60 mL/min (ref >60)
Glucose, Bld: 84 mg/dL (ref 70–99)
Potassium: 4 mmol/L (ref 3.5–5.1)
Sodium: 130 mmol/L — ABNORMAL LOW (ref 135–145)

## 2020-09-16 LAB — NA AND K (SODIUM & POTASSIUM), RAND UR
Potassium Urine: 38 mmol/L
Sodium, Ur: 54 mmol/L

## 2020-09-16 LAB — OSMOLALITY, URINE: Osmolality, Ur: 245 mOsm/kg — ABNORMAL LOW (ref 300–900)

## 2020-09-16 LAB — PHOSPHORUS: Phosphorus: 3.6 mg/dL (ref 2.5–4.6)

## 2020-09-16 MED ORDER — SODIUM CHLORIDE 0.9 % IV SOLN: INTRAVENOUS | Status: AC

## 2020-09-16 NOTE — Progress Notes (Addendum)
PROGRESS NOTE    Maureen [name]   RVU:[id number]  DOB: 12/03/1945  PCP: Steele Sizer, MD    DOA: 09/11/2020 LOS: 5   Brief Narrative   Maureen [name] is a 74 y.o. female with pertinent past medical history of chronic weight loss due to dysphagia from esophageal dysmotility and likely avoidant restrictive food intake disorder who presented to the ED on 09/11/20 on referral from primary care due to abnormal labs drawn 4 days prior.  Patient denied any specific complaints at time of admission other than generalized weakness.  Her son reported that patient has been progressively weak and had minimal PO intake aside from Ensure.     Assessment & Plan   Principal Problem:   Failure to thrive in adult Active Problems:   Hypothyroidism (acquired)   Hypertension   Dysphagia   Avoidant-restrictive food intake disorder (ARFID)   Protein-calorie malnutrition, severe   Esophageal motility disorder   Palliative care by specialist   Advanced care planning/counseling discussion   Failure to thrive in adult - pt has chronic and progressive weight loss and generalized weakness due to inadequate nutrition secondary to esophageal dysmotility with dysphagia and avoidant restrictive food intake disorder.  Pt with 1 year (or longer) history of dysphagia and resultant fear of swallowing.   11/18 - per patient tolerating full liquid diet okay.  Met with palliative care, agrees to PEG tube if needed.  No respiratory symptoms. 11/19 - good caloric intake yesterday without dysphagia.  No respiratory symptoms. 11/20 - adequate calories on FLD since admission, no PEG indicated for now Patient is doing very well on current dietary interventions.   --SLP consulted --Full liquid diet with supplements per RD --Dietician consulted --Palliative care consulted - continue full scope care --GI consulted for consideration of PEG tube placement, not indicated at this time.  Follow up if needed as  outpatient. --Continue Remeron, Klonopin PRN   Dysphagia due to esophageal dysmotility - continue IV PPI, Carafate.  Prior EGD showed non-obstructing Schaztki's ring. Avoidant-Restrictive Food Intake Disorders - due to anxiety and fear with swallowing as result of dysphagia / choking episodes.  Imaging this admission supports recurrent / chronic dysphagia, showing "Complete collapse of the right lower lobe with occlusion of the a right lower lobar pulmonary bronchus suggesting and endobronchial mass such as aspirated debris or mucous plug.... Patulous esophagus demonstrating an air-fluid level. This may reflect changes of gastroesophageal reflux or esophageal dysmotility."  Generalized weakness - PT recommends SNF for rehab, pt agreeable.  TOC consulted for placement.   Hyponatremia - suspect hypovolemic given above history.  Improved with IV fluids but has slowly trended down since off IV fluids.  Appears euvolemic, hypotonic etiology with differential of hypothyroidism, adrenal insufficiency, SIADH, meds.  Hypothyroidism seems most likely as TSH was quite high due to noncompliance with med at home. --Monitor BMP.   --Check cortisol and uric acid with AM labs.   Vitamin D Deficiency - started on 50k units weekly until level >30.  PCP follow up.   Leukopenia - POA, chronic.  Referred to hematology by PCP.  On chart review, evaluation has been unremarkable.   Hx of Depression / Anxiety - Continue Remeron, PRN Klonopin   Hypothyroidism - TSH abnormal due to noncompliance with Synthroid.  Resume Synthroid at currently prescribed dose.  Follow up TSH in about 6 weeks.    Normocytic anemia - Hbg slowly trending down from 10.8>>9.2 today.  No signs of bleeding.  Iron levels normal except  slightly low TIBC, folate and B12 are normal.  Check FOBT.  Suspect anemia of chronic disease.   Patient BMI: Body mass index is 17.04 kg/m.   DVT prophylaxis: enoxaparin (LOVENOX) injection 30 mg  Start: 09/14/20 1600 SCDs Start: 09/11/20 1510   Diet:  Diet Orders (From admission, onward)    Start     Ordered   09/11/20 1514  Diet full liquid Room service appropriate? Yes; Fluid consistency: Thin  Diet effective now       Question Answer Comment  Room service appropriate? Yes   Fluid consistency: Thin      09/11/20 1515            Code Status: DNR    Subjective 09/16/20    Pt seen at bedside today.  Reports she feels well and not having any issues with her swallowing.  Good appetite.  Ready to have some ice cream.  No acute complaints.  Remains agreeable to rehab.  Disposition Plan & Communication   Status is: Inpatient  Remains inpatient appropriate because: pt lives alone, generally weak and requires placement for rehab.     Dispo: The patient is from: Home              Anticipated d/c is to: SNF              Anticipated d/c date is: pending SNF bed              Patient currently IS medically stable for discharge    Family Communication: son updated by phone 11/19, will attempt call this afternoon.   Consults, Procedures, Significant Events   Consultants:   Palliative Care  Procedures:   None  Antimicrobials:  Anti-infectives (From admission, onward)   None         Objective   Vitals:   09/16/20 0500 09/16/20 0503 09/16/20 0725 09/16/20 1122  BP:  91/60 103/70 (!) 89/52  Pulse:  74 74 86  Resp:  _0 Temp:  98.1 F (36.7 C) 98.3 F (36.8 C) 98.5 F (36.9 C)  TempSrc:  Oral Oral Oral  SpO2:  100% 100% 100%  Weight: 40.9 kg     Height:        Intake/Output Summary (Last 24 hours) at 09/16/2020 1257 Last data filed at 09/16/2020 0734 Gross per 24 hour  Intake --  Output 202 ml  Net -202 ml   Filed Weights   09/14/20 0500 09/15/20 0500 09/16/20 0500  Weight: 41.4 kg 40.4 kg 40.9 kg    Physical Exam:  General exam: awake, alert, no acute distress, cachectic and frail, eating ice cream Respiratory system: CTAB, on room  air. Cardiovascular system: normal S1/S2, no edema, regular RR Gastrointestinal system: sunken abdomen, soft NT, non-distended Central nervous system: CN II-XII intact, normal speech    Labs   Data Reviewed: I have personally reviewed following labs and imaging studies  CBC: Recent Labs  Lab 09/11/20 1140 09/13/20 0540 09/14/20 0359 09/15/20 0409 09/16/20 0404  WBC 2.6* 2.6* 2.3* 3.1* 3.1*  NEUTROABS 1.1*  --   --   --   --   HGB 10.2* 9.8* 9.1* 9.5* 9.2*  HCT 30.0* 28.0* 26.0* 27.7* 26.5*  MCV 92.3 90.6 91.2 91.4 91.4  PLT 150 140* 139* 147* 175   Basic Metabolic Panel: Recent Labs  Lab 09/11/20 1140 09/13/20 0540 09/14/20 0359 09/15/20 0409 09/16/20 0404  NA 128* 131* 133* 132* 130*  K 3.9 4.0 3.9 3.9 4.0  CL 88* 95* 99 96* 97*  CO2 33* _0 GLUCOSE 79 71 77 78 84  BUN _1 CREATININE 0.57 0.44 0.51 0.45 0.46  CALCIUM 8.7* 8.4* 8.0* 8.5* 8.3*  MG 2.3 2.1 2.1 2.2  --   PHOS 3.3 3.6 3.2 3.2 3.6   GFR: Estimated Creatinine Clearance: 39.8 mL/min (by C-G formula based on SCr of 0.46 mg/dL). Liver Function Tests: Recent Labs  Lab 09/11/20 1140  AST 41  ALT 31  ALKPHOS 69  BILITOT 0.7  PROT 7.3  ALBUMIN 3.8   Recent Labs  Lab 09/11/20 1140  LIPASE 31   No results for input(s): AMMONIA in the last 168 hours. Coagulation Profile: No results for input(s): INR, PROTIME in the last 168 hours. Cardiac Enzymes: No results for input(s): CKTOTAL, CKMB, CKMBINDEX, TROPONINI in the last 168 hours. BNP (last 3 results) No results for input(s): PROBNP in the last 8760 hours. HbA1C: No results for input(s): HGBA1C in the last 72 hours. CBG: No results for input(s): GLUCAP in the last 168 hours. Lipid Profile: No results for input(s): CHOL, HDL, LDLCALC, TRIG, CHOLHDL, LDLDIRECT in the last 72 hours. Thyroid Function Tests: No results for input(s): TSH, T4TOTAL, FREET4, T3FREE, THYROIDAB in the last 72 hours. Anemia Panel: Recent Labs     09/15/20 0409  FOLATE 10.4   Sepsis Labs: No results for input(s): PROCALCITON, LATICACIDVEN in the last 168 hours.  Recent Results (from the past 240 hour(s))  Respiratory Panel by RT PCR (Flu A&B, Covid) - Nasopharyngeal Swab     Status: None   Collection Time: 09/11/20  2:46 PM   Specimen: Nasopharyngeal Swab  Result Value Ref Range Status   SARS Coronavirus 2 by RT PCR NEGATIVE NEGATIVE Final    Comment: (NOTE) SARS-CoV-2 target nucleic acids are NOT DETECTED.  The SARS-CoV-2 RNA is generally detectable in upper respiratoy specimens during the acute phase of infection. The lowest concentration of SARS-CoV-2 viral copies this assay can detect is 131 copies/mL. A negative result does not preclude SARS-Cov-2 infection and should not be used as the sole basis for treatment or other patient management decisions. A negative result may occur with  improper specimen collection/handling, submission of specimen other than nasopharyngeal swab, presence of viral mutation(s) within the areas targeted by this assay, and inadequate number of viral copies (<131 copies/mL). A negative result must be combined with clinical observations, patient history, and epidemiological information. The expected result is Negative.  Fact Sheet for Patients:  PinkCheek.be  Fact Sheet for Healthcare Providers:  GravelBags.it  This test is no t yet approved or cleared by the [place] FDA and  has been authorized for detection and/or diagnosis of SARS-CoV-2 by FDA under an Emergency Use Authorization (EUA). This EUA will remain  in effect (meaning this test can be used) for the duration of the COVID-19 declaration under Section 564(b)(1) of the Act, 21 U.S.C. section 360bbb-3(b)(1), unless the authorization is terminated or revoked sooner.     Influenza A by PCR NEGATIVE NEGATIVE Final   Influenza B by PCR NEGATIVE NEGATIVE Final    Comment:  (NOTE) The Xpert Xpress SARS-CoV-2/FLU/RSV assay is intended as an aid in  the diagnosis of influenza from Nasopharyngeal swab specimens and  should not be used as a sole basis for treatment. Nasal washings and  aspirates are unacceptable for Xpert Xpress SARS-CoV-2/FLU/RSV  testing.  Fact Sheet for Patients: PinkCheek.be  Fact Sheet for Healthcare Providers: GravelBags.it  This test is not yet approved or cleared by the [place] and  has been authorized for detection and/or diagnosis of SARS-CoV-2 by  FDA under an Emergency Use Authorization (EUA). This EUA will remain  in effect (meaning this test can be used) for the duration of the  Covid-19 declaration under Section 564(b)(1) of the Act, 21  U.S.C. section 360bbb-3(b)(1), unless the authorization is  terminated or revoked. Performed at [facility], [address] [place], [place] [address]       Imaging Studies   No results found.   Medications   Scheduled Meds: . clonazepam  0.125 mg Oral Daily  . enoxaparin (LOVENOX) injection  30 mg Subcutaneous Q24H  . feeding supplement  237 mL Oral TID BM  . influenza vaccine adjuvanted  0.5 mL Intramuscular Tomorrow-1000  . levothyroxine  88 mcg Oral QAC breakfast  . mirtazapine  15 mg Oral QHS  . pantoprazole sodium  40 mg Oral BID  . pneumococcal 23 valent vaccine  0.5 mL Intramuscular Tomorrow-1000  . Vitamin D (Ergocalciferol)  50,000 Units Oral Q7 days   Continuous Infusions:      LOS: 5 days    Time spent: 25 minutes with > 50% spent in coordination of care and direct patient contact.    Ezekiel Slocumb, DO [facility]  09/16/2020, 12:57 PM    If 7PM-7AM, please contact night-coverage. How to contact the [facility] Attending or Consulting provider [place] or covering provider during after hours [place], for this patient?    1. Check the care team in [facility] and look for a)  attending/consulting TRH provider listed and b) the [facility] team listed 2. Log into [email or web address] and use [place]'s universal password to access. If you do not have the password, please contact the hospital operator. 3. Locate the [facility] provider you are looking for under [facility] and page to a number that you can be directly reached. 4. If you still have difficulty reaching the provider, please page the [facility] (Director on Call) for the Hospitalists listed on amion for assistance.

## 2020-09-16 NOTE — TOC Initial Note (Signed)
Transition of Care [facility]) - Initial/Assessment Note    Patient Details  Name: Maureen [name] MRN: [id number] Date of Birth: [birth year]  Transition of Care [facility] CM/SW Contact:    Dominic Pea, LCSW Phone Number: 09/16/2020, 10:56 AM  Clinical Narrative:                 Patient from home alone admitted for failure to thrive due to dysphagia.  Palliative care established goals of care with patient and she would like to continue current care and even pursue feeding tube if necessary, per TOC notes. Per patient, son is assisting with researching SNF facilities. P/T recommending SNF. Patient in agreement. Preference is [facility]. Pasrr completed, FL-2 sent for signature, bed search initiated. TOC team continuing to follow for discharge needs.   Expected Discharge Plan: Skilled Nursing Facility Barriers to Discharge: Continued Medical Work up   Patient Goals and CMS Choice Patient states their goals for this hospitalization and ongoing recovery are:: "To try to get stronger." CMS Medicare.gov Compare Post Acute Care list provided to:: Patient Choice offered to / list presented to : Patient  Expected Discharge Plan and Services Expected Discharge Plan: Skilled Nursing Facility In-house Referral: Clinical Social Work Discharge Planning Services: CM Consult Post Acute Care Choice: Skilled Nursing Facility Living arrangements for the past 2 months: Apartment                 DME Arranged: N/A DME Agency: NA       HH Arranged: NA HH Agency: NA        Prior Living Arrangements/Services Living arrangements for the past 2 months: Apartment Lives with:: Self Patient language and need for interpreter reviewed:: Yes Do you feel safe going back to the place where you live?: Yes      Need for Family Participation in Patient Care: Yes (Comment) Care giver support system in place?: Yes (comment)   Criminal Activity/Legal Involvement Pertinent to Current Situation/Hospitalization:  Yes - Comment as needed  Activities of Daily Living Home Assistive Devices/Equipment: None ADL Screening (condition at time of admission) Patient's cognitive ability adequate to safely complete daily activities?: Yes Is the patient deaf or have difficulty hearing?: No Does the patient have difficulty seeing, even when wearing glasses/contacts?: No Does the patient have difficulty concentrating, remembering, or making decisions?: No Patient able to express need for assistance with ADLs?: Yes Does the patient have difficulty dressing or bathing?: No Independently performs ADLs?: Yes (appropriate for developmental age) Does the patient have difficulty walking or climbing stairs?: Yes (needs assistance) Weakness of Legs: Right Weakness of Arms/Hands: None  Permission Sought/Granted Permission sought to share information with : Family Supports, Oceanographer granted to share information with : Yes, Verbal Permission Granted  Share Information with NAME: Pricilla Holm  Permission granted to share info w AGENCY: Facilities  Permission granted to share info w Relationship: Son  Permission granted to share info w Contact Information: [phone]  Emotional Assessment Appearance:: Other (Comment Required (Unable to assess. Assessment completed telephonically.) Attitude/Demeanor/Rapport: Engaged Affect (typically observed): Accepting, Calm Orientation: : Oriented to Self, Oriented to Place, Oriented to  Time, Oriented to Situation Alcohol / Substance Use: Not Applicable Psych Involvement: No (comment)  Admission diagnosis:  Hyponatremia [E87.1] Weakness [R53.1] Failure to thrive in adult [R62.7] Dysphagia, unspecified type [R13.10] Patient Active Problem List   Diagnosis Date Noted  . Protein-calorie malnutrition, severe 09/13/2020  . Esophageal motility disorder   . Palliative care by specialist   .  Advanced care planning/counseling discussion   .  Failure to thrive in adult 09/11/2020  . Avoidant-restrictive food intake disorder (ARFID) 08/30/2020  . Paroxysmal atrial fibrillation (HCC) 03/22/2020  . Vitamin D deficiency 03/01/2020  . Dysphagia   . Arrhythmia 08/25/2019  . Rectal bleeding   . Low grade squamous intraepith lesion on cytologic smear cervix (lgsil) 08/04/2018  . Chronic venous insufficiency 07/05/2018  . Lymphedema 07/05/2018  . Hypertension 04/02/2018  . Swelling of limb 04/02/2018  . Depression, major, recurrent, mild (HCC) 12/29/2017  . Leukopenia 05/24/2017  . Allergic rhinitis, seasonal 06/29/2015  . Clavus 06/29/2015  . 1st degree AV block 06/29/2015  . Hammer toe 06/29/2015  . H/O: HTN (hypertension) 06/29/2015  . Blood glucose elevated 06/29/2015  . Floater, vitreous 06/29/2015  . Bilateral tinnitus 06/29/2015  . Hypothyroidism (acquired) 04/02/2015  . Gastroesophageal reflux disease 04/02/2015  . Hammer toe of right foot 04/02/2015  . Scoliosis of thoracic spine 04/02/2015  . Urethral prolapse 04/02/2015  . Corn of toe 04/02/2015  . Hyperlipidemia 04/02/2015  . Varicose veins of both lower extremities 04/02/2015   PCP:  Alba Cory, MD Pharmacy:   [facility] [address] [place] - [address] [place] [address] Phone: [phone] Fax: [phone]     Social Determinants of Health (SDOH) Interventions    Readmission Risk Interventions No flowsheet data found.

## 2020-09-16 NOTE — NC FL2 (Signed)
[place] MEDICAID FL2 LEVEL OF CARE SCREENING TOOL     IDENTIFICATION  Patient Name: Maureen [name] Birthdate: December 14, 1945 Sex: female Admission Date (Current Location): 09/11/2020  [place] and [place] Number:  Chiropodist and Address:  [facility], [address] [place], [place] [address]      Provider Number: [id number]  Attending Physician Name and Address:  Pennie Banter, DO  Relative Name and Phone Number:  SonPricilla Holm [phone]    Current Level of Care: Hospital Recommended Level of Care: Skilled Nursing Facility Prior Approval Number:    Date Approved/Denied:   PASRR Number: [id number]  Discharge Plan: SNF    Current Diagnoses: Patient Active Problem List   Diagnosis Date Noted  . Protein-calorie malnutrition, severe 09/13/2020  . Esophageal motility disorder   . Palliative care by specialist   . Advanced care planning/counseling discussion   . Failure to thrive in adult 09/11/2020  . Avoidant-restrictive food intake disorder (ARFID) 08/30/2020  . Paroxysmal atrial fibrillation (HCC) 03/22/2020  . Vitamin D deficiency 03/01/2020  . Dysphagia   . Arrhythmia 08/25/2019  . Rectal bleeding   . Low grade squamous intraepith lesion on cytologic smear cervix (lgsil) 08/04/2018  . Chronic venous insufficiency 07/05/2018  . Lymphedema 07/05/2018  . Hypertension 04/02/2018  . Swelling of limb 04/02/2018  . Depression, major, recurrent, mild (HCC) 12/29/2017  . Leukopenia 05/24/2017  . Allergic rhinitis, seasonal 06/29/2015  . Clavus 06/29/2015  . 1st degree AV block 06/29/2015  . Hammer toe 06/29/2015  . H/O: HTN (hypertension) 06/29/2015  . Blood glucose elevated 06/29/2015  . Floater, vitreous 06/29/2015  . Bilateral tinnitus 06/29/2015  . Hypothyroidism (acquired) 04/02/2015  . Gastroesophageal reflux disease 04/02/2015  . Hammer toe of right foot 04/02/2015  . Scoliosis of thoracic spine 04/02/2015   . Urethral prolapse 04/02/2015  . Corn of toe 04/02/2015  . Hyperlipidemia 04/02/2015  . Varicose veins of both lower extremities 04/02/2015    Orientation RESPIRATION BLADDER Height & Weight     Self, Time, Situation, Place  Normal Continent Weight: 90 lb 2.7 oz (40.9 kg) Height:  5\' 1"  (154.9 cm)  BEHAVIORAL SYMPTOMS/MOOD NEUROLOGICAL BOWEL NUTRITION STATUS      Continent Diet  AMBULATORY STATUS COMMUNICATION OF NEEDS Skin   Extensive Assist Verbally Normal                       Personal Care Assistance Level of Assistance  Bathing, Feeding, Dressing Bathing Assistance: Maximum assistance Feeding assistance: Limited assistance Dressing Assistance: Maximum assistance     Functional Limitations Info  Sight, Speech, Hearing Sight Info: Adequate Hearing Info: Adequate Speech Info: Adequate    SPECIAL CARE FACTORS FREQUENCY                       Contractures Contractures Info: Not present    Additional Factors Info  Code Status, Allergies Code Status Info: DNR Allergies Info: Aspirin, Citalopram, Codeine, Penicillins           Current Medications (09/16/2020):  This is the current hospital active medication list Current Facility-Administered Medications  Medication Dose Route Frequency Provider Last Rate Last Admin  . 0.9 %  sodium chloride infusion   Intravenous Continuous 09/18/2020, DO 75 mL/hr at 09/16/20 1349 New Bag at 09/16/20 1349  . clonazepam (KLONOPIN) disintegrating tablet 0.125 mg  0.125 mg Oral Daily 09/18/20, NP   0.125 mg at 09/16/20 0902  .  enoxaparin (LOVENOX) injection 30 mg  30 mg Subcutaneous Q24H Esaw Grandchild A, DO      . feeding supplement (ENSURE ENLIVE / ENSURE PLUS) liquid 237 mL  237 mL Oral TID BM Esaw Grandchild A, DO   237 mL at 09/16/20 1344  . influenza vaccine adjuvanted (FLUAD) injection 0.5 mL  0.5 mL Intramuscular Tomorrow-1000 Esaw Grandchild A, DO      . levothyroxine (SYNTHROID) tablet 88  mcg  88 mcg Oral QAC breakfast Gertha Calkin, MD   88 mcg at 09/16/20 0607  . mirtazapine (REMERON) tablet 15 mg  15 mg Oral QHS Gertha Calkin, MD   15 mg at 09/13/20 1943  . pantoprazole sodium (PROTONIX) 40 mg/20 mL oral suspension 40 mg  40 mg Oral BID Shanlever, Charmayne Sheer, RPH      . pneumococcal 23 valent vaccine (PNEUMOVAX-23) injection 0.5 mL  0.5 mL Intramuscular Tomorrow-1000 Esaw Grandchild A, DO      . Vitamin D (Ergocalciferol) (DRISDOL) capsule 50,000 Units  50,000 Units Oral Q7 days Pennie Banter, DO         Discharge Medications: Please see discharge summary for a list of discharge medications.  Relevant Imaging Results:  Relevant Lab Results:   Additional Information SSN: [id number]  Dominic Pea, LCSW

## 2020-09-17 DIAGNOSIS — R627 Adult failure to thrive: Secondary | ICD-10-CM | POA: Diagnosis not present

## 2020-09-17 LAB — RESP PANEL BY RT-PCR (FLU A&B, COVID) ARPGX2
Influenza A by PCR: NEGATIVE
Influenza B by PCR: NEGATIVE
SARS Coronavirus 2 by RT PCR: NEGATIVE

## 2020-09-17 LAB — URIC ACID: Uric Acid, Serum: 2.2 mg/dL — ABNORMAL LOW (ref 2.5–7.1)

## 2020-09-17 LAB — BASIC METABOLIC PANEL
Anion gap: 6 (ref 5–15)
BUN: 8 mg/dL (ref 8–23)
CO2: 28 mmol/L (ref 22–32)
Calcium: 8.1 mg/dL — ABNORMAL LOW (ref 8.9–10.3)
Chloride: 99 mmol/L (ref 98–111)
Creatinine, Ser: 0.46 mg/dL (ref 0.44–1.00)
GFR, Estimated: >60 mL/min (ref >60)
Glucose, Bld: 79 mg/dL (ref 70–99)
Potassium: 4.1 mmol/L (ref 3.5–5.1)
Sodium: 133 mmol/L — ABNORMAL LOW (ref 135–145)

## 2020-09-17 LAB — CORTISOL-AM, BLOOD: Cortisol - AM: 13.2 ug/dL (ref 6.7–22.6)

## 2020-09-17 MED ORDER — MIDODRINE HCL 5 MG PO TABS
2.5000 mg | ORAL_TABLET | Freq: Three times a day (TID) | ORAL | Status: DC
  Administered 2020-09-17 – 2020-09-19 (×7): 2.5 mg via ORAL
  Filled 2020-09-17 (×7): qty 1

## 2020-09-17 MED ORDER — MIDODRINE HCL 5 MG PO TABS: 2.5000 mg | ORAL_TABLET | Freq: Three times a day (TID) | ORAL | Status: DC

## 2020-09-17 NOTE — Progress Notes (Addendum)
PROGRESS NOTE    Maureen [name]   ZOX:[id number]  DOB: 1946/10/27  PCP: Steele Sizer, MD    DOA: 09/11/2020 LOS: 6   Brief Narrative   Maureen [name] is a 74 y.o. female with pertinent past medical history of chronic weight loss due to dysphagia from esophageal dysmotility and likely avoidant restrictive food intake disorder who presented to the ED on 09/11/20 on referral from primary care due to abnormal labs drawn 4 days prior.  Patient denied any specific complaints at time of admission other than generalized weakness.  Her son reported that patient has been progressively weak and had minimal PO intake aside from Ensure.     Assessment & Plan   Principal Problem:   Failure to thrive in adult Active Problems:   Hypothyroidism (acquired)   Hypertension   Dysphagia   Avoidant-restrictive food intake disorder (ARFID)   Protein-calorie malnutrition, severe   Esophageal motility disorder   Palliative care by specialist   Advanced care planning/counseling discussion   Failure to thrive in adult - pt has chronic and progressive weight loss and generalized weakness due to inadequate nutrition secondary to esophageal dysmotility with dysphagia and avoidant restrictive food intake disorder.  Pt with 1 year (or longer) history of dysphagia and resultant fear of swallowing.   11/18 - per patient tolerating full liquid diet okay.  Met with palliative care, agrees to PEG tube if needed.  No respiratory symptoms. 11/19 - good caloric intake yesterday without dysphagia.  No respiratory symptoms. 11/20 - adequate calories on FLD since admission, no PEG indicated for now Patient is doing very well on current dietary interventions.   --SLP consulted --Full liquid diet with supplements per RD --Dietician consulted --Palliative care consulted - continue full scope care --GI consulted for consideration of PEG tube placement, not indicated at this time.  Follow up if needed as  outpatient. --Continue Remeron, Klonopin PRN   Dysphagia due to esophageal dysmotility - continue IV PPI, Carafate.  Prior EGD showed non-obstructing Schaztki's ring. Avoidant-Restrictive Food Intake Disorders - due to anxiety and fear with swallowing as result of dysphagia / choking episodes.  Imaging this admission supports recurrent / chronic dysphagia, showing "Complete collapse of the right lower lobe with occlusion of the a right lower lobar pulmonary bronchus suggesting and endobronchial mass such as aspirated debris or mucous plug.... Patulous esophagus demonstrating an air-fluid level. This may reflect changes of gastroesophageal reflux or esophageal dysmotility."  Generalized weakness - PT recommends SNF for rehab, pt agreeable.  TOC consulted for placement.   Hyponatremia - suspect hypovolemic given above history.  Improved with IV fluids but has slowly trended down since off IV fluids.  Appears euvolemic, hypotonic etiology with differential of hypothyroidism, adrenal insufficiency, SIADH, meds.  Hypothyroidism seems most likely as TSH was quite high due to noncompliance with med at home.  Uric acid mildly low at 2.2 11/21 - ran fluids again and Na improved 130-->133. --Monitor BMP.   --D/C fluids and monitor --Cortisol level from this AM still pending to rule out adrenal insufficency   Hypotension - started on low dose midodrine 2.5 mg TID WC.  Cortisol level is pending.  Monitor BP and maintain MAP>65.   Vitamin D Deficiency - started on 50k units weekly until level >30.  PCP follow up.   Leukopenia - POA, chronic.  Referred to hematology by PCP.  On chart review, evaluation has been unremarkable.   Hx of Depression / Anxiety - Continue Remeron, PRN Klonopin  Hypothyroidism - TSH abnormal due to noncompliance with Synthroid.  Resume Synthroid at currently prescribed dose.  Follow up TSH in about 6 weeks.    Normocytic anemia - Hbg slowly trending down from 10.8>>9.2  today.  No signs of bleeding.  Iron levels normal except slightly low TIBC, folate and B12 are normal.  Check FOBT.  Suspect anemia of chronic disease.   Patient BMI: Body mass index is 16.25 kg/m.   DVT prophylaxis: enoxaparin (LOVENOX) injection 30 mg Start: 09/14/20 1600 SCDs Start: 09/11/20 1510   Diet:  Diet Orders (From admission, onward)    Start     Ordered   09/11/20 1514  Diet full liquid Room service appropriate? Yes; Fluid consistency: Thin  Diet effective now       Question Answer Comment  Room service appropriate? Yes   Fluid consistency: Thin      09/11/20 1515            Code Status: DNR    Subjective 09/17/20    Pt seen at bedside today.  Reports she feels well.  Asks when she'll be able to get out of hospital.  Advised rehab placement underway.  She requests her son be updated about status of bed search.  She has no acute complaints and denies issues with swallowing.  Disposition Plan & Communication   Status is: Inpatient  Remains inpatient appropriate because: pt lives alone, generally weak and requires placement for rehab.     Dispo: The patient is from: Home              Anticipated d/c is to: SNF              Anticipated d/c date is: pending SNF bed              Patient currently IS medically stable for discharge    Family Communication: son updated by phone 11/19, will attempt call this afternoon.   Consults, Procedures, Significant Events   Consultants:   Palliative Care  Procedures:   None  Antimicrobials:  Anti-infectives (From admission, onward)   None         Objective   Vitals:   09/17/20 0540 09/17/20 0745 09/17/20 1213 09/17/20 1711  BP: 91/69 98/64 102/68 97/70  Pulse: 78 74 89 88  Resp: 18 16 16    Temp: 98.6 F (37 C) 97.9 F (36.6 C) 98.3 F (36.8 C)   TempSrc: Oral Oral Oral   SpO2: 100% 100% 100% 100%  Weight:      Height:        Intake/Output Summary (Last 24 hours) at 09/17/2020 1732 Last data  filed at 09/17/2020 0745 Gross per 24 hour  Intake 742.42 ml  Output 1 ml  Net 741.42 ml   Filed Weights   09/15/20 0500 09/16/20 0500 09/17/20 0500  Weight: 40.4 kg 40.9 kg 39 kg    Physical Exam:  General exam: awake, alert, no acute distress, cachectic and frail Respiratory system: CTAB, on room air, normal respiratory effort. Cardiovascular system: normal S1/S2, RRR, no peripheral edema Gastrointestinal system: sunken soft abdomen, no tenderness or distention Central nervous system: grossly non-focal exam, normal speech    Labs   Data Reviewed: I have personally reviewed following labs and imaging studies  CBC: Recent Labs  Lab 09/11/20 1140 09/13/20 0540 09/14/20 0359 09/15/20 0409 09/16/20 0404  WBC 2.6* 2.6* 2.3* 3.1* 3.1*  NEUTROABS 1.1*  --   --   --   --  HGB 10.2* 9.8* 9.1* 9.5* 9.2*  HCT 30.0* 28.0* 26.0* 27.7* 26.5*  MCV 92.3 90.6 91.2 91.4 91.4  PLT 150 140* 139* 147* 474   Basic Metabolic Panel: Recent Labs  Lab 09/11/20 1140 09/11/20 1140 09/13/20 0540 09/14/20 0359 09/15/20 0409 09/16/20 0404 09/17/20 0346  NA 128*   < > 131* 133* 132* 130* 133*  K 3.9   < > 4.0 3.9 3.9 4.0 4.1  CL 88*   < > 95* 99 96* 97* 99  CO2 33*   < > 27 29 29 29 28   GLUCOSE 79   < > 71 77 78 84 79  BUN 11   < > 8 11 10 11 8   CREATININE 0.57   < > 0.44 0.51 0.45 0.46 0.46  CALCIUM 8.7*   < > 8.4* 8.0* 8.5* 8.3* 8.1*  MG 2.3  --  2.1 2.1 2.2  --   --   PHOS 3.3  --  3.6 3.2 3.2 3.6  --    < > = values in this interval not displayed.   GFR: Estimated Creatinine Clearance: 38 mL/min (by C-G formula based on SCr of 0.46 mg/dL). Liver Function Tests: Recent Labs  Lab 09/11/20 1140  AST 41  ALT 31  ALKPHOS 69  BILITOT 0.7  PROT 7.3  ALBUMIN 3.8   Recent Labs  Lab 09/11/20 1140  LIPASE 31   No results for input(s): AMMONIA in the last 168 hours. Coagulation Profile: No results for input(s): INR, PROTIME in the last 168 hours. Cardiac Enzymes: No  results for input(s): CKTOTAL, CKMB, CKMBINDEX, TROPONINI in the last 168 hours. BNP (last 3 results) No results for input(s): PROBNP in the last 8760 hours. HbA1C: No results for input(s): HGBA1C in the last 72 hours. CBG: No results for input(s): GLUCAP in the last 168 hours. Lipid Profile: No results for input(s): CHOL, HDL, LDLCALC, TRIG, CHOLHDL, LDLDIRECT in the last 72 hours. Thyroid Function Tests: No results for input(s): TSH, T4TOTAL, FREET4, T3FREE, THYROIDAB in the last 72 hours. Anemia Panel: Recent Labs    09/15/20 0409  FOLATE 10.4   Sepsis Labs: No results for input(s): PROCALCITON, LATICACIDVEN in the last 168 hours.  Recent Results (from the past 240 hour(s))  Respiratory Panel by RT PCR (Flu A&B, Covid) - Nasopharyngeal Swab     Status: None   Collection Time: 09/11/20  2:46 PM   Specimen: Nasopharyngeal Swab  Result Value Ref Range Status   SARS Coronavirus 2 by RT PCR NEGATIVE NEGATIVE Final    Comment: (NOTE) SARS-CoV-2 target nucleic acids are NOT DETECTED.  The SARS-CoV-2 RNA is generally detectable in upper respiratoy specimens during the acute phase of infection. The lowest concentration of SARS-CoV-2 viral copies this assay can detect is 131 copies/mL. A negative result does not preclude SARS-Cov-2 infection and should not be used as the sole basis for treatment or other patient management decisions. A negative result may occur with  improper specimen collection/handling, submission of specimen other than nasopharyngeal swab, presence of viral mutation(s) within the areas targeted by this assay, and inadequate number of viral copies (<131 copies/mL). A negative result must be combined with clinical observations, patient history, and epidemiological information. The expected result is Negative.  Fact Sheet for Patients:  PinkCheek.be  Fact Sheet for Healthcare Providers:   GravelBags.it  This test is no t yet approved or cleared by the [place] FDA and  has been authorized for detection and/or diagnosis of SARS-CoV-2 by FDA  under an Emergency Use Authorization (EUA). This EUA will remain  in effect (meaning this test can be used) for the duration of the COVID-19 declaration under Section 564(b)(1) of the Act, 21 U.S.C. section 360bbb-3(b)(1), unless the authorization is terminated or revoked sooner.     Influenza A by PCR NEGATIVE NEGATIVE Final   Influenza B by PCR NEGATIVE NEGATIVE Final    Comment: (NOTE) The Xpert Xpress SARS-CoV-2/FLU/RSV assay is intended as an aid in  the diagnosis of influenza from Nasopharyngeal swab specimens and  should not be used as a sole basis for treatment. Nasal washings and  aspirates are unacceptable for Xpert Xpress SARS-CoV-2/FLU/RSV  testing.  Fact Sheet for Patients: PinkCheek.be  Fact Sheet for Healthcare Providers: GravelBags.it  This test is not yet approved or cleared by the [place] FDA and  has been authorized for detection and/or diagnosis of SARS-CoV-2 by  FDA under an Emergency Use Authorization (EUA). This EUA will remain  in effect (meaning this test can be used) for the duration of the  Covid-19 declaration under Section 564(b)(1) of the Act, 21  U.S.C. section 360bbb-3(b)(1), unless the authorization is  terminated or revoked. Performed at [facility], [address] [place], [place] [address]       Imaging Studies   No results found.   Medications   Scheduled Meds: . clonazepam  0.125 mg Oral Daily  . enoxaparin (LOVENOX) injection  30 mg Subcutaneous Q24H  . feeding supplement  237 mL Oral TID BM  . influenza vaccine adjuvanted  0.5 mL Intramuscular Tomorrow-1000  . levothyroxine  88 mcg Oral QAC breakfast  . midodrine  2.5 mg Oral TID WC  . mirtazapine  15 mg Oral QHS   . pantoprazole sodium  40 mg Oral BID  . pneumococcal 23 valent vaccine  0.5 mL Intramuscular Tomorrow-1000  . Vitamin D (Ergocalciferol)  50,000 Units Oral Q7 days   Continuous Infusions:      LOS: 6 days    Time spent: 25 minutes with > 50% spent in coordination of care and direct patient contact.    Ezekiel Slocumb, DO [facility]  09/17/2020, 5:32 PM    If 7PM-7AM, please contact night-coverage. How to contact the [facility] Attending or Consulting provider [place] or covering provider during after hours [place], for this patient?    1. Check the care team in [facility] and look for a) attending/consulting TRH provider listed and b) the [facility] team listed 2. Log into [email or web address] and use [place]'s universal password to access. If you do not have the password, please contact the hospital operator. 3. Locate the [facility] provider you are looking for under [facility] and page to a number that you can be directly reached. 4. If you still have difficulty reaching the provider, please page the [facility] (Director on Call) for the Hospitalists listed on amion for assistance.

## 2020-09-17 NOTE — TOC Progression Note (Addendum)
Transition of Care [facility]) - Progression Note    Patient Details  Name: Maureen [name] MRN: [id number] Date of Birth: Feb 23, 1946  [facility] CM/SW Contact  Allayne Butcher, RN Phone Number: 09/17/2020, 12:00 PM  Clinical Narrative:    [facility] offered a bed and bed accepted.  Patient's insurance is managed by [facility] and facility with start authorization.  Patient will be medically ready tomorrow or Wed for discharge.  [facility] is aware that patient has not been Vaccinated.     Expected Discharge Plan: Skilled Nursing Facility Barriers to Discharge: Continued Medical Work up  Expected Discharge Plan and Services Expected Discharge Plan: Skilled Nursing Facility In-house Referral: Clinical Social Work Discharge Planning Services: CM Consult Post Acute Care Choice: Skilled Nursing Facility Living arrangements for the past 2 months: Apartment                 DME Arranged: N/A DME Agency: NA       HH Arranged: NA HH Agency: NA         Social Determinants of Health (SDOH) Interventions    Readmission Risk Interventions No flowsheet data found.

## 2020-09-17 NOTE — Care Management Important Message (Signed)
Important Message  Patient Details  Name: Maureen [name] MRN: [id number] Date of Birth: [birth year]   Medicare Important Message Given:  Yes     Johnell Comings 09/17/2020, 10:33 AM

## 2020-09-18 DIAGNOSIS — E43 Unspecified severe protein-calorie malnutrition: Secondary | ICD-10-CM | POA: Diagnosis not present

## 2020-09-18 DIAGNOSIS — F5082 Avoidant/restrictive food intake disorder: Secondary | ICD-10-CM | POA: Diagnosis not present

## 2020-09-18 DIAGNOSIS — K224 Dyskinesia of esophagus: Secondary | ICD-10-CM | POA: Diagnosis not present

## 2020-09-18 DIAGNOSIS — R627 Adult failure to thrive: Secondary | ICD-10-CM | POA: Diagnosis not present

## 2020-09-18 LAB — CBC WITH DIFFERENTIAL/PLATELET
Abs Immature Granulocytes: 0.01 10*3/uL (ref 0.00–0.07)
Basophils Absolute: 0 10*3/uL (ref 0.0–0.1)
Basophils Relative: 1 %
Eosinophils Absolute: 0.1 10*3/uL (ref 0.0–0.5)
Eosinophils Relative: 3 %
HCT: 27.6 % — ABNORMAL LOW (ref 36.0–46.0)
Hemoglobin: 9.7 g/dL — ABNORMAL LOW (ref 12.0–15.0)
Immature Granulocytes: 0 %
Lymphocytes Relative: 41 %
Lymphs Abs: 1.4 10*3/uL (ref 0.7–4.0)
MCH: 32 pg (ref 26.0–34.0)
MCHC: 35.1 g/dL (ref 30.0–36.0)
MCV: 91.1 fL (ref 80.0–100.0)
Monocytes Absolute: 0.4 10*3/uL (ref 0.1–1.0)
Monocytes Relative: 12 %
Neutro Abs: 1.4 10*3/uL — ABNORMAL LOW (ref 1.7–7.7)
Neutrophils Relative %: 43 %
Platelets: 176 10*3/uL (ref 150–400)
RBC: 3.03 MIL/uL — ABNORMAL LOW (ref 3.87–5.11)
RDW: 15 % (ref 11.5–15.5)
WBC: 3.4 10*3/uL — ABNORMAL LOW (ref 4.0–10.5)
nRBC: 0 % (ref 0.0–0.2)

## 2020-09-18 LAB — BASIC METABOLIC PANEL
Anion gap: 7 (ref 5–15)
BUN: 11 mg/dL (ref 8–23)
CO2: 28 mmol/L (ref 22–32)
Calcium: 8.4 mg/dL — ABNORMAL LOW (ref 8.9–10.3)
Chloride: 97 mmol/L — ABNORMAL LOW (ref 98–111)
Creatinine, Ser: 0.49 mg/dL (ref 0.44–1.00)
GFR, Estimated: >60 mL/min (ref >60)
Glucose, Bld: 80 mg/dL (ref 70–99)
Potassium: 4.2 mmol/L (ref 3.5–5.1)
Sodium: 132 mmol/L — ABNORMAL LOW (ref 135–145)

## 2020-09-18 MED ORDER — SODIUM CHLORIDE 1 G PO TABS
1.0000 g | ORAL_TABLET | Freq: Two times a day (BID) | ORAL | Status: DC
  Filled 2020-09-18 (×3): qty 1

## 2020-09-18 MED ORDER — ADULT MULTIVITAMIN LIQUID CH
15.0000 mL | Freq: Every day | ORAL | Status: DC
  Filled 2020-09-18: qty 15

## 2020-09-18 NOTE — Progress Notes (Signed)
Occupational Therapy Treatment Patient Details Name: Maureen [name] MRN: [id number] DOB: [birth year] Today's Date: 09/18/2020    History of present illness 74 y.o. female with pertinent past medical history of chronic weight loss due to dysphagia from esophageal dysmotility and likely avoidant restrictive food intake disorder who presented to the ED on 09/11/20 on referral from primary care due to abnormal labs, progressive weakness and decreased PO intake.   OT comments  Ms. Jarrett was seen for OT treatment and follow up regarding safety and functional independence with ADL management. Upon arrival to room pt awake/alert, denies pain, and agreeable to OT tx session. Pt states she would like to perform seated wash-up and change into clean gown. OT facilitates ADL management as described below (see ADL section for additional detail regarding occupational performance). Pt generally requires supervision for safety during functional mobility in room. She demonstrates decreased lower body access and decreased activity tolerance, which continue to functionally impact her ability to perform ADL/self-care independently. She requires MOD A to support bathing of her back, lower legs and feet. OT provided encouragement and education of safety/falls prevention t/o session. Pt is progressing toward OT goals and continues to benefit from skilled OT services to maximize return to PLOF and minimize risk of future falls, injury, caregiver burden, and readmission. Will continue to follow POC as written. Discharge recommendation updated to STR to maximize pt safety and return to [facility] upon hospital DC.    Follow Up Recommendations  SNF    Equipment Recommendations  3 in 1 bedside commode    Recommendations for Other Services      Precautions / Restrictions Precautions Precautions: Fall Restrictions Weight Bearing Restrictions: No       Mobility Bed Mobility Overal bed mobility: Needs Assistance Bed  Mobility: Supine to Sit;Sit to Supine     Supine to sit: Supervision Sit to supine: Min assist   General bed mobility comments: requires assist to elevate bilat LEs over edge of bed with return to supine  Transfers Overall transfer level: Needs assistance Equipment used: None Transfers: Sit to/from Stand Sit to Stand: Supervision Stand pivot transfers: Supervision       General transfer comment: Pt generally reliant on 1 UE support on bed rails to optimize balance, is able to perform sit to stand/standing tasks w/o UE support with.    Balance Overall balance assessment: Needs assistance Sitting-balance support: No upper extremity supported;Feet supported Sitting balance-Leahy Scale: Good Sitting balance - Comments: Steady static/dynamic sitting, reaching within BOS.   Standing balance support: No upper extremity supported;During functional activity Standing balance-Leahy Scale: Fair Standing balance comment: Pt able to perform STS bathing and dressing tasks w/o UE support.                           ADL either performed or assessed with clinical judgement   ADL Overall ADL's : Needs assistance/impaired     Grooming: Wash/dry hands;Standing;Supervision/safety;Wash/dry face   Upper Body Bathing: Sitting;Moderate assistance Upper Body Bathing Details (indicate cue type and reason): MOD A to reach shoulders and back during seated wash-up. Min cueing for encouragment as pt self-limiting of participation in functional tasks. Lower Body Bathing: Moderate assistance;Sit to/from stand Lower Body Bathing Details (indicate cue type and reason): MOD A to wash lower legs and feet 2/2 decreased lower body access. Pt is able to wash uppper legs, buttocks and peri area with supervision from STS. Upper Body Dressing : Sitting;Set up;Supervision/safety Upper Body Dressing  Details (indicate cue type and reason): Pt requires assist to tie hospital gown at back. Would not anticipate  need for assist for UB dressing with pull over clothing. Lower Body Dressing: Sit to/from stand;Moderate assistance Lower Body Dressing Details (indicate cue type and reason): MOD A to don hospital socks and thread legs through mesh underwear. Toilet Transfer: Merchandiser, retail Details (indicate cue type and reason): Pt is able to transfer to [facility] with supervision for safety. Per nsg, she has consistently been getting up to [facility] w/o assist.         Functional mobility during ADLs: Supervision/safety General ADL Comments: Pt ambulating and performing functional transfers/tasks with close supervision for safety without use of AD.     Vision Patient Visual Report: No change from baseline     Perception     Praxis      Cognition Arousal/Alertness: Awake/alert Behavior During Therapy: WFL for tasks assessed/performed Overall Cognitive Status: Within Functional Limits for tasks assessed                                          Exercises Other Exercises Other Exercises: OT facilitates toilet transfer to/from [facility], functional mobility in room, seated UB/LB wash-up, UB/LB dressing tasks with cueing for safety and assist provided t/o session. See ADL section for additional detail regarding occupational performance.   Shoulder Instructions       General Comments      Pertinent Vitals/ Pain       Pain Assessment: No/denies pain  Home Living                                          Prior Functioning/Environment              Frequency  Min 2X/week        Progress Toward Goals  OT Goals(current goals can now be found in the care plan section)  Progress towards OT goals: Progressing toward goals  Acute Rehab OT Goals Patient Stated Goal: to get stronger OT Goal Formulation: With patient Time For Goal Achievement: 09/28/20 Potential to Achieve Goals: Good  Plan Discharge plan needs to be updated;Frequency  remains appropriate    Co-evaluation                 AM-PAC OT "6 Clicks" Daily Activity     Outcome Measure   Help from another person eating meals?: A Little Help from another person taking care of personal grooming?: A Little Help from another person toileting, which includes using toliet, bedpan, or urinal?: A Little Help from another person bathing (including washing, rinsing, drying)?: A Little Help from another person to put on and taking off regular upper body clothing?: A Little Help from another person to put on and taking off regular lower body clothing?: A Little 6 Click Score: 18    End of Session    OT Visit Diagnosis: Unsteadiness on feet (R26.81)   Activity Tolerance Patient tolerated treatment well   Patient Left in bed;with bed alarm set;with call bell/phone within reach   Nurse Communication Mobility status        Time: 2122-4825 OT Time Calculation (min): 47 min  Charges: OT General Charges $OT Visit: 1 Visit OT Treatments $Self Care/Home Management : 38-52 mins  Anterio Scheel  Smith Robert, M.S., OTR/L Ascom: [phone] 09/18/20, 3:52 PM

## 2020-09-18 NOTE — Progress Notes (Signed)
PROGRESS NOTE    LADANA CHAVERO   GYK:[id number]  DOB: 06/20/1946  PCP: Steele Sizer, MD    DOA: 09/11/2020 LOS: 7   Brief Narrative   Maureen [name] is a 74 y.o. female with pertinent past medical history of chronic weight loss due to dysphagia from esophageal dysmotility and likely avoidant restrictive food intake disorder who presented to the ED on 09/11/20 on referral from primary care due to abnormal labs drawn 4 days prior.  Patient denied any specific complaints at time of admission other than generalized weakness.  Her son reported that patient has been progressively weak and had minimal PO intake aside from Ensure.     Assessment & Plan   Principal Problem:   Failure to thrive in adult Active Problems:   Hypothyroidism (acquired)   Hypertension   Dysphagia   Avoidant-restrictive food intake disorder (ARFID)   Protein-calorie malnutrition, severe   Esophageal motility disorder   Palliative care by specialist   Advanced care planning/counseling discussion   Failure to thrive in adult - pt has chronic and progressive weight loss and generalized weakness due to inadequate nutrition secondary to esophageal dysmotility with dysphagia and avoidant restrictive food intake disorder.  Pt with 1 year (or longer) history of dysphagia and resultant fear of swallowing.   11/18 - per patient tolerating full liquid diet okay.  Met with palliative care, agrees to PEG tube if needed.  No respiratory symptoms. 11/19 - good caloric intake yesterday without dysphagia.  No respiratory symptoms. 11/20 - adequate calories on FLD since admission, no PEG indicated for now Patient is doing very well on current dietary interventions.   --SLP consulted --Full liquid diet with supplements per RD --Dietician consulted --Palliative care consulted - continue full scope care, pt wants feeding tube if needed, but has had good PO intake  --GI consulted for consideration of PEG tube placement, not  indicated at this time.  Follow up if needed as outpatient. --Continue Remeron, Klonopin PRN   Severe protein calorie malnutrition - Dietician following.  Calories counted x 2 days and pt getting adequate intake with full liquid diet and supplement drinks.   Dysphagia due to esophageal dysmotility - Prior EGD showed non-obstructing Schaztki's ring. Avoidant-Restrictive Food Intake Disorders - due to anxiety and fear with swallowing as result of dysphagia / choking episodes. Imaging this admission supports recurrent / chronic dysphagia, showing "Complete collapse of the right lower lobe with occlusion of the a right lower lobar pulmonary bronchus suggesting and endobronchial mass such as aspirated debris or mucous plug.... Patulous esophagus demonstrating an air-fluid level. This may reflect changes of gastroesophageal reflux or esophageal dysmotility." --patient has not had any respiratory symptoms or signs of infection to suggest aspiration PNA - monitor. --d/c IV Protonix and monitor   Generalized weakness - PT recommends SNF for rehab, pt agreeable.  TOC consulted for placement.  Awaiting insurance auth.   Hyponatremia - suspect hypovolemic given above history.  Improved with IV fluids but has slowly trended down since off IV fluids.  Appears euvolemic, hypotonic etiology with differential of hypothyroidism, adrenal insufficiency, SIADH, meds.  Hypothyroidism seems most likely as TSH was quite high due to noncompliance with med at home.  Uric acid mildly low at 2.2.  AM Cortisol normal. 11/21 - ran fluids again and Na improved 130-->133. 11/22 - off fluids, slight decline in Na again.   --Monitor BMP.   --D/C fluids and monitor --salt tabs trial repeat BMP in AM, follow up BMP within  a week of d/c   Hypotension - started on low dose midodrine 2.5 mg TID WC.  Cortisol level normal.  No signs of infection.  Likely baseline BP.  Monitor BP and maintain MAP>65.   Vitamin D Deficiency -  started on 50k units weekly until level >30.  PCP follow up.   Leukopenia - POA, chronic.  Referred to hematology by PCP.  On chart review, evaluation has been unremarkable.   Hx of Depression / Anxiety - Continue Remeron, PRN Klonopin   Hypothyroidism - TSH quite elevated on admission due to noncompliance with Synthroid.   Resume Synthroid at prescribed dose.  Follow up TSH in about 6 weeks.    Normocytic anemia - Hbg stable in 9's.  No signs of bleeding.  Iron levels normal except slightly low TIBC, folate and B12 are normal.  Check FOBT - pending.  Suspect anemia of chronic disease.   Paroxysmal A-fib - rate controlled.  Not on medications.   Patient BMI: Body mass index is 16.25 kg/m.   DVT prophylaxis: enoxaparin (LOVENOX) injection 30 mg Start: 09/14/20 1600 SCDs Start: 09/11/20 1510   Diet:  Diet Orders (From admission, onward)    Start     Ordered   09/11/20 1514  Diet full liquid Room service appropriate? Yes; Fluid consistency: Thin  Diet effective now       Question Answer Comment  Room service appropriate? Yes   Fluid consistency: Thin      09/11/20 1515            Code Status: DNR    Subjective 09/18/20    Pt seen at bedside today.  Says she does not feel ready to be discharged yet, but unable to provide specific concerns or symptoms, just says "not up to it yet, maybe tomorrow".  No swallow issues, N/V, fever/chills, cough or SOB.  Disposition Plan & Communication   Status is: Inpatient  Remains inpatient appropriate because: pt lives alone, generally weak and requires placement for rehab.     Dispo: The patient is from: Home              Anticipated d/c is to: SNF              Anticipated d/c date is: pending SNF bed              Patient currently IS medically stable for discharge    Family Communication: son updated by phone 11/19, will attempt call this afternoon.   Consults, Procedures, Significant Events   Consultants:   Palliative  Care  Procedures:   None  Antimicrobials:  Anti-infectives (From admission, onward)   None         Objective   Vitals:   09/18/20 0004 09/18/20 0427 09/18/20 0755 09/18/20 1117  BP: 109/80 105/76 112/75 107/67  Pulse: 80 75 77 77  Resp: 17 17 16 16   Temp: 98.5 F (36.9 C) 98.3 F (36.8 C) 97.8 F (36.6 C) 98.4 F (36.9 C)  TempSrc: Oral Oral  Oral  SpO2: 100% 100% 100% 100%  Weight:      Height:        Intake/Output Summary (Last 24 hours) at 09/18/2020 1548 Last data filed at 09/18/2020 1300 Gross per 24 hour  Intake 480 ml  Output --  Net 480 ml   Filed Weights   09/15/20 0500 09/16/20 0500 09/17/20 0500  Weight: 40.4 kg 40.9 kg 39 kg    Physical Exam:  General exam: awake, alert,  no acute distress, cachectic and frail Respiratory system: CTAB, on room air, normal respiratory effort. Cardiovascular system: normal S1/S2, RRR, no peripheral edema Gastrointestinal system: soft, NT, ND, sunken/concave, bowel sounds present    Labs   Data Reviewed: I have personally reviewed following labs and imaging studies  CBC: Recent Labs  Lab 09/13/20 0540 09/14/20 0359 09/15/20 0409 09/16/20 0404 09/18/20 0445  WBC 2.6* 2.3* 3.1* 3.1* 3.4*  NEUTROABS  --   --   --   --  1.4*  HGB 9.8* 9.1* 9.5* 9.2* 9.7*  HCT 28.0* 26.0* 27.7* 26.5* 27.6*  MCV 90.6 91.2 91.4 91.4 91.1  PLT 140* 139* 147* 158 264   Basic Metabolic Panel: Recent Labs  Lab 09/13/20 0540 09/13/20 0540 09/14/20 0359 09/15/20 0409 09/16/20 0404 09/17/20 0346 09/18/20 0445  NA 131*   < > 133* 132* 130* 133* 132*  K 4.0   < > 3.9 3.9 4.0 4.1 4.2  CL 95*   < > 99 96* 97* 99 97*  CO2 27   < > 29 29 29 28 28   GLUCOSE 71   < > 77 78 84 79 80  BUN 8   < > 11 10 11 8 11   CREATININE 0.44   < > 0.51 0.45 0.46 0.46 0.49  CALCIUM 8.4*   < > 8.0* 8.5* 8.3* 8.1* 8.4*  MG 2.1  --  2.1 2.2  --   --   --   PHOS 3.6  --  3.2 3.2 3.6  --   --    < > = values in this interval not displayed.    GFR: Estimated Creatinine Clearance: 38 mL/min (by C-G formula based on SCr of 0.49 mg/dL). Liver Function Tests: No results for input(s): AST, ALT, ALKPHOS, BILITOT, PROT, ALBUMIN in the last 168 hours. No results for input(s): LIPASE, AMYLASE in the last 168 hours. No results for input(s): AMMONIA in the last 168 hours. Coagulation Profile: No results for input(s): INR, PROTIME in the last 168 hours. Cardiac Enzymes: No results for input(s): CKTOTAL, CKMB, CKMBINDEX, TROPONINI in the last 168 hours. BNP (last 3 results) No results for input(s): PROBNP in the last 8760 hours. HbA1C: No results for input(s): HGBA1C in the last 72 hours. CBG: No results for input(s): GLUCAP in the last 168 hours. Lipid Profile: No results for input(s): CHOL, HDL, LDLCALC, TRIG, CHOLHDL, LDLDIRECT in the last 72 hours. Thyroid Function Tests: No results for input(s): TSH, T4TOTAL, FREET4, T3FREE, THYROIDAB in the last 72 hours. Anemia Panel: No results for input(s): VITAMINB12, FOLATE, FERRITIN, TIBC, IRON, RETICCTPCT in the last 72 hours. Sepsis Labs: No results for input(s): PROCALCITON, LATICACIDVEN in the last 168 hours.  Recent Results (from the past 240 hour(s))  Respiratory Panel by RT PCR (Flu A&B, Covid) - Nasopharyngeal Swab     Status: None   Collection Time: 09/11/20  2:46 PM   Specimen: Nasopharyngeal Swab  Result Value Ref Range Status   SARS Coronavirus 2 by RT PCR NEGATIVE NEGATIVE Final    Comment: (NOTE) SARS-CoV-2 target nucleic acids are NOT DETECTED.  The SARS-CoV-2 RNA is generally detectable in upper respiratoy specimens during the acute phase of infection. The lowest concentration of SARS-CoV-2 viral copies this assay can detect is 131 copies/mL. A negative result does not preclude SARS-Cov-2 infection and should not be used as the sole basis for treatment or other patient management decisions. A negative result may occur with  improper specimen collection/handling,  submission of specimen other  than nasopharyngeal swab, presence of viral mutation(s) within the areas targeted by this assay, and inadequate number of viral copies (<131 copies/mL). A negative result must be combined with clinical observations, patient history, and epidemiological information. The expected result is Negative.  Fact Sheet for Patients:  PinkCheek.be  Fact Sheet for Healthcare Providers:  GravelBags.it  This test is no t yet approved or cleared by the [place] FDA and  has been authorized for detection and/or diagnosis of SARS-CoV-2 by FDA under an Emergency Use Authorization (EUA). This EUA will remain  in effect (meaning this test can be used) for the duration of the COVID-19 declaration under Section 564(b)(1) of the Act, 21 U.S.C. section 360bbb-3(b)(1), unless the authorization is terminated or revoked sooner.     Influenza A by PCR NEGATIVE NEGATIVE Final   Influenza B by PCR NEGATIVE NEGATIVE Final    Comment: (NOTE) The Xpert Xpress SARS-CoV-2/FLU/RSV assay is intended as an aid in  the diagnosis of influenza from Nasopharyngeal swab specimens and  should not be used as a sole basis for treatment. Nasal washings and  aspirates are unacceptable for Xpert Xpress SARS-CoV-2/FLU/RSV  testing.  Fact Sheet for Patients: PinkCheek.be  Fact Sheet for Healthcare Providers: GravelBags.it  This test is not yet approved or cleared by the [place] FDA and  has been authorized for detection and/or diagnosis of SARS-CoV-2 by  FDA under an Emergency Use Authorization (EUA). This EUA will remain  in effect (meaning this test can be used) for the duration of the  Covid-19 declaration under Section 564(b)(1) of the Act, 21  U.S.C. section 360bbb-3(b)(1), unless the authorization is  terminated or revoked. Performed at [facility], [address] [place], [place] [address]   Resp Panel by RT-PCR (Flu A&B, Covid) Nasopharyngeal Swab     Status: None   Collection Time: 09/17/20  5:58 PM   Specimen: Nasopharyngeal Swab; Nasopharyngeal(NP) swabs in vial transport medium  Result Value Ref Range Status   SARS Coronavirus 2 by RT PCR NEGATIVE NEGATIVE Final    Comment: (NOTE) SARS-CoV-2 target nucleic acids are NOT DETECTED.  The SARS-CoV-2 RNA is generally detectable in upper respiratory specimens during the acute phase of infection. The lowest concentration of SARS-CoV-2 viral copies this assay can detect is 138 copies/mL. A negative result does not preclude SARS-Cov-2 infection and should not be used as the sole basis for treatment or other patient management decisions. A negative result may occur with  improper specimen collection/handling, submission of specimen other than nasopharyngeal swab, presence of viral mutation(s) within the areas targeted by this assay, and inadequate number of viral copies(<138 copies/mL). A negative result must be combined with clinical observations, patient history, and epidemiological information. The expected result is Negative.  Fact Sheet for Patients:  EntrepreneurPulse.com.au  Fact Sheet for Healthcare Providers:  IncredibleEmployment.be  This test is no t yet approved or cleared by the [place] FDA and  has been authorized for detection and/or diagnosis of SARS-CoV-2 by FDA under an Emergency Use Authorization (EUA). This EUA will remain  in effect (meaning this test can be used) for the duration of the COVID-19 declaration under Section 564(b)(1) of the Act, 21 U.S.C.section 360bbb-3(b)(1), unless the authorization is terminated  or revoked sooner.       Influenza A by PCR NEGATIVE NEGATIVE Final   Influenza B by PCR NEGATIVE NEGATIVE Final    Comment: (NOTE) The Xpert Xpress SARS-CoV-2/FLU/RSV plus assay is intended as an  aid in the  diagnosis of influenza from Nasopharyngeal swab specimens and should not be used as a sole basis for treatment. Nasal washings and aspirates are unacceptable for Xpert Xpress SARS-CoV-2/FLU/RSV testing.  Fact Sheet for Patients: EntrepreneurPulse.com.au  Fact Sheet for Healthcare Providers: IncredibleEmployment.be  This test is not yet approved or cleared by the [place] FDA and has been authorized for detection and/or diagnosis of SARS-CoV-2 by FDA under an Emergency Use Authorization (EUA). This EUA will remain in effect (meaning this test can be used) for the duration of the COVID-19 declaration under Section 564(b)(1) of the Act, 21 U.S.C. section 360bbb-3(b)(1), unless the authorization is terminated or revoked.  Performed at [facility], [address], [place], [place] [address]       Imaging Studies   No results found.   Medications   Scheduled Meds: . clonazepam  0.125 mg Oral Daily  . enoxaparin (LOVENOX) injection  30 mg Subcutaneous Q24H  . feeding supplement  237 mL Oral TID BM  . influenza vaccine adjuvanted  0.5 mL Intramuscular Tomorrow-1000  . levothyroxine  88 mcg Oral QAC breakfast  . midodrine  2.5 mg Oral TID WC  . mirtazapine  15 mg Oral QHS  . [START ON 09/19/2020] multivitamin  15 mL Per Tube Daily  . pantoprazole sodium  40 mg Oral BID  . pneumococcal 23 valent vaccine  0.5 mL Intramuscular Tomorrow-1000  . sodium chloride  1 g Oral BID WC  . Vitamin D (Ergocalciferol)  50,000 Units Oral Q7 days   Continuous Infusions:      LOS: 7 days    Time spent: 15 minutes     Ezekiel Slocumb, DO [facility]  09/18/2020, 3:48 PM    If 7PM-7AM, please contact night-coverage. How to contact the [facility] Attending or Consulting provider [place] or covering provider during after hours [place], for this patient?    1. Check the care team in [facility] and look for a)  attending/consulting [facility] provider listed and b) the [facility] team listed 2. Log into [email or web address] and use [place]'s universal password to access. If you do not have the password, please contact the hospital operator. 3. Locate the [facility] provider you are looking for under [facility] and page to a number that you can be directly reached. 4. If you still have difficulty reaching the provider, please page the [facility] (Director on Call) for the Hospitalists listed on amion for assistance.

## 2020-09-18 NOTE — TOC Progression Note (Signed)
Transition of Care [facility]) - Progression Note    Patient Details  Name: Maureen [name] MRN: [id number] Date of Birth: 1946/06/23  Transition of [facility]) CM/SW Contact  Allayne Butcher, RN Phone Number: 09/18/2020, 3:08 PM  Clinical Narrative:    Insurance auth pending.  Patient is medically cleared for when Berkley Harvey is approved.    Expected Discharge Plan: Skilled Nursing Facility Barriers to Discharge: Continued Medical Work up  Expected Discharge Plan and Services Expected Discharge Plan: Skilled Nursing Facility In-house Referral: Clinical Social Work Discharge Planning Services: CM Consult Post Acute Care Choice: Skilled Nursing Facility Living arrangements for the past 2 months: Apartment                 DME Arranged: N/A DME Agency: NA       HH Arranged: NA HH Agency: NA         Social Determinants of Health (SDOH) Interventions    Readmission Risk Interventions No flowsheet data found.

## 2020-09-18 NOTE — Progress Notes (Addendum)
Nutrition Follow Up Note   DOCUMENTATION CODES:   Severe malnutrition in context of chronic illness, Underweight  INTERVENTION:   Continue Ensure Enlive po TID, each supplement provides 350 kcal and 20 grams of protein. Patient prefers vanilla or strawberry.  Continue Magic cup BID with lunch and dinner, each supplement provides 290 kcal and 9 grams of protein. Patient prefers vanilla.  Add liquid MVI po daily   NUTRITION DIAGNOSIS:   Severe Malnutrition related to chronic illness (dysphagia, anorexia) as evidenced by per patient/family report, severe fat depletion, severe muscle depletion, 34% weight loss over 1 year.  GOAL:   Patient will meet greater than or equal to 90% of their needs -met   MONITOR:   PO intake, Supplement acceptance, Labs, Weight trends, I & O's  ASSESSMENT:   74 year old female with PMHx of GERD, HLD, HTN, depression admitted with FTT, symptoms of dysphagia and anorexia.   Spoke with RN, pt continues to have good appetite and oral intake; pt eating 100% of her meals in hospital and is drinking Ensure supplements. Forty eight hour calorie count completed 11/19 and pt noted to be meeting 100% of her estimated needs. Recommend continue supplements after discharge to help pt meet her estimated needs. Per chart, pt is weight stable since admit and appears to be at her UBW. Plan is for SNF at discharge; insurance auth pending.  Medications reviewed and include: lovenox, synthroid, remeron, protonix, vitamin D  Labs reviewed: Na 132(L) P 3.6 wnl- 11/21 Wbc- 3.4(L), Hgb 9.7(L), Hct 27.6(L)  Diet Order:    Diet Order            Diet full liquid Room service appropriate? Yes; Fluid consistency: Thin  Diet effective now                EDUCATION NEEDS:   No education needs have been identified at this time  Skin:  Skin Assessment: Reviewed RN Assessment  Last BM:  11/22- type 3  Height:   Ht Readings from Last 1 Encounters:  09/11/20 5' 1"   (1.549 m)   Weight:   Wt Readings from Last 1 Encounters:  09/17/20 39 kg   Ideal Body Weight:  47.8 kg  BMI:  Body mass index is 16.25 kg/m.  Estimated Nutritional Needs:   Kcal:  1400-1600  Protein:  70-80 grams  Fluid:  1.2-1.5 L/day  Koleen Distance MS, RD, LDN Please refer to [facility] for RD and/or RD on-call/weekend/after hours pager

## 2020-09-18 NOTE — Progress Notes (Signed)
Physical Therapy Treatment Patient Details Name: Maureen [name] MRN: [id number] DOB: 1946-08-16 Today's Date: 09/18/2020    History of Present Illness 74 y.o. female with pertinent past medical history of chronic weight loss due to dysphagia from esophageal dysmotility and likely avoidant restrictive food intake disorder who presented to the ED on 09/11/20 on referral from primary care due to abnormal labs, progressive weakness and decreased PO intake.    PT Comments    Pt was pleasant and agreed to participate with PT services with min encouragement.  Pt required min A for BLE management during sit to supine and required increased effort to come to standing from an elevated EOB but was steady with use of a SPC.  During ambulation pt took very small, cautious steps and required min A on one occasion to prevent L lateral LOB but otherwise was steady without other instances of LOB.  Pt's SpO2 was 100% on room air after amb with HR in the 80s with no adverse symptoms noted by patient.  Pt will benefit from PT services in a SNF setting upon discharge to safely address deficits listed in patient problem list for decreased caregiver assistance and risk of further functional decline.     Follow Up Recommendations  SNF     Equipment Recommendations  Other (comment) (TBD at next venue of care)    Recommendations for Other Services       Precautions / Restrictions Precautions Precautions: Fall Restrictions Weight Bearing Restrictions: No    Mobility  Bed Mobility Overal bed mobility: Needs Assistance Bed Mobility: Supine to Sit;Sit to Supine     Supine to sit: Supervision Sit to supine: Min assist   General bed mobility comments: Min A for BLE's back into bed during sit to sup, extra time and effort only during sup to sit  Transfers Overall transfer level: Needs assistance Equipment used: Straight cane Transfers: Sit to/from Stand Sit to Stand: Supervision Stand pivot transfers:  Supervision       General transfer comment: Fair eccentric and concentric control and stability with min extra effort to stand  Ambulation/Gait Ambulation/Gait assistance: Min assist Gait Distance (Feet): 40 Feet Assistive device: Straight cane Gait Pattern/deviations: Step-through pattern;Decreased step length - right;Decreased step length - left Gait velocity: decreased   General Gait Details: Very slow cadence with short B step length and min A on one occasion to prevent L lateral LOB   Stairs             Wheelchair Mobility    Modified Rankin (Stroke Patients Only)       Balance Overall balance assessment: Needs assistance Sitting-balance support: No upper extremity supported;Feet supported Sitting balance-Leahy Scale: Good Sitting balance - Comments: Steady static/dynamic sitting, reaching within BOS.   Standing balance support: During functional activity;Single extremity supported Standing balance-Leahy Scale: Poor Standing balance comment: Min A to prevent LOB in standing while ambulating with a SPC                            Cognition Arousal/Alertness: Awake/alert Behavior During Therapy: WFL for tasks assessed/performed Overall Cognitive Status: Within Functional Limits for tasks assessed                                        Exercises Total Joint Exercises Ankle Circles/Pumps: AROM;Strengthening;Both;5 reps;10 reps Quad Sets: Strengthening;Both;10 reps Long Arc  Quad: AROM;Strengthening;Both;10 reps Other Exercises Other Exercises: OT facilitates toilet transfer to/from [facility], functional mobility in room, seated UB/LB wash-up, UB/LB dressing tasks with cueing for safety and assist provided t/o session. See ADL section for additional detail regarding occupational performance.    General Comments        Pertinent Vitals/Pain Pain Assessment: No/denies pain    Home Living                      Prior Function             PT Goals (current goals can now be found in the care plan section) Acute Rehab PT Goals Patient Stated Goal: to get stronger Progress towards PT goals: Progressing toward goals    Frequency    Min 2X/week      PT Plan Current plan remains appropriate    Co-evaluation              AM-PAC PT "6 Clicks" Mobility   Outcome Measure  Help needed turning from your back to your side while in a flat bed without using bedrails?: None Help needed moving from lying on your back to sitting on the side of a flat bed without using bedrails?: A Little Help needed moving to and from a bed to a chair (including a wheelchair)?: A Little Help needed standing up from a chair using your arms (e.g., wheelchair or bedside chair)?: A Little Help needed to walk in hospital room?: A Little Help needed climbing 3-5 steps with a railing? : A Little 6 Click Score: 19    End of Session Equipment Utilized During Treatment: Gait belt Activity Tolerance: Patient tolerated treatment well Patient left: in bed;with call bell/phone within reach;Other (comment) (No bed alarm needed per nsg) Nurse Communication: Mobility status PT Visit Diagnosis: Muscle weakness (generalized) (M62.81);Difficulty in walking, not elsewhere classified (R26.2)     Time: 5361-4431 PT Time Calculation (min) (ACUTE ONLY): 16 min  Charges:  $Gait Training: 8-22 mins                     D. Elly Modena PT, DPT 09/18/20, 4:34 PM

## 2020-09-19 ENCOUNTER — Ambulatory Visit: Payer: Medicare HMO | Admitting: *Deleted

## 2020-09-19 DIAGNOSIS — D72819 Decreased white blood cell count, unspecified: Secondary | ICD-10-CM | POA: Diagnosis not present

## 2020-09-19 DIAGNOSIS — R279 Unspecified lack of coordination: Secondary | ICD-10-CM | POA: Diagnosis not present

## 2020-09-19 DIAGNOSIS — E039 Hypothyroidism, unspecified: Secondary | ICD-10-CM | POA: Diagnosis not present

## 2020-09-19 DIAGNOSIS — F5082 Avoidant/restrictive food intake disorder: Secondary | ICD-10-CM | POA: Diagnosis not present

## 2020-09-19 DIAGNOSIS — R627 Adult failure to thrive: Secondary | ICD-10-CM | POA: Diagnosis not present

## 2020-09-19 DIAGNOSIS — I10 Essential (primary) hypertension: Secondary | ICD-10-CM | POA: Diagnosis not present

## 2020-09-19 DIAGNOSIS — F325 Major depressive disorder, single episode, in full remission: Secondary | ICD-10-CM | POA: Diagnosis not present

## 2020-09-19 DIAGNOSIS — D649 Anemia, unspecified: Secondary | ICD-10-CM | POA: Diagnosis not present

## 2020-09-19 DIAGNOSIS — K224 Dyskinesia of esophagus: Secondary | ICD-10-CM | POA: Diagnosis not present

## 2020-09-19 DIAGNOSIS — Z515 Encounter for palliative care: Secondary | ICD-10-CM | POA: Diagnosis not present

## 2020-09-19 DIAGNOSIS — K222 Esophageal obstruction: Secondary | ICD-10-CM | POA: Diagnosis not present

## 2020-09-19 DIAGNOSIS — I959 Hypotension, unspecified: Secondary | ICD-10-CM | POA: Diagnosis not present

## 2020-09-19 DIAGNOSIS — E43 Unspecified severe protein-calorie malnutrition: Secondary | ICD-10-CM | POA: Diagnosis not present

## 2020-09-19 DIAGNOSIS — E871 Hypo-osmolality and hyponatremia: Secondary | ICD-10-CM | POA: Diagnosis not present

## 2020-09-19 DIAGNOSIS — R1319 Other dysphagia: Secondary | ICD-10-CM | POA: Diagnosis not present

## 2020-09-19 DIAGNOSIS — R630 Anorexia: Secondary | ICD-10-CM | POA: Diagnosis not present

## 2020-09-19 DIAGNOSIS — R1314 Dysphagia, pharyngoesophageal phase: Secondary | ICD-10-CM | POA: Diagnosis not present

## 2020-09-19 DIAGNOSIS — M6281 Muscle weakness (generalized): Secondary | ICD-10-CM | POA: Diagnosis not present

## 2020-09-19 DIAGNOSIS — Z7189 Other specified counseling: Secondary | ICD-10-CM | POA: Diagnosis not present

## 2020-09-19 DIAGNOSIS — R5381 Other malaise: Secondary | ICD-10-CM | POA: Diagnosis not present

## 2020-09-19 DIAGNOSIS — I4891 Unspecified atrial fibrillation: Secondary | ICD-10-CM | POA: Diagnosis not present

## 2020-09-19 LAB — RESP PANEL BY RT-PCR (FLU A&B, COVID) ARPGX2
Influenza A by PCR: NEGATIVE
Influenza B by PCR: NEGATIVE
SARS Coronavirus 2 by RT PCR: NEGATIVE

## 2020-09-19 LAB — BASIC METABOLIC PANEL
Anion gap: 9 (ref 5–15)
BUN: 9 mg/dL (ref 8–23)
CO2: 28 mmol/L (ref 22–32)
Calcium: 8.6 mg/dL — ABNORMAL LOW (ref 8.9–10.3)
Chloride: 97 mmol/L — ABNORMAL LOW (ref 98–111)
Creatinine, Ser: 0.51 mg/dL (ref 0.44–1.00)
GFR, Estimated: >60 mL/min (ref >60)
Glucose, Bld: 73 mg/dL (ref 70–99)
Potassium: 4.1 mmol/L (ref 3.5–5.1)
Sodium: 134 mmol/L — ABNORMAL LOW (ref 135–145)

## 2020-09-19 MED ORDER — CLONAZEPAM 0.125 MG PO TBDP: 0.1250 mg | ORAL_TABLET | Freq: Every day | ORAL | 0 refills | Status: DC

## 2020-09-19 MED ORDER — SODIUM CHLORIDE 1 G PO TABS
1.0000 g | ORAL_TABLET | Freq: Two times a day (BID) | ORAL | Status: DC
Start: 2020-09-19 — End: 2021-03-20

## 2020-09-19 MED ORDER — VITAMIN D (ERGOCALCIFEROL) 1.25 MG (50000 UNIT) PO CAPS: 50000.0000 [IU] | ORAL_CAPSULE | ORAL | Status: DC

## 2020-09-19 MED ORDER — MIDODRINE HCL 2.5 MG PO TABS
2.5000 mg | ORAL_TABLET | Freq: Three times a day (TID) | ORAL | Status: DC
Start: 2020-09-19 — End: 2020-10-15

## 2020-09-19 NOTE — TOC Progression Note (Addendum)
Transition of Care [facility]) - Progression Note    Patient Details  Name: Maureen [name] MRN: [id number] Date of Birth: 05-21-1946  [facility] CM/SW Contact  [name], LCSW Phone Number: 09/19/2020, 9:33 AM  Clinical Narrative:   CSW reached out to [place] with [employer], inquired about insurance auth status. Waiting to hear back.  9:40- Spoke with Verlon Au, she needs updated PT/OT notes. Sent notes in Epic. Verlon Au confirmed she received them and will send them to [facility] now. She will update CSW when she gets authorization.  12:00- Updated by Verlon Au that she has insurance authorization for patient. Updated MD and RN.    Expected Discharge Plan: Skilled Nursing Facility Barriers to Discharge: Continued Medical Work up  Expected Discharge Plan and Services Expected Discharge Plan: Skilled Nursing Facility In-house Referral: Clinical Social Work Discharge Planning Services: CM Consult Post Acute Care Choice: Skilled Nursing Facility Living arrangements for the past 2 months: Apartment                 DME Arranged: N/A DME Agency: NA       HH Arranged: NA HH Agency: NA         Social Determinants of Health (SDOH) Interventions    Readmission Risk Interventions No flowsheet data found.

## 2020-09-19 NOTE — Progress Notes (Signed)
Pt oof in stable condition via EMS

## 2020-09-19 NOTE — Discharge Summary (Addendum)
Physician Discharge Summary  [name]:[id number] DOB: [birth year] DOA: 09/11/2020  PCP: Alba Cory, MD  Admit date: 09/11/2020 Discharge date: 09/19/2020  Admitted From: Home  Discharge disposition: SNF   Recommendations for Outpatient Follow-Up:   . Follow up with your primary care provider at the skilled nursing facility in 3 to 5 days. . Check CBC, BMP, magnesium in the next visit . Encourage on nutrition.  Currently on full liquid diet.  Would benefit from nutrition follow-up . DC salt tablets in the next visit with BMP report if sodium is reasonably improved. . Please consider checking TSH in 4 to 6 weeks.  Discharge Diagnosis:   Principal Problem:   Failure to thrive in adult Active Problems:   Hypothyroidism (acquired)   Hypertension   Dysphagia   Avoidant-restrictive food intake disorder (ARFID)   Protein-calorie malnutrition, severe   Esophageal motility disorder   Palliative care by specialist   Advanced care planning/counseling discussion   Discharge Condition: Improved.  Diet recommendation: Full liquid diet  Wound care: None.  Code status: DNR   History of Present Illness:  Maureen [name] a 74 y.o.femalewith pertinent past medical history of chronic weight loss due to dysphagia from esophageal dysmotility and likely avoidant restrictive food intake disorder who presented to the ED on 09/11/20 on referral from primary care due to abnormal labs drawn 4 days prior.  Patient denied any specific complaints at time of admission other than generalized weakness.  Her son reported that patient has been progressively weak and had minimal PO intake aside from Ensure.  Patient was then admitted to hospital for further evaluation and treatment.  Hospital Course:   Following conditions were addressed during hospitalization as listed below,  Failure to thrive in adult -history of chronic and progressive weight loss and generalized weakness due  to essential dysmotility and restrictive food intake disorder.  Pt with 1 year (or longer) history of dysphagia and resultant fear of swallowing.    Patient was seen by GI while in the hospital and also by palliative care.  Patient has been starting to eat full liquid diet and calorie intake is adequate at this time.  Dietitian primary care and GI on board do not recommend PEG tube placement at this time.  Continue Remeron on discharge.  Severe protein calorie malnutrition -present on admission.  Dietician following.  Calories counted x 2 days and pt getting adequate intake with full liquid diet and supplement drinks.  Dysphagia due to esophageal dysmotility - Prior EGD showed non-obstructing Schaztki's ring.  Avoidant-Restrictive Food Intake Disorders - due to anxiety and fear with swallowing as result of dysphagia / choking episodes. Imaging this admission supports recurrent / chronic dysphagia, showing "Complete collapse of the right lower lobe with occlusion of the a right lower lobar pulmonary bronchus suggesting and endobronchial mass such as aspirated debris or mucous plug. Patulous esophagus demonstrating an air-fluid level. This may reflect changes of gastroesophageal reflux or esophageal dysmotility.  Patient did not have any respiratory symptoms or signs of infection.  Generalized weakness - PT recommended SNF for rehab, pt agreeable.    Hyponatremia -mild.  Secondary to hypovolemia.  Has improved.  Has underlying hypothyroidism. AM Cortisol normal.  On salt tablets. Consider discontinuation in few days.  Hypotension - started on low dose midodrine 2.5 mg TID WC.  Cortisol level normal.  No signs of infection.  Likely baseline BP.   On salt tablet as well.  Vitamin D Deficiency - started on 50k  units weekly until level >30.  Follow-up as outpatient.  Leukopenia - POA, chronic.  Referred to hematology by PCP.  On chart review, evaluation has been unremarkable.  Hx of  Depression / Anxiety - Continue Remeron, PRN Klonopin  Hypothyroidism - TSH quite elevated on admission due to noncompliance with Synthroid.   Resume Synthroid at prescribed dose.  Follow up TSH in about 4-6 weeks.   Normocytic anemia - Hbg stable in 9's.  No signs of bleeding.  Iron levels normal except slightly low TIBC, folate and B12 are normal.  Check FOBT - pending.  Suspect anemia of chronic disease.  History of paroxysmal A-fib - rate controlled.  Not on anticoagulation or nodal blockers.  Currently sinus rhythm  Disposition.  At this time, patient is stable for disposition to skilled nursing facility.  Medical Consultants:    Palliative care  GI  Procedures:    None Subjective:   Today, patient patient was seen and examined at bedside.  Patient states that she has been able to eat liquid diet.  Denies any pain, nausea, vomiting.  Discharge Exam:   Vitals:   09/19/20 0833 09/19/20 1059  BP: 111/76 93/65  Pulse: 74 74  Resp: 18 18  Temp: 98.5 F (36.9 C) 98.1 F (36.7 C)  SpO2: 100% 100%   Vitals:   09/19/20 0008 09/19/20 0424 09/19/20 0833 09/19/20 1059  BP: 108/72 95/62 111/76 93/65  Pulse: 72 74 74 74  Resp: Temp: 98.3 F (36.8 C) 98.3 F (36.8 C) 98.5 F (36.9 C) 98.1 F (36.7 C)  TempSrc: Oral  Oral Oral  SpO2: 100% 100% 100% 100%  Weight:  40.5 kg    Height:       General: Alert awake, not in obvious distress, thinly built, cachectic HENT: pupils equally reacting to light,  No scleral pallor or icterus noted. Oral mucosa is moist.  Chest:  .  Diminished breath sounds bilaterally.  CVS: S1 &S2 heard. No murmur.  Regular rate and rhythm. Abdomen: Soft, nontender, nondistended.  Bowel sounds are heard.   Extremities: No cyanosis, clubbing or edema.  Peripheral pulses are palpable. Psych: Alert, awake and oriented, normal mood CNS:  No cranial nerve deficits.  Power equal in all extremities.  Generalized weakness noted Skin: Warm  and dry.  No rashes noted.  The results of significant diagnostics from this hospitalization (including imaging, microbiology, ancillary and laboratory) are listed below for reference.     Diagnostic Studies:   CT CHEST W CONTRAST  Result Date: 09/11/2020 CLINICAL DATA:  Unintentional weight loss, anorexia, dysphagia EXAM: CT CHEST WITH CONTRAST TECHNIQUE: Multidetector CT imaging of the chest was performed during intravenous contrast administration. CONTRAST:  60mL OMNIPAQUE IOHEXOL 300 MG/ML  SOLN COMPARISON:  None. FINDINGS: Cardiovascular: There is extensive multi-vessel coronary artery calcification. Global cardiac size is mildly enlarged. No pericardial effusion. Central pulmonary arteries are of normal caliber. Mild atherosclerotic calcification within the thoracic aorta. No aortic aneurysm. Mediastinum/Nodes: No pathologic thoracic adenopathy. The esophagus appears minimally dilated within its mid segment and demonstrates an air-fluid level suggesting changes of underlying esophageal dysmotility or gastroesophageal reflux. Lungs/Pleura: Mild centrilobular emphysema. There is complete collapse of the right lower lobe, new since prior examination, with small associated right pleural effusion. There is abrupt occlusion of the right lower lobar bronchus suggesting an endobronchial mass such as a mucous plug or aspirated debris. Lungs are otherwise clear. No pneumothorax. No pleural effusion on the left Upper Abdomen: No acute  abnormality. Musculoskeletal: Marked thoracic dextroscoliosis results in mild deformity of the thoracic cage, unchanged from prior examination. Accentuated thoracic kyphosis noted mild degenerative changes are noted along the concavity. No lytic or blastic bone lesions are seen. IMPRESSION: Complete collapse of the right lower lobe with occlusion of the a right lower lobar pulmonary bronchus suggesting and endobronchial mass such as aspirated debris or mucous plug. Correlation  with bronchoscopy may be helpful. Patulous esophagus demonstrating an air-fluid level. This may reflect changes of gastroesophageal reflux or esophageal dysmotility. Extensive multi-vessel coronary artery calcification. Aortic Atherosclerosis (ICD10-I70.0) and Emphysema (ICD10-J43.9). Electronically Signed   By: Helyn NumbersAshesh  Parikh MD   On: 09/11/2020 22:35     Labs:   Basic Metabolic Panel: Recent Labs  Lab 09/13/20 0540 09/13/20 0540 09/14/20 [id number] 09/14/20 [id number] 09/15/20 0409 09/15/20 0409 09/16/20 0404 09/16/20 0404 09/17/20 0346 09/17/20 0346 09/18/20 0445 09/19/20 0415  NA 131*   < > 133*   < > 132*  --  130*  --  133*  --  132* 134*  K 4.0   < > 3.9   < > 3.9   < > 4.0   < > 4.1   < > 4.2 4.1  CL 95*   < > 99   < > 96*  --  97*  --  99  --  97* 97*  CO2 27   < > 29   < > 29  --  29  --  28  --  28 28  GLUCOSE 71   < > 77   < > 78  --  84  --  79  --  80 73  BUN 8   < > 11   < > 10  --  11  --  8  --  11 9  CREATININE 0.44   < > 0.51   < > 0.45  --  0.46  --  0.46  --  0.49 0.51  CALCIUM 8.4*   < > 8.0*   < > 8.5*  --  8.3*  --  8.1*  --  8.4* 8.6*  MG 2.1  --  2.1  --  2.2  --   --   --   --   --   --   --   PHOS 3.6  --  3.2  --  3.2  --  3.6  --   --   --   --   --    < > = values in this interval not displayed.   GFR Estimated Creatinine Clearance: 39.4 mL/min (by C-G formula based on SCr of 0.51 mg/dL). Liver Function Tests: No results for input(s): AST, ALT, ALKPHOS, BILITOT, PROT, ALBUMIN in the last 168 hours. No results for input(s): LIPASE, AMYLASE in the last 168 hours. No results for input(s): AMMONIA in the last 168 hours. Coagulation profile No results for input(s): INR, PROTIME in the last 168 hours.  CBC: Recent Labs  Lab 09/13/20 0540 09/14/20 0359 09/15/20 0409 09/16/20 0404 09/18/20 0445  WBC 2.6* 2.3* 3.1* 3.1* 3.4*  NEUTROABS  --   --   --   --  1.4*  HGB 9.8* 9.1* 9.5* 9.2* 9.7*  HCT 28.0* 26.0* 27.7* 26.5* 27.6*  MCV 90.6 91.2 91.4 91.4 91.1   PLT 140* 139* 147* 158 176   Cardiac Enzymes: No results for input(s): CKTOTAL, CKMB, CKMBINDEX, TROPONINI in the last 168 hours. BNP: Invalid input(s): POCBNP CBG: No results for input(s): GLUCAP in  the last 168 hours. D-Dimer No results for input(s): DDIMER in the last 72 hours. Hgb A1c No results for input(s): HGBA1C in the last 72 hours. Lipid Profile No results for input(s): CHOL, HDL, LDLCALC, TRIG, CHOLHDL, LDLDIRECT in the last 72 hours. Thyroid function studies No results for input(s): TSH, T4TOTAL, T3FREE, THYROIDAB in the last 72 hours.  Invalid input(s): FREET3 Anemia work up No results for input(s): VITAMINB12, FOLATE, FERRITIN, TIBC, IRON, RETICCTPCT in the last 72 hours. Microbiology Recent Results (from the past 240 hour(s))  Respiratory Panel by RT PCR (Flu A&B, Covid) - Nasopharyngeal Swab     Status: None   Collection Time: 09/11/20  2:46 PM   Specimen: Nasopharyngeal Swab  Result Value Ref Range Status   SARS Coronavirus 2 by RT PCR NEGATIVE NEGATIVE Final    Comment: (NOTE) SARS-CoV-2 target nucleic acids are NOT DETECTED.  The SARS-CoV-2 RNA is generally detectable in upper respiratoy specimens during the acute phase of infection. The lowest concentration of SARS-CoV-2 viral copies this assay can detect is 131 copies/mL. A negative result does not preclude SARS-Cov-2 infection and should not be used as the sole basis for treatment or other patient management decisions. A negative result may occur with  improper specimen collection/handling, submission of specimen other than nasopharyngeal swab, presence of viral mutation(s) within the areas targeted by this assay, and inadequate number of viral copies (<131 copies/mL). A negative result must be combined with clinical observations, patient history, and epidemiological information. The expected result is Negative.  Fact Sheet for Patients:  https://www.moore.com/  Fact Sheet for  Healthcare Providers:  https://www.young.biz/  This test is no t yet approved or cleared by the [place] FDA and  has been authorized for detection and/or diagnosis of SARS-CoV-2 by FDA under an Emergency Use Authorization (EUA). This EUA will remain  in effect (meaning this test can be used) for the duration of the COVID-19 declaration under Section 564(b)(1) of the Act, 21 U.S.C. section 360bbb-3(b)(1), unless the authorization is terminated or revoked sooner.     Influenza A by PCR NEGATIVE NEGATIVE Final   Influenza B by PCR NEGATIVE NEGATIVE Final    Comment: (NOTE) The Xpert Xpress SARS-CoV-2/FLU/RSV assay is intended as an aid in  the diagnosis of influenza from Nasopharyngeal swab specimens and  should not be used as a sole basis for treatment. Nasal washings and  aspirates are unacceptable for Xpert Xpress SARS-CoV-2/FLU/RSV  testing.  Fact Sheet for Patients: https://www.moore.com/  Fact Sheet for Healthcare Providers: https://www.young.biz/  This test is not yet approved or cleared by the [place] FDA and  has been authorized for detection and/or diagnosis of SARS-CoV-2 by  FDA under an Emergency Use Authorization (EUA). This EUA will remain  in effect (meaning this test can be used) for the duration of the  Covid-19 declaration under Section 564(b)(1) of the Act, 21  U.S.C. section 360bbb-3(b)(1), unless the authorization is  terminated or revoked. Performed at [facility], [address] [place], [place] [address]   Resp Panel by RT-PCR (Flu A&B, Covid) Nasopharyngeal Swab     Status: None   Collection Time: 09/17/20  5:58 PM   Specimen: Nasopharyngeal Swab; Nasopharyngeal(NP) swabs in vial transport medium  Result Value Ref Range Status   SARS Coronavirus 2 by RT PCR NEGATIVE NEGATIVE Final    Comment: (NOTE) SARS-CoV-2 target nucleic acids are NOT DETECTED.  The SARS-CoV-2 RNA is  generally detectable in upper respiratory specimens during the acute phase of infection.  The lowest concentration of SARS-CoV-2 viral copies this assay can detect is 138 copies/mL. A negative result does not preclude SARS-Cov-2 infection and should not be used as the sole basis for treatment or other patient management decisions. A negative result may occur with  improper specimen collection/handling, submission of specimen other than nasopharyngeal swab, presence of viral mutation(s) within the areas targeted by this assay, and inadequate number of viral copies(<138 copies/mL). A negative result must be combined with clinical observations, patient history, and epidemiological information. The expected result is Negative.  Fact Sheet for Patients:  BloggerCourse.com  Fact Sheet for Healthcare Providers:  SeriousBroker.it  This test is no t yet approved or cleared by the [place] FDA and  has been authorized for detection and/or diagnosis of SARS-CoV-2 by FDA under an Emergency Use Authorization (EUA). This EUA will remain  in effect (meaning this test can be used) for the duration of the COVID-19 declaration under Section 564(b)(1) of the Act, 21 U.S.C.section 360bbb-3(b)(1), unless the authorization is terminated  or revoked sooner.       Influenza A by PCR NEGATIVE NEGATIVE Final   Influenza B by PCR NEGATIVE NEGATIVE Final    Comment: (NOTE) The Xpert Xpress SARS-CoV-2/FLU/RSV plus assay is intended as an aid in the diagnosis of influenza from Nasopharyngeal swab specimens and should not be used as a sole basis for treatment. Nasal washings and aspirates are unacceptable for Xpert Xpress SARS-CoV-2/FLU/RSV testing.  Fact Sheet for Patients: BloggerCourse.com  Fact Sheet for Healthcare Providers: SeriousBroker.it  This test is not yet approved or cleared by the [place] FDA and has been authorized for detection and/or diagnosis of SARS-CoV-2 by FDA under an Emergency Use Authorization (EUA). This EUA will remain in effect (meaning this test can be used) for the duration of the COVID-19 declaration under Section 564(b)(1) of the Act, 21 U.S.C. section 360bbb-3(b)(1), unless the authorization is terminated or revoked.  Performed at [facility], [address], [place], [place] [address]      Discharge Instructions:   Discharge Instructions    Diet general   Complete by: As directed    Full liquids and advance as tolerated   Discharge instructions   Complete by: As directed    Follow up with your primary care provider at the skilled nursing facility in 3 to 5 days.   Increase activity slowly   Complete by: As directed      Allergies as of 09/19/2020      Reactions   Aspirin    Tinnitus   Citalopram Other (See Comments)   Codeine Nausea And Vomiting   Penicillins Rash   Has patient had a PCN reaction causing immediate rash, facial/tongue/throat swelling, SOB or lightheadedness with hypotension: Yes Has patient had a PCN reaction causing severe rash involving mucus membranes or skin necrosis: No Has patient had a PCN reaction that required hospitalization No Has patient had a PCN reaction occurring within the last 10 years: No If all of the above answers are "NO", then may proceed with Cephalosporin use.      Medication List    TAKE these medications   clonazepam 0.125 MG disintegrating tablet Commonly known as: KLONOPIN Take 1 tablet (0.125 mg total) by mouth daily.   Ensure Take 237 mLs by mouth. Pt drinking approx 3 ensures per day   GERITOL COMPLETE PO Take 1 tablet by mouth daily.   levothyroxine 88 MCG tablet Commonly known as: SYNTHROID Take 1 tablet (88 mcg total)  by mouth daily before breakfast.   midodrine 2.5 MG tablet Commonly known as: PROAMATINE Take 1 tablet (2.5 mg total) by mouth 3 (three)  times daily with meals.   mirtazapine 15 MG tablet Commonly known as: REMERON 7.5 mg at night for two weeks, then 15 mg at night   omeprazole 40 MG capsule Commonly known as: PRILOSEC Take 1 capsule (40 mg total) by mouth daily before breakfast.   sodium chloride 1 g tablet Take 1 tablet (1 g total) by mouth 2 (two) times daily with a meal.   Vitamin D (Ergocalciferol) 1.25 MG (50000 UNIT) Caps capsule Commonly known as: DRISDOL Take 1 capsule (50,000 Units total) by mouth every 7 (seven) days. Start taking on: September 21, 2020       Contact information for after-discharge care    Destination    [facility] .   Service: Skilled Nursing Contact information: [address] [place] [address] [phone]                   Time coordinating discharge: 39 minutes  Signed:  Hargun Spurling  [facility] 09/19/2020, 12:17 PM

## 2020-09-19 NOTE — Chronic Care Management (AMB) (Signed)
  Chronic Care Management   Social Work Note  09/19/2020 Name: [name] MRN: [id number] DOB: Oct 27, 1946  Maureen [name] is a 74 y.o. year old female who sees Alba Cory, MD for primary care. The CCM team was consulted for assistance with Level of Care Concerns.   Phone call to patient's son Verner Chol. Patient remains hospitalized with plan to transition to a SNF once insurance authorization is received. Per patient's son, he would like to monitor patient's progress before a long term plan is made. [name] continues to work on applying for [employer] on patient's behalf.  SDOH (Social Determinants of Health) assessments performed: No     Facility-Administered Encounter Medications as of 09/19/2020  Medication  . clonazepam (KLONOPIN) disintegrating tablet 0.125 mg  . enoxaparin (LOVENOX) injection 30 mg  . feeding supplement (ENSURE ENLIVE / ENSURE PLUS) liquid 237 mL  . influenza vaccine adjuvanted (FLUAD) injection 0.5 mL  . levothyroxine (SYNTHROID) tablet 88 mcg  . midodrine (PROAMATINE) tablet 2.5 mg  . mirtazapine (REMERON) tablet 15 mg  . multivitamin liquid 15 mL  . pneumococcal 23 valent vaccine (PNEUMOVAX-23) injection 0.5 mL  . sodium chloride tablet 1 g  . Vitamin D (Ergocalciferol) (DRISDOL) capsule 50,000 Units   Outpatient Encounter Medications as of 09/19/2020  Medication Sig Note  . clonazepam (KLONOPIN) 0.125 MG disintegrating tablet Take 1 tablet (0.125 mg total) by mouth 2 (two) times daily. (Patient taking differently: Take 0.125 mg by mouth daily. ) 09/11/2020: Patient has been out of medication since last week  . Ensure (ENSURE) Take 237 mLs by mouth. Pt drinking approx 3 ensures per day   . Iron-Vitamins (GERITOL COMPLETE PO) Take 1 tablet by mouth daily. (Patient not taking: Reported on 05/29/2020)   . levothyroxine (SYNTHROID) 88 MCG tablet Take 1 tablet (88 mcg total) by mouth daily before breakfast.   . mirtazapine (REMERON) 15 MG tablet 7.5 mg  at night for two weeks, then 15 mg at night (Patient not taking: Reported on 05/29/2020)   . omeprazole (PRILOSEC) 40 MG capsule Take 1 capsule (40 mg total) by mouth daily before breakfast.     Goals Addressed   None     Follow Up Plan: SW will follow up with patient's son by phone over the next 2-3 weeks  Lynkin Saini, LCSW Clinical Social Worker  [facility] Care Management [phone]

## 2020-09-19 NOTE — Care Management Important Message (Signed)
Important Message  Patient Details  Name: Maureen [name] MRN: [id number] Date of Birth: Feb 18, 1946   Medicare Important Message Given:  Yes     [name] [name] 09/19/2020, 11:22 AM

## 2020-09-19 NOTE — TOC Transition Note (Signed)
Transition of Care [facility]) - CM/SW Discharge Note   Patient Details  Name: Maureen [name] MRN: [id number] Date of Birth: 11/03/1945  Transition of Care [facility] CM/SW Contact:  [name], LCSW Phone Number: 09/19/2020, 1:04 PM   Clinical Narrative:   Patient to discharge to [facility] today, Room 506. Confirmed with Verlon Au at [employer]. CSW updated MD, RN, patient/family. Asked RN to call report and MD to submit DC Summary. Medical Necessity Form, DNR, and Face Sheet placed in Discharge Packet by patient chart. MD ordered rapid COVID test per RN at [facility] request. [facility] transport arranged for 5:00 pick up time. No other needs identified prior to discharge.     Final next level of care: Skilled Nursing Facility Barriers to Discharge: Barriers Resolved   Patient Goals and CMS Choice Patient states their goals for this hospitalization and ongoing recovery are:: SNF rehab CMS Medicare.gov Compare Post Acute Care list provided to:: Patient Choice offered to / list presented to : Patient  Discharge Placement              Patient chooses bed at: [facility] Patient to be transferred to facility by: [facility] Name of family member notified: Patient notified. Patient and family notified of of transfer: 09/19/20  Discharge Plan and Services In-house Referral: Clinical Social Work Discharge Planning Services: CM Consult Post Acute Care Choice: Skilled Nursing Facility          DME Arranged: N/A DME Agency: NA       HH Arranged: NA HH Agency: NA        Social Determinants of Health (SDOH) Interventions     Readmission Risk Interventions No flowsheet data found.

## 2020-09-24 DIAGNOSIS — I959 Hypotension, unspecified: Secondary | ICD-10-CM | POA: Diagnosis not present

## 2020-09-24 DIAGNOSIS — F325 Major depressive disorder, single episode, in full remission: Secondary | ICD-10-CM | POA: Diagnosis not present

## 2020-09-24 DIAGNOSIS — I4891 Unspecified atrial fibrillation: Secondary | ICD-10-CM | POA: Diagnosis not present

## 2020-09-24 DIAGNOSIS — E43 Unspecified severe protein-calorie malnutrition: Secondary | ICD-10-CM | POA: Diagnosis not present

## 2020-09-24 DIAGNOSIS — E871 Hypo-osmolality and hyponatremia: Secondary | ICD-10-CM | POA: Insufficient documentation

## 2020-10-01 ENCOUNTER — Other Ambulatory Visit: Payer: Self-pay | Admitting: Family Medicine

## 2020-10-01 DIAGNOSIS — Z780 Asymptomatic menopausal state: Secondary | ICD-10-CM

## 2020-10-02 ENCOUNTER — Other Ambulatory Visit: Payer: Self-pay

## 2020-10-02 NOTE — Patient Outreach (Signed)
[facility]) Care Management  10/02/2020  Maureen [name] Mar 15, 1946 [id number]   Referral Date: 10/02/20 Referral Source: [employer] Report Date of Discharge: 09/29/20 Facility:  [facility] Insurance: [facility]   Referral received.  No outreach warranted at this time.  Transition of Care calls being completed via EMMI. RN CM will outreach patient for any red flags received.    Plan: RN CM will close case.    Bary Leriche, RN, MSN [facility] Care Management Care Management Coordinator Direct Line [phone] Toll Free: [phone]  Fax: [phone]

## 2020-10-05 DIAGNOSIS — I480 Paroxysmal atrial fibrillation: Secondary | ICD-10-CM | POA: Diagnosis not present

## 2020-10-05 DIAGNOSIS — I129 Hypertensive chronic kidney disease with stage 1 through stage 4 chronic kidney disease, or unspecified chronic kidney disease: Secondary | ICD-10-CM | POA: Diagnosis not present

## 2020-10-05 DIAGNOSIS — F330 Major depressive disorder, recurrent, mild: Secondary | ICD-10-CM | POA: Diagnosis not present

## 2020-10-05 DIAGNOSIS — E440 Moderate protein-calorie malnutrition: Secondary | ICD-10-CM | POA: Diagnosis not present

## 2020-10-05 DIAGNOSIS — N189 Chronic kidney disease, unspecified: Secondary | ICD-10-CM | POA: Diagnosis not present

## 2020-10-05 DIAGNOSIS — E871 Hypo-osmolality and hyponatremia: Secondary | ICD-10-CM | POA: Diagnosis not present

## 2020-10-05 DIAGNOSIS — E039 Hypothyroidism, unspecified: Secondary | ICD-10-CM | POA: Diagnosis not present

## 2020-10-05 DIAGNOSIS — R1314 Dysphagia, pharyngoesophageal phase: Secondary | ICD-10-CM | POA: Diagnosis not present

## 2020-10-05 DIAGNOSIS — K222 Esophageal obstruction: Secondary | ICD-10-CM | POA: Diagnosis not present

## 2020-10-09 DIAGNOSIS — E039 Hypothyroidism, unspecified: Secondary | ICD-10-CM | POA: Diagnosis not present

## 2020-10-09 DIAGNOSIS — N189 Chronic kidney disease, unspecified: Secondary | ICD-10-CM | POA: Diagnosis not present

## 2020-10-09 DIAGNOSIS — I480 Paroxysmal atrial fibrillation: Secondary | ICD-10-CM | POA: Diagnosis not present

## 2020-10-09 DIAGNOSIS — F330 Major depressive disorder, recurrent, mild: Secondary | ICD-10-CM | POA: Diagnosis not present

## 2020-10-09 DIAGNOSIS — K222 Esophageal obstruction: Secondary | ICD-10-CM | POA: Diagnosis not present

## 2020-10-09 DIAGNOSIS — E440 Moderate protein-calorie malnutrition: Secondary | ICD-10-CM | POA: Diagnosis not present

## 2020-10-09 DIAGNOSIS — E871 Hypo-osmolality and hyponatremia: Secondary | ICD-10-CM | POA: Diagnosis not present

## 2020-10-09 DIAGNOSIS — I129 Hypertensive chronic kidney disease with stage 1 through stage 4 chronic kidney disease, or unspecified chronic kidney disease: Secondary | ICD-10-CM | POA: Diagnosis not present

## 2020-10-09 DIAGNOSIS — R1314 Dysphagia, pharyngoesophageal phase: Secondary | ICD-10-CM | POA: Diagnosis not present

## 2020-10-11 DIAGNOSIS — K222 Esophageal obstruction: Secondary | ICD-10-CM | POA: Diagnosis not present

## 2020-10-11 DIAGNOSIS — F330 Major depressive disorder, recurrent, mild: Secondary | ICD-10-CM | POA: Diagnosis not present

## 2020-10-11 DIAGNOSIS — I129 Hypertensive chronic kidney disease with stage 1 through stage 4 chronic kidney disease, or unspecified chronic kidney disease: Secondary | ICD-10-CM | POA: Diagnosis not present

## 2020-10-11 DIAGNOSIS — N189 Chronic kidney disease, unspecified: Secondary | ICD-10-CM | POA: Diagnosis not present

## 2020-10-11 DIAGNOSIS — E871 Hypo-osmolality and hyponatremia: Secondary | ICD-10-CM | POA: Diagnosis not present

## 2020-10-11 DIAGNOSIS — I480 Paroxysmal atrial fibrillation: Secondary | ICD-10-CM | POA: Diagnosis not present

## 2020-10-11 DIAGNOSIS — E039 Hypothyroidism, unspecified: Secondary | ICD-10-CM | POA: Diagnosis not present

## 2020-10-11 DIAGNOSIS — R1314 Dysphagia, pharyngoesophageal phase: Secondary | ICD-10-CM | POA: Diagnosis not present

## 2020-10-11 DIAGNOSIS — E440 Moderate protein-calorie malnutrition: Secondary | ICD-10-CM | POA: Diagnosis not present

## 2020-10-12 NOTE — Progress Notes (Signed)
Name: Maureen [name]   MRN: [id number]    DOB: 19-Jan-1946   Date:10/15/2020       Progress Note  Subjective  Chief Complaint  Follow up   HPI   Malnutrition: BMI was down to 15.72 , she has lost 43 lbs since 07/2019 ( one year )  She had seen hematologist , gastroenterologist, psychiatrist. She continue to refuse to eat and sodium dropped and also white count, [facility]. She was admitted and offered to have NG tube however she refused, she had multiple labs done , given medication to help her bp she was given salt tablets. [facility] was [facility] to a long term care facility/rehab on 09/19/2020 and was supposed to stay for 20 days, however her insurance only allowed 10 days so she has been home since 09/29/2020.   Per son weight went up to 93 lbs during stay at long term facility , on 09/24/2020 weight was down to 88 lbs and today is 85 lbs , she continues to restrict her diet, she eats very small amounts of food and every 4-5 hours. Explained again the need to eat every 2 hours, to add protein powder ( including peanut butter powder) , stage III baby food, add small amounts of canned chicken and or very small ground beef to mash potatoes. Son has also written down a list of items for her to eat daily with check marks but she is not consistent using it. She has [employer] checking on her at home.   MDD: she was seen by Dr. Salvatore DecentHashani - psychiatrist , last visit 05/2020 and has been released per patient/son's request, she does not want to take Remeron - afraid of polypharmacy. She has been taking clonazepam prn only and not as often since discharge  She seems more engaged today. Correcting her son   Hypothyroidism: she has been taking medication daily now, 88 mcg daily , during hospital stay her TSH was elevated, we will recheck it today   Paroxysmal Afib: she never started Eliquis, she states cardiologist called her for a visit but she did not schedule  it. She denies chest pain or palpitation, she is afraid of taking medications. Unchanged   Atherosclerosis of aorta: found on CT chest , patient is not interested on statin therapy, palliative care  Pancytopenia: she has been seeing Dr. Orlie DakinFinnegan, last lab work for follow up of leucopenia, showed pancytopenia with low platelets, white count and also showed anemia, labs reviewed from hospital stay, platelets improved   Patient Active Problem List   Diagnosis Date Noted  . Hyponatremia 09/24/2020  . Major depression in remission (HCC) 09/24/2020  . Protein-calorie malnutrition, severe 09/13/2020  . Esophageal motility disorder   . Palliative care by specialist   . Advanced care planning/counseling discussion   . Failure to thrive in adult 09/11/2020  . Avoidant-restrictive food intake disorder (ARFID) 08/30/2020  . Paroxysmal atrial fibrillation (HCC) 03/22/2020  . Vitamin D deficiency 03/01/2020  . Dysphagia   . Arrhythmia 08/25/2019  . Rectal bleeding   . Low grade squamous intraepith lesion on cytologic smear cervix (lgsil) 08/04/2018  . Chronic venous insufficiency 07/05/2018  . Lymphedema 07/05/2018  . Hypertension 04/02/2018  . Swelling of limb 04/02/2018  . Depression, major, recurrent, mild (HCC) 12/29/2017  . Leukopenia 05/24/2017  . Allergic rhinitis, seasonal 06/29/2015  . Clavus 06/29/2015  . 1st degree AV block 06/29/2015  . Hammer toe 06/29/2015  . H/O: HTN (  hypertension) 06/29/2015  . Blood glucose elevated 06/29/2015  . Floater, vitreous 06/29/2015  . Bilateral tinnitus 06/29/2015  . Hypothyroidism (acquired) 04/02/2015  . Gastroesophageal reflux disease 04/02/2015  . Hammer toe of right foot 04/02/2015  . Scoliosis of thoracic spine 04/02/2015  . Urethral prolapse 04/02/2015  . Corn of toe 04/02/2015  . Hyperlipidemia 04/02/2015  . Varicose veins of both lower extremities 04/02/2015    Past Surgical History:  Procedure Laterality Date  . ABDOMINAL  HYSTERECTOMY  1989  . APPENDECTOMY  1989  . BREAST EXCISIONAL BIOPSY Bilateral    multiple biopsies negative  . BREAST SURGERY     Several  . COLONOSCOPY WITH PROPOFOL N/A 07/28/2019   Procedure: COLONOSCOPY WITH PROPOFOL;  Surgeon: Toney Reil, MD;  Location: [facility] ENDOSCOPY;  Service: Gastroenterology;  Laterality: N/A;  . ESOPHAGOGASTRODUODENOSCOPY (EGD) WITH PROPOFOL N/A 11/11/2019   Procedure: ESOPHAGOGASTRODUODENOSCOPY (EGD) WITH PROPOFOL;  Surgeon: Toney Reil, MD;  Location: [facility] ENDOSCOPY;  Service: Gastroenterology;  Laterality: N/A;  . TUMOR EXCISION  1989   same time as hysterectomy    Family History  Problem Relation Age of Onset  . Diabetes Father 17       DM complications  . Hypertension Mother 73       CKD  . Thyroid disease Mother   . Heart failure Mother   . Kidney disease Mother   . Depression Mother   . Breast cancer Sister 50  . Depression Sister   . Kidney disease Other   . Breast cancer Maternal Aunt   . Breast cancer Cousin   . Anuerysm Son 51  . Diabetes Son     Social History   Tobacco Use  . Smoking status: Never Smoker  . Smokeless tobacco: Never Used  . Tobacco comment: smoking cessation materials not required  Substance Use Topics  . Alcohol use: Never    Alcohol/week: 0.0 standard drinks     Current Outpatient Medications:  .  clonazepam (KLONOPIN) 0.125 MG disintegrating tablet, Take 1 tablet (0.125 mg total) by mouth daily., Disp: 10 tablet, Rfl: 0 .  Ensure (ENSURE), Take 237 mLs by mouth. Pt drinking approx 3 ensures per day, Disp: , Rfl:  .  levothyroxine (SYNTHROID) 88 MCG tablet, Take 1 tablet (88 mcg total) by mouth daily before breakfast., Disp: 90 tablet, Rfl: 0 .  omeprazole (PRILOSEC) 40 MG capsule, Take 1 capsule (40 mg total) by mouth daily before breakfast., Disp: 90 capsule, Rfl: 3 .  sodium chloride 1 g tablet, Take 1 tablet (1 g total) by mouth 2 (two) times daily with a meal., Disp: , Rfl:  .   Iron-Vitamins (GERITOL COMPLETE PO), Take 1 tablet by mouth daily. (Patient not taking: No sig reported), Disp: , Rfl:  .  midodrine (PROAMATINE) 2.5 MG tablet, Take 1 tablet (2.5 mg total) by mouth 3 (three) times daily with meals. (Patient not taking: Reported on 10/15/2020), Disp: , Rfl:  .  mirtazapine (REMERON) 15 MG tablet, 7.5 mg at night for two weeks, then 15 mg at night (Patient not taking: No sig reported), Disp: 30 tablet, Rfl: 1 .  Vitamin D, Ergocalciferol, (DRISDOL) 1.25 MG (50000 UNIT) CAPS capsule, Take 1 capsule (50,000 Units total) by mouth every 7 (seven) days. (Patient not taking: Reported on 10/15/2020), Disp: 5 capsule, Rfl:   Allergies  Allergen Reactions  . Aspirin     Tinnitus  . Citalopram Other (See Comments)  . Codeine Nausea And Vomiting  . Penicillins Rash  Has patient had a PCN reaction causing immediate rash, facial/tongue/throat swelling, SOB or lightheadedness with hypotension: Yes Has patient had a PCN reaction causing severe rash involving mucus membranes or skin necrosis: No Has patient had a PCN reaction that required hospitalization No Has patient had a PCN reaction occurring within the last 10 years: No If all of the above answers are "NO", then may proceed with Cephalosporin use.    I personally reviewed active problem list, medication list, allergies, family history, social history, health maintenance, notes from last encounter with the patient/caregiver today.   ROS  Constitutional: Negative for fever or significant  weight change.  Respiratory: Negative for cough and shortness of breath.   Cardiovascular: Negative for chest pain or palpitations.  Gastrointestinal: Negative for abdominal pain, no bowel changes.  Musculoskeletal: Negative for gait problem or joint swelling.  Skin: Negative for rash.  Neurological: Negative for dizziness or headache.  No other specific complaints in a complete review of systems (except as listed in HPI  above).  Objective  Vitals:   10/15/20 1158  BP: 110/72  Pulse: 83  Resp: 16  Temp: 98.2 F (36.8 C)  TempSrc: Oral  SpO2: 96%  Weight: 85 lb 9.6 oz (38.8 kg)  Height:  (1.549 m)    Body mass index is 16.17 kg/m.  Physical Exam  Constitutional: Patient appears cachetic No distress.  HEENT: head atraumatic, normocephalic, pupils equal and reactive to light,  neck supple Cardiovascular: Normal rate, regular rhythm and normal heart sounds.  No murmur heard. No BLE edema. Pulmonary/Chest: Effort normal and breath sounds normal. No respiratory distress. Abdominal: Soft.  There is no tenderness. Psychiatric: Patient has a normal mood and affect. behavior is normal. Judgment and thought content normal.  Recent Results (from the past 2160 hour(s))  Magnesium     Status: None   Collection Time: 08/30/20 11:46 AM  Result Value Ref Range   Magnesium 2.3 1.5 - 2.5 mg/dL  COMPLETE METABOLIC PANEL WITH GFR     Status: Abnormal   Collection Time: 08/30/20 11:46 AM  Result Value Ref Range   Glucose, Bld 101 (H) 65 - 99 mg/dL    Comment: .            Fasting reference interval . For someone without known diabetes, a glucose value between 100 and 125 mg/dL is consistent with prediabetes and should be confirmed with a follow-up test. .    BUN 9 7 - 25 mg/dL   Creat 6.04 (L) 5.40 - 0.93 mg/dL    Comment: For patients >32 years of age, the reference limit for Creatinine is approximately 13% higher for people identified as African-American. .    GFR, Est Non African American 95 > OR = 60 mL/min/1.5m2   GFR, Est African American 110 > OR = 60 mL/min/1.21m2   BUN/Creatinine Ratio 18 6 - 22 (calc)   Sodium 131 (L) 135 - 146 mmol/L   Potassium 4.5 3.5 - 5.3 mmol/L   Chloride 91 (L) 98 - 110 mmol/L   CO2 33 (H) 20 - 32 mmol/L   Calcium 9.2 8.6 - 10.4 mg/dL   Total Protein 6.8 6.1 - 8.1 g/dL   Albumin 3.8 3.6 - 5.1 g/dL   Globulin 3.0 1.9 - 3.7 g/dL (calc)   AG Ratio 1.3  1.0 - 2.5 (calc)   Total Bilirubin 0.4 0.2 - 1.2 mg/dL   Alkaline phosphatase (APISO) 70 37 - 153 U/L   AST 29 10 - 35 U/L  ALT 17 6 - 29 U/L  Vitamin B12     Status: None   Collection Time: 08/30/20 11:46 AM  Result Value Ref Range   Vitamin B-12 875 200 - 1,100 pg/mL  VITAMIN D 25 Hydroxy (Vit-D Deficiency, Fractures)     Status: Abnormal   Collection Time: 08/30/20 11:46 AM  Result Value Ref Range   Vit D, 25-Hydroxy 20 (L) 30 - 100 ng/mL    Comment: Vitamin D Status         25-OH Vitamin D: . Deficiency:                    <20 ng/mL Insufficiency:             20 - 29 ng/mL Optimal:                 > or = 30 ng/mL . For 25-OH Vitamin D testing on patients on  D2-supplementation and patients for whom quantitation  of D2 and D3 fractions is required, the Quest[employer]ureD(TM) 25-OH VIT D, (D2,D3), LC/MS/MS is recommended: order  code 16109 (patients >6yrs). See Note 1 . Note 1 . For additional information, please refer to  http://education.Quest[facility].com/faq/FAQ199  (This link is being provided for informational/ educational purposes only.)   CBC with Differential/Platelet     Status: Abnormal   Collection Time: 08/30/20 11:46 AM  Result Value Ref Range   WBC 2.0 (L) 3.8 - 10.8 Thousand/uL   RBC 3.33 (L) 3.80 - 5.10 Million/uL   Hemoglobin 10.8 (L) 11.7 - 15.5 g/dL   HCT 60.4 (L) 54.0 - 98.1 %   MCV 94.6 80.0 - 100.0 fL   MCH 32.4 27.0 - 33.0 pg   MCHC 34.3 32.0 - 36.0 g/dL   RDW 19.1 47.8 - 29.5 %   Platelets 138 (L) 140 - 400 Thousand/uL   MPV 12.9 (H) 7.5 - 12.5 fL   Neutro Abs 884 (L) 1,500 - 7,800 cells/uL   Lymphs Abs 820 (L) 850 - 3,900 cells/uL   Absolute Monocytes 266 200 - 950 cells/uL   Eosinophils Absolute 10 (L) 15 - 500 cells/uL   Basophils Absolute 20 0 - 200 cells/uL   Neutrophils Relative % 44.2 %   Total Lymphocyte 41.0 %   Monocytes Relative 13.3 %   Eosinophils Relative 0.5 %   Basophils Relative 1.0 %  TSH     Status: Abnormal    Collection Time: 08/30/20 11:46 AM  Result Value Ref Range   TSH 15.14 (H) 0.40 - 4.50 mIU/L  Comprehensive metabolic panel     Status: Abnormal   Collection Time: 09/11/20 11:40 AM  Result Value Ref Range   Sodium 128 (L) 135 - 145 mmol/L   Potassium 3.9 3.5 - 5.1 mmol/L   Chloride 88 (L) 98 - 111 mmol/L   CO2 33 (H) 22 - 32 mmol/L   Glucose, Bld 79 70 - 99 mg/dL    Comment: Glucose reference range applies only to samples taken after fasting for at least 8 hours.   BUN 11 8 - 23 mg/dL   Creatinine, Ser 6.21 0.44 - 1.00 mg/dL   Calcium 8.7 (L) 8.9 - 10.3 mg/dL   Total Protein 7.3 6.5 - 8.1 g/dL   Albumin 3.8 3.5 - 5.0 g/dL   AST 41 15 - 41 U/L   ALT 31 0 - 44 U/L   Alkaline Phosphatase 69 38 - 126 U/L   Total Bilirubin 0.7 0.3 - 1.2 mg/dL  GFR, Estimated >60 >60 mL/min    Comment: (NOTE) Calculated using the CKD-EPI Creatinine Equation (2021)    Anion gap 7 5 - 15    Comment: Performed at [facility], [address] [place], [place] [address]  CBC with Differential     Status: Abnormal   Collection Time: 09/11/20 11:40 AM  Result Value Ref Range   WBC 2.6 (L) 4.0 - 10.5 K/uL   RBC 3.25 (L) 3.87 - 5.11 MIL/uL   Hemoglobin 10.2 (L) 12.0 - 15.0 g/dL   HCT 60.4 (L) 54.0 - 98.1 %   MCV 92.3 80.0 - 100.0 fL   MCH 31.4 26.0 - 34.0 pg   MCHC 34.0 30.0 - 36.0 g/dL   RDW 19.1 47.8 - 29.5 %   Platelets 150 150 - 400 K/uL   nRBC 0.0 0.0 - 0.2 %   Neutrophils Relative % 44 %   Neutro Abs 1.1 (L) 1.7 - 7.7 K/uL   Lymphocytes Relative 43 %   Lymphs Abs 1.1 0.7 - 4.0 K/uL   Monocytes Relative 12 %   Monocytes Absolute 0.3 0.1 - 1.0 K/uL   Eosinophils Relative 0 %   Eosinophils Absolute 0.0 0.0 - 0.5 K/uL   Basophils Relative 1 %   Basophils Absolute 0.0 0.0 - 0.1 K/uL   Immature Granulocytes 0 %   Abs Immature Granulocytes 0.00 0.00 - 0.07 K/uL    Comment: Performed at [facility], [address] [place], [place] [address]  Lipase, blood     Status: None    Collection Time: 09/11/20 11:40 AM  Result Value Ref Range   Lipase 31 11 - 51 U/L    Comment: Performed at [facility], [address] [place], [place] [address]  TSH     Status: Abnormal   Collection Time: 09/11/20 11:40 AM  Result Value Ref Range   TSH 17.499 (H) 0.350 - 4.500 uIU/mL    Comment: Performed by a 3rd Generation assay with a functional sensitivity of <=0.01 uIU/mL. Performed at [facility], [address] [place], [place] [address]   VITAMIN D 25 Hydroxy (Vit-D Deficiency, Fractures)     Status: Abnormal   Collection Time: 09/11/20 11:40 AM  Result Value Ref Range   Vit D, 25-Hydroxy 16.9 (L) 30 - 100 ng/mL    Comment: Performed at [employer] [facility] (NOTE) Vitamin D deficiency has been defined by the Institute of Medicine  and an Endocrine Society practice guideline as a level of serum 25-OH  vitamin D less than 20 ng/mL (1,2). The Endocrine Society went on to  further define vitamin D insufficiency as a level between 21 and 29  ng/mL (2).  1. IOM (Institute of Medicine). 2010. Dietary reference intakes for  calcium and D. [place]: [employer]. 2. Holick MF, Binkley [place], Bischoff-Ferrari HA, et al. Evaluation,  treatment, and prevention of vitamin D deficiency: an Endocrine  Society clinical practice guideline, JCEM. 2011 Jul; 96(7): 1911-30.  Performed at [facility], [address], [place], [place] [address]   Magnesium     Status: None   Collection Time: 09/11/20 11:40 AM  Result Value Ref Range   Magnesium 2.3 1.7 - 2.4 mg/dL    Comment: Performed at [facility], [address] [place], [place] [address]  Phosphorus     Status: None   Collection Time: 09/11/20 11:40 AM  Result Value Ref Range   Phosphorus 3.3 2.5 - 4.6 mg/dL  Comment: Performed at [facility], [address] [place], [place] [address]  Urinalysis, Complete w Microscopic     Status: Abnormal   Collection  Time: 09/11/20 11:45 AM  Result Value Ref Range   Color, Urine YELLOW (A) YELLOW   APPearance HAZY (A) CLEAR   Specific Gravity, Urine 1.016 1.005 - 1.030   pH 8.0 5.0 - 8.0   Glucose, UA NEGATIVE NEGATIVE mg/dL   Hgb urine dipstick NEGATIVE NEGATIVE   Bilirubin Urine NEGATIVE NEGATIVE   Ketones, ur NEGATIVE NEGATIVE mg/dL   Protein, ur NEGATIVE NEGATIVE mg/dL   Nitrite NEGATIVE NEGATIVE   Leukocytes,Ua MODERATE (A) NEGATIVE   RBC / HPF 0-5 0 - 5 RBC/hpf   WBC, UA 11-20 0 - 5 WBC/hpf   Bacteria, UA NONE SEEN NONE SEEN   Squamous Epithelial / LPF 0-5 0 - 5   Mucus PRESENT     Comment: Performed at [facility], [address] [place], [place] [address]  Respiratory Panel by RT PCR (Flu A&B, Covid) - Nasopharyngeal Swab     Status: None   Collection Time: 09/11/20  2:46 PM   Specimen: Nasopharyngeal Swab  Result Value Ref Range   SARS Coronavirus 2 by RT PCR NEGATIVE NEGATIVE    Comment: (NOTE) SARS-CoV-2 target nucleic acids are NOT DETECTED.  The SARS-CoV-2 RNA is generally detectable in upper respiratoy specimens during the acute phase of infection. The lowest concentration of SARS-CoV-2 viral copies this assay can detect is 131 copies/mL. A negative result does not preclude SARS-Cov-2 infection and should not be used as the sole basis for treatment or other patient management decisions. A negative result may occur with  improper specimen collection/handling, submission of specimen other than nasopharyngeal swab, presence of viral mutation(s) within the areas targeted by this assay, and inadequate number of viral copies (<131 copies/mL). A negative result must be combined with clinical observations, patient history, and epidemiological information. The expected result is Negative.  Fact Sheet for Patients:  https://www.moore.com/  Fact Sheet for Healthcare Providers:  https://www.young.biz/  This test is no t yet approved  or cleared by the [place] FDA and  has been authorized for detection and/or diagnosis of SARS-CoV-2 by FDA under an Emergency Use Authorization (EUA). This EUA will remain  in effect (meaning this test can be used) for the duration of the COVID-19 declaration under Section 564(b)(1) of the Act, 21 U.S.C. section 360bbb-3(b)(1), unless the authorization is terminated or revoked sooner.     Influenza A by PCR NEGATIVE NEGATIVE   Influenza B by PCR NEGATIVE NEGATIVE    Comment: (NOTE) The Xpert Xpress SARS-CoV-2/FLU/RSV assay is intended as an aid in  the diagnosis of influenza from Nasopharyngeal swab specimens and  should not be used as a sole basis for treatment. Nasal washings and  aspirates are unacceptable for Xpert Xpress SARS-CoV-2/FLU/RSV  testing.  Fact Sheet for Patients: https://www.moore.com/  Fact Sheet for Healthcare Providers: https://www.young.biz/  This test is not yet approved or cleared by the [place] FDA and  has been authorized for detection and/or diagnosis of SARS-CoV-2 by  FDA under an Emergency Use Authorization (EUA). This EUA will remain  in effect (meaning this test can be used) for the duration of the  Covid-19 declaration under Section 564(b)(1) of the Act, 21  U.S.C. section 360bbb-3(b)(1), unless the authorization is  terminated or revoked. Performed at [facility], [address] [place], [place] [address]   Basic metabolic panel  Status: Abnormal   Collection Time: 09/13/20  5:40 AM  Result Value Ref Range   Sodium 131 (L) 135 - 145 mmol/L   Potassium 4.0 3.5 - 5.1 mmol/L   Chloride 95 (L) 98 - 111 mmol/L   CO2 27 22 - 32 mmol/L   Glucose, Bld 71 70 - 99 mg/dL    Comment: Glucose reference range applies only to samples taken after fasting for at least 8 hours.   BUN 8 8 - 23 mg/dL   Creatinine, Ser 1.61 0.44 - 1.00 mg/dL   Calcium 8.4 (L) 8.9 - 10.3 mg/dL   GFR, Estimated >09  >60 mL/min    Comment: (NOTE) Calculated using the CKD-EPI Creatinine Equation (2021)    Anion gap 9 5 - 15    Comment: Performed at [facility], [address] [place], [place] [address]  Magnesium     Status: None   Collection Time: 09/13/20  5:40 AM  Result Value Ref Range   Magnesium 2.1 1.7 - 2.4 mg/dL    Comment: Performed at [facility], [address] [place], [place] [address]  CBC     Status: Abnormal   Collection Time: 09/13/20  5:40 AM  Result Value Ref Range   WBC 2.6 (L) 4.0 - 10.5 K/uL   RBC 3.09 (L) 3.87 - 5.11 MIL/uL   Hemoglobin 9.8 (L) 12.0 - 15.0 g/dL   HCT 47.8 (L) 29.5 - 62.1 %   MCV 90.6 80.0 - 100.0 fL   MCH 31.7 26.0 - 34.0 pg   MCHC 35.0 30.0 - 36.0 g/dL   RDW 30.8 65.7 - 84.6 %   Platelets 140 (L) 150 - 400 K/uL   nRBC 0.0 0.0 - 0.2 %    Comment: Performed at [facility], [address] [place], [place] [address]  Phosphorus     Status: None   Collection Time: 09/13/20  5:40 AM  Result Value Ref Range   Phosphorus 3.6 2.5 - 4.6 mg/dL    Comment: Performed at [facility], [address] [place], [place] [address]  Ferritin     Status: None   Collection Time: 09/13/20  5:40 AM  Result Value Ref Range   Ferritin 300 11 - 307 ng/mL    Comment: Performed at [facility], [address] [place], [place] [address]  Iron and TIBC     Status: Abnormal   Collection Time: 09/13/20  5:40 AM  Result Value Ref Range   Iron 53 28 - 170 ug/dL   TIBC 027 (L) 253 - 664 ug/dL   Saturation Ratios 23 10.4 - 31.8 %   UIBC 174 ug/dL    Comment: Performed at [facility], [address] [place], [place] [address]  Basic metabolic panel     Status: Abnormal   Collection Time: 09/14/20  3:59 AM  Result Value Ref Range   Sodium 133 (L) 135 - 145 mmol/L   Potassium 3.9 3.5 - 5.1 mmol/L   Chloride 99 98 - 111 mmol/L   CO2 29 22 - 32 mmol/L   Glucose, Bld 77 70 - 99 mg/dL    Comment: Glucose  reference range applies only to samples taken after fasting for at least 8 hours.   BUN 11 8 - 23 mg/dL   Creatinine, Ser 4.25 0.44 - 1.00 mg/dL   Calcium 8.0 (L) 8.9 - 10.3 mg/dL   GFR, Estimated >95 >63 mL/min    Comment: (NOTE) Calculated using  the CKD-EPI Creatinine Equation (2021)    Anion gap 5 5 - 15    Comment: Performed at [facility], [address] [place], [place] [address]  Magnesium     Status: None   Collection Time: 09/14/20  3:59 AM  Result Value Ref Range   Magnesium 2.1 1.7 - 2.4 mg/dL    Comment: Performed at [facility], [address] [place], [place] [address]  Phosphorus     Status: None   Collection Time: 09/14/20  3:59 AM  Result Value Ref Range   Phosphorus 3.2 2.5 - 4.6 mg/dL    Comment: Performed at [facility], [address] [place], [place] [address]  CBC     Status: Abnormal   Collection Time: 09/14/20  3:59 AM  Result Value Ref Range   WBC 2.3 (L) 4.0 - 10.5 K/uL   RBC 2.85 (L) 3.87 - 5.11 MIL/uL   Hemoglobin 9.1 (L) 12.0 - 15.0 g/dL   HCT 96.2 (L) 95.2 - 84.1 %   MCV 91.2 80.0 - 100.0 fL   MCH 31.9 26.0 - 34.0 pg   MCHC 35.0 30.0 - 36.0 g/dL   RDW 32.4 40.1 - 02.7 %   Platelets 139 (L) 150 - 400 K/uL   nRBC 0.0 0.0 - 0.2 %    Comment: Performed at [facility], [address] [place], [place] [address]  Basic metabolic panel     Status: Abnormal   Collection Time: 09/15/20  4:09 AM  Result Value Ref Range   Sodium 132 (L) 135 - 145 mmol/L   Potassium 3.9 3.5 - 5.1 mmol/L   Chloride 96 (L) 98 - 111 mmol/L   CO2 29 22 - 32 mmol/L   Glucose, Bld 78 70 - 99 mg/dL    Comment: Glucose reference range applies only to samples taken after fasting for at least 8 hours.   BUN 10 8 - 23 mg/dL   Creatinine, Ser 4.40 0.44 - 1.00 mg/dL   Calcium 8.5 (L) 8.9 - 10.3 mg/dL   GFR, Estimated >34 >74 mL/min    Comment: (NOTE) Calculated using the CKD-EPI Creatinine Equation (2021)    Anion gap 7 5 - 15     Comment: Performed at [facility], [address] [place], [place] [address]  Magnesium     Status: None   Collection Time: 09/15/20  4:09 AM  Result Value Ref Range   Magnesium 2.2 1.7 - 2.4 mg/dL    Comment: Performed at [facility], [address] [place], [place] [address]  Phosphorus     Status: None   Collection Time: 09/15/20  4:09 AM  Result Value Ref Range   Phosphorus 3.2 2.5 - 4.6 mg/dL    Comment: Performed at [facility], [address] [place], [place] [address]  Folate, serum, performed at [facility] lab     Status: None   Collection Time: 09/15/20  4:09 AM  Result Value Ref Range   Folate 10.4 >5.9 ng/mL    Comment: Performed at [facility] [facility], [address] [place], [place] [address]  CBC     Status: Abnormal   Collection Time: 09/15/20  4:09 AM  Result Value Ref Range   WBC 3.1 (L) 4.0 - 10.5 K/uL   RBC 3.03 (L) 3.87 - 5.11 MIL/uL   Hemoglobin 9.5 (L) 12.0 - 15.0 g/dL   HCT 16.6 (L) 06.3 - 01.6 %   MCV 91.4 80.0 -  100.0 fL   MCH 31.4 26.0 - 34.0 pg   MCHC 34.3 30.0 - 36.0 g/dL   RDW 40.9 81.1 - 91.4 %   Platelets 147 (L) 150 - 400 K/uL   nRBC 0.0 0.0 - 0.2 %    Comment: Performed at [facility], [address], [place], [place] [address]  Basic metabolic panel     Status: Abnormal   Collection Time: 09/16/20  4:04 AM  Result Value Ref Range   Sodium 130 (L) 135 - 145 mmol/L   Potassium 4.0 3.5 - 5.1 mmol/L   Chloride 97 (L) 98 - 111 mmol/L   CO2 29 22 - 32 mmol/L   Glucose, Bld 84 70 - 99 mg/dL    Comment: Glucose reference range applies only to samples taken after fasting for at least 8 hours.   BUN 11 8 - 23 mg/dL   Creatinine, Ser 6.21 0.44 - 1.00 mg/dL   Calcium 8.3 (L) 8.9 - 10.3 mg/dL   GFR, Estimated >30 >86 mL/min    Comment: (NOTE) Calculated using the CKD-EPI Creatinine Equation (2021)    Anion gap 4 (L) 5 - 15    Comment: Performed at [facility], [address]  [place], [place] [address]  Phosphorus     Status: None   Collection Time: 09/16/20  4:04 AM  Result Value Ref Range   Phosphorus 3.6 2.5 - 4.6 mg/dL    Comment: Performed at [facility], [address] [place], [place] [address]  CBC     Status: Abnormal   Collection Time: 09/16/20  4:04 AM  Result Value Ref Range   WBC 3.1 (L) 4.0 - 10.5 K/uL   RBC 2.90 (L) 3.87 - 5.11 MIL/uL   Hemoglobin 9.2 (L) 12.0 - 15.0 g/dL   HCT 28.4 (L) 13.2 - 44.0 %   MCV 91.4 80.0 - 100.0 fL   MCH 31.7 26.0 - 34.0 pg   MCHC 34.7 30.0 - 36.0 g/dL   RDW 10.2 72.5 - 36.6 %   Platelets 158 150 - 400 K/uL   nRBC 0.0 0.0 - 0.2 %    Comment: Performed at [facility], [address] [place], [place] [address]  Osmolality     Status: None   Collection Time: 09/16/20  4:04 AM  Result Value Ref Range   Osmolality 277 275 - 295 mOsm/kg    Comment: Performed at [facility], [address] [place], [place] [address]  Osmolality, urine     Status: Abnormal   Collection Time: 09/16/20  9:39 AM  Result Value Ref Range   Osmolality, Ur 245 (L) 300 - 900 mOsm/kg    Comment: Performed at [facility], [address] [place], [place] [address]  Na and K (sodium & potassium), rand urine     Status: None   Collection Time: 09/16/20  9:39 AM  Result Value Ref Range   Sodium, Ur 54 mmol/L   Potassium Urine 38 mmol/L    Comment: Performed at [facility], [address] [place], [place] [address]  Cortisol-am, blood     Status: None   Collection Time: 09/17/20  3:46 AM  Result Value Ref Range   Cortisol - AM 13.2 6.7 - 22.6 ug/dL    Comment: Performed at [facility], [address], [place], [place] [address]  Uric acid     Status: Abnormal   Collection Time: 09/17/20  3:46 AM  Result Value  Ref Range   Uric Acid, Serum 2.2 (L) 2.5 - 7.1 mg/dL    Comment: Performed at [facility], [address] [place], [place] [address]  Basic metabolic panel      Status: Abnormal   Collection Time: 09/17/20  3:46 AM  Result Value Ref Range   Sodium 133 (L) 135 - 145 mmol/L   Potassium 4.1 3.5 - 5.1 mmol/L   Chloride 99 98 - 111 mmol/L   CO2 28 22 - 32 mmol/L   Glucose, Bld 79 70 - 99 mg/dL    Comment: Glucose reference range applies only to samples taken after fasting for at least 8 hours.   BUN 8 8 - 23 mg/dL   Creatinine, Ser 6.04 0.44 - 1.00 mg/dL   Calcium 8.1 (L) 8.9 - 10.3 mg/dL   GFR, Estimated >54 >09 mL/min    Comment: (NOTE) Calculated using the CKD-EPI Creatinine Equation (2021)    Anion gap 6 5 - 15    Comment: Performed at [facility], [address] [place], [place] [address]  Resp Panel by RT-PCR (Flu A&B, Covid) Nasopharyngeal Swab     Status: None   Collection Time: 09/17/20  5:58 PM   Specimen: Nasopharyngeal Swab; Nasopharyngeal(NP) swabs in vial transport medium  Result Value Ref Range   SARS Coronavirus 2 by RT PCR NEGATIVE NEGATIVE    Comment: (NOTE) SARS-CoV-2 target nucleic acids are NOT DETECTED.  The SARS-CoV-2 RNA is generally detectable in upper respiratory specimens during the acute phase of infection. The lowest concentration of SARS-CoV-2 viral copies this assay can detect is 138 copies/mL. A negative result does not preclude SARS-Cov-2 infection and should not be used as the sole basis for treatment or other patient management decisions. A negative result may occur with  improper specimen collection/handling, submission of specimen other than nasopharyngeal swab, presence of viral mutation(s) within the areas targeted by this assay, and inadequate number of viral copies(<138 copies/mL). A negative result must be combined with clinical observations, patient history, and epidemiological information. The expected result is Negative.  Fact Sheet for Patients:  BloggerCourse.com  Fact Sheet for Healthcare Providers:  SeriousBroker.it  This test  is no t yet approved or cleared by the [place] FDA and  has been authorized for detection and/or diagnosis of SARS-CoV-2 by FDA under an Emergency Use Authorization (EUA). This EUA will remain  in effect (meaning this test can be used) for the duration of the COVID-19 declaration under Section 564(b)(1) of the Act, 21 U.S.C.section 360bbb-3(b)(1), unless the authorization is terminated  or revoked sooner.       Influenza A by PCR NEGATIVE NEGATIVE   Influenza B by PCR NEGATIVE NEGATIVE    Comment: (NOTE) The Xpert Xpress SARS-CoV-2/FLU/RSV plus assay is intended as an aid in the diagnosis of influenza from Nasopharyngeal swab specimens and should not be used as a sole basis for treatment. Nasal washings and aspirates are unacceptable for Xpert Xpress SARS-CoV-2/FLU/RSV testing.  Fact Sheet for Patients: BloggerCourse.com  Fact Sheet for Healthcare Providers: SeriousBroker.it  This test is not yet approved or cleared by the [place] FDA and has been authorized for detection and/or diagnosis of SARS-CoV-2 by FDA under an Emergency Use Authorization (EUA). This EUA will remain in effect (meaning this test can be used) for the duration of the COVID-19 declaration under Section 564(b)(1) of the Act, 21 U.S.C. section 360bbb-3(b)(1), unless the authorization is terminated or revoked.  Performed at [facility], [address] [place], [place] [address]   Basic metabolic panel     Status: Abnormal   Collection Time: 09/18/20  4:45 AM  Result Value Ref Range   Sodium 132 (L) 135 - 145 mmol/L   Potassium 4.2 3.5 - 5.1 mmol/L   Chloride 97 (L) 98 - 111 mmol/L   CO2 28 22 - 32 mmol/L   Glucose, Bld 80 70 - 99 mg/dL    Comment: Glucose reference range applies only to samples taken after fasting for at least 8 hours.   BUN 11 8 - 23 mg/dL   Creatinine, Ser 1.49 0.44 - 1.00 mg/dL   Calcium 8.4 (L) 8.9 - 10.3 mg/dL    GFR, Estimated >70 >26 mL/min    Comment: (NOTE) Calculated using the CKD-EPI Creatinine Equation (2021)    Anion gap 7 5 - 15    Comment: Performed at [facility], [address] [place], [place] [address]  CBC with Differential/Platelet     Status: Abnormal   Collection Time: 09/18/20  4:45 AM  Result Value Ref Range   WBC 3.4 (L) 4.0 - 10.5 K/uL   RBC 3.03 (L) 3.87 - 5.11 MIL/uL   Hemoglobin 9.7 (L) 12.0 - 15.0 g/dL   HCT 85.0 (L) 27.7 - 41.2 %   MCV 91.1 80.0 - 100.0 fL   MCH 32.0 26.0 - 34.0 pg   MCHC 35.1 30.0 - 36.0 g/dL   RDW 87.8 67.6 - 72.0 %   Platelets 176 150 - 400 K/uL   nRBC 0.0 0.0 - 0.2 %   Neutrophils Relative % 43 %   Neutro Abs 1.4 (L) 1.7 - 7.7 K/uL   Lymphocytes Relative 41 %   Lymphs Abs 1.4 0.7 - 4.0 K/uL   Monocytes Relative 12 %   Monocytes Absolute 0.4 0.1 - 1.0 K/uL   Eosinophils Relative 3 %   Eosinophils Absolute 0.1 0.0 - 0.5 K/uL   Basophils Relative 1 %   Basophils Absolute 0.0 0.0 - 0.1 K/uL   Immature Granulocytes 0 %   Abs Immature Granulocytes 0.01 0.00 - 0.07 K/uL    Comment: Performed at [facility] [place], [address] [place], [place] [address]  Basic metabolic panel     Status: Abnormal   Collection Time: 09/19/20  4:15 AM  Result Value Ref Range   Sodium 134 (L) 135 - 145 mmol/L   Potassium 4.1 3.5 - 5.1 mmol/L   Chloride 97 (L) 98 - 111 mmol/L   CO2 28 22 - 32 mmol/L   Glucose, Bld 73 70 - 99 mg/dL    Comment: Glucose reference range applies only to samples taken after fasting for at least 8 hours.   BUN 9 8 - 23 mg/dL   Creatinine, Ser 6.28 0.44 - 1.00 mg/dL   Calcium 8.6 (L) 8.9 - 10.3 mg/dL   GFR, Estimated >36 >62 mL/min    Comment: (NOTE) Calculated using the CKD-EPI Creatinine Equation (2021)    Anion gap 9 5 - 15    Comment: Performed at [facility], [address] [place], [place] [address]  Resp Panel by RT-PCR (Flu A&B, Covid) Nasopharyngeal Swab     Status: None   Collection  Time: 09/19/20  1:21 PM   Specimen: Nasopharyngeal Swab; Nasopharyngeal(NP) swabs in vial transport medium  Result Value Ref Range   SARS Coronavirus 2 by RT PCR NEGATIVE NEGATIVE    Comment: (NOTE) SARS-CoV-2 target nucleic acids are NOT DETECTED.  The SARS-CoV-2 RNA is  generally detectable in upper respiratory specimens during the acute phase of infection. The lowest concentration of SARS-CoV-2 viral copies this assay can detect is 138 copies/mL. A negative result does not preclude SARS-Cov-2 infection and should not be used as the sole basis for treatment or other patient management decisions. A negative result may occur with  improper specimen collection/handling, submission of specimen other than nasopharyngeal swab, presence of viral mutation(s) within the areas targeted by this assay, and inadequate number of viral copies(<138 copies/mL). A negative result must be combined with clinical observations, patient history, and epidemiological information. The expected result is Negative.  Fact Sheet for Patients:  BloggerCourse.com  Fact Sheet for Healthcare Providers:  SeriousBroker.it  This test is no t yet approved or cleared by the [place] FDA and  has been authorized for detection and/or diagnosis of SARS-CoV-2 by FDA under an Emergency Use Authorization (EUA). This EUA will remain  in effect (meaning this test can be used) for the duration of the COVID-19 declaration under Section 564(b)(1) of the Act, 21 U.S.C.section 360bbb-3(b)(1), unless the authorization is terminated  or revoked sooner.       Influenza A by PCR NEGATIVE NEGATIVE   Influenza B by PCR NEGATIVE NEGATIVE    Comment: (NOTE) The Xpert Xpress SARS-CoV-2/FLU/RSV plus assay is intended as an aid in the diagnosis of influenza from Nasopharyngeal swab specimens and should not be used as a sole basis for treatment. Nasal washings and aspirates are  unacceptable for Xpert Xpress SARS-CoV-2/FLU/RSV testing.  Fact Sheet for Patients: BloggerCourse.com  Fact Sheet for Healthcare Providers: SeriousBroker.it  This test is not yet approved or cleared by the [place] FDA and has been authorized for detection and/or diagnosis of SARS-CoV-2 by FDA under an Emergency Use Authorization (EUA). This EUA will remain in effect (meaning this test can be used) for the duration of the COVID-19 declaration under Section 564(b)(1) of the Act, 21 U.S.C. section 360bbb-3(b)(1), unless the authorization is terminated or revoked.  Performed at [facility], [address] [place], [place] [address]       PHQ2/9: Depression screen [facility] 2/9 10/15/2020 08/30/2020 08/09/2020 04/27/2020 03/22/2020  Decreased Interest 3 3 2 2 3   Down, Depressed, Hopeless 2 3 2 2 3   PHQ - 2 Score 5 6 4 4 6   Altered sleeping 0 0 0 2 3  Tired, decreased energy 1 1 1 2 3   Change in appetite 1 2 3 3 3   Feeling bad or failure about yourself  0 3 2 0 3  Trouble concentrating 2 0 2 1 3   Moving slowly or fidgety/restless 0 2 0 1 3  Suicidal thoughts 0 0 0 0 0  PHQ-9 Score 9 14 12 13 24   Difficult doing work/chores - Very difficult Somewhat difficult Somewhat difficult Very difficult  Some recent data might be hidden    phq 9 is positive   Fall Risk: Fall Risk  10/15/2020 08/30/2020 08/09/2020 05/29/2020 04/27/2020  Falls in the past year? 0 0 0 0 0  Number falls in past yr: 0 0 0 0 0  Injury with Fall? 0 0 0 0 0  Risk for fall due to : - - No Fall Risks - -  Risk for fall due to: Comment - - - - -  Follow up - - - - -    Functional Status Survey: Is the patient deaf or have difficulty hearing?: No Does the patient have difficulty seeing, even when wearing glasses/contacts?: No Does  the patient have difficulty concentrating, remembering, or making decisions?: Yes Does the patient have difficulty walking or  climbing stairs?: No Does the patient have difficulty dressing or bathing?: No Does the patient have difficulty doing errands alone such as visiting a doctor's office or shopping?: No    Assessment & Plan  1. Pancytopenia (HCC)   2. Paroxysmal atrial fibrillation (HCC)  - CBC with Differential/Platelet  3. Hypothyroidism (acquired)  - TSH  4. Severe protein-energy malnutrition (HCC)  - BASIC METABOLIC PANEL WITH GFR - CBC with Differential/Platelet  5. Atherosclerosis of aorta (HCC)   6. Hyponatremia  - BASIC METABOLIC PANEL WITH GFR  7. Esophageal dysphagia   8. Gastroesophageal reflux disease with esophagitis without hemorrhage  Needs refills, explained given by Dr. Allegra Lai  9. Mild episode of recurrent major depressive disorder (HCC)  Seems a little more engaged today , refuses medication   10. Vitamin D deficiency  - Vitamin D, Ergocalciferol, (DRISDOL) 1.25 MG (50000 UNIT) CAPS capsule; Take 1 capsule (50,000 Units total) by mouth every 7 (seven) days.  Dispense: 12 capsule; Refill: 0

## 2020-10-15 ENCOUNTER — Ambulatory Visit (INDEPENDENT_AMBULATORY_CARE_PROVIDER_SITE_OTHER): Payer: Medicare HMO | Admitting: Family Medicine

## 2020-10-15 ENCOUNTER — Encounter: Payer: Self-pay | Admitting: Family Medicine

## 2020-10-15 ENCOUNTER — Other Ambulatory Visit: Payer: Self-pay

## 2020-10-15 VITALS — BP 110/72 | HR 83 | Temp 98.2°F | Resp 16 | Ht 61.0 in | Wt 85.6 lb

## 2020-10-15 DIAGNOSIS — D61818 Other pancytopenia: Secondary | ICD-10-CM

## 2020-10-15 DIAGNOSIS — R1319 Other dysphagia: Secondary | ICD-10-CM | POA: Diagnosis not present

## 2020-10-15 DIAGNOSIS — E559 Vitamin D deficiency, unspecified: Secondary | ICD-10-CM

## 2020-10-15 DIAGNOSIS — E871 Hypo-osmolality and hyponatremia: Secondary | ICD-10-CM

## 2020-10-15 DIAGNOSIS — I700 Atherosclerosis of aorta: Secondary | ICD-10-CM

## 2020-10-15 DIAGNOSIS — F330 Major depressive disorder, recurrent, mild: Secondary | ICD-10-CM | POA: Diagnosis not present

## 2020-10-15 DIAGNOSIS — E43 Unspecified severe protein-calorie malnutrition: Secondary | ICD-10-CM

## 2020-10-15 DIAGNOSIS — K2100 Gastro-esophageal reflux disease with esophagitis, without bleeding: Secondary | ICD-10-CM

## 2020-10-15 DIAGNOSIS — E039 Hypothyroidism, unspecified: Secondary | ICD-10-CM | POA: Diagnosis not present

## 2020-10-15 DIAGNOSIS — I480 Paroxysmal atrial fibrillation: Secondary | ICD-10-CM | POA: Diagnosis not present

## 2020-10-15 MED ORDER — VITAMIN D (ERGOCALCIFEROL) 1.25 MG (50000 UNIT) PO CAPS: 50000.0000 [IU] | ORAL_CAPSULE | ORAL | 0 refills | Status: DC

## 2020-10-16 LAB — CBC WITH DIFFERENTIAL/PLATELET
Absolute Monocytes: 351 cells/uL (ref 200–950)
Basophils Absolute: 31 cells/uL (ref 0–200)
Basophils Relative: 1.2 %
Eosinophils Absolute: 31 cells/uL (ref 15–500)
Eosinophils Relative: 1.2 %
HCT: 34.5 % — ABNORMAL LOW (ref 35.0–45.0)
Hemoglobin: 11.3 g/dL — ABNORMAL LOW (ref 11.7–15.5)
Lymphs Abs: 1269 cells/uL (ref 850–3900)
MCH: 31.1 pg (ref 27.0–33.0)
MCHC: 32.8 g/dL (ref 32.0–36.0)
MCV: 95 fL (ref 80.0–100.0)
MPV: 12.4 fL (ref 7.5–12.5)
Monocytes Relative: 13.5 %
Neutro Abs: 918 cells/uL — ABNORMAL LOW (ref 1500–7800)
Neutrophils Relative %: 35.3 %
Platelets: 164 10*3/uL (ref 140–400)
RBC: 3.63 10*6/uL — ABNORMAL LOW (ref 3.80–5.10)
RDW: 12.5 % (ref 11.0–15.0)
Total Lymphocyte: 48.8 %
WBC: 2.6 10*3/uL — ABNORMAL LOW (ref 3.8–10.8)

## 2020-10-16 LAB — BASIC METABOLIC PANEL WITH GFR
BUN/Creatinine Ratio: 18 (calc) (ref 6–22)
BUN: 10 mg/dL (ref 7–25)
CO2: 30 mmol/L (ref 20–32)
Calcium: 9.3 mg/dL (ref 8.6–10.4)
Chloride: 96 mmol/L — ABNORMAL LOW (ref 98–110)
Creat: 0.55 mg/dL — ABNORMAL LOW (ref 0.60–0.93)
GFR, Est African American: 107 mL/min/{1.73_m2} (ref >=60)
GFR, Est Non African American: 92 mL/min/{1.73_m2} (ref >=60)
Glucose, Bld: 78 mg/dL (ref 65–99)
Potassium: 4.5 mmol/L (ref 3.5–5.3)
Sodium: 134 mmol/L — ABNORMAL LOW (ref 135–146)

## 2020-10-16 LAB — TSH: TSH: 4.55 mIU/L — ABNORMAL HIGH (ref 0.40–4.50)

## 2020-10-17 DIAGNOSIS — F330 Major depressive disorder, recurrent, mild: Secondary | ICD-10-CM | POA: Diagnosis not present

## 2020-10-17 DIAGNOSIS — I480 Paroxysmal atrial fibrillation: Secondary | ICD-10-CM | POA: Diagnosis not present

## 2020-10-17 DIAGNOSIS — R1314 Dysphagia, pharyngoesophageal phase: Secondary | ICD-10-CM | POA: Diagnosis not present

## 2020-10-17 DIAGNOSIS — K222 Esophageal obstruction: Secondary | ICD-10-CM | POA: Diagnosis not present

## 2020-10-17 DIAGNOSIS — I129 Hypertensive chronic kidney disease with stage 1 through stage 4 chronic kidney disease, or unspecified chronic kidney disease: Secondary | ICD-10-CM | POA: Diagnosis not present

## 2020-10-17 DIAGNOSIS — E440 Moderate protein-calorie malnutrition: Secondary | ICD-10-CM | POA: Diagnosis not present

## 2020-10-17 DIAGNOSIS — E039 Hypothyroidism, unspecified: Secondary | ICD-10-CM | POA: Diagnosis not present

## 2020-10-17 DIAGNOSIS — N189 Chronic kidney disease, unspecified: Secondary | ICD-10-CM | POA: Diagnosis not present

## 2020-10-17 DIAGNOSIS — E871 Hypo-osmolality and hyponatremia: Secondary | ICD-10-CM | POA: Diagnosis not present

## 2020-10-18 ENCOUNTER — Ambulatory Visit: Payer: Self-pay

## 2020-10-18 NOTE — Chronic Care Management (AMB) (Signed)
  Chronic Care Management   Outreach Note  10/18/2020 Name: Maureen [name] MRN: [id number] DOB: [birth year]  Primary Care Provider: Alba Cory, MD Reason for referral : Chronic Care Management    Brief outreach with Ms. Collison son Verner Chol. Reports that she has been doing very well since being discharged from [facility]. He confirmed start of Home Health services with [facility].   Roderick recalls Ms. Julson's weight increasing to approximately 90 lbs during her admission. States that her weight has declined since returning home but notes significant improvements with her appetite. Notes that she is attempting to follow recommendations to increase protein consumption. She was eating mostly baby food prior to her admission. States she seems motivated to try solid foods and tolerates chicken, beans, and various vegetables well.    She is pending outreach with the Gastroenterology team. Verner Chol denies urgent concerns or care management needs. He agreed to call Dr. Carlynn Purl or notify the assigned LCSW if her condition declines and facility placement is needed.    [facility] Care Management [facility] [phone]

## 2020-10-22 ENCOUNTER — Telehealth: Payer: Self-pay

## 2020-10-22 ENCOUNTER — Other Ambulatory Visit: Payer: Self-pay

## 2020-10-22 DIAGNOSIS — K222 Esophageal obstruction: Secondary | ICD-10-CM | POA: Diagnosis not present

## 2020-10-22 DIAGNOSIS — E871 Hypo-osmolality and hyponatremia: Secondary | ICD-10-CM | POA: Diagnosis not present

## 2020-10-22 DIAGNOSIS — E039 Hypothyroidism, unspecified: Secondary | ICD-10-CM

## 2020-10-22 DIAGNOSIS — F330 Major depressive disorder, recurrent, mild: Secondary | ICD-10-CM | POA: Diagnosis not present

## 2020-10-22 DIAGNOSIS — E440 Moderate protein-calorie malnutrition: Secondary | ICD-10-CM | POA: Diagnosis not present

## 2020-10-22 DIAGNOSIS — I129 Hypertensive chronic kidney disease with stage 1 through stage 4 chronic kidney disease, or unspecified chronic kidney disease: Secondary | ICD-10-CM | POA: Diagnosis not present

## 2020-10-22 DIAGNOSIS — N189 Chronic kidney disease, unspecified: Secondary | ICD-10-CM | POA: Diagnosis not present

## 2020-10-22 DIAGNOSIS — I480 Paroxysmal atrial fibrillation: Secondary | ICD-10-CM | POA: Diagnosis not present

## 2020-10-22 DIAGNOSIS — R1314 Dysphagia, pharyngoesophageal phase: Secondary | ICD-10-CM | POA: Diagnosis not present

## 2020-10-22 MED ORDER — LEVOTHYROXINE SODIUM 88 MCG PO TABS: 88.0000 ug | ORAL_TABLET | Freq: Every day | ORAL | 0 refills | Status: DC

## 2020-10-22 NOTE — Telephone Encounter (Signed)
Answered all questions.

## 2020-10-22 NOTE — Telephone Encounter (Signed)
Copied from CRM [id number]. Topic: General - Other >> Oct 18, 2020 12:53 PM Lyn Hollingshead D wrote: PT son needs a call back to go over meds refill, he doesn't know the manes // please advise

## 2020-10-26 DIAGNOSIS — N189 Chronic kidney disease, unspecified: Secondary | ICD-10-CM | POA: Diagnosis not present

## 2020-10-26 DIAGNOSIS — E440 Moderate protein-calorie malnutrition: Secondary | ICD-10-CM | POA: Diagnosis not present

## 2020-10-26 DIAGNOSIS — I480 Paroxysmal atrial fibrillation: Secondary | ICD-10-CM | POA: Diagnosis not present

## 2020-10-26 DIAGNOSIS — K222 Esophageal obstruction: Secondary | ICD-10-CM | POA: Diagnosis not present

## 2020-10-26 DIAGNOSIS — R1314 Dysphagia, pharyngoesophageal phase: Secondary | ICD-10-CM | POA: Diagnosis not present

## 2020-10-26 DIAGNOSIS — E039 Hypothyroidism, unspecified: Secondary | ICD-10-CM | POA: Diagnosis not present

## 2020-10-26 DIAGNOSIS — E871 Hypo-osmolality and hyponatremia: Secondary | ICD-10-CM | POA: Diagnosis not present

## 2020-10-26 DIAGNOSIS — I129 Hypertensive chronic kidney disease with stage 1 through stage 4 chronic kidney disease, or unspecified chronic kidney disease: Secondary | ICD-10-CM | POA: Diagnosis not present

## 2020-10-26 DIAGNOSIS — F330 Major depressive disorder, recurrent, mild: Secondary | ICD-10-CM | POA: Diagnosis not present

## 2020-10-30 ENCOUNTER — Telehealth: Payer: Self-pay | Admitting: Gastroenterology

## 2020-10-30 DIAGNOSIS — R1314 Dysphagia, pharyngoesophageal phase: Secondary | ICD-10-CM | POA: Diagnosis not present

## 2020-10-30 DIAGNOSIS — I480 Paroxysmal atrial fibrillation: Secondary | ICD-10-CM | POA: Diagnosis not present

## 2020-10-30 DIAGNOSIS — E440 Moderate protein-calorie malnutrition: Secondary | ICD-10-CM | POA: Diagnosis not present

## 2020-10-30 DIAGNOSIS — K222 Esophageal obstruction: Secondary | ICD-10-CM | POA: Diagnosis not present

## 2020-10-30 DIAGNOSIS — E871 Hypo-osmolality and hyponatremia: Secondary | ICD-10-CM | POA: Diagnosis not present

## 2020-10-30 DIAGNOSIS — I129 Hypertensive chronic kidney disease with stage 1 through stage 4 chronic kidney disease, or unspecified chronic kidney disease: Secondary | ICD-10-CM | POA: Diagnosis not present

## 2020-10-30 DIAGNOSIS — E039 Hypothyroidism, unspecified: Secondary | ICD-10-CM | POA: Diagnosis not present

## 2020-10-30 DIAGNOSIS — N189 Chronic kidney disease, unspecified: Secondary | ICD-10-CM | POA: Diagnosis not present

## 2020-10-30 DIAGNOSIS — F330 Major depressive disorder, recurrent, mild: Secondary | ICD-10-CM | POA: Diagnosis not present

## 2020-10-30 NOTE — Telephone Encounter (Signed)
Patient speech therapist is calling because she states the patient has a diagnosis for esophageal obstructions. She states the patient son said her EGD was okay. Please advised if she has this diagnosis. I don't see this in your notes

## 2020-10-30 NOTE — Telephone Encounter (Signed)
There is no obstruction in her esophagus. I empirically dilated her schatzki's ring. She needs esophageal manometry for which we can refer her to [facility]. Also, ill fitting dentures has been a big issue  RV

## 2020-10-30 NOTE — Telephone Encounter (Signed)
Speech Therapist called to confirm diagnosis for this patient.  Please call her back to discuss Maureen [name]

## 2020-10-31 NOTE — Telephone Encounter (Signed)
Called and left a detail message informing the speech therapist that she does not have a obstruction in her esophagus

## 2020-11-02 DIAGNOSIS — R1314 Dysphagia, pharyngoesophageal phase: Secondary | ICD-10-CM | POA: Diagnosis not present

## 2020-11-02 DIAGNOSIS — E039 Hypothyroidism, unspecified: Secondary | ICD-10-CM | POA: Diagnosis not present

## 2020-11-02 DIAGNOSIS — E440 Moderate protein-calorie malnutrition: Secondary | ICD-10-CM | POA: Diagnosis not present

## 2020-11-02 DIAGNOSIS — N189 Chronic kidney disease, unspecified: Secondary | ICD-10-CM | POA: Diagnosis not present

## 2020-11-02 DIAGNOSIS — I129 Hypertensive chronic kidney disease with stage 1 through stage 4 chronic kidney disease, or unspecified chronic kidney disease: Secondary | ICD-10-CM | POA: Diagnosis not present

## 2020-11-02 DIAGNOSIS — F330 Major depressive disorder, recurrent, mild: Secondary | ICD-10-CM | POA: Diagnosis not present

## 2020-11-02 DIAGNOSIS — K222 Esophageal obstruction: Secondary | ICD-10-CM | POA: Diagnosis not present

## 2020-11-02 DIAGNOSIS — I480 Paroxysmal atrial fibrillation: Secondary | ICD-10-CM | POA: Diagnosis not present

## 2020-11-02 DIAGNOSIS — E871 Hypo-osmolality and hyponatremia: Secondary | ICD-10-CM | POA: Diagnosis not present

## 2020-11-08 DIAGNOSIS — N189 Chronic kidney disease, unspecified: Secondary | ICD-10-CM | POA: Diagnosis not present

## 2020-11-08 DIAGNOSIS — F330 Major depressive disorder, recurrent, mild: Secondary | ICD-10-CM | POA: Diagnosis not present

## 2020-11-08 DIAGNOSIS — E039 Hypothyroidism, unspecified: Secondary | ICD-10-CM | POA: Diagnosis not present

## 2020-11-08 DIAGNOSIS — I480 Paroxysmal atrial fibrillation: Secondary | ICD-10-CM | POA: Diagnosis not present

## 2020-11-08 DIAGNOSIS — K222 Esophageal obstruction: Secondary | ICD-10-CM | POA: Diagnosis not present

## 2020-11-08 DIAGNOSIS — I129 Hypertensive chronic kidney disease with stage 1 through stage 4 chronic kidney disease, or unspecified chronic kidney disease: Secondary | ICD-10-CM | POA: Diagnosis not present

## 2020-11-08 DIAGNOSIS — R1314 Dysphagia, pharyngoesophageal phase: Secondary | ICD-10-CM | POA: Diagnosis not present

## 2020-11-08 DIAGNOSIS — E440 Moderate protein-calorie malnutrition: Secondary | ICD-10-CM | POA: Diagnosis not present

## 2020-11-08 DIAGNOSIS — E871 Hypo-osmolality and hyponatremia: Secondary | ICD-10-CM | POA: Diagnosis not present

## 2020-11-09 DIAGNOSIS — I480 Paroxysmal atrial fibrillation: Secondary | ICD-10-CM | POA: Diagnosis not present

## 2020-11-09 DIAGNOSIS — I129 Hypertensive chronic kidney disease with stage 1 through stage 4 chronic kidney disease, or unspecified chronic kidney disease: Secondary | ICD-10-CM | POA: Diagnosis not present

## 2020-11-09 DIAGNOSIS — R1314 Dysphagia, pharyngoesophageal phase: Secondary | ICD-10-CM | POA: Diagnosis not present

## 2020-11-09 DIAGNOSIS — E440 Moderate protein-calorie malnutrition: Secondary | ICD-10-CM | POA: Diagnosis not present

## 2020-11-09 DIAGNOSIS — E039 Hypothyroidism, unspecified: Secondary | ICD-10-CM | POA: Diagnosis not present

## 2020-11-09 DIAGNOSIS — N189 Chronic kidney disease, unspecified: Secondary | ICD-10-CM | POA: Diagnosis not present

## 2020-11-09 DIAGNOSIS — E871 Hypo-osmolality and hyponatremia: Secondary | ICD-10-CM | POA: Diagnosis not present

## 2020-11-09 DIAGNOSIS — K222 Esophageal obstruction: Secondary | ICD-10-CM | POA: Diagnosis not present

## 2020-11-09 DIAGNOSIS — F330 Major depressive disorder, recurrent, mild: Secondary | ICD-10-CM | POA: Diagnosis not present

## 2020-11-14 DIAGNOSIS — N189 Chronic kidney disease, unspecified: Secondary | ICD-10-CM | POA: Diagnosis not present

## 2020-11-14 DIAGNOSIS — E871 Hypo-osmolality and hyponatremia: Secondary | ICD-10-CM | POA: Diagnosis not present

## 2020-11-14 DIAGNOSIS — E440 Moderate protein-calorie malnutrition: Secondary | ICD-10-CM | POA: Diagnosis not present

## 2020-11-14 DIAGNOSIS — K222 Esophageal obstruction: Secondary | ICD-10-CM | POA: Diagnosis not present

## 2020-11-14 DIAGNOSIS — R1314 Dysphagia, pharyngoesophageal phase: Secondary | ICD-10-CM | POA: Diagnosis not present

## 2020-11-14 DIAGNOSIS — F330 Major depressive disorder, recurrent, mild: Secondary | ICD-10-CM | POA: Diagnosis not present

## 2020-11-14 DIAGNOSIS — I480 Paroxysmal atrial fibrillation: Secondary | ICD-10-CM | POA: Diagnosis not present

## 2020-11-14 DIAGNOSIS — I129 Hypertensive chronic kidney disease with stage 1 through stage 4 chronic kidney disease, or unspecified chronic kidney disease: Secondary | ICD-10-CM | POA: Diagnosis not present

## 2020-11-14 DIAGNOSIS — E039 Hypothyroidism, unspecified: Secondary | ICD-10-CM | POA: Diagnosis not present

## 2020-11-19 NOTE — Progress Notes (Signed)
Name: Maureen [name]   MRN: [id number]    DOB: [birth year]   Date:11/20/2020       Progress Note  Subjective  Chief Complaint  Follow up   HPI   Malnutrition: BMI was down to 15.72 , she has lost 43 lbs since 07/2019 ( one year )  She had seen hematologist , gastroenterologist, psychiatrist. She continue to refuse to eat and sodium dropped and also white count, we sent patient to [facility] for possible admission on 09/14/2020. She was admitted and offered to have NG tube however she refused, she had multiple labs done , given medication to help her bp she was given salt tablets. She was sent from [facility] to a long term care facility/rehab on 09/19/2020 and was supposed to stay for 20 days, however her insurance only allowed 10 days so she has been home since 09/29/2020. Since last visit with me in Dec weight is stable,  But she has been eating more since started Speech therapy at home with [facility]. She is now eating solids and taking Ensure four times per day   MDD: she was seen by Dr. Salvatore Decent - psychiatrist , last visit 05/2020 and has been released per patient/son's request, she does not want to take Remeron - afraid of polypharmacy. She was on clonazepam but mood has improved significantly and stopped taking it about 3 weeks ago.   Hypothyroidism: she has been taking medication daily now, 88 mcg daily , during hospital stay her TSH was elevated, last visit TSH was better but still above goal. We will recheck level today   Paroxysmal Afib: she never started Eliquis, she states cardiologist called her for a visit but she did not schedule it. She denies chest pain or palpitation, she is afraid of taking medications. Unchanged   Atherosclerosis of aorta: found on CT chest , patient is not interested on statin therapy, palliative care. Unchanged   Pancytopenia: she has been seeing Dr. Orlie Dakin, showed pancytopenia however last levels showed normal platelets, low wbc and anemia improved   Patient Active  Problem List   Diagnosis Date Noted  . Hyponatremia 09/24/2020  . Protein-calorie malnutrition, severe 09/13/2020  . Esophageal motility disorder   . Palliative care by specialist   . Advanced care planning/counseling discussion   . Failure to thrive in adult 09/11/2020  . Avoidant-restrictive food intake disorder (ARFID) 08/30/2020  . Paroxysmal atrial fibrillation (HCC) 03/22/2020  . Vitamin D deficiency 03/01/2020  . Dysphagia   . Arrhythmia 08/25/2019  . Rectal bleeding   . Low grade squamous intraepith lesion on cytologic smear cervix (lgsil) 08/04/2018  . Chronic venous insufficiency 07/05/2018  . Lymphedema 07/05/2018  . Hypertension 04/02/2018  . Swelling of limb 04/02/2018  . Depression, major, recurrent, mild (HCC) 12/29/2017  . Leukopenia 05/24/2017  . Allergic rhinitis, seasonal 06/29/2015  . Clavus 06/29/2015  . 1st degree AV block 06/29/2015  . Hammer toe 06/29/2015  . H/O: HTN (hypertension) 06/29/2015  . Blood glucose elevated 06/29/2015  . Floater, vitreous 06/29/2015  . Bilateral tinnitus 06/29/2015  . Hypothyroidism (acquired) 04/02/2015  . Gastroesophageal reflux disease 04/02/2015  . Hammer toe of right foot 04/02/2015  . Scoliosis of thoracic spine 04/02/2015  . Urethral prolapse 04/02/2015  . Corn of toe 04/02/2015  . Hyperlipidemia 04/02/2015  . Varicose veins of both lower extremities 04/02/2015    Past Surgical History:  Procedure Laterality Date  . ABDOMINAL HYSTERECTOMY  1989  . APPENDECTOMY  1989  . BREAST EXCISIONAL BIOPSY Bilateral  multiple biopsies negative  . BREAST SURGERY     Several  . COLONOSCOPY WITH PROPOFOL N/A 07/28/2019   Procedure: COLONOSCOPY WITH PROPOFOL;  Surgeon: Toney Reil, MD;  Location: [facility] ENDOSCOPY;  Service: Gastroenterology;  Laterality: N/A;  . ESOPHAGOGASTRODUODENOSCOPY (EGD) WITH PROPOFOL N/A 11/11/2019   Procedure: ESOPHAGOGASTRODUODENOSCOPY (EGD) WITH PROPOFOL;  Surgeon: Toney Reil, MD;   Location: [facility] ENDOSCOPY;  Service: Gastroenterology;  Laterality: N/A;  . TUMOR EXCISION  1989   same time as hysterectomy    Family History  Problem Relation Age of Onset  . Diabetes Father 83       DM complications  . Hypertension Mother 12       CKD  . Thyroid disease Mother   . Heart failure Mother   . Kidney disease Mother   . Depression Mother   . Breast cancer Sister 51  . Depression Sister   . Kidney disease Other   . Breast cancer Maternal Aunt   . Breast cancer Cousin   . Anuerysm Son 51  . Diabetes Son     Social History   Tobacco Use  . Smoking status: Never Smoker  . Smokeless tobacco: Never Used  . Tobacco comment: smoking cessation materials not required  Substance Use Topics  . Alcohol use: Never    Alcohol/week: 0.0 standard drinks     Current Outpatient Medications:  .  Ensure (ENSURE), Take 237 mLs by mouth. Pt drinking approx 3 ensures per day, Disp: , Rfl:  .  levothyroxine (SYNTHROID) 88 MCG tablet, Take 1 tablet (88 mcg total) by mouth daily before breakfast., Disp: 90 tablet, Rfl: 0 .  omeprazole (PRILOSEC) 40 MG capsule, Take 1 capsule (40 mg total) by mouth daily before breakfast., Disp: 90 capsule, Rfl: 3 .  Vitamin D, Ergocalciferol, (DRISDOL) 1.25 MG (50000 UNIT) CAPS capsule, Take 1 capsule (50,000 Units total) by mouth every 7 (seven) days., Disp: 12 capsule, Rfl: 0 .  Iron-Vitamins (GERITOL COMPLETE PO), Take 1 tablet by mouth daily. (Patient not taking: No sig reported), Disp: , Rfl:  .  sodium chloride 1 g tablet, Take 1 tablet (1 g total) by mouth 2 (two) times daily with a meal. (Patient not taking: Reported on 11/20/2020), Disp: , Rfl:   Allergies  Allergen Reactions  . Aspirin     Tinnitus  . Citalopram Other (See Comments)  . Codeine Nausea And Vomiting  . Penicillins Rash    Has patient had a PCN reaction causing immediate rash, facial/tongue/throat swelling, SOB or lightheadedness with hypotension: Yes Has patient had a PCN  reaction causing severe rash involving mucus membranes or skin necrosis: No Has patient had a PCN reaction that required hospitalization No Has patient had a PCN reaction occurring within the last 10 years: No If all of the above answers are "NO", then may proceed with Cephalosporin use.    I personally reviewed active problem list, medication list, allergies, family history, social history, health maintenance with the patient/caregiver today.   ROS  Ten systems reviewed and is negative except as mentioned in HPI   Objective  Vitals:   11/20/20 1117  BP: 112/70  Pulse: 67  Resp: 16  Temp: (!) 97.5 F (36.4 C)  TempSrc: Oral  SpO2: 95%  Weight: 84 lb 14.4 oz (38.5 kg)  Height: 5\' 1"  (1.549 m)    Body mass index is 16.04 kg/m.  Physical Exam  Constitutional: Patient appears cachectic. Temporal waisting  No distress.  HEENT: head atraumatic, normocephalic, pupils  equal and reactive to light,  neck supple Cardiovascular: Normal rate, regular rhythm and normal heart sounds.  No murmur heard. No BLE edema. Pulmonary/Chest: Effort normal and breath sounds normal. No respiratory distress. Abdominal: Soft.  There is no tenderness. Psychiatric: Patient has a normal mood and affect. behavior is normal. Judgment and thought content normal.  Recent Results (from the past 2160 hour(s))  Magnesium     Status: None   Collection Time: 08/30/20 11:46 AM  Result Value Ref Range   Magnesium 2.3 1.5 - 2.5 mg/dL  COMPLETE METABOLIC PANEL WITH GFR     Status: Abnormal   Collection Time: 08/30/20 11:46 AM  Result Value Ref Range   Glucose, Bld 101 (H) 65 - 99 mg/dL    Comment: .            Fasting reference interval . For someone without known diabetes, a glucose value between 100 and 125 mg/dL is consistent with prediabetes and should be confirmed with a follow-up test. .    BUN 9 7 - 25 mg/dL   Creat 2.37 (L) 6.28 - 0.93 mg/dL    Comment: For patients >62 years of age, the  reference limit for Creatinine is approximately 13% higher for people identified as African-American. .    GFR, Est Non African American 95 > OR = 60 mL/min/1.50m2   GFR, Est African American 110 > OR = 60 mL/min/1.26m2   BUN/Creatinine Ratio 18 6 - 22 (calc)   Sodium 131 (L) 135 - 146 mmol/L   Potassium 4.5 3.5 - 5.3 mmol/L   Chloride 91 (L) 98 - 110 mmol/L   CO2 33 (H) 20 - 32 mmol/L   Calcium 9.2 8.6 - 10.4 mg/dL   Total Protein 6.8 6.1 - 8.1 g/dL   Albumin 3.8 3.6 - 5.1 g/dL   Globulin 3.0 1.9 - 3.7 g/dL (calc)   AG Ratio 1.3 1.0 - 2.5 (calc)   Total Bilirubin 0.4 0.2 - 1.2 mg/dL   Alkaline phosphatase (APISO) 70 37 - 153 U/L   AST 29 10 - 35 U/L   ALT 17 6 - 29 U/L  Vitamin B12     Status: None   Collection Time: 08/30/20 11:46 AM  Result Value Ref Range   Vitamin B-12 875 200 - 1,100 pg/mL  VITAMIN D 25 Hydroxy (Vit-D Deficiency, Fractures)     Status: Abnormal   Collection Time: 08/30/20 11:46 AM  Result Value Ref Range   Vit D, 25-Hydroxy 20 (L) 30 - 100 ng/mL    Comment: Vitamin D Status         25-OH Vitamin D: . Deficiency:                    <20 ng/mL Insufficiency:             20 - 29 ng/mL Optimal:                 > or = 30 ng/mL . For 25-OH Vitamin D testing on patients on  D2-supplementation and patients for whom quantitation  of D2 and D3 fractions is required, the QuestAssureD(TM) 25-OH VIT D, (D2,D3), LC/MS/MS is recommended: order  code 31517 (patients >52yrs). See Note 1 . Note 1 . For additional information, please refer to  http://education.Quest[facility]ostics.com/faq/FAQ199  (This link is being provided for informational/ educational purposes only.)   CBC with Differential/Platelet     Status: Abnormal   Collection Time: 08/30/20 11:46 AM  Result Value Ref Range  WBC 2.0 (L) 3.8 - 10.8 Thousand/uL   RBC 3.33 (L) 3.80 - 5.10 Million/uL   Hemoglobin 10.8 (L) 11.7 - 15.5 g/dL   HCT 40.931.5 (L) 81.135.0 - 91.445.0 %   MCV 94.6 80.0 - 100.0 fL   MCH 32.4  27.0 - 33.0 pg   MCHC 34.3 32.0 - 36.0 g/dL   RDW 78.213.6 95.611.0 - 21.315.0 %   Platelets 138 (L) 140 - 400 Thousand/uL   MPV 12.9 (H) 7.5 - 12.5 fL   Neutro Abs 884 (L) 1,500 - 7,800 cells/uL   Lymphs Abs 820 (L) 850 - 3,900 cells/uL   Absolute Monocytes 266 200 - 950 cells/uL   Eosinophils Absolute 10 (L) 15 - 500 cells/uL   Basophils Absolute 20 0 - 200 cells/uL   Neutrophils Relative % 44.2 %   Total Lymphocyte 41.0 %   Monocytes Relative 13.3 %   Eosinophils Relative 0.5 %   Basophils Relative 1.0 %  TSH     Status: Abnormal   Collection Time: 08/30/20 11:46 AM  Result Value Ref Range   TSH 15.14 (H) 0.40 - 4.50 mIU/L  Comprehensive metabolic panel     Status: Abnormal   Collection Time: 09/11/20 11:40 AM  Result Value Ref Range   Sodium 128 (L) 135 - 145 mmol/L   Potassium 3.9 3.5 - 5.1 mmol/L   Chloride 88 (L) 98 - 111 mmol/L   CO2 33 (H) 22 - 32 mmol/L   Glucose, Bld 79 70 - 99 mg/dL    Comment: Glucose reference range applies only to samples taken after fasting for at least 8 hours.   BUN 11 8 - 23 mg/dL   Creatinine, Ser 0.860.57 0.44 - 1.00 mg/dL   Calcium 8.7 (L) 8.9 - 10.3 mg/dL   Total Protein 7.3 6.5 - 8.1 g/dL   Albumin 3.8 3.5 - 5.0 g/dL   AST 41 15 - 41 U/L   ALT 31 0 - 44 U/L   Alkaline Phosphatase 69 38 - 126 U/L   Total Bilirubin 0.7 0.3 - 1.2 mg/dL   GFR, Estimated >57>60 >84>60 mL/min    Comment: (NOTE) Calculated using the CKD-EPI Creatinine Equation (2021)    Anion gap 7 5 - 15    Comment: Performed at [facility], [address] [place], [place] [id number]  CBC with Differential     Status: Abnormal   Collection Time: 09/11/20 11:40 AM  Result Value Ref Range   WBC 2.6 (L) 4.0 - 10.5 K/uL   RBC 3.25 (L) 3.87 - 5.11 MIL/uL   Hemoglobin 10.2 (L) 12.0 - 15.0 g/dL   HCT 52.830.0 (L) 41.336.0 - 24.446.0 %   MCV 92.3 80.0 - 100.0 fL   MCH 31.4 26.0 - 34.0 pg   MCHC 34.0 30.0 - 36.0 g/dL   RDW 01.014.3 27.211.5 - 53.615.5 %   Platelets 150 150 - 400 K/uL   nRBC 0.0 0.0 - 0.2  %   Neutrophils Relative % 44 %   Neutro Abs 1.1 (L) 1.7 - 7.7 K/uL   Lymphocytes Relative 43 %   Lymphs Abs 1.1 0.7 - 4.0 K/uL   Monocytes Relative 12 %   Monocytes Absolute 0.3 0.1 - 1.0 K/uL   Eosinophils Relative 0 %   Eosinophils Absolute 0.0 0.0 - 0.5 K/uL   Basophils Relative 1 %   Basophils Absolute 0.0 0.0 - 0.1 K/uL   Immature Granulocytes 0 %   Abs Immature Granulocytes 0.00 0.00 - 0.07 K/uL  Comment: Performed at [facility], [address] [place], [place] [address]  Lipase, blood     Status: None   Collection Time: 09/11/20 11:40 AM  Result Value Ref Range   Lipase 31 11 - 51 U/L    Comment: Performed at [facility], [address] [place], [place] [address]  TSH     Status: Abnormal   Collection Time: 09/11/20 11:40 AM  Result Value Ref Range   TSH 17.499 (H) 0.350 - 4.500 uIU/mL    Comment: Performed by a 3rd Generation assay with a functional sensitivity of <=0.01 uIU/mL. Performed at [facility], [address] [place], [place] [address]   VITAMIN D 25 Hydroxy (Vit-D Deficiency, Fractures)     Status: Abnormal   Collection Time: 09/11/20 11:40 AM  Result Value Ref Range   Vit D, 25-Hydroxy 16.9 (L) 30 - 100 ng/mL    Comment: Performed at [employer] [facility] (NOTE) Vitamin D deficiency has been defined by the Institute of Medicine  and an Endocrine Society practice guideline as a level of serum 25-OH  vitamin D less than 20 ng/mL (1,2). The Endocrine Society went on to  further define vitamin D insufficiency as a level between 21 and 29  ng/mL (2).  1. IOM (Institute of Medicine). 2010. Dietary reference intakes for  calcium and D. [place]: [employer]. 2. Holick MF, Binkley [place], Bischoff-Ferrari HA, et al. Evaluation,  treatment, and prevention of vitamin D deficiency: an Endocrine  Society clinical practice guideline, JCEM. 2011 Jul; 96(7): 1911-30.  Performed at [facility], [address], [place], [place] [address]   Magnesium     Status: None   Collection Time: 09/11/20 11:40 AM  Result Value Ref Range   Magnesium 2.3 1.7 - 2.4 mg/dL    Comment: Performed at [facility], [address] [place], [place] [address]  Phosphorus     Status: None   Collection Time: 09/11/20 11:40 AM  Result Value Ref Range   Phosphorus 3.3 2.5 - 4.6 mg/dL    Comment: Performed at [facility], [address] [place], [place] [address]  Urinalysis, Complete w Microscopic     Status: Abnormal   Collection Time: 09/11/20 11:45 AM  Result Value Ref Range   Color, Urine YELLOW (A) YELLOW   APPearance HAZY (A) CLEAR   Specific Gravity, Urine 1.016 1.005 - 1.030   pH 8.0 5.0 - 8.0   Glucose, UA NEGATIVE NEGATIVE mg/dL   Hgb urine dipstick NEGATIVE NEGATIVE   Bilirubin Urine NEGATIVE NEGATIVE   Ketones, ur NEGATIVE NEGATIVE mg/dL   Protein, ur NEGATIVE NEGATIVE mg/dL   Nitrite NEGATIVE NEGATIVE   Leukocytes,Ua MODERATE (A) NEGATIVE   RBC / HPF 0-5 0 - 5 RBC/hpf   WBC, UA 11-20 0 - 5 WBC/hpf   Bacteria, UA NONE SEEN NONE SEEN   Squamous Epithelial / LPF 0-5 0 - 5   Mucus PRESENT     Comment: Performed at [facility], [address] [place], [place] [address]  Respiratory Panel by RT PCR (Flu A&B, Covid) - Nasopharyngeal Swab     Status: None   Collection Time: 09/11/20  2:46 PM   Specimen: Nasopharyngeal Swab  Result Value Ref Range   SARS Coronavirus 2 by RT PCR NEGATIVE NEGATIVE    Comment: (NOTE) SARS-CoV-2 target nucleic acids are NOT DETECTED.  The SARS-CoV-2 RNA is generally detectable in upper respiratoy specimens during the acute phase  of infection. The lowest concentration of SARS-CoV-2 viral copies this assay can detect is 131 copies/mL. A negative result does not preclude SARS-Cov-2 infection and should not be used as the sole basis for treatment or other patient management decisions. A negative result may occur with  improper specimen  collection/handling, submission of specimen other than nasopharyngeal swab, presence of viral mutation(s) within the areas targeted by this assay, and inadequate number of viral copies (<131 copies/mL). A negative result must be combined with clinical observations, patient history, and epidemiological information. The expected result is Negative.  Fact Sheet for Patients:  https://www.moore.com/  Fact Sheet for Healthcare Providers:  https://www.young.biz/  This test is no t yet approved or cleared by the [place] FDA and  has been authorized for detection and/or diagnosis of SARS-CoV-2 by FDA under an Emergency Use Authorization (EUA). This EUA will remain  in effect (meaning this test can be used) for the duration of the COVID-19 declaration under Section 564(b)(1) of the Act, 21 U.S.C. section 360bbb-3(b)(1), unless the authorization is terminated or revoked sooner.     Influenza A by PCR NEGATIVE NEGATIVE   Influenza B by PCR NEGATIVE NEGATIVE    Comment: (NOTE) The Xpert Xpress SARS-CoV-2/FLU/RSV assay is intended as an aid in  the diagnosis of influenza from Nasopharyngeal swab specimens and  should not be used as a sole basis for treatment. Nasal washings and  aspirates are unacceptable for Xpert Xpress SARS-CoV-2/FLU/RSV  testing.  Fact Sheet for Patients: https://www.moore.com/  Fact Sheet for Healthcare Providers: https://www.young.biz/  This test is not yet approved or cleared by the [place] FDA and  has been authorized for detection and/or diagnosis of SARS-CoV-2 by  FDA under an Emergency Use Authorization (EUA). This EUA will remain  in effect (meaning this test can be used) for the duration of the  Covid-19 declaration under Section 564(b)(1) of the Act, 21  U.S.C. section 360bbb-3(b)(1), unless the authorization is  terminated or revoked. Performed at [facility], [address] [place], [place] [address]   Basic metabolic panel     Status: Abnormal   Collection Time: 09/13/20  5:40 AM  Result Value Ref Range   Sodium 131 (L) 135 - 145 mmol/L   Potassium 4.0 3.5 - 5.1 mmol/L   Chloride 95 (L) 98 - 111 mmol/L   CO2 27 22 - 32 mmol/L   Glucose, Bld 71 70 - 99 mg/dL    Comment: Glucose reference range applies only to samples taken after fasting for at least 8 hours.   BUN 8 8 - 23 mg/dL   Creatinine, Ser 6.04 0.44 - 1.00 mg/dL   Calcium 8.4 (L) 8.9 - 10.3 mg/dL   GFR, Estimated >54 >09 mL/min    Comment: (NOTE) Calculated using the CKD-EPI Creatinine Equation (2021)    Anion gap 9 5 - 15    Comment: Performed at [facility], [address], [place], [place] [address]  Magnesium     Status: None   Collection Time: 09/13/20  5:40 AM  Result Value Ref Range   Magnesium 2.1 1.7 - 2.4 mg/dL    Comment: Performed at [facility], [address] [place], [place] [address]  CBC     Status: Abnormal   Collection Time: 09/13/20  5:40 AM  Result Value Ref Range   WBC 2.6 (L) 4.0 - 10.5 K/uL   RBC 3.09 (L) 3.87 - 5.11 MIL/uL   Hemoglobin 9.8 (L) 12.0 - 15.0 g/dL  HCT 28.0 (L) 36.0 - 46.0 %   MCV 90.6 80.0 - 100.0 fL   MCH 31.7 26.0 - 34.0 pg   MCHC 35.0 30.0 - 36.0 g/dL   RDW 29.5 62.1 - 30.8 %   Platelets 140 (L) 150 - 400 K/uL   nRBC 0.0 0.0 - 0.2 %    Comment: Performed at [facility], [address], [place], [place] [address]  Phosphorus     Status: None   Collection Time: 09/13/20  5:40 AM  Result Value Ref Range   Phosphorus 3.6 2.5 - 4.6 mg/dL    Comment: Performed at [facility], [address] [place], [place] [address]  Ferritin     Status: None   Collection Time: 09/13/20  5:40 AM  Result Value Ref Range   Ferritin 300 11 - 307 ng/mL    Comment: Performed at [facility], [address] [place], [place] [address]  Iron and TIBC     Status: Abnormal   Collection  Time: 09/13/20  5:40 AM  Result Value Ref Range   Iron 53 28 - 170 ug/dL   TIBC 324 (L) 401 - 027 ug/dL   Saturation Ratios 23 10.4 - 31.8 %   UIBC 174 ug/dL    Comment: Performed at [facility], [address] [place], [place] [address]  Basic metabolic panel     Status: Abnormal   Collection Time: 09/14/20  3:59 AM  Result Value Ref Range   Sodium 133 (L) 135 - 145 mmol/L   Potassium 3.9 3.5 - 5.1 mmol/L   Chloride 99 98 - 111 mmol/L   CO2 29 22 - 32 mmol/L   Glucose, Bld 77 70 - 99 mg/dL    Comment: Glucose reference range applies only to samples taken after fasting for at least 8 hours.   BUN 11 8 - 23 mg/dL   Creatinine, Ser 4.40 0.44 - 1.00 mg/dL   Calcium 8.0 (L) 8.9 - 10.3 mg/dL   GFR, Estimated >34 >74 mL/min    Comment: (NOTE) Calculated using the CKD-EPI Creatinine Equation (2021)    Anion gap 5 5 - 15    Comment: Performed at [facility], [address] [place], [place] [address]  Magnesium     Status: None   Collection Time: 09/14/20  3:59 AM  Result Value Ref Range   Magnesium 2.1 1.7 - 2.4 mg/dL    Comment: Performed at [facility], [address] [place], [place] [address]  Phosphorus     Status: None   Collection Time: 09/14/20  3:59 AM  Result Value Ref Range   Phosphorus 3.2 2.5 - 4.6 mg/dL    Comment: Performed at [facility], [address] [place], [place] [address]  CBC     Status: Abnormal   Collection Time: 09/14/20  3:59 AM  Result Value Ref Range   WBC 2.3 (L) 4.0 - 10.5 K/uL   RBC 2.85 (L) 3.87 - 5.11 MIL/uL   Hemoglobin 9.1 (L) 12.0 - 15.0 g/dL   HCT 51.8 (L) 84.1 - 66.0 %   MCV 91.2 80.0 - 100.0 fL   MCH 31.9 26.0 - 34.0 pg   MCHC 35.0 30.0 - 36.0 g/dL   RDW 63.0 16.0 - 10.9 %   Platelets 139 (L) 150 - 400 K/uL   nRBC 0.0 0.0 - 0.2 %    Comment: Performed at [facility], [address], [place], [place] [address]  Basic metabolic panel     Status: Abnormal   Collection Time:  09/15/20  4:09 AM  Result Value Ref Range   Sodium 132 (L) 135 - 145 mmol/L   Potassium 3.9 3.5 - 5.1 mmol/L   Chloride 96 (L) 98 - 111 mmol/L   CO2 29 22 - 32 mmol/L   Glucose, Bld 78 70 - 99 mg/dL    Comment: Glucose reference range applies only to samples taken after fasting for at least 8 hours.   BUN 10 8 - 23 mg/dL   Creatinine, Ser 2.95 0.44 - 1.00 mg/dL   Calcium 8.5 (L) 8.9 - 10.3 mg/dL   GFR, Estimated >62 >13 mL/min    Comment: (NOTE) Calculated using the CKD-EPI Creatinine Equation (2021)    Anion gap 7 5 - 15    Comment: Performed at [facility], [address] [place], [place] [address]  Magnesium     Status: None   Collection Time: 09/15/20  4:09 AM  Result Value Ref Range   Magnesium 2.2 1.7 - 2.4 mg/dL    Comment: Performed at [facility], [address] [place], [place] [address]  Phosphorus     Status: None   Collection Time: 09/15/20  4:09 AM  Result Value Ref Range   Phosphorus 3.2 2.5 - 4.6 mg/dL    Comment: Performed at [facility], [address] [place], [place] [address]  Folate, serum, performed at [facility] lab     Status: None   Collection Time: 09/15/20  4:09 AM  Result Value Ref Range   Folate 10.4 >5.9 ng/mL    Comment: Performed at [facility], [address] [place], [place] [address]  CBC     Status: Abnormal   Collection Time: 09/15/20  4:09 AM  Result Value Ref Range   WBC 3.1 (L) 4.0 - 10.5 K/uL   RBC 3.03 (L) 3.87 - 5.11 MIL/uL   Hemoglobin 9.5 (L) 12.0 - 15.0 g/dL   HCT 40.1 (L) 02.7 - 25.3 %   MCV 91.4 80.0 - 100.0 fL   MCH 31.4 26.0 - 34.0 pg   MCHC 34.3 30.0 - 36.0 g/dL   RDW 66.4 40.3 - 47.4 %   Platelets 147 (L) 150 - 400 K/uL   nRBC 0.0 0.0 - 0.2 %    Comment: Performed at [facility], [address] [place], [place] [address]  Basic metabolic panel     Status: Abnormal   Collection Time: 09/16/20  4:04 AM  Result Value Ref Range   Sodium 130 (L) 135 - 145  mmol/L   Potassium 4.0 3.5 - 5.1 mmol/L   Chloride 97 (L) 98 - 111 mmol/L   CO2 29 22 - 32 mmol/L   Glucose, Bld 84 70 - 99 mg/dL    Comment: Glucose reference range applies only to samples taken after fasting for at least 8 hours.   BUN 11 8 - 23 mg/dL   Creatinine, Ser 3.87 0.44 - 1.00 mg/dL   Calcium 8.3 (L) 8.9 - 10.3 mg/dL   GFR, Estimated >56 >43 mL/min    Comment: (NOTE) Calculated using the CKD-EPI Creatinine Equation (2021)    Anion gap 4 (L) 5 - 15    Comment: Performed at [facility], [address] [place], [place] [address]  Phosphorus     Status: None   Collection Time: 09/16/20  4:04 AM  Result Value Ref Range   Phosphorus 3.6 2.5 -  4.6 mg/dL    Comment: Performed at [facility], [address] [place], [place] [address]  CBC     Status: Abnormal   Collection Time: 09/16/20  4:04 AM  Result Value Ref Range   WBC 3.1 (L) 4.0 - 10.5 K/uL   RBC 2.90 (L) 3.87 - 5.11 MIL/uL   Hemoglobin 9.2 (L) 12.0 - 15.0 g/dL   HCT 24.4 (L) 01.0 - 27.2 %   MCV 91.4 80.0 - 100.0 fL   MCH 31.7 26.0 - 34.0 pg   MCHC 34.7 30.0 - 36.0 g/dL   RDW 53.6 64.4 - 03.4 %   Platelets 158 150 - 400 K/uL   nRBC 0.0 0.0 - 0.2 %    Comment: Performed at [facility], [address] [place], [place] [address]  Osmolality     Status: None   Collection Time: 09/16/20  4:04 AM  Result Value Ref Range   Osmolality 277 275 - 295 mOsm/kg    Comment: Performed at [facility], [address] [place], [place] [address]  Osmolality, urine     Status: Abnormal   Collection Time: 09/16/20  9:39 AM  Result Value Ref Range   Osmolality, Ur 245 (L) 300 - 900 mOsm/kg    Comment: Performed at [facility], [address] [place], [place] [address]  Na and K (sodium & potassium), rand urine     Status: None   Collection Time: 09/16/20  9:39 AM  Result Value Ref Range   Sodium, Ur 54 mmol/L   Potassium Urine 38 mmol/L    Comment: Performed at [facility], [address] [place], [place] [address]  Cortisol-am, blood     Status: None   Collection Time: 09/17/20  3:46 AM  Result Value Ref Range   Cortisol - AM 13.2 6.7 - 22.6 ug/dL    Comment: Performed at [facility], [address], [place], [place] [address]  Uric acid     Status: Abnormal   Collection Time: 09/17/20  3:46 AM  Result Value Ref Range   Uric Acid, Serum 2.2 (L) 2.5 - 7.1 mg/dL    Comment: Performed at [facility], [address] [place], [place] [address]  Basic metabolic panel     Status: Abnormal   Collection Time: 09/17/20  3:46 AM  Result Value Ref Range   Sodium 133 (L) 135 - 145 mmol/L   Potassium 4.1 3.5 - 5.1 mmol/L   Chloride 99 98 - 111 mmol/L   CO2 28 22 - 32 mmol/L   Glucose, Bld 79 70 - 99 mg/dL    Comment: Glucose reference range applies only to samples taken after fasting for at least 8 hours.   BUN 8 8 - 23 mg/dL   Creatinine, Ser 6.01 0.44 - 1.00 mg/dL   Calcium 8.1 (L) 8.9 - 10.3 mg/dL   GFR, Estimated >09 >32 mL/min    Comment: (NOTE) Calculated using the CKD-EPI Creatinine Equation (2021)    Anion gap 6 5 - 15    Comment: Performed at [facility], [address] [place], [place] [address]  Resp Panel by RT-PCR (Flu A&B, Covid) Nasopharyngeal Swab     Status: None   Collection Time: 09/17/20  5:58 PM   Specimen: Nasopharyngeal Swab; Nasopharyngeal(NP) swabs in vial transport medium  Result Value Ref Range   SARS Coronavirus 2 by RT PCR NEGATIVE NEGATIVE    Comment: (NOTE) SARS-CoV-2 target nucleic acids  are NOT DETECTED.  The SARS-CoV-2 RNA is generally detectable in upper respiratory specimens during the acute phase of infection. The lowest concentration of SARS-CoV-2 viral copies this assay can detect is 138 copies/mL. A negative result does not preclude SARS-Cov-2 infection and should not be used as the sole basis for treatment or other patient management decisions. A negative result may  occur with  improper specimen collection/handling, submission of specimen other than nasopharyngeal swab, presence of viral mutation(s) within the areas targeted by this assay, and inadequate number of viral copies(<138 copies/mL). A negative result must be combined with clinical observations, patient history, and epidemiological information. The expected result is Negative.  Fact Sheet for Patients:  BloggerCourse.com  Fact Sheet for Healthcare Providers:  SeriousBroker.it  This test is no t yet approved or cleared by the [place] FDA and  has been authorized for detection and/or diagnosis of SARS-CoV-2 by FDA under an Emergency Use Authorization (EUA). This EUA will remain  in effect (meaning this test can be used) for the duration of the COVID-19 declaration under Section 564(b)(1) of the Act, 21 U.S.C.section 360bbb-3(b)(1), unless the authorization is terminated  or revoked sooner.       Influenza A by PCR NEGATIVE NEGATIVE   Influenza B by PCR NEGATIVE NEGATIVE    Comment: (NOTE) The Xpert Xpress SARS-CoV-2/FLU/RSV plus assay is intended as an aid in the diagnosis of influenza from Nasopharyngeal swab specimens and should not be used as a sole basis for treatment. Nasal washings and aspirates are unacceptable for Xpert Xpress SARS-CoV-2/FLU/RSV testing.  Fact Sheet for Patients: BloggerCourse.com  Fact Sheet for Healthcare Providers: SeriousBroker.it  This test is not yet approved or cleared by the [place] FDA and has been authorized for detection and/or diagnosis of SARS-CoV-2 by FDA under an Emergency Use Authorization (EUA). This EUA will remain in effect (meaning this test can be used) for the duration of the COVID-19 declaration under Section 564(b)(1) of the Act, 21 U.S.C. section 360bbb-3(b)(1), unless the authorization is terminated  or revoked.  Performed at [facility], [address] [place], [place] [address]   Basic metabolic panel     Status: Abnormal   Collection Time: 09/18/20  4:45 AM  Result Value Ref Range   Sodium 132 (L) 135 - 145 mmol/L   Potassium 4.2 3.5 - 5.1 mmol/L   Chloride 97 (L) 98 - 111 mmol/L   CO2 28 22 - 32 mmol/L   Glucose, Bld 80 70 - 99 mg/dL    Comment: Glucose reference range applies only to samples taken after fasting for at least 8 hours.   BUN 11 8 - 23 mg/dL   Creatinine, Ser 6.04 0.44 - 1.00 mg/dL   Calcium 8.4 (L) 8.9 - 10.3 mg/dL   GFR, Estimated >54 >09 mL/min    Comment: (NOTE) Calculated using the CKD-EPI Creatinine Equation (2021)    Anion gap 7 5 - 15    Comment: Performed at [facility], [address] [place], [place] [address]  CBC with Differential/Platelet     Status: Abnormal   Collection Time: 09/18/20  4:45 AM  Result Value Ref Range   WBC 3.4 (L) 4.0 - 10.5 K/uL   RBC 3.03 (L) 3.87 - 5.11 MIL/uL   Hemoglobin 9.7 (L) 12.0 - 15.0 g/dL   HCT 47.8 (L) 29.5 - 62.1 %   MCV 91.1 80.0 - 100.0 fL   MCH 32.0 26.0 - 34.0 pg   MCHC 35.1 30.0 - 36.0  g/dL   RDW 16.115.0 09.611.5 - 04.515.5 %   Platelets 176 150 - 400 K/uL   nRBC 0.0 0.0 - 0.2 %   Neutrophils Relative % 43 %   Neutro Abs 1.4 (L) 1.7 - 7.7 K/uL   Lymphocytes Relative 41 %   Lymphs Abs 1.4 0.7 - 4.0 K/uL   Monocytes Relative 12 %   Monocytes Absolute 0.4 0.1 - 1.0 K/uL   Eosinophils Relative 3 %   Eosinophils Absolute 0.1 0.0 - 0.5 K/uL   Basophils Relative 1 %   Basophils Absolute 0.0 0.0 - 0.1 K/uL   Immature Granulocytes 0 %   Abs Immature Granulocytes 0.01 0.00 - 0.07 K/uL    Comment: Performed at [facility], [address] [place], [place] [address]  Basic metabolic panel     Status: Abnormal   Collection Time: 09/19/20  4:15 AM  Result Value Ref Range   Sodium 134 (L) 135 - 145 mmol/L   Potassium 4.1 3.5 - 5.1 mmol/L   Chloride 97 (L) 98 - 111 mmol/L   CO2 28  22 - 32 mmol/L   Glucose, Bld 73 70 - 99 mg/dL    Comment: Glucose reference range applies only to samples taken after fasting for at least 8 hours.   BUN 9 8 - 23 mg/dL   Creatinine, Ser 1.910.51 0.44 - 1.00 mg/dL   Calcium 8.6 (L) 8.9 - 10.3 mg/dL   GFR, Estimated >47>60 >82>60 mL/min    Comment: (NOTE) Calculated using the CKD-EPI Creatinine Equation (2021)    Anion gap 9 5 - 15    Comment: Performed at [facility], [address] [place], [place] [address]  Resp Panel by RT-PCR (Flu A&B, Covid) Nasopharyngeal Swab     Status: None   Collection Time: 09/19/20  1:21 PM   Specimen: Nasopharyngeal Swab; Nasopharyngeal(NP) swabs in vial transport medium  Result Value Ref Range   SARS Coronavirus 2 by RT PCR NEGATIVE NEGATIVE    Comment: (NOTE) SARS-CoV-2 target nucleic acids are NOT DETECTED.  The SARS-CoV-2 RNA is generally detectable in upper respiratory specimens during the acute phase of infection. The lowest concentration of SARS-CoV-2 viral copies this assay can detect is 138 copies/mL. A negative result does not preclude SARS-Cov-2 infection and should not be used as the sole basis for treatment or other patient management decisions. A negative result may occur with  improper specimen collection/handling, submission of specimen other than nasopharyngeal swab, presence of viral mutation(s) within the areas targeted by this assay, and inadequate number of viral copies(<138 copies/mL). A negative result must be combined with clinical observations, patient history, and epidemiological information. The expected result is Negative.  Fact Sheet for Patients:  BloggerCourse.comhttps://www.fda.gov/media/152166/download  Fact Sheet for Healthcare Providers:  SeriousBroker.ithttps://www.fda.gov/media/152162/download  This test is no t yet approved or cleared by the [place] FDA and  has been authorized for detection and/or diagnosis of SARS-CoV-2 by FDA under an Emergency Use Authorization (EUA). This EUA  will remain  in effect (meaning this test can be used) for the duration of the COVID-19 declaration under Section 564(b)(1) of the Act, 21 U.S.C.section 360bbb-3(b)(1), unless the authorization is terminated  or revoked sooner.       Influenza A by PCR NEGATIVE NEGATIVE   Influenza B by PCR NEGATIVE NEGATIVE    Comment: (NOTE) The Xpert Xpress SARS-CoV-2/FLU/RSV plus assay is intended as an aid in the diagnosis of influenza from Nasopharyngeal swab specimens and should not be used as  a sole basis for treatment. Nasal washings and aspirates are unacceptable for Xpert Xpress SARS-CoV-2/FLU/RSV testing.  Fact Sheet for Patients: BloggerCourse.com  Fact Sheet for Healthcare Providers: SeriousBroker.it  This test is not yet approved or cleared by the [place] FDA and has been authorized for detection and/or diagnosis of SARS-CoV-2 by FDA under an Emergency Use Authorization (EUA). This EUA will remain in effect (meaning this test can be used) for the duration of the COVID-19 declaration under Section 564(b)(1) of the Act, 21 U.S.C. section 360bbb-3(b)(1), unless the authorization is terminated or revoked.  Performed at [facility], [address] [place], [place] [address]   TSH     Status: Abnormal   Collection Time: 10/15/20 12:36 PM  Result Value Ref Range   TSH 4.55 (H) 0.40 - 4.50 mIU/L  BASIC METABOLIC PANEL WITH GFR     Status: Abnormal   Collection Time: 10/15/20 12:36 PM  Result Value Ref Range   Glucose, Bld 78 65 - 99 mg/dL    Comment: .            Fasting reference interval .    BUN 10 7 - 25 mg/dL   Creat 0.98 (L) 1.19 - 0.93 mg/dL    Comment: For patients >67 years of age, the reference limit for Creatinine is approximately 13% higher for people identified as African-American. .    GFR, Est Non African American 92 > OR = 60 mL/min/1.2m2   GFR, Est African American 107 > OR = 60  mL/min/1.67m2   BUN/Creatinine Ratio 18 6 - 22 (calc)   Sodium 134 (L) 135 - 146 mmol/L   Potassium 4.5 3.5 - 5.3 mmol/L   Chloride 96 (L) 98 - 110 mmol/L   CO2 30 20 - 32 mmol/L   Calcium 9.3 8.6 - 10.4 mg/dL  CBC with Differential/Platelet     Status: Abnormal   Collection Time: 10/15/20 12:36 PM  Result Value Ref Range   WBC 2.6 (L) 3.8 - 10.8 Thousand/uL   RBC 3.63 (L) 3.80 - 5.10 Million/uL   Hemoglobin 11.3 (L) 11.7 - 15.5 g/dL   HCT 14.7 (L) 82.9 - 56.2 %   MCV 95.0 80.0 - 100.0 fL   MCH 31.1 27.0 - 33.0 pg   MCHC 32.8 32.0 - 36.0 g/dL   RDW 13.0 86.5 - 78.4 %   Platelets 164 140 - 400 Thousand/uL   MPV 12.4 7.5 - 12.5 fL   Neutro Abs 918 (L) 1,500 - 7,800 cells/uL   Lymphs Abs 1,269 850 - 3,900 cells/uL   Absolute Monocytes 351 200 - 950 cells/uL   Eosinophils Absolute 31 15 - 500 cells/uL   Basophils Absolute 31 0 - 200 cells/uL   Neutrophils Relative % 35.3 %   Total Lymphocyte 48.8 %   Monocytes Relative 13.5 %   Eosinophils Relative 1.2 %   Basophils Relative 1.2 %     PHQ2/9: Depression screen [facility] 2/9 11/20/2020 10/15/2020 08/30/2020 08/09/2020 04/27/2020  Decreased Interest 1 3 3 2 2   Down, Depressed, Hopeless 1 2 3 2 2   PHQ - 2 Score 2 5 6 4 4   Altered sleeping 0 0 0 0 2  Tired, decreased energy 0 1 1 1 2   Change in appetite 1 1 2 3 3   Feeling bad or failure about yourself  1 0 3 2 0  Trouble concentrating 0 2 0 2 1  Moving slowly or fidgety/restless 0 0 2 0 1  Suicidal thoughts 0 0  0 0 0  PHQ-9 Score 4 9 14 12 13   Difficult doing work/chores - - Very difficult Somewhat difficult Somewhat difficult  Some recent data might be hidden    phq 9 is positive   Fall Risk: Fall Risk  11/20/2020 10/15/2020 08/30/2020 08/09/2020 05/29/2020  Falls in the past year? 0 0 0 0 0  Number falls in past yr: 0 0 0 0 0  Injury with Fall? 0 0 0 0 0  Risk for fall due to : - - - No Fall Risks -  Risk for fall due to: Comment - - - - -  Follow up - - - - -      Functional Status Survey: Is the patient deaf or have difficulty hearing?: No Does the patient have difficulty seeing, even when wearing glasses/contacts?: No Does the patient have difficulty concentrating, remembering, or making decisions?: Yes Does the patient have difficulty walking or climbing stairs?: No Does the patient have difficulty dressing or bathing?: No Does the patient have difficulty doing errands alone such as visiting a doctor's office or shopping?: No   Assessment & Plan  1. Hypothyroidism (acquired)  - TSH  2. Hyponatremia  - Basic metabolic panel  3. Severe protein-energy malnutrition (HCC)  Speech therapy has helped   4. Vitamin D deficiency  - Vitamin D, Ergocalciferol, (DRISDOL) 1.25 MG (50000 UNIT) CAPS capsule; Take 1 capsule (50,000 Units total) by mouth every 7 (seven) days.  Dispense: 12 capsule; Refill: 0  5. Atherosclerosis of aorta (HCC)  Palliative care off statin therapy   6. Paroxysmal atrial fibrillation (HCC)  Rate controlled  7. Scoliosis of thoracic spine, unspecified scoliosis type   8. Esophageal dysphagia  Improved  9. Mild major depression (HCC)  Doing better now, phq 9 down to 4  10. Leukopenia, unspecified type  - CBC with Differential/Platelet

## 2020-11-20 ENCOUNTER — Encounter: Payer: Self-pay | Admitting: Family Medicine

## 2020-11-20 ENCOUNTER — Other Ambulatory Visit: Payer: Self-pay

## 2020-11-20 ENCOUNTER — Ambulatory Visit (INDEPENDENT_AMBULATORY_CARE_PROVIDER_SITE_OTHER): Payer: Medicare HMO | Admitting: Family Medicine

## 2020-11-20 VITALS — BP 112/70 | HR 67 | Temp 97.5°F | Resp 16 | Ht 61.0 in | Wt 84.9 lb

## 2020-11-20 DIAGNOSIS — R1319 Other dysphagia: Secondary | ICD-10-CM | POA: Diagnosis not present

## 2020-11-20 DIAGNOSIS — E039 Hypothyroidism, unspecified: Secondary | ICD-10-CM | POA: Diagnosis not present

## 2020-11-20 DIAGNOSIS — F320 Major depressive disorder, single episode, mild: Secondary | ICD-10-CM | POA: Diagnosis not present

## 2020-11-20 DIAGNOSIS — E871 Hypo-osmolality and hyponatremia: Secondary | ICD-10-CM | POA: Diagnosis not present

## 2020-11-20 DIAGNOSIS — E43 Unspecified severe protein-calorie malnutrition: Secondary | ICD-10-CM

## 2020-11-20 DIAGNOSIS — M419 Scoliosis, unspecified: Secondary | ICD-10-CM

## 2020-11-20 DIAGNOSIS — I700 Atherosclerosis of aorta: Secondary | ICD-10-CM | POA: Diagnosis not present

## 2020-11-20 DIAGNOSIS — E559 Vitamin D deficiency, unspecified: Secondary | ICD-10-CM

## 2020-11-20 DIAGNOSIS — D72819 Decreased white blood cell count, unspecified: Secondary | ICD-10-CM | POA: Diagnosis not present

## 2020-11-20 DIAGNOSIS — I480 Paroxysmal atrial fibrillation: Secondary | ICD-10-CM | POA: Diagnosis not present

## 2020-11-20 MED ORDER — VITAMIN D (ERGOCALCIFEROL) 1.25 MG (50000 UNIT) PO CAPS: 50000.0000 [IU] | ORAL_CAPSULE | ORAL | 0 refills | Status: DC

## 2020-11-21 ENCOUNTER — Telehealth: Payer: Self-pay | Admitting: Family Medicine

## 2020-11-21 DIAGNOSIS — I129 Hypertensive chronic kidney disease with stage 1 through stage 4 chronic kidney disease, or unspecified chronic kidney disease: Secondary | ICD-10-CM | POA: Diagnosis not present

## 2020-11-21 DIAGNOSIS — I480 Paroxysmal atrial fibrillation: Secondary | ICD-10-CM | POA: Diagnosis not present

## 2020-11-21 DIAGNOSIS — E039 Hypothyroidism, unspecified: Secondary | ICD-10-CM | POA: Diagnosis not present

## 2020-11-21 DIAGNOSIS — F330 Major depressive disorder, recurrent, mild: Secondary | ICD-10-CM | POA: Diagnosis not present

## 2020-11-21 DIAGNOSIS — E440 Moderate protein-calorie malnutrition: Secondary | ICD-10-CM | POA: Diagnosis not present

## 2020-11-21 DIAGNOSIS — K222 Esophageal obstruction: Secondary | ICD-10-CM | POA: Diagnosis not present

## 2020-11-21 DIAGNOSIS — N189 Chronic kidney disease, unspecified: Secondary | ICD-10-CM | POA: Diagnosis not present

## 2020-11-21 DIAGNOSIS — E871 Hypo-osmolality and hyponatremia: Secondary | ICD-10-CM | POA: Diagnosis not present

## 2020-11-21 DIAGNOSIS — R1314 Dysphagia, pharyngoesophageal phase: Secondary | ICD-10-CM | POA: Diagnosis not present

## 2020-11-21 LAB — CBC WITH DIFFERENTIAL/PLATELET
Absolute Monocytes: 289 cells/uL (ref 200–950)
Basophils Absolute: 30 cells/uL (ref 0–200)
Basophils Relative: 1.1 %
Eosinophils Absolute: 30 cells/uL (ref 15–500)
Eosinophils Relative: 1.1 %
HCT: 34.4 % — ABNORMAL LOW (ref 35.0–45.0)
Hemoglobin: 11.9 g/dL (ref 11.7–15.5)
Lymphs Abs: 1291 cells/uL (ref 850–3900)
MCH: 32.2 pg (ref 27.0–33.0)
MCHC: 34.6 g/dL (ref 32.0–36.0)
MCV: 93.2 fL (ref 80.0–100.0)
MPV: 11.8 fL (ref 7.5–12.5)
Monocytes Relative: 10.7 %
Neutro Abs: 1061 cells/uL — ABNORMAL LOW (ref 1500–7800)
Neutrophils Relative %: 39.3 %
Platelets: 141 10*3/uL (ref 140–400)
RBC: 3.69 10*6/uL — ABNORMAL LOW (ref 3.80–5.10)
RDW: 12.3 % (ref 11.0–15.0)
Total Lymphocyte: 47.8 %
WBC: 2.7 10*3/uL — ABNORMAL LOW (ref 3.8–10.8)

## 2020-11-21 LAB — BASIC METABOLIC PANEL
BUN/Creatinine Ratio: 14 (calc) (ref 6–22)
BUN: 8 mg/dL (ref 7–25)
CO2: 30 mmol/L (ref 20–32)
Calcium: 9.4 mg/dL (ref 8.6–10.4)
Chloride: 97 mmol/L — ABNORMAL LOW (ref 98–110)
Creat: 0.59 mg/dL — ABNORMAL LOW (ref 0.60–0.93)
Glucose, Bld: 73 mg/dL (ref 65–99)
Potassium: 4.6 mmol/L (ref 3.5–5.3)
Sodium: 136 mmol/L (ref 135–146)

## 2020-11-21 LAB — TSH: TSH: 21.84 mIU/L — ABNORMAL HIGH (ref 0.40–4.50)

## 2020-11-21 NOTE — Telephone Encounter (Signed)
Called to recommend an order for a barium swallow study.  Please advise and call to confirm at [phone]

## 2020-11-23 NOTE — Telephone Encounter (Signed)
They would like to repeat it. Okay to give orders?

## 2020-11-27 ENCOUNTER — Other Ambulatory Visit: Payer: Self-pay | Admitting: Family Medicine

## 2020-11-27 DIAGNOSIS — R1312 Dysphagia, oropharyngeal phase: Secondary | ICD-10-CM

## 2020-11-27 DIAGNOSIS — E43 Unspecified severe protein-calorie malnutrition: Secondary | ICD-10-CM

## 2020-11-27 NOTE — Telephone Encounter (Signed)
Pt son called asking about the GI study and if it had been ordered yet.  He would like a call when it has been ordered.  CB#  [phone]

## 2020-11-29 DIAGNOSIS — F330 Major depressive disorder, recurrent, mild: Secondary | ICD-10-CM | POA: Diagnosis not present

## 2020-11-29 DIAGNOSIS — I129 Hypertensive chronic kidney disease with stage 1 through stage 4 chronic kidney disease, or unspecified chronic kidney disease: Secondary | ICD-10-CM | POA: Diagnosis not present

## 2020-11-29 DIAGNOSIS — K222 Esophageal obstruction: Secondary | ICD-10-CM | POA: Diagnosis not present

## 2020-11-29 DIAGNOSIS — N189 Chronic kidney disease, unspecified: Secondary | ICD-10-CM | POA: Diagnosis not present

## 2020-11-29 DIAGNOSIS — E440 Moderate protein-calorie malnutrition: Secondary | ICD-10-CM | POA: Diagnosis not present

## 2020-11-29 DIAGNOSIS — R1314 Dysphagia, pharyngoesophageal phase: Secondary | ICD-10-CM | POA: Diagnosis not present

## 2020-11-29 DIAGNOSIS — E871 Hypo-osmolality and hyponatremia: Secondary | ICD-10-CM | POA: Diagnosis not present

## 2020-11-29 DIAGNOSIS — I480 Paroxysmal atrial fibrillation: Secondary | ICD-10-CM | POA: Diagnosis not present

## 2020-11-29 DIAGNOSIS — E039 Hypothyroidism, unspecified: Secondary | ICD-10-CM | POA: Diagnosis not present

## 2021-01-21 ENCOUNTER — Inpatient Hospital Stay: Payer: Medicare HMO | Admitting: Oncology

## 2021-01-21 ENCOUNTER — Inpatient Hospital Stay: Payer: Medicare HMO

## 2021-01-21 NOTE — Progress Notes (Signed)
This encounter was created in error - please disregard.

## 2021-01-25 ENCOUNTER — Inpatient Hospital Stay: Payer: Medicare HMO | Attending: Oncology

## 2021-01-25 ENCOUNTER — Encounter: Payer: Self-pay | Admitting: Oncology

## 2021-01-25 ENCOUNTER — Inpatient Hospital Stay (HOSPITAL_BASED_OUTPATIENT_CLINIC_OR_DEPARTMENT_OTHER): Payer: Medicare HMO | Admitting: Oncology

## 2021-01-25 VITALS — BP 135/87 | HR 75 | Temp 97.9°F | Resp 20 | Wt 86.0 lb

## 2021-01-25 DIAGNOSIS — R1310 Dysphagia, unspecified: Secondary | ICD-10-CM | POA: Diagnosis not present

## 2021-01-25 DIAGNOSIS — R630 Anorexia: Secondary | ICD-10-CM | POA: Insufficient documentation

## 2021-01-25 DIAGNOSIS — Z803 Family history of malignant neoplasm of breast: Secondary | ICD-10-CM | POA: Insufficient documentation

## 2021-01-25 DIAGNOSIS — D709 Neutropenia, unspecified: Secondary | ICD-10-CM | POA: Insufficient documentation

## 2021-01-25 DIAGNOSIS — K219 Gastro-esophageal reflux disease without esophagitis: Secondary | ICD-10-CM | POA: Diagnosis not present

## 2021-01-25 DIAGNOSIS — I10 Essential (primary) hypertension: Secondary | ICD-10-CM | POA: Insufficient documentation

## 2021-01-25 DIAGNOSIS — R634 Abnormal weight loss: Secondary | ICD-10-CM | POA: Diagnosis not present

## 2021-01-25 DIAGNOSIS — E119 Type 2 diabetes mellitus without complications: Secondary | ICD-10-CM | POA: Insufficient documentation

## 2021-01-25 DIAGNOSIS — R079 Chest pain, unspecified: Secondary | ICD-10-CM | POA: Diagnosis not present

## 2021-01-25 DIAGNOSIS — E785 Hyperlipidemia, unspecified: Secondary | ICD-10-CM | POA: Insufficient documentation

## 2021-01-25 DIAGNOSIS — Z79899 Other long term (current) drug therapy: Secondary | ICD-10-CM | POA: Insufficient documentation

## 2021-01-25 LAB — CBC WITH DIFFERENTIAL/PLATELET
Abs Immature Granulocytes: 0.01 10*3/uL (ref 0.00–0.07)
Basophils Absolute: 0 10*3/uL (ref 0.0–0.1)
Basophils Relative: 1 %
Eosinophils Absolute: 0 10*3/uL (ref 0.0–0.5)
Eosinophils Relative: 1 %
HCT: 33.6 % — ABNORMAL LOW (ref 36.0–46.0)
Hemoglobin: 11.4 g/dL — ABNORMAL LOW (ref 12.0–15.0)
Immature Granulocytes: 0 %
Lymphocytes Relative: 50 %
Lymphs Abs: 1.5 10*3/uL (ref 0.7–4.0)
MCH: 30.7 pg (ref 26.0–34.0)
MCHC: 33.9 g/dL (ref 30.0–36.0)
MCV: 90.6 fL (ref 80.0–100.0)
Monocytes Absolute: 0.3 10*3/uL (ref 0.1–1.0)
Monocytes Relative: 11 %
Neutro Abs: 1.1 10*3/uL — ABNORMAL LOW (ref 1.7–7.7)
Neutrophils Relative %: 37 %
Platelets: 157 10*3/uL (ref 150–400)
RBC: 3.71 MIL/uL — ABNORMAL LOW (ref 3.87–5.11)
RDW: 15.6 % — ABNORMAL HIGH (ref 11.5–15.5)
WBC: 3 10*3/uL — ABNORMAL LOW (ref 4.0–10.5)
nRBC: 0 % (ref 0.0–0.2)

## 2021-01-25 NOTE — Progress Notes (Signed)
[facility]  [phone] [phone]  ID: Maureen [name] OB: [birth year]  MR#: [id number]  RFF#:[id number]  Patient Care Team: Alba Cory, MD as PCP - General (Family Medicine) Isla Pence, OD as Consulting Physician (Optometry) Dew, Marlow Baars, MD as Consulting Physician (Vascular Surgery) Jeralyn Ruths, MD as Consulting Physician (Oncology) Nadara Mustard, MD as Referring Physician (Obstetrics and Gynecology) Neysa Hotter, MD as Consulting Physician (Psychiatry) Wenda Overland, LCSW as Social Worker   CHIEF COMPLAINT: Neutropenia.  INTERVAL HISTORY: Maureen [name] is a 75 year old female who was last seen in clinic on 07/17/2020.   She was hospitalized from 09/11/2020-09/19/2020 for failure to thrive due to dysphagia secondary to esophageal dysmotility.  She suffered from several electrolyte abnormalities which were replaced.  She ultimately was discharged to a SNF.  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  Empiric dilatation was attempted using balloon up to 15 mm in size.   She continues to have dysphagia and weight loss.  A esophageal manometry was ordered but has not yet been completed.  Patient continues to have poor caloric intake but has been able to maintain her weight.  She is only eating soft foods such as mashed potatoes, tomato soup and ice cream.  She drinks 1 or 2 boost daily.  Her son has been trying to get more protein in her and brings her over rotessterie chicken weekly.  She lives alone.  She is able to perform all of her ADLs.  Denies any falls.    REVIEW OF SYSTEMS:   Review of Systems  Constitutional: Negative.  Negative for fever, malaise/fatigue and weight loss.  Respiratory: Negative.  Negative for cough, hemoptysis and shortness of breath.   Cardiovascular: Negative.  Negative for chest pain and leg  swelling.  Gastrointestinal: Negative.  Negative for abdominal pain, nausea and vomiting.  Genitourinary: Negative.  Negative for dysuria.  Musculoskeletal: Negative.  Negative for back pain.  Skin: Negative.  Negative for rash.  Neurological: Negative.  Negative for dizziness, focal weakness, weakness and headaches.  Psychiatric/Behavioral: Negative.  The patient is not nervous/anxious.     As per HPI. Otherwise, a complete review of systems is negative.  PAST MEDICAL HISTORY: Past Medical History:  Diagnosis Date  . Allergy   . Corn of toe   . Depression   . First degree AV block   . GERD (gastroesophageal reflux disease)   . Hammer toe of right foot   . Hyperglycemia   . Hyperlipidemia   . Hypertension   . Loss of appetite   . Prolapse of urethra   . Scoliosis (and kyphoscoliosis), idiopathic   . Thyroid disease   . Tinnitus of both ears   . Vitreous floaters of right eye     PAST SURGICAL HISTORY: Past Surgical History:  Procedure Laterality Date  . ABDOMINAL HYSTERECTOMY  1989  . APPENDECTOMY  1989  . BREAST EXCISIONAL BIOPSY Bilateral    multiple biopsies negative  . BREAST SURGERY     Several  . COLONOSCOPY WITH PROPOFOL N/A 07/28/2019   Procedure: COLONOSCOPY WITH PROPOFOL;  Surgeon: Toney Reil, MD;  Location: [facility] ENDOSCOPY;  Service: Gastroenterology;  Laterality: N/A;  . ESOPHAGOGASTRODUODENOSCOPY (EGD) WITH PROPOFOL N/A 11/11/2019   Procedure: ESOPHAGOGASTRODUODENOSCOPY (EGD) WITH PROPOFOL;  Surgeon: Toney Reil, MD;  Location: [facility] ENDOSCOPY;  Service: Gastroenterology;  Laterality: N/A;  . TUMOR EXCISION  1989   same time as hysterectomy    FAMILY HISTORY: Family History  Problem Relation Age of Onset  . Diabetes Father 51       DM complications  . Hypertension Mother 57       CKD  . Thyroid disease Mother   . Heart failure Mother   . Kidney disease Mother   . Depression Mother   . Breast cancer Sister 69  . Depression Sister    . Kidney disease Other   . Breast cancer Maternal Aunt   . Breast cancer Cousin   . Anuerysm Son 51  . Diabetes Son     ADVANCED DIRECTIVES (Y/N):  N  HEALTH MAINTENANCE: Social History   Tobacco Use  . Smoking status: Never Smoker  . Smokeless tobacco: Never Used  . Tobacco comment: smoking cessation materials not required  Vaping Use  . Vaping Use: Never used  Substance Use Topics  . Alcohol use: Never    Alcohol/week: 0.0 standard drinks  . Drug use: No     Colonoscopy:  PAP:  Bone density:  Lipid panel:  Allergies  Allergen Reactions  . Aspirin     Tinnitus  . Citalopram Other (See Comments)  . Codeine Nausea And Vomiting  . Penicillins Rash    Has patient had a PCN reaction causing immediate rash, facial/tongue/throat swelling, SOB or lightheadedness with hypotension: Yes Has patient had a PCN reaction causing severe rash involving mucus membranes or skin necrosis: No Has patient had a PCN reaction that required hospitalization No Has patient had a PCN reaction occurring within the last 10 years: No If all of the above answers are "NO", then may proceed with Cephalosporin use.    Current Outpatient Medications  Medication Sig Dispense Refill  . Ensure (ENSURE) Take 237 mLs by mouth. Pt drinking approx 3 ensures per day    . Iron-Vitamins (GERITOL COMPLETE PO) Take 1 tablet by mouth daily. (Patient not taking: No sig reported)    . levothyroxine (SYNTHROID) 88 MCG tablet Take 1 tablet (88 mcg total) by mouth daily before breakfast. 90 tablet 0  . omeprazole (PRILOSEC) 40 MG capsule Take 1 capsule (40 mg total) by mouth daily before breakfast. 90 capsule 3  . sodium chloride 1 g tablet Take 1 tablet (1 g total) by mouth 2 (two) times daily with a meal. (Patient not taking: Reported on 11/20/2020)    . Vitamin D, Ergocalciferol, (DRISDOL) 1.25 MG (50000 UNIT) CAPS capsule Take 1 capsule (50,000 Units total) by mouth every 7 (seven) days. 12 capsule 0   No  current facility-administered medications for this visit.    OBJECTIVE: There were no vitals filed for this visit.   There is no height or weight on file to calculate BMI.    ECOG FS:1 - Symptomatic but completely ambulatory  Physical Exam Constitutional:      Appearance: Normal appearance. She is underweight.  HENT:     Head: Normocephalic and atraumatic.  Eyes:     Pupils: Pupils are equal, round, and reactive to light.  Cardiovascular:     Rate and Rhythm: Normal rate and regular rhythm.     Heart sounds: Normal heart sounds. No murmur heard.   Pulmonary:     Effort: Pulmonary effort is normal.     Breath sounds: Normal breath sounds. No wheezing.  Abdominal:     General: Bowel sounds are normal. There is no distension.     Palpations: Abdomen is soft.  Tenderness: There is no abdominal tenderness.  Musculoskeletal:        General: Normal range of motion.     Cervical back: Normal range of motion.  Skin:    General: Skin is warm and dry.     Findings: No rash.  Neurological:     Mental Status: She is alert and oriented to person, place, and time.  Psychiatric:        Judgment: Judgment normal.       LAB RESULTS:  Lab Results  Component Value Date   NA 136 11/20/2020   K 4.6 11/20/2020   CL 97 (L) 11/20/2020   CO2 30 11/20/2020   GLUCOSE 73 11/20/2020   BUN 8 11/20/2020   CREATININE 0.59 (L) 11/20/2020   CALCIUM 9.4 11/20/2020   PROT 7.3 09/11/2020   ALBUMIN 3.8 09/11/2020   AST 41 09/11/2020   ALT 31 09/11/2020   ALKPHOS 69 09/11/2020   BILITOT 0.7 09/11/2020   GFRNONAA 92 10/15/2020   GFRAA 107 10/15/2020    Lab Results  Component Value Date   WBC 2.7 (L) 11/20/2020   NEUTROABS 1,061 (L) 11/20/2020   HGB 11.9 11/20/2020   HCT 34.4 (L) 11/20/2020   MCV 93.2 11/20/2020   PLT 141 11/20/2020     STUDIES: No results found.  ASSESSMENT: Neutropenia  PLAN:    1. Neutropenia:  -Unclear etiology. -White count has remained stable with  baseline from 2.6-normal.  ANC around 1. -Likely secondary to lack of nutrition given worsening of her numbers since her appetite and weight have declined. -Work-up included folate, B12, iron stores, LDH, neutrophil antibodies and peripheral flow cytometry which were all negative. -Intelligent myeloid panel expected to be drawn today.  -Labs from 01/25/21 show a WBC of 3.0 and ANC 1.1.  -Denies any recent infections. -No intervention is needed at this time.  2.  Weight loss:  -Secondary to anorexia and dysphagia. -Weight is stable today at 86 pounds. -Encouraged high-fat high-protein soft foods. -Patient would benefit from a dietitian consult. -CT chest from 09/11/2020 showed complete collapse of the right lower lobe with occlusion of the right lower lobar pulmonary bronchus suggesting and endobronchial mass such as aspiration debris or mucous plug.  Correlate with bronchoscopy.  GERD.  Disposition: -Referral to cancer center dietitian. -RTC in 6 months with repeat labs and MD assessment.  Greater than 50% was spent in counseling and coordination of care with this patient including but not limited to discussion of the relevant topics above (See A&P) including, but not limited to diagnosis and management of acute and chronic medical conditions.   Patient expressed understanding and was in agreement with this plan. She also understands that She can call clinic at any time with any questions, concerns, or complaints.    Mauro Kaufmann, NP   01/25/2021 8:48 AM

## 2021-01-25 NOTE — Progress Notes (Signed)
Patient here today for follow up regarding neutropenia. Patient and family concerned about weight loss and appetite.

## 2021-02-07 LAB — INTELLIGEN MYELOID

## 2021-02-19 ENCOUNTER — Other Ambulatory Visit: Payer: Self-pay | Admitting: Family Medicine

## 2021-02-19 ENCOUNTER — Inpatient Hospital Stay: Payer: Medicare HMO

## 2021-02-19 DIAGNOSIS — E43 Unspecified severe protein-calorie malnutrition: Secondary | ICD-10-CM

## 2021-02-19 DIAGNOSIS — R1312 Dysphagia, oropharyngeal phase: Secondary | ICD-10-CM

## 2021-02-19 NOTE — Progress Notes (Signed)
Nutrition  Patient was on RD's schedule for phone visit for today.  Called highlighted number and chart (son).  He is currently out of town at a funeral.  Asked that RD call patient's home number.  Son reports that patient is eating applesauce, mashed potatoes, chicken, ice cream, pot pies and boost/ensure shakes.  Son says that he knows she needs to eat more.    RD called home number and no answer and voicemail box is full so unable to leave voice mail.    RD called son back and let him know that was unable to reach patient.  Son said that he would get in touch with patient and have her return RD's call.  Confirmed that son had RD's office number. Will await return call from patient and/or son when he is back in town.   Mathias Bogacki B. Freida Busman, RD, LDN Registered Dietitian [phone] (mobile)

## 2021-03-19 NOTE — Progress Notes (Signed)
Name: Maureen [name]   MRN: [id number]    DOB: 08-08-1946   Date:03/20/2021       Progress Note  Subjective  Chief Complaint  Follow up   HPI   Malnutrition: BMI was down to 15.72 , she has lost 43 lbs since 07/2019 ( one year )  She had seen hematologist , gastroenterologist, psychiatrist. She continue to refuse to eat and sodium dropped and also white count, we sent patient to [facility] for possible admission on 09/14/2020. She was admitted and offered to have NG tube however she refused, she had multiple labs done , given medication to help her bp she was given salt tablets. She was sent from [facility] to a long term care facility/rehab on 09/19/2020 and was supposed to stay for 20 days, however her insurance only allowed 10 days so she has been home since 09/29/2020. Since last visit with me January 2022 weight is up 2 lbs. She is eating chicken pot pie, rotisserie chicken, mash potatoes, apple sauce, and two ensures per day, but has not been going to 4 per day as recommended. Son feels like having ST was helpful and we will refer to home health again   MDD: she was seen by Dr. Susette Racer - psychiatrist , last visit 05/2020 and has been released per patient/son's request, she does not want to take Remeron - afraid of polypharmacy. She was on clonazepam but has been off for months and has not noticed any difference in symptoms   Hypothyroidism: she has been taking medication daily now, 88 mcg daily , during hospital stay her TSH was elevated, last visit TSH was elevated, she states she has been compliant lately and we will recheck levels. No change in bowel movements, denies dry skin   Paroxysmal Afib: she never started Eliquis, she states cardiologist called her for a visit but she did not schedule it. She denies chest pain or palpitation, she is afraid of taking medications.Unchanged  Atherosclerosis of aorta: found on CT chest , patient is not interested on statin therapy, palliative care. Same    Pancytopenia: she has been seeing Dr. Grayland Ormond, showed pancytopenia however last levels showed normal platelets, low wbc and anemia improved , but still has low white count but stable    Patient Active Problem List   Diagnosis Date Noted  . Hyponatremia 09/24/2020  . Protein-calorie malnutrition, severe 09/13/2020  . Esophageal motility disorder   . Palliative care by specialist   . Advanced care planning/counseling discussion   . Failure to thrive in adult 09/11/2020  . Avoidant-restrictive food intake disorder (ARFID) 08/30/2020  . Paroxysmal atrial fibrillation ([place]) 03/22/2020  . Vitamin D deficiency 03/01/2020  . Dysphagia   . Arrhythmia 08/25/2019  . Rectal bleeding   . Low grade squamous intraepith lesion on cytologic smear cervix (lgsil) 08/04/2018  . Chronic venous insufficiency 07/05/2018  . Lymphedema 07/05/2018  . Hypertension 04/02/2018  . Swelling of limb 04/02/2018  . Depression, major, recurrent, mild ([place]) 12/29/2017  . Leukopenia 05/24/2017  . Allergic rhinitis, seasonal 06/29/2015  . Clavus 06/29/2015  . 1st degree AV block 06/29/2015  . Hammer toe 06/29/2015  . H/O: HTN (hypertension) 06/29/2015  . Blood glucose elevated 06/29/2015  . Floater, vitreous 06/29/2015  . Bilateral tinnitus 06/29/2015  . Hypothyroidism (acquired) 04/02/2015  . Gastroesophageal reflux disease 04/02/2015  . Hammer toe of right foot 04/02/2015  . Scoliosis of thoracic spine 04/02/2015  . Urethral prolapse 04/02/2015  . Corn of toe 04/02/2015  . Hyperlipidemia  04/02/2015  . Varicose veins of both lower extremities 04/02/2015    Past Surgical History:  Procedure Laterality Date  . ABDOMINAL HYSTERECTOMY  1989  . APPENDECTOMY  1989  . BREAST EXCISIONAL BIOPSY Bilateral    multiple biopsies negative  . BREAST SURGERY     Several  . COLONOSCOPY WITH PROPOFOL N/A 07/28/2019   Procedure: COLONOSCOPY WITH PROPOFOL;  Surgeon: Lin Landsman, MD;  Location: [facility] ENDOSCOPY;   Service: Gastroenterology;  Laterality: N/A;  . ESOPHAGOGASTRODUODENOSCOPY (EGD) WITH PROPOFOL N/A 11/11/2019   Procedure: ESOPHAGOGASTRODUODENOSCOPY (EGD) WITH PROPOFOL;  Surgeon: Lin Landsman, MD;  Location: [facility] ENDOSCOPY;  Service: Gastroenterology;  Laterality: N/A;  . TUMOR EXCISION  1989   same time as hysterectomy    Family History  Problem Relation Age of Onset  . Diabetes Father 52       DM complications  . Hypertension Mother 13       CKD  . Thyroid disease Mother   . Heart failure Mother   . Kidney disease Mother   . Depression Mother   . Breast cancer Sister 93  . Depression Sister   . Kidney disease Other   . Breast cancer Maternal Aunt   . Breast cancer Cousin   . Anuerysm Son 47  . Diabetes Son     Social History   Tobacco Use  . Smoking status: Never Smoker  . Smokeless tobacco: Never Used  . Tobacco comment: smoking cessation materials not required  Substance Use Topics  . Alcohol use: Never    Alcohol/week: 0.0 standard drinks     Current Outpatient Medications:  .  Ensure (ENSURE), Take 237 mLs by mouth. Pt drinking approx 3 ensures per day, Disp: , Rfl:  .  levothyroxine (SYNTHROID) 88 MCG tablet, Take 1 tablet (88 mcg total) by mouth daily before breakfast., Disp: 90 tablet, Rfl: 0 .  omeprazole (PRILOSEC) 40 MG capsule, Take 1 capsule (40 mg total) by mouth daily before breakfast., Disp: 90 capsule, Rfl: 3 .  Iron-Vitamins (GERITOL COMPLETE PO), Take 1 tablet by mouth daily. (Patient not taking: Reported on 03/20/2021), Disp: , Rfl:  .  sodium chloride 1 g tablet, Take 1 tablet (1 g total) by mouth 2 (two) times daily with a meal. (Patient not taking: No sig reported), Disp: , Rfl:  .  Vitamin D, Ergocalciferol, (DRISDOL) 1.25 MG (50000 UNIT) CAPS capsule, Take 1 capsule (50,000 Units total) by mouth every 7 (seven) days. (Patient not taking: No sig reported), Disp: 12 capsule, Rfl: 0  Allergies  Allergen Reactions  . Aspirin     Tinnitus   . Citalopram Other (See Comments)  . Codeine Nausea And Vomiting  . Penicillins Rash    Has patient had a PCN reaction causing immediate rash, facial/tongue/throat swelling, SOB or lightheadedness with hypotension: Yes Has patient had a PCN reaction causing severe rash involving mucus membranes or skin necrosis: No Has patient had a PCN reaction that required hospitalization No Has patient had a PCN reaction occurring within the last 10 years: No If all of the above answers are "NO", then may proceed with Cephalosporin use.    I personally reviewed active problem list, medication list, allergies, family history, social history, health maintenance with the patient/caregiver today.   ROS  Constitutional: Negative for fever or weight change.  Respiratory: Negative for cough and shortness of breath.   Cardiovascular: Negative for chest pain or palpitations.  Gastrointestinal: Negative for abdominal pain, no bowel changes.  Musculoskeletal: Negative for  gait problem or joint swelling.  Skin: Negative for rash.  Neurological: Negative for dizziness or headache.  No other specific complaints in a complete review of systems (except as listed in HPI above).  Objective  Vitals:   03/20/21 1104  BP: 122/78  Pulse: 72  Resp: 14  Temp: 97.9 F (36.6 C)  TempSrc: Oral  SpO2: 96%  Weight: 86 lb 6.4 oz (39.2 kg)  Height: 5' 1"  (1.549 m)    Body mass index is 16.33 kg/m.  Physical Exam  Constitutional: Patient appears well-developed and malnourished  No distress.  HEENT: head atraumatic, normocephalic, pupils equal and reactive to light, neck supple Cardiovascular: Normal rate, regular rhythm and normal heart sounds.  2/6  murmur heard. No BLE edema. Pulmonary/Chest: Effort normal and breath sounds normal. No respiratory distress. Abdominal: Soft.  There is no tenderness. Psychiatric: Patient has a normal mood and affect. behavior is normal. Judgment and thought content  normal.  Recent Results (from the past 2160 hour(s))  CBC with Differential/Platelet     Status: Abnormal   Collection Time: 01/25/21  1:12 PM  Result Value Ref Range   WBC 3.0 (L) 4.0 - 10.5 K/uL   RBC 3.71 (L) 3.87 - 5.11 MIL/uL   Hemoglobin 11.4 (L) 12.0 - 15.0 g/dL   HCT 33.6 (L) 36.0 - 46.0 %   MCV 90.6 80.0 - 100.0 fL   MCH 30.7 26.0 - 34.0 pg   MCHC 33.9 30.0 - 36.0 g/dL   RDW 15.6 (H) 11.5 - 15.5 %   Platelets 157 150 - 400 K/uL   nRBC 0.0 0.0 - 0.2 %   Neutrophils Relative % 37 %   Neutro Abs 1.1 (L) 1.7 - 7.7 K/uL   Lymphocytes Relative 50 %   Lymphs Abs 1.5 0.7 - 4.0 K/uL   Monocytes Relative 11 %   Monocytes Absolute 0.3 0.1 - 1.0 K/uL   Eosinophils Relative 1 %   Eosinophils Absolute 0.0 0.0 - 0.5 K/uL   Basophils Relative 1 %   Basophils Absolute 0.0 0.0 - 0.1 K/uL   Immature Granulocytes 0 %   Abs Immature Granulocytes 0.01 0.00 - 0.07 K/uL    Comment: Performed at [facility], [address] [place], [place] [address]  Int[facility] Myeloid     Status: Abnormal   Collection Time: 01/25/21  1:14 PM  Result Value Ref Range   Specimen Type Whole Blood    Clinical Indication Comment     Comment: Clinical Indication: Neutropenia   RESULT SUMMARY See below: (A)     Comment: (NOTE) At least one variant of potential clinical significance (Tier II) was detected. Gene   Variant      Amino Acid   Variant       Clinical       Detected     Change       Frequency (%) Impact IDH1   c.315C>T     p.Gly105Gly  22            Tier II See additional details below Therapeutic Implications Gene   Amino   FDA Approved FDA Approved Possible    Clinica       Acid    Therapies    Therapies    Drug        l       Change               for Other    Resistance  Trials  Indications IDH1   p.Gly   None         None         None        None       105Gly    INTERPRETATION Comment     Comment: (NOTE) INTERPRETATION IDH1 c.315C>T (p.G105G) is located  in exon 4 of transcript [id number] for the gene IDH1 on chromosome 2. Other variants in IDH1 have been reported to lead to disease by causing a gain-of-function in the encoded protein. The current variant alters a non-conserved nucleotide resulting in a synonymous change. Based on sequencing data from [facility] and internal testing, this variant is likely a germline variant. Prognostic Significance: At least one small clinical study suggests that this variant is an unfavorable prognostic indicator for AML. In the absence of a diagnosis of AML, the clinical significance is unknown and requires clinical and morphologic correlation.   Based on these data, the variant is considered to be of possible clinical significance. The IDH1 (Isocitrate dehydrogenase 1; OMIM 147700) gene encodes an isoenzyme in the citric acid cycle that catalyzes conversion of isocitrate to alpha-ketoglutarate. Mutations in IDH1 reduce alpha- ketoglutarate to 2-hydroxyglutarate (2-HG). Accumulation of 2-HG results in histone and DNA hypermethylation and differentiation arrest of hematopoietic cells. IDH1 mutations are found in 4-10% of AML (mostly CN-AML), about 5% of MDS and rare MPN but with an increased frequency in blast-phase. Other malignancies associated with IDH1 mutations include gliomas, cholangiocarcinoma and chondrosarcoma.    METHODOLOGY Comment     Comment: (NOTE) IntelliGEN(R) Myeloid utilizes amplicon-based next generation sequencing to identify alterations in 50 genes that have diagnostic, prognostic, and therapeutic significance in myeloid neoplasms. The sensitivity of this assay is 5-10% variant allele fraction for single nucleotide variants (SNV) and insertion/deletions (InDels). Insertions and deletions of any length are detected when at least one breakpoint is contained within an amplicon. Insertions up to 126 bp and deletions up to 52 bp have been detected in clinical specimens. Mutations  outside the targeted regions and gene rearrangements will not be detected. Variants are categorized into Tiers based on their clinical impact, following a joint consensus recommendation from the AMP, ASCO, and CAP. Clinical and experimental evidence grouped into four levels (A-D) based on significance in clinical decision making (therapeutic, diagnosis, prognosis) is assigned to variants to determine their clinical significance . Tier I, Variants with Strong Clinical Significance (level A and B evidence); Tier II, Variants with Potential Clinical Significance (level C or D evidence); Tier III, Variants of Unknown Clinical Significance, and Tier IV, Benign or Likely Benign. Tier IV variants, if detected, are not reported. Results should be interpreted in conjunction with clinical and other laboratory findings for the most accurate interpretation. This test was developed and its performance characteristics determined by [facility]. It has not been cleared or approved by the Food and Drug Administration. LIST OF ALL GENES IN PANEL ABL1       CUX1       IKZF1      NPM1         SF3B1 ASXL1      DNMT3A     JAK2       NRAS         SMC1A BCOR       ETV6       JAK3       PDGFRA       SMC3 BCORL1     EZH2       KDM6A  PHF6         SRSF2 BRAF       FBXW7      KIT        PML          STAG2 CALR       FLT3       KMT2A      PTEN         TET2 CBL        GATA1      KRAS       PTPN11       TP53 CDKN2A      GATA2      MPL        RAD21        U2AF1 CEBPA      IDH1       NF1        RUNX1        WT1 CSF3R      IDH2       NOTCH1     SETBP1       ZRSR2    REFERENCES Comment     Comment: (NOTE) 1. COSMIC Database. Available at http://cancer.sanger.ac.uk/cosmic Accessed on Aug 05, 2016. 2. [employer] Reference. Available at RetroDivas.ch Accessed on Sep 27, 2015. 3. Li MM, et al. Standards and Guidelines for the Interpretation and Reporting of Sequence Variants in Cancer: A Joint  Consensus Recommendation of the Association for Molecular Pathology, American Society of Clinical Oncology, and College of American Pathologists. J Mol Diagn 2017; 19 (1): 4-23. 4. Papaemmanuil E, et al. Clinical and biological implications of driver mutations in myelodysplastic syndromes. Blood. 2013; 122 (22): 3616-27. 5. Papaemmanuil E, et al. Genomic Classification and Prognosis in Acute Myeloid Leukemia. Alison Stalling J Med. 2016; 374 (23): 2209-21. 6. Ludger Nutting, et al. Impact of IDH1 R132 mutations and an IDH1 single nucleotide polymorphism in cytogenetically normal acute myeloid leukemia: SNP [id number] is an adverse prognostic factor. J Clin Oncol. 2010;  28 (14): 2356-64.    DIRECTOR REVIEW Comment     Comment: (NOTE) Constance Goltz PhD, [facility] Performed At: [facility] [address] [place], [place] [id number] Katina Degree MDPhD YB:[id number]      PHQ2/9: Depression screen [facility] 2/9 03/20/2021 11/20/2020 10/15/2020 08/30/2020 08/09/2020  Decreased Interest 1 1 3 3 2   Down, Depressed, Hopeless 1 1 2 3 2   PHQ - 2 Score 2 2 5 6 4   Altered sleeping 0 0 0 0 0  Tired, decreased energy 3 0 1 1 1   Change in appetite 1 1 1 2 3   Feeling bad or failure about yourself  1 1 0 3 2  Trouble concentrating 0 0 2 0 2  Moving slowly or fidgety/restless 0 0 0 2 0  Suicidal thoughts 0 0 0 0 0  PHQ-9 Score 7 4 9 14 12   Difficult doing work/chores Somewhat difficult - - Very difficult Somewhat difficult  Some recent data might be hidden    phq 9 is positive   Fall Risk: Fall Risk  03/20/2021 11/20/2020 10/15/2020 08/30/2020 08/09/2020  Falls in the past year? 0 0 0 0 0  Number falls in past yr: 0 0 0 0 0  Injury with Fall? 0 0 0 0 0  Risk for fall due to : - - - - No Fall Risks  Risk for fall due to: Comment - - - - -  Follow up Falls prevention discussed - - - -  Functional Status Survey: Is the patient deaf or have difficulty hearing?: No Does the patient have difficulty seeing, even  when wearing glasses/contacts?: No Does the patient have difficulty concentrating, remembering, or making decisions?: Yes Does the patient have difficulty walking or climbing stairs?: No Does the patient have difficulty dressing or bathing?: No Does the patient have difficulty doing errands alone such as visiting a doctor's office or shopping?: No    Assessment & Plan  1. Vitamin D deficiency  - Vitamin D, Ergocalciferol, (DRISDOL) 1.25 MG (50000 UNIT) CAPS capsule; Take 1 capsule (50,000 Units total) by mouth every 7 (seven) days.  Dispense: 12 capsule; Refill: 0  2. Paroxysmal atrial fibrillation (HCC)   3. Atherosclerosis of aorta ([place])   4. Hypothyroidism (acquired)  - TSH  5. Oropharyngeal dysphagia  - Ambulatory referral to Home Health  6. Severe protein-energy malnutrition (HCC)  - COMPLETE METABOLIC PANEL WITH GFR - Ambulatory referral to Home Health  7. Gastroesophageal reflux disease with esophagitis without hemorrhage   8. Avoidant-restrictive food intake disorder (ARFID)  - COMPLETE METABOLIC PANEL WITH GFR  9. Breast cancer screening by mammogram  - MM Digital Screening; Future   10. Heart murmur  - ECHOCARDIOGRAM COMPLETE; Future

## 2021-03-20 ENCOUNTER — Encounter: Payer: Self-pay | Admitting: Family Medicine

## 2021-03-20 ENCOUNTER — Ambulatory Visit (INDEPENDENT_AMBULATORY_CARE_PROVIDER_SITE_OTHER): Payer: Medicare HMO | Admitting: Family Medicine

## 2021-03-20 ENCOUNTER — Other Ambulatory Visit: Payer: Self-pay

## 2021-03-20 VITALS — BP 122/78 | HR 72 | Temp 97.9°F | Resp 14 | Ht 61.0 in | Wt 86.4 lb

## 2021-03-20 DIAGNOSIS — Z1231 Encounter for screening mammogram for malignant neoplasm of breast: Secondary | ICD-10-CM | POA: Diagnosis not present

## 2021-03-20 DIAGNOSIS — E039 Hypothyroidism, unspecified: Secondary | ICD-10-CM

## 2021-03-20 DIAGNOSIS — R1312 Dysphagia, oropharyngeal phase: Secondary | ICD-10-CM

## 2021-03-20 DIAGNOSIS — K2100 Gastro-esophageal reflux disease with esophagitis, without bleeding: Secondary | ICD-10-CM | POA: Diagnosis not present

## 2021-03-20 DIAGNOSIS — E559 Vitamin D deficiency, unspecified: Secondary | ICD-10-CM

## 2021-03-20 DIAGNOSIS — E43 Unspecified severe protein-calorie malnutrition: Secondary | ICD-10-CM | POA: Diagnosis not present

## 2021-03-20 DIAGNOSIS — R011 Cardiac murmur, unspecified: Secondary | ICD-10-CM

## 2021-03-20 DIAGNOSIS — I700 Atherosclerosis of aorta: Secondary | ICD-10-CM

## 2021-03-20 DIAGNOSIS — F5082 Avoidant/restrictive food intake disorder: Secondary | ICD-10-CM

## 2021-03-20 DIAGNOSIS — I480 Paroxysmal atrial fibrillation: Secondary | ICD-10-CM | POA: Diagnosis not present

## 2021-03-20 MED ORDER — VITAMIN D (ERGOCALCIFEROL) 1.25 MG (50000 UNIT) PO CAPS: 50000.0000 [IU] | ORAL_CAPSULE | ORAL | 0 refills | Status: DC

## 2021-03-21 LAB — COMPLETE METABOLIC PANEL WITH GFR
AG Ratio: 1.2 (calc) (ref 1.0–2.5)
ALT: 14 U/L (ref 6–29)
AST: 24 U/L (ref 10–35)
Albumin: 4.2 g/dL (ref 3.6–5.1)
Alkaline phosphatase (APISO): 54 U/L (ref 37–153)
BUN: 8 mg/dL (ref 7–25)
CO2: 27 mmol/L (ref 20–32)
Calcium: 9.2 mg/dL (ref 8.6–10.4)
Chloride: 98 mmol/L (ref 98–110)
Creat: 0.63 mg/dL (ref 0.60–0.93)
GFR, Est African American: 102 mL/min/{1.73_m2} (ref >=60)
GFR, Est Non African American: 88 mL/min/{1.73_m2} (ref >=60)
Globulin: 3.5 g/dL (calc) (ref 1.9–3.7)
Glucose, Bld: 78 mg/dL (ref 65–99)
Potassium: 4.6 mmol/L (ref 3.5–5.3)
Sodium: 135 mmol/L (ref 135–146)
Total Bilirubin: 0.4 mg/dL (ref 0.2–1.2)
Total Protein: 7.7 g/dL (ref 6.1–8.1)

## 2021-03-21 LAB — TSH: TSH: 17.62 mIU/L — ABNORMAL HIGH (ref 0.40–4.50)

## 2021-03-22 ENCOUNTER — Other Ambulatory Visit: Payer: Self-pay | Admitting: Family Medicine

## 2021-03-22 DIAGNOSIS — E039 Hypothyroidism, unspecified: Secondary | ICD-10-CM

## 2021-03-22 MED ORDER — LEVOTHYROXINE SODIUM 100 MCG PO TABS: 100.0000 ug | ORAL_TABLET | Freq: Every day | ORAL | 0 refills | Status: DC

## 2021-05-13 ENCOUNTER — Other Ambulatory Visit: Payer: Self-pay | Admitting: Family Medicine

## 2021-05-13 ENCOUNTER — Telehealth: Payer: Self-pay | Admitting: Family Medicine

## 2021-05-13 DIAGNOSIS — I890 Lymphedema, not elsewhere classified: Secondary | ICD-10-CM | POA: Diagnosis not present

## 2021-05-13 DIAGNOSIS — I10 Essential (primary) hypertension: Secondary | ICD-10-CM | POA: Diagnosis not present

## 2021-05-13 DIAGNOSIS — I700 Atherosclerosis of aorta: Secondary | ICD-10-CM | POA: Diagnosis not present

## 2021-05-13 DIAGNOSIS — I440 Atrioventricular block, first degree: Secondary | ICD-10-CM | POA: Diagnosis not present

## 2021-05-13 DIAGNOSIS — D61818 Other pancytopenia: Secondary | ICD-10-CM | POA: Diagnosis not present

## 2021-05-13 DIAGNOSIS — R1319 Other dysphagia: Secondary | ICD-10-CM

## 2021-05-13 DIAGNOSIS — F331 Major depressive disorder, recurrent, moderate: Secondary | ICD-10-CM

## 2021-05-13 DIAGNOSIS — F330 Major depressive disorder, recurrent, mild: Secondary | ICD-10-CM | POA: Diagnosis not present

## 2021-05-13 DIAGNOSIS — I480 Paroxysmal atrial fibrillation: Secondary | ICD-10-CM | POA: Diagnosis not present

## 2021-05-13 DIAGNOSIS — I8393 Asymptomatic varicose veins of bilateral lower extremities: Secondary | ICD-10-CM | POA: Diagnosis not present

## 2021-05-13 DIAGNOSIS — K2100 Gastro-esophageal reflux disease with esophagitis, without bleeding: Secondary | ICD-10-CM | POA: Diagnosis not present

## 2021-05-13 NOTE — Telephone Encounter (Unsigned)
Copied from CRM [id number]. Topic: Quick Communication - Home Health Verbal Orders >> May 13, 2021  1:12 PM Marylen Ponto wrote: Caller/Agency: Tonya with [facility] Callback Number: [phone] Requesting OT/PT/Skilled Nursing/Social Work/Speech Therapy: nursing  Frequency: 1 week 3,  1 every  2 weeks 6, and 1 prn

## 2021-05-14 NOTE — Telephone Encounter (Signed)
Verbal orders given  

## 2021-05-15 ENCOUNTER — Ambulatory Visit: Payer: Self-pay | Admitting: *Deleted

## 2021-05-15 NOTE — Telephone Encounter (Signed)
Spoke with Archie Patten, she just wanted to let us know that pt plans to discus re-starting afib pills at her next appt since she hadn't been compliant in the past.

## 2021-05-15 NOTE — Telephone Encounter (Signed)
Last seen 5.25.2022 upcoming 8.23.2022

## 2021-05-15 NOTE — Telephone Encounter (Signed)
Summary: medication questions   Maureen [name] from [facility], called in to say patient is out of iron pills, vitamin d and sodium pills, says doesn't need refilll request because she can get these over the counter, but has to have this noted , and she also wants to speak with nurse about taking afib pllls. Please call back      Reason for Disposition  [1] Follow-up call to recent contact AND [2] information only call, no triage required  Answer Assessment - Initial Assessment Questions 1. REASON FOR CALL or QUESTION: "What is your reason for calling today?" or "How can I best help you?" or "What question do you have that I can help answer?"     Calling to report assessment of patient. [name] Reports patient weight: 86 lb Patient suffers from eating disorder- psychological  They will monitor BP/Afib- patient has agreed to discuss starting Afib medication at next appointment in August. Presently not taking supplements: Vitamin D, sodium, iron.Patient has been advised that she needs these because she is not getting in diet. ( Vitamin D is only active supplement on medication list) Speech therapy is scheduled for Thursday  Please alert to anything they need to do for patient. ( Fax order) Thank-you for quick response.  Protocols used: Information Only Call - No Triage-A-AH

## 2021-05-21 DIAGNOSIS — I480 Paroxysmal atrial fibrillation: Secondary | ICD-10-CM | POA: Diagnosis not present

## 2021-05-21 DIAGNOSIS — K2100 Gastro-esophageal reflux disease with esophagitis, without bleeding: Secondary | ICD-10-CM | POA: Diagnosis not present

## 2021-05-21 DIAGNOSIS — I8393 Asymptomatic varicose veins of bilateral lower extremities: Secondary | ICD-10-CM | POA: Diagnosis not present

## 2021-05-21 DIAGNOSIS — I700 Atherosclerosis of aorta: Secondary | ICD-10-CM | POA: Diagnosis not present

## 2021-05-21 DIAGNOSIS — I440 Atrioventricular block, first degree: Secondary | ICD-10-CM | POA: Diagnosis not present

## 2021-05-21 DIAGNOSIS — I10 Essential (primary) hypertension: Secondary | ICD-10-CM | POA: Diagnosis not present

## 2021-05-21 DIAGNOSIS — D61818 Other pancytopenia: Secondary | ICD-10-CM | POA: Diagnosis not present

## 2021-05-21 DIAGNOSIS — I890 Lymphedema, not elsewhere classified: Secondary | ICD-10-CM | POA: Diagnosis not present

## 2021-05-21 DIAGNOSIS — F330 Major depressive disorder, recurrent, mild: Secondary | ICD-10-CM | POA: Diagnosis not present

## 2021-05-22 ENCOUNTER — Telehealth: Payer: Self-pay

## 2021-05-22 DIAGNOSIS — F330 Major depressive disorder, recurrent, mild: Secondary | ICD-10-CM | POA: Diagnosis not present

## 2021-05-22 DIAGNOSIS — D61818 Other pancytopenia: Secondary | ICD-10-CM | POA: Diagnosis not present

## 2021-05-22 DIAGNOSIS — I890 Lymphedema, not elsewhere classified: Secondary | ICD-10-CM | POA: Diagnosis not present

## 2021-05-22 DIAGNOSIS — I8393 Asymptomatic varicose veins of bilateral lower extremities: Secondary | ICD-10-CM | POA: Diagnosis not present

## 2021-05-22 DIAGNOSIS — K2100 Gastro-esophageal reflux disease with esophagitis, without bleeding: Secondary | ICD-10-CM | POA: Diagnosis not present

## 2021-05-22 DIAGNOSIS — I700 Atherosclerosis of aorta: Secondary | ICD-10-CM | POA: Diagnosis not present

## 2021-05-22 DIAGNOSIS — I480 Paroxysmal atrial fibrillation: Secondary | ICD-10-CM | POA: Diagnosis not present

## 2021-05-22 DIAGNOSIS — I10 Essential (primary) hypertension: Secondary | ICD-10-CM | POA: Diagnosis not present

## 2021-05-22 DIAGNOSIS — I440 Atrioventricular block, first degree: Secondary | ICD-10-CM | POA: Diagnosis not present

## 2021-05-22 NOTE — Telephone Encounter (Signed)
Verbal orders given  

## 2021-05-22 NOTE — Telephone Encounter (Signed)
Called to give verbal orders. MB was full

## 2021-05-22 NOTE — Telephone Encounter (Signed)
Copied from CRM [id number]. Topic: Quick Communication - Home Health Verbal Orders >> May 21, 2021  4:39 PM Aretta Nip wrote: Caller/Agency: [facility]/ Roderick Pee Callback Number: [phone] Requesting /PT/Evaluation in addition Drenda Freeze Therapy: 1 time a wk for 6 wk for Dysphagia

## 2021-05-28 DIAGNOSIS — I480 Paroxysmal atrial fibrillation: Secondary | ICD-10-CM | POA: Diagnosis not present

## 2021-05-28 DIAGNOSIS — K2100 Gastro-esophageal reflux disease with esophagitis, without bleeding: Secondary | ICD-10-CM | POA: Diagnosis not present

## 2021-05-28 DIAGNOSIS — I440 Atrioventricular block, first degree: Secondary | ICD-10-CM | POA: Diagnosis not present

## 2021-05-28 DIAGNOSIS — F330 Major depressive disorder, recurrent, mild: Secondary | ICD-10-CM | POA: Diagnosis not present

## 2021-05-28 DIAGNOSIS — I890 Lymphedema, not elsewhere classified: Secondary | ICD-10-CM | POA: Diagnosis not present

## 2021-05-28 DIAGNOSIS — I8393 Asymptomatic varicose veins of bilateral lower extremities: Secondary | ICD-10-CM | POA: Diagnosis not present

## 2021-05-28 DIAGNOSIS — D61818 Other pancytopenia: Secondary | ICD-10-CM | POA: Diagnosis not present

## 2021-05-28 DIAGNOSIS — I10 Essential (primary) hypertension: Secondary | ICD-10-CM | POA: Diagnosis not present

## 2021-05-28 DIAGNOSIS — I700 Atherosclerosis of aorta: Secondary | ICD-10-CM | POA: Diagnosis not present

## 2021-05-29 DIAGNOSIS — F330 Major depressive disorder, recurrent, mild: Secondary | ICD-10-CM | POA: Diagnosis not present

## 2021-05-29 DIAGNOSIS — I8393 Asymptomatic varicose veins of bilateral lower extremities: Secondary | ICD-10-CM | POA: Diagnosis not present

## 2021-05-29 DIAGNOSIS — I480 Paroxysmal atrial fibrillation: Secondary | ICD-10-CM | POA: Diagnosis not present

## 2021-05-29 DIAGNOSIS — I440 Atrioventricular block, first degree: Secondary | ICD-10-CM | POA: Diagnosis not present

## 2021-05-29 DIAGNOSIS — I10 Essential (primary) hypertension: Secondary | ICD-10-CM | POA: Diagnosis not present

## 2021-05-29 DIAGNOSIS — D61818 Other pancytopenia: Secondary | ICD-10-CM | POA: Diagnosis not present

## 2021-05-29 DIAGNOSIS — I890 Lymphedema, not elsewhere classified: Secondary | ICD-10-CM | POA: Diagnosis not present

## 2021-05-29 DIAGNOSIS — K2100 Gastro-esophageal reflux disease with esophagitis, without bleeding: Secondary | ICD-10-CM | POA: Diagnosis not present

## 2021-05-29 DIAGNOSIS — I700 Atherosclerosis of aorta: Secondary | ICD-10-CM | POA: Diagnosis not present

## 2021-06-06 DIAGNOSIS — I8393 Asymptomatic varicose veins of bilateral lower extremities: Secondary | ICD-10-CM | POA: Diagnosis not present

## 2021-06-06 DIAGNOSIS — F330 Major depressive disorder, recurrent, mild: Secondary | ICD-10-CM | POA: Diagnosis not present

## 2021-06-06 DIAGNOSIS — I10 Essential (primary) hypertension: Secondary | ICD-10-CM | POA: Diagnosis not present

## 2021-06-06 DIAGNOSIS — I440 Atrioventricular block, first degree: Secondary | ICD-10-CM | POA: Diagnosis not present

## 2021-06-06 DIAGNOSIS — D61818 Other pancytopenia: Secondary | ICD-10-CM | POA: Diagnosis not present

## 2021-06-06 DIAGNOSIS — I480 Paroxysmal atrial fibrillation: Secondary | ICD-10-CM | POA: Diagnosis not present

## 2021-06-06 DIAGNOSIS — K2100 Gastro-esophageal reflux disease with esophagitis, without bleeding: Secondary | ICD-10-CM | POA: Diagnosis not present

## 2021-06-06 DIAGNOSIS — I700 Atherosclerosis of aorta: Secondary | ICD-10-CM | POA: Diagnosis not present

## 2021-06-06 DIAGNOSIS — I890 Lymphedema, not elsewhere classified: Secondary | ICD-10-CM | POA: Diagnosis not present

## 2021-06-07 DIAGNOSIS — I890 Lymphedema, not elsewhere classified: Secondary | ICD-10-CM | POA: Diagnosis not present

## 2021-06-07 DIAGNOSIS — I8393 Asymptomatic varicose veins of bilateral lower extremities: Secondary | ICD-10-CM | POA: Diagnosis not present

## 2021-06-07 DIAGNOSIS — I10 Essential (primary) hypertension: Secondary | ICD-10-CM | POA: Diagnosis not present

## 2021-06-07 DIAGNOSIS — I700 Atherosclerosis of aorta: Secondary | ICD-10-CM | POA: Diagnosis not present

## 2021-06-07 DIAGNOSIS — F330 Major depressive disorder, recurrent, mild: Secondary | ICD-10-CM | POA: Diagnosis not present

## 2021-06-07 DIAGNOSIS — K2100 Gastro-esophageal reflux disease with esophagitis, without bleeding: Secondary | ICD-10-CM | POA: Diagnosis not present

## 2021-06-07 DIAGNOSIS — I440 Atrioventricular block, first degree: Secondary | ICD-10-CM | POA: Diagnosis not present

## 2021-06-07 DIAGNOSIS — I480 Paroxysmal atrial fibrillation: Secondary | ICD-10-CM | POA: Diagnosis not present

## 2021-06-07 DIAGNOSIS — D61818 Other pancytopenia: Secondary | ICD-10-CM | POA: Diagnosis not present

## 2021-06-12 DIAGNOSIS — I890 Lymphedema, not elsewhere classified: Secondary | ICD-10-CM | POA: Diagnosis not present

## 2021-06-12 DIAGNOSIS — I700 Atherosclerosis of aorta: Secondary | ICD-10-CM | POA: Diagnosis not present

## 2021-06-12 DIAGNOSIS — K2100 Gastro-esophageal reflux disease with esophagitis, without bleeding: Secondary | ICD-10-CM | POA: Diagnosis not present

## 2021-06-12 DIAGNOSIS — I480 Paroxysmal atrial fibrillation: Secondary | ICD-10-CM | POA: Diagnosis not present

## 2021-06-12 DIAGNOSIS — I8393 Asymptomatic varicose veins of bilateral lower extremities: Secondary | ICD-10-CM | POA: Diagnosis not present

## 2021-06-12 DIAGNOSIS — I440 Atrioventricular block, first degree: Secondary | ICD-10-CM | POA: Diagnosis not present

## 2021-06-12 DIAGNOSIS — I10 Essential (primary) hypertension: Secondary | ICD-10-CM | POA: Diagnosis not present

## 2021-06-12 DIAGNOSIS — F330 Major depressive disorder, recurrent, mild: Secondary | ICD-10-CM | POA: Diagnosis not present

## 2021-06-12 DIAGNOSIS — D61818 Other pancytopenia: Secondary | ICD-10-CM | POA: Diagnosis not present

## 2021-06-13 DIAGNOSIS — I440 Atrioventricular block, first degree: Secondary | ICD-10-CM | POA: Diagnosis not present

## 2021-06-13 DIAGNOSIS — I700 Atherosclerosis of aorta: Secondary | ICD-10-CM | POA: Diagnosis not present

## 2021-06-13 DIAGNOSIS — I890 Lymphedema, not elsewhere classified: Secondary | ICD-10-CM | POA: Diagnosis not present

## 2021-06-13 DIAGNOSIS — I10 Essential (primary) hypertension: Secondary | ICD-10-CM | POA: Diagnosis not present

## 2021-06-13 DIAGNOSIS — I480 Paroxysmal atrial fibrillation: Secondary | ICD-10-CM | POA: Diagnosis not present

## 2021-06-13 DIAGNOSIS — K2100 Gastro-esophageal reflux disease with esophagitis, without bleeding: Secondary | ICD-10-CM | POA: Diagnosis not present

## 2021-06-13 DIAGNOSIS — F330 Major depressive disorder, recurrent, mild: Secondary | ICD-10-CM | POA: Diagnosis not present

## 2021-06-13 DIAGNOSIS — I8393 Asymptomatic varicose veins of bilateral lower extremities: Secondary | ICD-10-CM | POA: Diagnosis not present

## 2021-06-13 DIAGNOSIS — D61818 Other pancytopenia: Secondary | ICD-10-CM | POA: Diagnosis not present

## 2021-06-17 IMAGING — CT CT NECK W/ CM
2 series · 14 of 29 positions shown, 18 images · IV contrast (omnipaque)
Comparison: None similar

CLINICAL DATA: Dysphagia, unexplained. Trouble swallowing for 1
month

EXAM:
CT NECK WITH CONTRAST
TECHNIQUE: Multidetector CT imaging of the neck was performed using the
standard protocol following the bolus administration of intravenous
contrast.
CONTRAST:  75mL OMNIPAQUE IOHEXOL 300 MG/ML  SOLN

[Series 2: axial neck · axial · 0.59mm/px · z∈[-208,-70]mm · 9 of 83 slices shown, 12 images]
[im 7/83  soft-tissue]
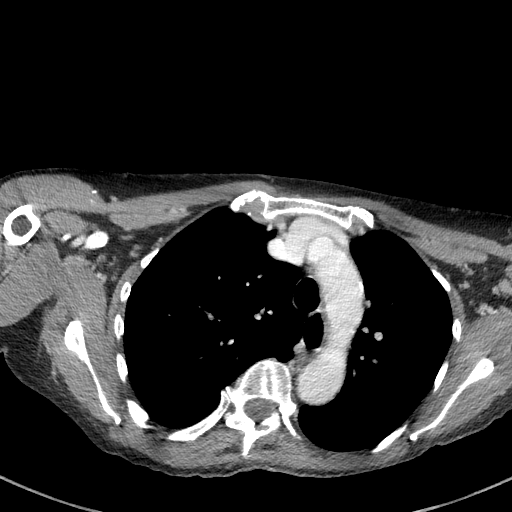
[im 7/83  bone]
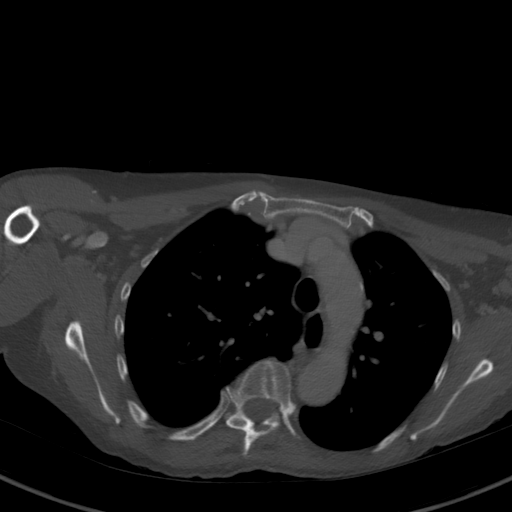
[im 19/83  bone]
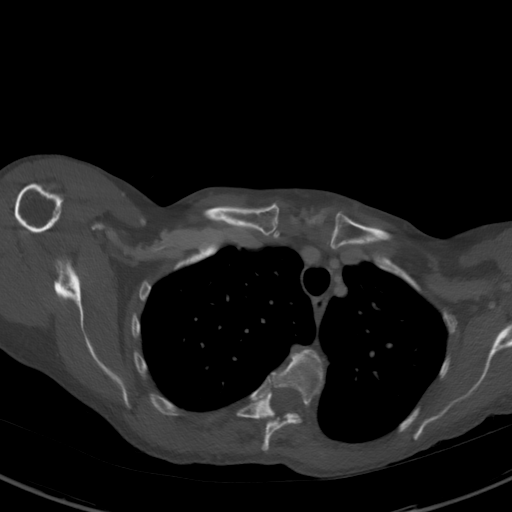
[im 26/83  bone]
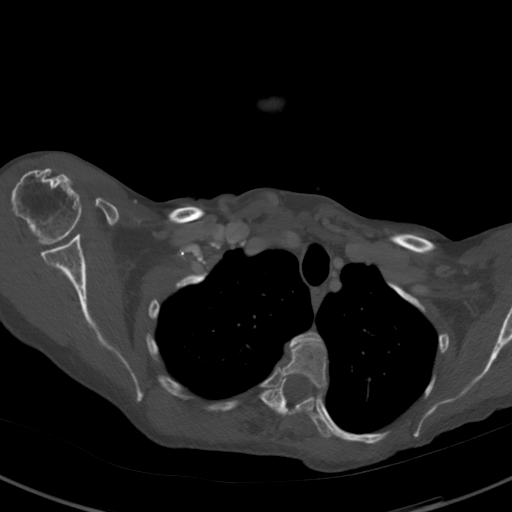
[im 32/83  bone]
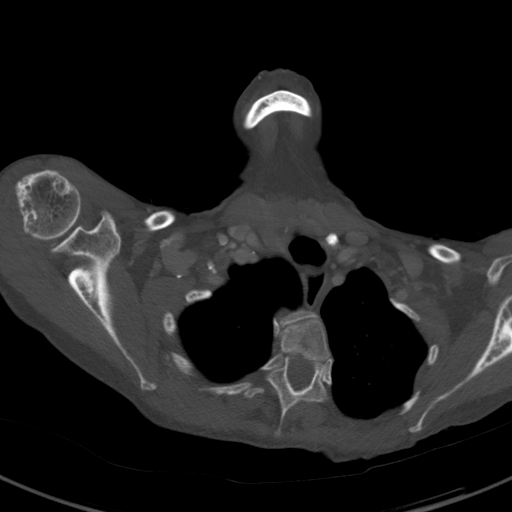
[im 45/83  soft-tissue]
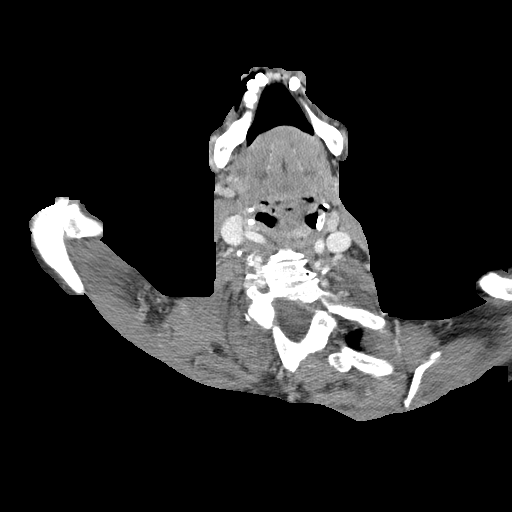
[im 45/83  bone]
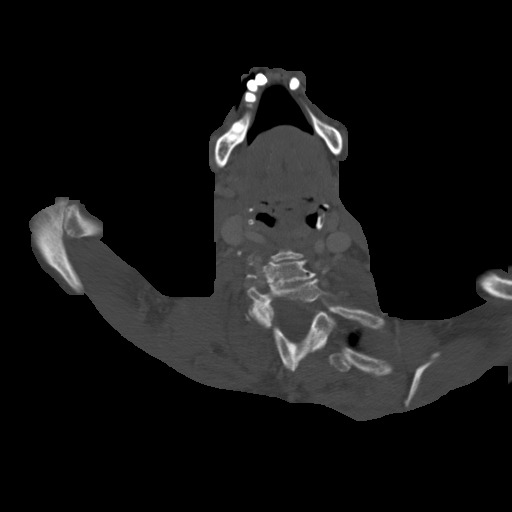
[im 51/83  bone]
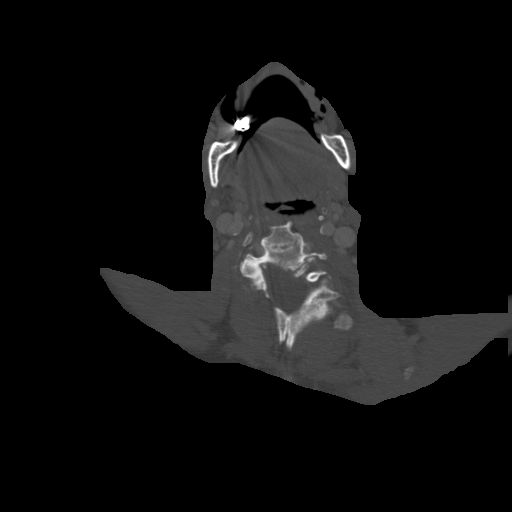
[im 57/83  bone]
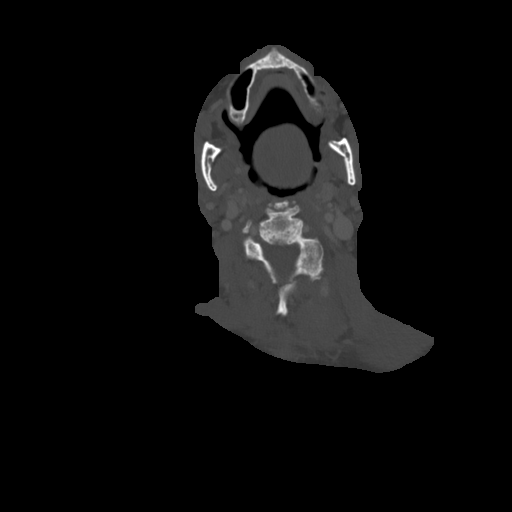
[im 70/83  bone]
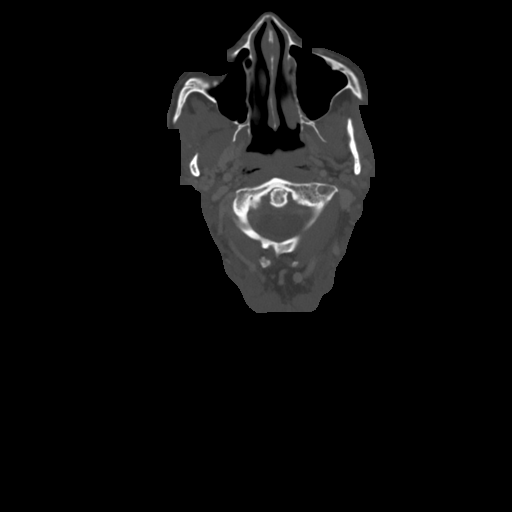
[im 76/83  soft-tissue]
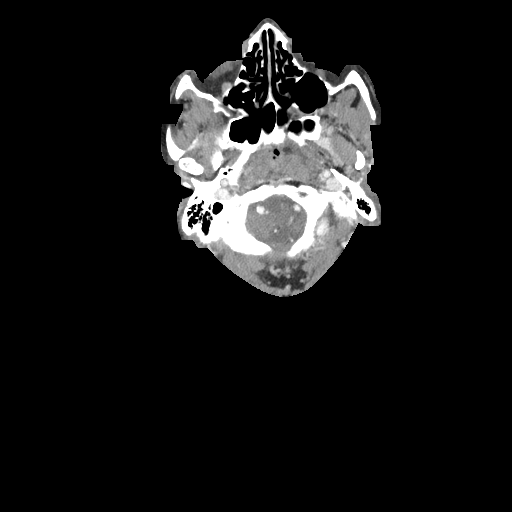
[im 76/83  bone]
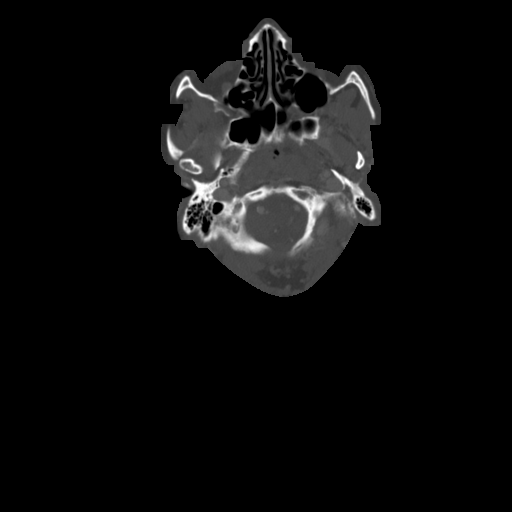

[Series 5: sag neck · sagittal · 0.41mm/px · 5 of 66 slices shown, 6 images]
[im 22/66  bone]
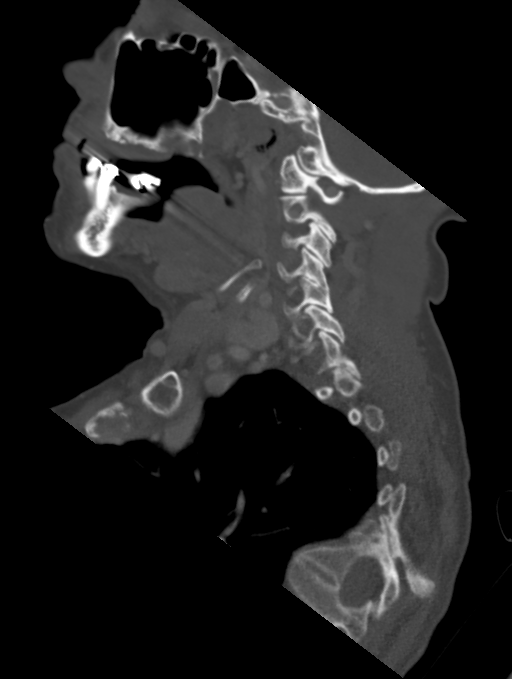
[im 28/66  bone]
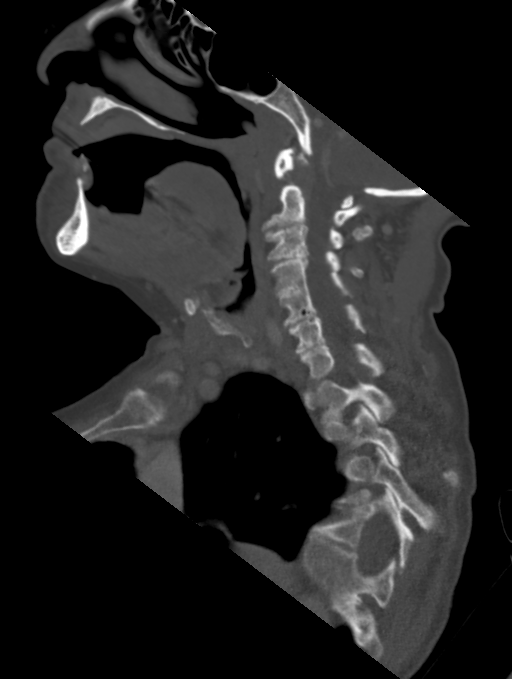
[im 33/66  soft-tissue]
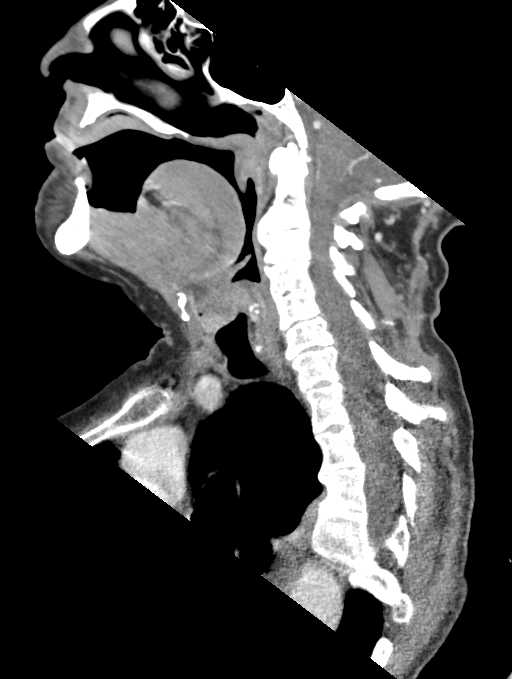
[im 33/66  bone]
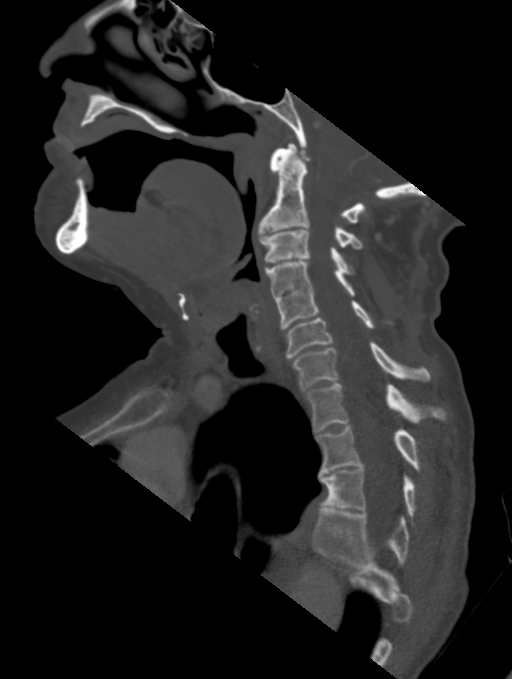
[im 38/66  bone]
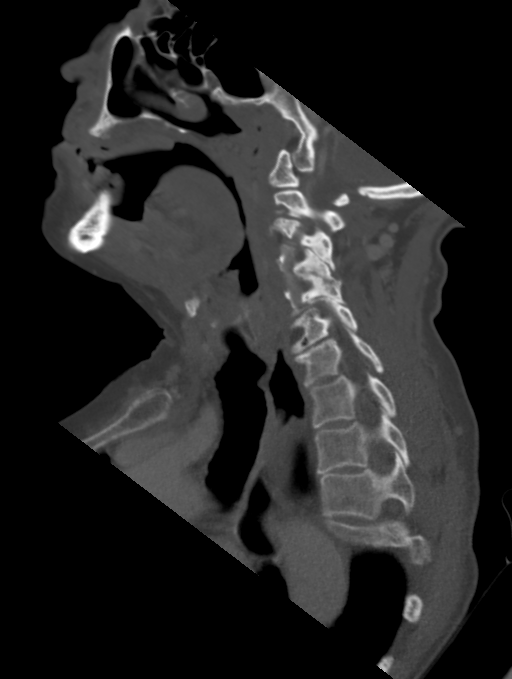
[im 44/66  bone]
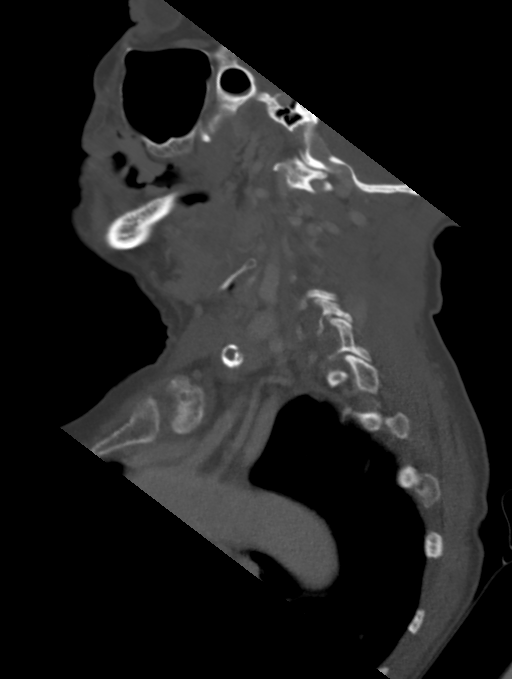

[14 of 29 positions shown; findings below may reference images not displayed]

FINDINGS: Pharynx and larynx: No evidence of mass or swelling. No appreciable
airway narrowing. Normal appearance of the upper esophagus.

Salivary glands: No inflammation, mass, or stone.

Thyroid: Benign coarse calcification in the left lobe.

Lymph nodes: None enlarged or abnormal density.

Vascular: Negative

Limited intracranial: Negative

Visualized orbits: Negative

Mastoids and visualized paranasal sinuses: Clear

Skeleton: Advanced kyphoscoliosis in thoracic spine with
compensatory neck curvature and degenerative disease.

Upper chest: Negative
IMPRESSION: No acute finding or explanation for dysphagia.

## 2021-06-18 ENCOUNTER — Ambulatory Visit: Payer: Medicare HMO | Admitting: Family Medicine

## 2021-06-18 NOTE — Progress Notes (Deleted)
Name: Maureen [name]   MRN: [id number]    DOB: 06-Jul-1946   Date:06/18/2021       Progress Note  Subjective  Chief Complaint  Follow Up  HPI  Malnutrition: BMI was down to 15.72 , she has lost 43 lbs since 07/2019 ( one year )  She had seen hematologist , gastroenterologist, psychiatrist. She continue to refuse to eat and sodium dropped and also white count, we sent patient to [facility] for possible admission on 09/14/2020. She was admitted and offered to have NG tube however she refused, she had multiple labs done , given medication to help her bp she was given salt tablets. She was sent from [facility] to a long term care facility/rehab on 09/19/2020 and was supposed to stay for 20 days, however her insurance only allowed 10 days so she has been home since 09/29/2020. Since last visit with me January 2022 weight is up 2 lbs. She is eating chicken pot pie, rotisserie chicken, mash potatoes, apple sauce, and two ensures per day, but has not been going to 4 per day as recommended. Son feels like having ST was helpful and we will refer to home health again   MDD: she was seen by Dr. Salvatore Decent - psychiatrist , last visit 05/2020 and has been released per patient/son's request, she does not want to take Remeron - afraid of polypharmacy. She was on clonazepam but has been off for months and has not noticed any difference in symptoms   Hypothyroidism: she has been taking medication daily now, 88 mcg daily , during hospital stay her TSH was elevated, last visit TSH was elevated, she states she has been compliant lately and we will recheck levels. No change in bowel movements, denies dry skin    Paroxysmal Afib: she never started Eliquis, she states cardiologist called her for a visit but she did not schedule it. She denies chest pain or palpitation, she is afraid of taking medications.Unchanged  Atherosclerosis of aorta: found on CT chest , patient is not interested on statin therapy, palliative care. Same     Pancytopenia: she has been seeing Dr. Orlie Dakin, showed pancytopenia however last levels showed normal platelets, low wbc and anemia improved , but still has low white count but stable   Patient Active Problem List   Diagnosis Date Noted   Hyponatremia 09/24/2020   Protein-calorie malnutrition, severe 09/13/2020   Esophageal motility disorder    Palliative care by specialist    Advanced care planning/counseling discussion    Failure to thrive in adult 09/11/2020   Avoidant-restrictive food intake disorder (ARFID) 08/30/2020   Paroxysmal atrial fibrillation (HCC) 03/22/2020   Vitamin D deficiency 03/01/2020   Dysphagia    Arrhythmia 08/25/2019   Rectal bleeding    Low grade squamous intraepith lesion on cytologic smear cervix (lgsil) 08/04/2018   Chronic venous insufficiency 07/05/2018   Lymphedema 07/05/2018   Hypertension 04/02/2018   Swelling of limb 04/02/2018   Depression, major, recurrent, mild (HCC) 12/29/2017   Leukopenia 05/24/2017   Allergic rhinitis, seasonal 06/29/2015   Clavus 06/29/2015   1st degree AV block 06/29/2015   Hammer toe 06/29/2015   H/O: HTN (hypertension) 06/29/2015   Blood glucose elevated 06/29/2015   Floater, vitreous 06/29/2015   Bilateral tinnitus 06/29/2015   Hypothyroidism (acquired) 04/02/2015   Gastroesophageal reflux disease 04/02/2015   Hammer toe of right foot 04/02/2015   Scoliosis of thoracic spine 04/02/2015   Urethral prolapse 04/02/2015   Corn of toe 04/02/2015   Hyperlipidemia 04/02/2015  Varicose veins of both lower extremities 04/02/2015    Past Surgical History:  Procedure Laterality Date   ABDOMINAL HYSTERECTOMY  1989   APPENDECTOMY  1989   BREAST EXCISIONAL BIOPSY Bilateral    multiple biopsies negative   BREAST SURGERY     Several   COLONOSCOPY WITH PROPOFOL N/A 07/28/2019   Procedure: COLONOSCOPY WITH PROPOFOL;  Surgeon: Toney Reil, MD;  Location: [facility] ENDOSCOPY;  Service: Gastroenterology;  Laterality:  N/A;   ESOPHAGOGASTRODUODENOSCOPY (EGD) WITH PROPOFOL N/A 11/11/2019   Procedure: ESOPHAGOGASTRODUODENOSCOPY (EGD) WITH PROPOFOL;  Surgeon: Toney Reil, MD;  Location: [facility] ENDOSCOPY;  Service: Gastroenterology;  Laterality: N/A;   TUMOR EXCISION  1989   same time as hysterectomy    Family History  Problem Relation Age of Onset   Diabetes Father 56       DM complications   Hypertension Mother 52       CKD   Thyroid disease Mother    Heart failure Mother    Kidney disease Mother    Depression Mother    Breast cancer Sister 8   Depression Sister    Kidney disease Other    Breast cancer Maternal Aunt    Breast cancer Cousin    Anuerysm Son 70   Diabetes Son     Social History   Tobacco Use   Smoking status: Never   Smokeless tobacco: Never   Tobacco comments:    smoking cessation materials not required  Substance Use Topics   Alcohol use: Never    Alcohol/week: 0.0 standard drinks     Current Outpatient Medications:    Ensure (ENSURE), Take 237 mLs by mouth. Pt drinking approx 3 ensures per day, Disp: , Rfl:    levothyroxine (SYNTHROID) 100 MCG tablet, Take 1 tablet (100 mcg total) by mouth daily., Disp: 90 tablet, Rfl: 0   omeprazole (PRILOSEC) 40 MG capsule, Take 1 capsule (40 mg total) by mouth daily before breakfast., Disp: 90 capsule, Rfl: 3   Vitamin D, Ergocalciferol, (DRISDOL) 1.25 MG (50000 UNIT) CAPS capsule, Take 1 capsule (50,000 Units total) by mouth every 7 (seven) days., Disp: 12 capsule, Rfl: 0  Allergies  Allergen Reactions   Aspirin     Tinnitus   Citalopram Other (See Comments)   Codeine Nausea And Vomiting   Penicillins Rash    Has patient had a PCN reaction causing immediate rash, facial/tongue/throat swelling, SOB or lightheadedness with hypotension: Yes Has patient had a PCN reaction causing severe rash involving mucus membranes or skin necrosis: No Has patient had a PCN reaction that required hospitalization No Has patient had a PCN  reaction occurring within the last 10 years: No If all of the above answers are "NO", then may proceed with Cephalosporin use.    I personally reviewed {Reviewed:14835} with the patient/caregiver today.   ROS  ***  Objective  There were no vitals filed for this visit.  There is no height or weight on file to calculate BMI.  Physical Exam ***  Recent Results (from the past 2160 hour(s))  COMPLETE METABOLIC PANEL WITH GFR     Status: None   Collection Time: 03/20/21 11:45 AM  Result Value Ref Range   Glucose, Bld 78 65 - 99 mg/dL    Comment: .            Fasting reference interval .    BUN 8 7 - 25 mg/dL   Creat 3.15 4.00 - 8.67 mg/dL    Comment: For patients >49  years of age, the reference limit for Creatinine is approximately 13% higher for people identified as African-American. .    GFR, Est Non African American 88 > OR = 60 mL/min/1.41m2   GFR, Est African American 102 > OR = 60 mL/min/1.21m2   BUN/Creatinine Ratio NOT APPLICABLE 6 - 22 (calc)   Sodium 135 135 - 146 mmol/L   Potassium 4.6 3.5 - 5.3 mmol/L   Chloride 98 98 - 110 mmol/L   CO2 27 20 - 32 mmol/L   Calcium 9.2 8.6 - 10.4 mg/dL   Total Protein 7.7 6.1 - 8.1 g/dL   Albumin 4.2 3.6 - 5.1 g/dL   Globulin 3.5 1.9 - 3.7 g/dL (calc)   AG Ratio 1.2 1.0 - 2.5 (calc)   Total Bilirubin 0.4 0.2 - 1.2 mg/dL   Alkaline phosphatase (APISO) 54 37 - 153 U/L   AST 24 10 - 35 U/L   ALT 14 6 - 29 U/L  TSH     Status: Abnormal   Collection Time: 03/20/21 11:45 AM  Result Value Ref Range   TSH 17.62 (H) 0.40 - 4.50 mIU/L    Diabetic Foot Exam: Diabetic Foot Exam - Simple   No data filed    ***  PHQ2/9: Depression screen [facility] 2/9 03/20/2021 11/20/2020 10/15/2020 08/30/2020 08/09/2020  Decreased Interest 1 1 3 3 2   Down, Depressed, Hopeless 1 1 2 3 2   PHQ - 2 Score 2 2 5 6 4   Altered sleeping 0 0 0 0 0  Tired, decreased energy 3 0 1 1 1   Change in appetite 1 1 1 2 3   Feeling bad or failure about yourself  1  1 0 3 2  Trouble concentrating 0 0 2 0 2  Moving slowly or fidgety/restless 0 0 0 2 0  Suicidal thoughts 0 0 0 0 0  PHQ-9 Score 7 4 9 14 12   Difficult doing work/chores Somewhat difficult - - Very difficult Somewhat difficult  Some recent data might be hidden    phq 9 is {gen pos ***  Fall Risk: Fall Risk  03/20/2021 11/20/2020 10/15/2020 08/30/2020 08/09/2020  Falls in the past year? 0 0 0 0 0  Number falls in past yr: 0 0 0 0 0  Injury with Fall? 0 0 0 0 0  Risk for fall due to : - - - - No Fall Risks  Risk for fall due to: Comment - - - - -  Follow up Falls prevention discussed - - - -   ***   Functional Status Survey:   ***   Assessment & Plan  *** There are no diagnoses linked to this encounter.

## 2021-06-19 DIAGNOSIS — F330 Major depressive disorder, recurrent, mild: Secondary | ICD-10-CM | POA: Diagnosis not present

## 2021-06-19 DIAGNOSIS — I480 Paroxysmal atrial fibrillation: Secondary | ICD-10-CM | POA: Diagnosis not present

## 2021-06-19 DIAGNOSIS — I890 Lymphedema, not elsewhere classified: Secondary | ICD-10-CM | POA: Diagnosis not present

## 2021-06-19 DIAGNOSIS — K2100 Gastro-esophageal reflux disease with esophagitis, without bleeding: Secondary | ICD-10-CM | POA: Diagnosis not present

## 2021-06-19 DIAGNOSIS — D61818 Other pancytopenia: Secondary | ICD-10-CM | POA: Diagnosis not present

## 2021-06-19 DIAGNOSIS — I10 Essential (primary) hypertension: Secondary | ICD-10-CM | POA: Diagnosis not present

## 2021-06-19 DIAGNOSIS — I700 Atherosclerosis of aorta: Secondary | ICD-10-CM | POA: Diagnosis not present

## 2021-06-19 DIAGNOSIS — I440 Atrioventricular block, first degree: Secondary | ICD-10-CM | POA: Diagnosis not present

## 2021-06-19 DIAGNOSIS — I8393 Asymptomatic varicose veins of bilateral lower extremities: Secondary | ICD-10-CM | POA: Diagnosis not present

## 2021-06-20 DIAGNOSIS — I10 Essential (primary) hypertension: Secondary | ICD-10-CM | POA: Diagnosis not present

## 2021-06-20 DIAGNOSIS — I8393 Asymptomatic varicose veins of bilateral lower extremities: Secondary | ICD-10-CM | POA: Diagnosis not present

## 2021-06-20 DIAGNOSIS — K2100 Gastro-esophageal reflux disease with esophagitis, without bleeding: Secondary | ICD-10-CM | POA: Diagnosis not present

## 2021-06-20 DIAGNOSIS — F330 Major depressive disorder, recurrent, mild: Secondary | ICD-10-CM | POA: Diagnosis not present

## 2021-06-20 DIAGNOSIS — I700 Atherosclerosis of aorta: Secondary | ICD-10-CM | POA: Diagnosis not present

## 2021-06-20 DIAGNOSIS — I480 Paroxysmal atrial fibrillation: Secondary | ICD-10-CM | POA: Diagnosis not present

## 2021-06-20 DIAGNOSIS — I890 Lymphedema, not elsewhere classified: Secondary | ICD-10-CM | POA: Diagnosis not present

## 2021-06-20 DIAGNOSIS — I440 Atrioventricular block, first degree: Secondary | ICD-10-CM | POA: Diagnosis not present

## 2021-06-20 DIAGNOSIS — D61818 Other pancytopenia: Secondary | ICD-10-CM | POA: Diagnosis not present

## 2021-06-25 DIAGNOSIS — K2100 Gastro-esophageal reflux disease with esophagitis, without bleeding: Secondary | ICD-10-CM | POA: Diagnosis not present

## 2021-06-25 DIAGNOSIS — I700 Atherosclerosis of aorta: Secondary | ICD-10-CM | POA: Diagnosis not present

## 2021-06-25 DIAGNOSIS — I8393 Asymptomatic varicose veins of bilateral lower extremities: Secondary | ICD-10-CM | POA: Diagnosis not present

## 2021-06-25 DIAGNOSIS — I10 Essential (primary) hypertension: Secondary | ICD-10-CM | POA: Diagnosis not present

## 2021-06-25 DIAGNOSIS — I480 Paroxysmal atrial fibrillation: Secondary | ICD-10-CM | POA: Diagnosis not present

## 2021-06-25 DIAGNOSIS — F330 Major depressive disorder, recurrent, mild: Secondary | ICD-10-CM | POA: Diagnosis not present

## 2021-06-25 DIAGNOSIS — I440 Atrioventricular block, first degree: Secondary | ICD-10-CM | POA: Diagnosis not present

## 2021-06-25 DIAGNOSIS — I890 Lymphedema, not elsewhere classified: Secondary | ICD-10-CM | POA: Diagnosis not present

## 2021-06-25 DIAGNOSIS — D61818 Other pancytopenia: Secondary | ICD-10-CM | POA: Diagnosis not present

## 2021-06-26 DIAGNOSIS — K2100 Gastro-esophageal reflux disease with esophagitis, without bleeding: Secondary | ICD-10-CM | POA: Diagnosis not present

## 2021-06-26 DIAGNOSIS — I480 Paroxysmal atrial fibrillation: Secondary | ICD-10-CM | POA: Diagnosis not present

## 2021-06-26 DIAGNOSIS — I440 Atrioventricular block, first degree: Secondary | ICD-10-CM | POA: Diagnosis not present

## 2021-06-26 DIAGNOSIS — F330 Major depressive disorder, recurrent, mild: Secondary | ICD-10-CM | POA: Diagnosis not present

## 2021-06-26 DIAGNOSIS — I700 Atherosclerosis of aorta: Secondary | ICD-10-CM | POA: Diagnosis not present

## 2021-06-26 DIAGNOSIS — I10 Essential (primary) hypertension: Secondary | ICD-10-CM | POA: Diagnosis not present

## 2021-06-26 DIAGNOSIS — I890 Lymphedema, not elsewhere classified: Secondary | ICD-10-CM | POA: Diagnosis not present

## 2021-06-26 DIAGNOSIS — I8393 Asymptomatic varicose veins of bilateral lower extremities: Secondary | ICD-10-CM | POA: Diagnosis not present

## 2021-06-26 DIAGNOSIS — D61818 Other pancytopenia: Secondary | ICD-10-CM | POA: Diagnosis not present

## 2021-06-27 DIAGNOSIS — I8393 Asymptomatic varicose veins of bilateral lower extremities: Secondary | ICD-10-CM | POA: Diagnosis not present

## 2021-06-27 DIAGNOSIS — D61818 Other pancytopenia: Secondary | ICD-10-CM | POA: Diagnosis not present

## 2021-06-27 DIAGNOSIS — I10 Essential (primary) hypertension: Secondary | ICD-10-CM | POA: Diagnosis not present

## 2021-06-27 DIAGNOSIS — I480 Paroxysmal atrial fibrillation: Secondary | ICD-10-CM | POA: Diagnosis not present

## 2021-06-27 DIAGNOSIS — I440 Atrioventricular block, first degree: Secondary | ICD-10-CM | POA: Diagnosis not present

## 2021-06-27 DIAGNOSIS — I700 Atherosclerosis of aorta: Secondary | ICD-10-CM | POA: Diagnosis not present

## 2021-06-27 DIAGNOSIS — I890 Lymphedema, not elsewhere classified: Secondary | ICD-10-CM | POA: Diagnosis not present

## 2021-06-27 DIAGNOSIS — K2100 Gastro-esophageal reflux disease with esophagitis, without bleeding: Secondary | ICD-10-CM | POA: Diagnosis not present

## 2021-06-27 DIAGNOSIS — F330 Major depressive disorder, recurrent, mild: Secondary | ICD-10-CM | POA: Diagnosis not present

## 2021-07-03 DIAGNOSIS — F330 Major depressive disorder, recurrent, mild: Secondary | ICD-10-CM | POA: Diagnosis not present

## 2021-07-03 DIAGNOSIS — I10 Essential (primary) hypertension: Secondary | ICD-10-CM | POA: Diagnosis not present

## 2021-07-03 DIAGNOSIS — I8393 Asymptomatic varicose veins of bilateral lower extremities: Secondary | ICD-10-CM | POA: Diagnosis not present

## 2021-07-03 DIAGNOSIS — D61818 Other pancytopenia: Secondary | ICD-10-CM | POA: Diagnosis not present

## 2021-07-03 DIAGNOSIS — I480 Paroxysmal atrial fibrillation: Secondary | ICD-10-CM | POA: Diagnosis not present

## 2021-07-03 DIAGNOSIS — I890 Lymphedema, not elsewhere classified: Secondary | ICD-10-CM | POA: Diagnosis not present

## 2021-07-03 DIAGNOSIS — K2100 Gastro-esophageal reflux disease with esophagitis, without bleeding: Secondary | ICD-10-CM | POA: Diagnosis not present

## 2021-07-03 DIAGNOSIS — I700 Atherosclerosis of aorta: Secondary | ICD-10-CM | POA: Diagnosis not present

## 2021-07-03 DIAGNOSIS — I440 Atrioventricular block, first degree: Secondary | ICD-10-CM | POA: Diagnosis not present

## 2021-07-08 ENCOUNTER — Other Ambulatory Visit: Payer: Self-pay

## 2021-07-08 ENCOUNTER — Encounter: Payer: Self-pay | Admitting: Family Medicine

## 2021-07-08 ENCOUNTER — Ambulatory Visit (INDEPENDENT_AMBULATORY_CARE_PROVIDER_SITE_OTHER): Payer: Medicare HMO | Admitting: Family Medicine

## 2021-07-08 VITALS — BP 112/76 | HR 68 | Temp 97.6°F | Resp 16 | Ht 61.0 in | Wt 79.4 lb

## 2021-07-08 DIAGNOSIS — I700 Atherosclerosis of aorta: Secondary | ICD-10-CM

## 2021-07-08 DIAGNOSIS — F5082 Avoidant/restrictive food intake disorder: Secondary | ICD-10-CM

## 2021-07-08 DIAGNOSIS — E43 Unspecified severe protein-calorie malnutrition: Secondary | ICD-10-CM

## 2021-07-08 DIAGNOSIS — E039 Hypothyroidism, unspecified: Secondary | ICD-10-CM | POA: Diagnosis not present

## 2021-07-08 DIAGNOSIS — D61818 Other pancytopenia: Secondary | ICD-10-CM

## 2021-07-08 DIAGNOSIS — F330 Major depressive disorder, recurrent, mild: Secondary | ICD-10-CM | POA: Diagnosis not present

## 2021-07-08 DIAGNOSIS — F419 Anxiety disorder, unspecified: Secondary | ICD-10-CM

## 2021-07-08 DIAGNOSIS — I480 Paroxysmal atrial fibrillation: Secondary | ICD-10-CM

## 2021-07-08 DIAGNOSIS — E559 Vitamin D deficiency, unspecified: Secondary | ICD-10-CM

## 2021-07-08 MED ORDER — VITAMIN D-3 5000 UNIT/ML SL LIQD
1.0000 mL | SUBLINGUAL | 5 refills | Status: DC
Start: 2021-07-08 — End: 2022-03-13

## 2021-07-08 NOTE — Progress Notes (Signed)
Name: Maureen [name]   MRN: [id number]    DOB: 07/25/1946   Date:07/08/2021       Progress Note  Subjective  Chief Complaint  Follow Up  HPI  Malnutrition: BMI was down to 15.72 , she has lost 43 lbs since 07/2019 ( one year )  She had seen hematologist , gastroenterologist, psychiatrist. She continue to refuse to eat and sodium dropped and also white count, we sent patient to [facility] for possible admission on 09/14/2020. She was admitted and offered to have NG tube however she refused, she had multiple labs done , given medication to help her bp she was given salt tablets. She was sent from [facility] to a long term care facility/rehab on 09/19/2020 and was supposed to stay for 20 days, however her insurance only allowed 10 days so she has been home since 09/29/2020. She has lost weight again, down from 86 lbs to 79.4 lbs. Son is frustrated.  She has home health and had Speech therapy also PT was helping her move around. Son is considering a long term care facility and patient is not opposed, advised them to do some research   MDD: she was seen by Dr. Salvatore Decent - psychiatrist , last visit 05/2020 and has been released per patient/son's request, she does not want to take Remeron - afraid of polypharmacy. She was on clonazepam but has been off for months and has not noticed any difference in symptoms . Unchanged. Phq 9 is 5   Hypothyroidism: she has been taking medication daily now, 88 mcg daily , we will repeat TSH today. She denies palpitation, she feels weak, but is also malnourished    Paroxysmal Afib: she never started Eliquis, she states cardiologist called her for a visit but she did not schedule it. She denies chest pain or palpitation, she is afraid of taking medications.Son is aware of risk of strokes   Atherosclerosis of aorta: found on CT chest , patient is not interested on statin therapy, she was under the care of palliative care but son is not sure is she is currently enrolled.   Pancytopenia:  she has been seeing Dr. Orlie Dakin, showed pancytopenia and is going back to see him in October   Patient Active Problem List   Diagnosis Date Noted   Hyponatremia 09/24/2020   Protein-calorie malnutrition, severe 09/13/2020   Esophageal motility disorder    Palliative care by specialist    Advanced care planning/counseling discussion    Failure to thrive in adult 09/11/2020   Avoidant-restrictive food intake disorder (ARFID) 08/30/2020   Paroxysmal atrial fibrillation (HCC) 03/22/2020   Vitamin D deficiency 03/01/2020   Dysphagia    Arrhythmia 08/25/2019   Rectal bleeding    Low grade squamous intraepith lesion on cytologic smear cervix (lgsil) 08/04/2018   Chronic venous insufficiency 07/05/2018   Lymphedema 07/05/2018   Hypertension 04/02/2018   Swelling of limb 04/02/2018   Depression, major, recurrent, mild (HCC) 12/29/2017   Leukopenia 05/24/2017   Allergic rhinitis, seasonal 06/29/2015   Clavus 06/29/2015   1st degree AV block 06/29/2015   Hammer toe 06/29/2015   H/O: HTN (hypertension) 06/29/2015   Blood glucose elevated 06/29/2015   Floater, vitreous 06/29/2015   Bilateral tinnitus 06/29/2015   Hypothyroidism (acquired) 04/02/2015   Gastroesophageal reflux disease 04/02/2015   Hammer toe of right foot 04/02/2015   Scoliosis of thoracic spine 04/02/2015   Urethral prolapse 04/02/2015   Corn of toe 04/02/2015   Hyperlipidemia 04/02/2015   Varicose veins of both  lower extremities 04/02/2015    Past Surgical History:  Procedure Laterality Date   ABDOMINAL HYSTERECTOMY  1989   APPENDECTOMY  1989   BREAST EXCISIONAL BIOPSY Bilateral    multiple biopsies negative   BREAST SURGERY     Several   COLONOSCOPY WITH PROPOFOL N/A 07/28/2019   Procedure: COLONOSCOPY WITH PROPOFOL;  Surgeon: Toney Reil, MD;  Location: [facility] ENDOSCOPY;  Service: Gastroenterology;  Laterality: N/A;   ESOPHAGOGASTRODUODENOSCOPY (EGD) WITH PROPOFOL N/A 11/11/2019   Procedure:  ESOPHAGOGASTRODUODENOSCOPY (EGD) WITH PROPOFOL;  Surgeon: Toney Reil, MD;  Location: [facility] ENDOSCOPY;  Service: Gastroenterology;  Laterality: N/A;   TUMOR EXCISION  1989   same time as hysterectomy    Family History  Problem Relation Age of Onset   Diabetes Father 56       DM complications   Hypertension Mother 41       CKD   Thyroid disease Mother    Heart failure Mother    Kidney disease Mother    Depression Mother    Breast cancer Sister 70   Depression Sister    Kidney disease Other    Breast cancer Maternal Aunt    Breast cancer Cousin    Anuerysm Son 8   Diabetes Son     Social History   Tobacco Use   Smoking status: Never   Smokeless tobacco: Never   Tobacco comments:    smoking cessation materials not required  Substance Use Topics   Alcohol use: Never    Alcohol/week: 0.0 standard drinks     Current Outpatient Medications:    Cholecalciferol (VITAMIN D-3) 5000 UNIT/ML LIQD, Place 1 mL under the tongue every other day., Disp: 30 mL, Rfl: 5   Ensure (ENSURE), Take 237 mLs by mouth. Pt drinking approx 3 ensures per day, Disp: , Rfl:    levothyroxine (SYNTHROID) 100 MCG tablet, Take 1 tablet (100 mcg total) by mouth daily., Disp: 90 tablet, Rfl: 0   omeprazole (PRILOSEC) 40 MG capsule, Take 1 capsule (40 mg total) by mouth daily before breakfast., Disp: 90 capsule, Rfl: 3  Allergies  Allergen Reactions   Aspirin     Tinnitus   Citalopram Other (See Comments)   Codeine Nausea And Vomiting   Penicillins Rash    Has patient had a PCN reaction causing immediate rash, facial/tongue/throat swelling, SOB or lightheadedness with hypotension: Yes Has patient had a PCN reaction causing severe rash involving mucus membranes or skin necrosis: No Has patient had a PCN reaction that required hospitalization No Has patient had a PCN reaction occurring within the last 10 years: No If all of the above answers are "NO", then may proceed with Cephalosporin use.     I personally reviewed active problem list, medication list, allergies, family history, social history, health maintenance with the patient/caregiver today.   ROS  Constitutional: Negative for fever, positive for weight change.  Respiratory: Negative for cough and shortness of breath.   Cardiovascular: Negative for chest pain or palpitations.  Gastrointestinal: Negative for abdominal pain, no bowel changes.  Musculoskeletal: Negative for gait problem or joint swelling.  Skin: Negative for rash.  Neurological: Negative for dizziness or headache.  No other specific complaints in a complete review of systems (except as listed in HPI above).   Objective  Vitals:   07/08/21 1426  BP: 112/76  Pulse: 68  Resp: 16  Temp: 97.6 F (36.4 C)  Weight: 79 lb 6.4 oz (36 kg)  Height: 5\' 1"  (1.549 m)  Body mass index is 15 kg/m.  Physical Exam  Constitutional: Patient appears cachetic . No distress.  HEENT: head atraumatic, normocephalic, pupils equal and reactive to light, neck supple, temporal waisting  Cardiovascular: Normal rate, irregular rhythm and normal heart sounds.  No murmur heard. No BLE edema. Pulmonary/Chest: Effort normal and breath sounds normal. No respiratory distress. Abdominal: Soft.  There is no tenderness. Psychiatric: Patient has a normal mood and affect. behavior is normal. Judgment and thought content normal.    PHQ2/9: Depression screen [facility] 2/9 07/08/2021 03/20/2021 11/20/2020 10/15/2020 08/30/2020  Decreased Interest 1 1 1 3 3   Down, Depressed, Hopeless 1 1 1 2 3   PHQ - 2 Score 2 2 2 5 6   Altered sleeping 0 0 0 0 0  Tired, decreased energy 1 3 0 1 1  Change in appetite 1 1 1 1 2   Feeling bad or failure about yourself  1 1 1  0 3  Trouble concentrating 0 0 0 2 0  Moving slowly or fidgety/restless 0 0 0 0 2  Suicidal thoughts 0 0 0 0 0  PHQ-9 Score 5 7 4 9 14   Difficult doing work/chores - Somewhat difficult - - Very difficult  Some recent data might be  hidden    phq 9 is positive   Fall Risk: Fall Risk  07/08/2021 03/20/2021 11/20/2020 10/15/2020 08/30/2020  Falls in the past year? 0 0 0 0 0  Number falls in past yr: 0 0 0 0 0  Injury with Fall? 0 0 0 0 0  Risk for fall due to : No Fall Risks - - - -  Risk for fall due to: Comment - - - - -  Follow up Falls prevention discussed Falls prevention discussed - - -     Functional Status Survey: Is the patient deaf or have difficulty hearing?: No Does the patient have difficulty seeing, even when wearing glasses/contacts?: No Does the patient have difficulty concentrating, remembering, or making decisions?: No Does the patient have difficulty walking or climbing stairs?: No Does the patient have difficulty dressing or bathing?: No Does the patient have difficulty doing errands alone such as visiting a doctor's office or shopping?: No    Assessment & Plan  1. Hypothyroidism (acquired)  - TSH  2. Paroxysmal atrial fibrillation (HCC)  Rate controlled   3. Severe protein-energy malnutrition (HCC)   4. Atherosclerosis of aorta (HCC)   5. Moderate episode of recurrent major depressive disorder (HCC)   6. Vitamin D deficiency  We will change to liquid form  7. Pancytopenia (HCC)  She has follow up with hematologist next month   8. Anxiety  Stable   9. Avoidant-restrictive food intake disorder (ARFID)

## 2021-07-09 ENCOUNTER — Other Ambulatory Visit: Payer: Self-pay | Admitting: Family Medicine

## 2021-07-09 DIAGNOSIS — I10 Essential (primary) hypertension: Secondary | ICD-10-CM | POA: Diagnosis not present

## 2021-07-09 DIAGNOSIS — I890 Lymphedema, not elsewhere classified: Secondary | ICD-10-CM | POA: Diagnosis not present

## 2021-07-09 DIAGNOSIS — F330 Major depressive disorder, recurrent, mild: Secondary | ICD-10-CM | POA: Diagnosis not present

## 2021-07-09 DIAGNOSIS — I8393 Asymptomatic varicose veins of bilateral lower extremities: Secondary | ICD-10-CM | POA: Diagnosis not present

## 2021-07-09 DIAGNOSIS — E039 Hypothyroidism, unspecified: Secondary | ICD-10-CM

## 2021-07-09 DIAGNOSIS — I440 Atrioventricular block, first degree: Secondary | ICD-10-CM | POA: Diagnosis not present

## 2021-07-09 DIAGNOSIS — I480 Paroxysmal atrial fibrillation: Secondary | ICD-10-CM | POA: Diagnosis not present

## 2021-07-09 DIAGNOSIS — D61818 Other pancytopenia: Secondary | ICD-10-CM | POA: Diagnosis not present

## 2021-07-09 DIAGNOSIS — K2100 Gastro-esophageal reflux disease with esophagitis, without bleeding: Secondary | ICD-10-CM | POA: Diagnosis not present

## 2021-07-09 DIAGNOSIS — I700 Atherosclerosis of aorta: Secondary | ICD-10-CM | POA: Diagnosis not present

## 2021-07-09 LAB — COMPLETE METABOLIC PANEL WITH GFR
AG Ratio: 1.3 (calc) (ref 1.0–2.5)
ALT: 11 U/L (ref 6–29)
AST: 20 U/L (ref 10–35)
Albumin: 4.1 g/dL (ref 3.6–5.1)
Alkaline phosphatase (APISO): 54 U/L (ref 37–153)
BUN/Creatinine Ratio: 13 (calc) (ref 6–22)
BUN: 7 mg/dL (ref 7–25)
CO2: 33 mmol/L — ABNORMAL HIGH (ref 20–32)
Calcium: 9.2 mg/dL (ref 8.6–10.4)
Chloride: 99 mmol/L (ref 98–110)
Creat: 0.55 mg/dL — ABNORMAL LOW (ref 0.60–1.00)
Globulin: 3.2 g/dL (calc) (ref 1.9–3.7)
Glucose, Bld: 87 mg/dL (ref 65–99)
Potassium: 5.1 mmol/L (ref 3.5–5.3)
Sodium: 137 mmol/L (ref 135–146)
Total Bilirubin: 0.4 mg/dL (ref 0.2–1.2)
Total Protein: 7.3 g/dL (ref 6.1–8.1)
eGFR: 96 mL/min/{1.73_m2} (ref >=60)

## 2021-07-09 LAB — TSH: TSH: 13.85 mIU/L — ABNORMAL HIGH (ref 0.40–4.50)

## 2021-07-09 LAB — VITAMIN B12: Vitamin B-12: 844 pg/mL (ref 200–1100)

## 2021-07-09 LAB — VITAMIN D 25 HYDROXY (VIT D DEFICIENCY, FRACTURES): Vit D, 25-Hydroxy: 16 ng/mL — ABNORMAL LOW (ref 30–100)

## 2021-07-09 MED ORDER — LEVOTHYROXINE SODIUM 112 MCG PO TABS: 112.0000 ug | ORAL_TABLET | Freq: Every day | ORAL | 0 refills | Status: DC

## 2021-07-10 ENCOUNTER — Telehealth: Payer: Self-pay

## 2021-07-10 DIAGNOSIS — I700 Atherosclerosis of aorta: Secondary | ICD-10-CM | POA: Diagnosis not present

## 2021-07-10 DIAGNOSIS — K2100 Gastro-esophageal reflux disease with esophagitis, without bleeding: Secondary | ICD-10-CM | POA: Diagnosis not present

## 2021-07-10 DIAGNOSIS — F330 Major depressive disorder, recurrent, mild: Secondary | ICD-10-CM | POA: Diagnosis not present

## 2021-07-10 DIAGNOSIS — D61818 Other pancytopenia: Secondary | ICD-10-CM | POA: Diagnosis not present

## 2021-07-10 DIAGNOSIS — I8393 Asymptomatic varicose veins of bilateral lower extremities: Secondary | ICD-10-CM | POA: Diagnosis not present

## 2021-07-10 DIAGNOSIS — I480 Paroxysmal atrial fibrillation: Secondary | ICD-10-CM | POA: Diagnosis not present

## 2021-07-10 DIAGNOSIS — I440 Atrioventricular block, first degree: Secondary | ICD-10-CM | POA: Diagnosis not present

## 2021-07-10 DIAGNOSIS — I10 Essential (primary) hypertension: Secondary | ICD-10-CM | POA: Diagnosis not present

## 2021-07-10 DIAGNOSIS — I890 Lymphedema, not elsewhere classified: Secondary | ICD-10-CM | POA: Diagnosis not present

## 2021-07-10 NOTE — Telephone Encounter (Signed)
Copied from CRM [id number]. Topic: General - Other >> Jul 10, 2021 10:01 AM Marylen Ponto wrote: Reason for CRM: Donald Siva with [facility] called to see if provider would like to schedule peer to peer for determination. Per Ricio they need a call back by 3 pm today to schedule peer to peer for tomorrow by 12 pm. Cb# [phone] Option# 1

## 2021-07-12 NOTE — Telephone Encounter (Signed)
Called to schedule. Left voicemail. Awaiting confirmation.

## 2021-07-12 NOTE — Telephone Encounter (Signed)
[place], from [facility], calling to return call to [place]. She staets that the peer to peer deadline has expired and that the authorization has been denied with letters sent out. She states that the pt can appeal if she sees fit. Please advise.

## 2021-07-29 NOTE — Progress Notes (Deleted)
[place]  Telephone:(336) [phone] Fax:(336) [phone]  ID: [name] OB: 02-15-1946  MR#: [id number]  KRC#:[id number]  Patient Care Team: Steele Sizer, MD as PCP - General (Family Medicine) Anell Barr, OD as Consulting Physician (Optometry) Dew, Erskine Squibb, MD as Consulting Physician (Vascular Surgery) Lloyd Huger, MD as Consulting Physician (Oncology) Gae Dry, MD as Referring Physician (Obstetrics and Gynecology) Norman Clay, MD as Consulting Physician (Psychiatry) Vern Claude, LCSW as Social Worker   CHIEF COMPLAINT: Neutropenia.  INTERVAL HISTORY: Patient and her son agreed to video enabled telemedicine visit for further evaluation and discussion of her laboratory results.  Due to technical difficulties, visit was converted to telephone only.  Patient continues to have poor caloric intake and weight loss.  She has no neurologic complaints.  She denies any recent fevers or illnesses.  She has no chest pain, shortness of breath, cough, or hemoptysis.  She denies any nausea, vomiting, constipation, or diarrhea.  She has no urinary complaints.  Patient offers no further specific complaints today.  REVIEW OF SYSTEMS:   Review of Systems  Constitutional: Negative.  Negative for fever, malaise/fatigue and weight loss.  Respiratory: Negative.  Negative for cough, hemoptysis and shortness of breath.   Cardiovascular: Negative.  Negative for chest pain and leg swelling.  Gastrointestinal: Negative.  Negative for abdominal pain, nausea and vomiting.  Genitourinary: Negative.  Negative for dysuria.  Musculoskeletal: Negative.  Negative for back pain.  Skin: Negative.  Negative for rash.  Neurological: Negative.  Negative for dizziness, focal weakness, weakness and headaches.  Psychiatric/Behavioral: Negative.  The patient is not nervous/anxious.    As per HPI. Otherwise, a complete review of systems is negative.  PAST MEDICAL  HISTORY: Past Medical History:  Diagnosis Date   Allergy    Corn of toe    Depression    First degree AV block    GERD (gastroesophageal reflux disease)    Hammer toe of right foot    Hyperglycemia    Hyperlipidemia    Hypertension    Loss of appetite    Prolapse of urethra    Scoliosis (and kyphoscoliosis), idiopathic    Thyroid disease    Tinnitus of both ears    Vitreous floaters of right eye     PAST SURGICAL HISTORY: Past Surgical History:  Procedure Laterality Date   ABDOMINAL HYSTERECTOMY  1989   APPENDECTOMY  1989   BREAST EXCISIONAL BIOPSY Bilateral    multiple biopsies negative   BREAST SURGERY     Several   COLONOSCOPY WITH PROPOFOL N/A 07/28/2019   Procedure: COLONOSCOPY WITH PROPOFOL;  Surgeon: Lin Landsman, MD;  Location: [facility] ENDOSCOPY;  Service: Gastroenterology;  Laterality: N/A;   ESOPHAGOGASTRODUODENOSCOPY (EGD) WITH PROPOFOL N/A 11/11/2019   Procedure: ESOPHAGOGASTRODUODENOSCOPY (EGD) WITH PROPOFOL;  Surgeon: Lin Landsman, MD;  Location: [facility] ENDOSCOPY;  Service: Gastroenterology;  Laterality: N/A;   TUMOR EXCISION  1989   same time as hysterectomy    FAMILY HISTORY: Family History  Problem Relation Age of Onset   Diabetes Father 30       DM complications   Hypertension Mother 31       CKD   Thyroid disease Mother    Heart failure Mother    Kidney disease Mother    Depression Mother    Breast cancer Sister 50   Depression Sister    Kidney disease Other    Breast cancer Maternal Aunt    Breast cancer Cousin  Anuerysm Son 27   Diabetes Son     ADVANCED DIRECTIVES (Y/N):  N  HEALTH MAINTENANCE: Social History   Tobacco Use   Smoking status: Never   Smokeless tobacco: Never   Tobacco comments:    smoking cessation materials not required  Vaping Use   Vaping Use: Never used  Substance Use Topics   Alcohol use: Never    Alcohol/week: 0.0 standard drinks   Drug use: No     Colonoscopy:  PAP:  Bone density:  Lipid  panel:  Allergies  Allergen Reactions   Aspirin     Tinnitus   Citalopram Other (See Comments)   Codeine Nausea And Vomiting   Penicillins Rash    Has patient had a PCN reaction causing immediate rash, facial/tongue/throat swelling, SOB or lightheadedness with hypotension: Yes Has patient had a PCN reaction causing severe rash involving mucus membranes or skin necrosis: No Has patient had a PCN reaction that required hospitalization No Has patient had a PCN reaction occurring within the last 10 years: No If all of the above answers are "NO", then may proceed with Cephalosporin use.    Current Outpatient Medications  Medication Sig Dispense Refill   Cholecalciferol (VITAMIN D-3) 5000 UNIT/ML LIQD Place 1 mL under the tongue every other day. 30 mL 5   Ensure (ENSURE) Take 237 mLs by mouth. Pt drinking approx 3 ensures per day     levothyroxine (SYNTHROID) 112 MCG tablet Take 1 tablet (112 mcg total) by mouth daily. 90 tablet 0   omeprazole (PRILOSEC) 40 MG capsule Take 1 capsule (40 mg total) by mouth daily before breakfast. 90 capsule 3   No current facility-administered medications for this visit.    OBJECTIVE: There were no vitals filed for this visit.   There is no height or weight on file to calculate BMI.    ECOG FS:1 - Symptomatic but completely ambulatory  General: Well-developed, well-nourished, no acute distress. HEENT: Normocephalic. Neuro: Alert, answering all questions appropriately. Cranial nerves grossly intact. Psych: Normal affect.   LAB RESULTS:  Lab Results  Component Value Date   NA 137 07/08/2021   K 5.1 07/08/2021   CL 99 07/08/2021   CO2 33 (H) 07/08/2021   GLUCOSE 87 07/08/2021   BUN 7 07/08/2021   CREATININE 0.55 (L) 07/08/2021   CALCIUM 9.2 07/08/2021   PROT 7.3 07/08/2021   ALBUMIN 3.8 09/11/2020   AST 20 07/08/2021   ALT 11 07/08/2021   ALKPHOS 69 09/11/2020   BILITOT 0.4 07/08/2021   GFRNONAA 88 03/20/2021   GFRAA 102 03/20/2021     Lab Results  Component Value Date   WBC 3.0 (L) 01/25/2021   NEUTROABS 1.1 (L) 01/25/2021   HGB 11.4 (L) 01/25/2021   HCT 33.6 (L) 01/25/2021   MCV 90.6 01/25/2021   PLT 157 01/25/2021     STUDIES: No results found.  ASSESSMENT: Neutropenia  PLAN:    1. Neutropenia: Chronic and unchanged.  Suspect this may continue to be nutritional given her ongoing poor p.o. intake and weight loss.  All of her other laboratory work including folate, B12, iron stores, LDH, peripheral blood flow cytometry, and neutrophil antibodies are either negative or within normal limits. Intelli[employer] Myeloid panel was not drawn and will draw with next laboratory encounter.  No intervention is needed at this time.  Patient does not require bone marrow biopsy.  Return to clinic in 6 months with repeat laboratory work and video assisted telemedicine visit.  2.  Weight loss:  Chronic and unchanged.  Patient had CT of the chest and neck in January 2021 that did not reveal any distinct pathology.  Patient has also had endoscopic evaluation by GI that was reported as unrevealing.  She has instructed to continue her follow-up with GI as scheduled.  Continue encourage increased caloric intake.       Patient expressed understanding and was in agreement with this plan. She also understands that She can call clinic at any time with any questions, concerns, or complaints.    Lloyd Huger, MD   07/29/2021 11:27 AM

## 2021-08-01 ENCOUNTER — Inpatient Hospital Stay: Payer: Medicare HMO | Admitting: Oncology

## 2021-08-01 ENCOUNTER — Inpatient Hospital Stay: Payer: Medicare HMO

## 2021-08-01 DIAGNOSIS — D709 Neutropenia, unspecified: Secondary | ICD-10-CM

## 2021-08-12 NOTE — Progress Notes (Deleted)
[place]  Telephone:(336) [phone] Fax:(336) [phone]  ID: Maureen [name] OB: 1946-01-22  MR#: [id number]  HUT#:[id number]  Patient Care Team: Steele Sizer, MD as PCP - General (Family Medicine) Anell Barr, OD as Consulting Physician (Optometry) Dew, Erskine Squibb, MD as Consulting Physician (Vascular Surgery) Lloyd Huger, MD as Consulting Physician (Oncology) Gae Dry, MD as Referring Physician (Obstetrics and Gynecology) Norman Clay, MD as Consulting Physician (Psychiatry) Vern Claude, LCSW as Social Worker   CHIEF COMPLAINT: Neutropenia.  INTERVAL HISTORY: Patient and her son agreed to video enabled telemedicine visit for further evaluation and discussion of her laboratory results.  Due to technical difficulties, visit was converted to telephone only.  Patient continues to have poor caloric intake and weight loss.  She has no neurologic complaints.  She denies any recent fevers or illnesses.  She has no chest pain, shortness of breath, cough, or hemoptysis.  She denies any nausea, vomiting, constipation, or diarrhea.  She has no urinary complaints.  Patient offers no further specific complaints today.  REVIEW OF SYSTEMS:   Review of Systems  Constitutional: Negative.  Negative for fever, malaise/fatigue and weight loss.  Respiratory: Negative.  Negative for cough, hemoptysis and shortness of breath.   Cardiovascular: Negative.  Negative for chest pain and leg swelling.  Gastrointestinal: Negative.  Negative for abdominal pain, nausea and vomiting.  Genitourinary: Negative.  Negative for dysuria.  Musculoskeletal: Negative.  Negative for back pain.  Skin: Negative.  Negative for rash.  Neurological: Negative.  Negative for dizziness, focal weakness, weakness and headaches.  Psychiatric/Behavioral: Negative.  The patient is not nervous/anxious.    As per HPI. Otherwise, a complete review of systems is negative.  PAST MEDICAL  HISTORY: Past Medical History:  Diagnosis Date   Allergy    Corn of toe    Depression    First degree AV block    GERD (gastroesophageal reflux disease)    Hammer toe of right foot    Hyperglycemia    Hyperlipidemia    Hypertension    Loss of appetite    Prolapse of urethra    Scoliosis (and kyphoscoliosis), idiopathic    Thyroid disease    Tinnitus of both ears    Vitreous floaters of right eye     PAST SURGICAL HISTORY: Past Surgical History:  Procedure Laterality Date   ABDOMINAL HYSTERECTOMY  1989   APPENDECTOMY  1989   BREAST EXCISIONAL BIOPSY Bilateral    multiple biopsies negative   BREAST SURGERY     Several   COLONOSCOPY WITH PROPOFOL N/A 07/28/2019   Procedure: COLONOSCOPY WITH PROPOFOL;  Surgeon: Lin Landsman, MD;  Location: [facility] ENDOSCOPY;  Service: Gastroenterology;  Laterality: N/A;   ESOPHAGOGASTRODUODENOSCOPY (EGD) WITH PROPOFOL N/A 11/11/2019   Procedure: ESOPHAGOGASTRODUODENOSCOPY (EGD) WITH PROPOFOL;  Surgeon: Lin Landsman, MD;  Location: [facility] ENDOSCOPY;  Service: Gastroenterology;  Laterality: N/A;   TUMOR EXCISION  1989   same time as hysterectomy    FAMILY HISTORY: Family History  Problem Relation Age of Onset   Diabetes Father 57       DM complications   Hypertension Mother 49       CKD   Thyroid disease Mother    Heart failure Mother    Kidney disease Mother    Depression Mother    Breast cancer Sister 31   Depression Sister    Kidney disease Other    Breast cancer Maternal Aunt    Breast cancer Cousin  Anuerysm Son 19   Diabetes Son     ADVANCED DIRECTIVES (Y/N):  N  HEALTH MAINTENANCE: Social History   Tobacco Use   Smoking status: Never   Smokeless tobacco: Never   Tobacco comments:    smoking cessation materials not required  Vaping Use   Vaping Use: Never used  Substance Use Topics   Alcohol use: Never    Alcohol/week: 0.0 standard drinks   Drug use: No     Colonoscopy:  PAP:  Bone density:  Lipid  panel:  Allergies  Allergen Reactions   Aspirin     Tinnitus   Citalopram Other (See Comments)   Codeine Nausea And Vomiting   Penicillins Rash    Has patient had a PCN reaction causing immediate rash, facial/tongue/throat swelling, SOB or lightheadedness with hypotension: Yes Has patient had a PCN reaction causing severe rash involving mucus membranes or skin necrosis: No Has patient had a PCN reaction that required hospitalization No Has patient had a PCN reaction occurring within the last 10 years: No If all of the above answers are "NO", then may proceed with Cephalosporin use.    Current Outpatient Medications  Medication Sig Dispense Refill   Cholecalciferol (VITAMIN D-3) 5000 UNIT/ML LIQD Place 1 mL under the tongue every other day. 30 mL 5   Ensure (ENSURE) Take 237 mLs by mouth. Pt drinking approx 3 ensures per day     levothyroxine (SYNTHROID) 112 MCG tablet Take 1 tablet (112 mcg total) by mouth daily. 90 tablet 0   omeprazole (PRILOSEC) 40 MG capsule Take 1 capsule (40 mg total) by mouth daily before breakfast. 90 capsule 3   No current facility-administered medications for this visit.    OBJECTIVE: There were no vitals filed for this visit.   There is no height or weight on file to calculate BMI.    ECOG FS:1 - Symptomatic but completely ambulatory  General: Well-developed, well-nourished, no acute distress. HEENT: Normocephalic. Neuro: Alert, answering all questions appropriately. Cranial nerves grossly intact. Psych: Normal affect.   LAB RESULTS:  Lab Results  Component Value Date   NA 137 07/08/2021   K 5.1 07/08/2021   CL 99 07/08/2021   CO2 33 (H) 07/08/2021   GLUCOSE 87 07/08/2021   BUN 7 07/08/2021   CREATININE 0.55 (L) 07/08/2021   CALCIUM 9.2 07/08/2021   PROT 7.3 07/08/2021   ALBUMIN 3.8 09/11/2020   AST 20 07/08/2021   ALT 11 07/08/2021   ALKPHOS 69 09/11/2020   BILITOT 0.4 07/08/2021   GFRNONAA 88 03/20/2021   GFRAA 102 03/20/2021     Lab Results  Component Value Date   WBC 3.0 (L) 01/25/2021   NEUTROABS 1.1 (L) 01/25/2021   HGB 11.4 (L) 01/25/2021   HCT 33.6 (L) 01/25/2021   MCV 90.6 01/25/2021   PLT 157 01/25/2021     STUDIES: No results found.  ASSESSMENT: Neutropenia  PLAN:    1. Neutropenia: Chronic and unchanged.  Suspect this may continue to be nutritional given her ongoing poor p.o. intake and weight loss.  All of her other laboratory work including folate, B12, iron stores, LDH, peripheral blood flow cytometry, and neutrophil antibodies are either negative or within normal limits. IntelliGen Myeloid panel was not drawn and will draw with next laboratory encounter.  No intervention is needed at this time.  Patient does not require bone marrow biopsy.  Return to clinic in 6 months with repeat laboratory work and video assisted telemedicine visit.  2.  Weight loss:  Chronic and unchanged.  Patient had CT of the chest and neck in January 2021 that did not reveal any distinct pathology.  Patient has also had endoscopic evaluation by GI that was reported as unrevealing.  She has instructed to continue her follow-up with GI as scheduled.  Continue encourage increased caloric intake.       Patient expressed understanding and was in agreement with this plan. She also understands that She can call clinic at any time with any questions, concerns, or complaints.    Lloyd Huger, MD   08/12/2021 11:40 PM

## 2021-08-13 ENCOUNTER — Ambulatory Visit: Payer: Medicare HMO

## 2021-08-15 ENCOUNTER — Inpatient Hospital Stay: Payer: Medicare HMO | Admitting: Oncology

## 2021-08-15 ENCOUNTER — Inpatient Hospital Stay: Payer: Medicare HMO

## 2021-08-15 DIAGNOSIS — D709 Neutropenia, unspecified: Secondary | ICD-10-CM

## 2021-08-22 NOTE — Progress Notes (Signed)
[place]  Telephone:(336) [phone] Fax:(336) [phone]  ID: Maureen [name] OB: [birth year]  MR#: [id number]  XFG#:[id number]  Patient Care Team: Steele Sizer, MD as PCP - General (Family Medicine) Anell Barr, OD as Consulting Physician (Optometry) Dew, Erskine Squibb, MD as Consulting Physician (Vascular Surgery) Lloyd Huger, MD as Consulting Physician (Oncology) Gae Dry, MD as Referring Physician (Obstetrics and Gynecology) Norman Clay, MD as Consulting Physician (Psychiatry) Vern Claude, LCSW as Social Worker   CHIEF COMPLAINT: Neutropenia.  INTERVAL HISTORY: Patient returns to clinic today for repeat laboratory work and routine 26-monthevaluation.  She currently feels well and is asymptomatic.  Her appetite has improved and her weight has stabilized.  She has no neurologic complaints.  She denies any recent fevers or illnesses.  She has no chest pain, shortness of breath, cough, or hemoptysis.  She denies any nausea, vomiting, constipation, or diarrhea.  She has no urinary complaints.  Patient offers no specific complaints today.  REVIEW OF SYSTEMS:   Review of Systems  Constitutional: Negative.  Negative for fever, malaise/fatigue and weight loss.  Respiratory: Negative.  Negative for cough, hemoptysis and shortness of breath.   Cardiovascular: Negative.  Negative for chest pain and leg swelling.  Gastrointestinal: Negative.  Negative for abdominal pain, nausea and vomiting.  Genitourinary: Negative.  Negative for dysuria.  Musculoskeletal: Negative.  Negative for back pain.  Skin: Negative.  Negative for rash.  Neurological: Negative.  Negative for dizziness, focal weakness, weakness and headaches.  Psychiatric/Behavioral: Negative.  The patient is not nervous/anxious.    As per HPI. Otherwise, a complete review of systems is negative.  PAST MEDICAL HISTORY: Past Medical History:  Diagnosis Date   Allergy    Corn of toe     Depression    First degree AV block    GERD (gastroesophageal reflux disease)    Hammer toe of right foot    Hyperglycemia    Hyperlipidemia    Hypertension    Loss of appetite    Prolapse of urethra    Scoliosis (and kyphoscoliosis), idiopathic    Thyroid disease    Tinnitus of both ears    Vitreous floaters of right eye     PAST SURGICAL HISTORY: Past Surgical History:  Procedure Laterality Date   ABDOMINAL HYSTERECTOMY  1989   APPENDECTOMY  1989   BREAST EXCISIONAL BIOPSY Bilateral    multiple biopsies negative   BREAST SURGERY     Several   COLONOSCOPY WITH PROPOFOL N/A 07/28/2019   Procedure: COLONOSCOPY WITH PROPOFOL;  Surgeon: VLin Landsman MD;  Location: [facility] ENDOSCOPY;  Service: Gastroenterology;  Laterality: N/A;   ESOPHAGOGASTRODUODENOSCOPY (EGD) WITH PROPOFOL N/A 11/11/2019   Procedure: ESOPHAGOGASTRODUODENOSCOPY (EGD) WITH PROPOFOL;  Surgeon: VLin Landsman MD;  [facility];  Service: Gastroenterology;  Laterality: N/A;   TUMOR EXCISION  1989   same time as hysterectomy    FAMILY HISTORY: Family History  Problem Relation Age of Onset   Diabetes Father 740      DM complications   Hypertension Mother 773      CKD   Thyroid disease Mother    Heart failure Mother    Kidney disease Mother    Depression Mother    Breast cancer Sister 535  Depression Sister    Kidney disease Other    Breast cancer Maternal Aunt    Breast cancer Cousin    Anuerysm Son 560  Diabetes Son  ADVANCED DIRECTIVES (Y/N):  N  HEALTH MAINTENANCE: Social History   Tobacco Use   Smoking status: Never   Smokeless tobacco: Never   Tobacco comments:    smoking cessation materials not required  Vaping Use   Vaping Use: Never used  Substance Use Topics   Alcohol use: Never    Alcohol/week: 0.0 standard drinks   Drug use: No     Colonoscopy:  PAP:  Bone density:  Lipid panel:  Allergies  Allergen Reactions   Aspirin     Tinnitus   Citalopram  Other (See Comments)   Codeine Nausea And Vomiting   Penicillins Rash    Has patient had a PCN reaction causing immediate rash, facial/tongue/throat swelling, SOB or lightheadedness with hypotension: Yes Has patient had a PCN reaction causing severe rash involving mucus membranes or skin necrosis: No Has patient had a PCN reaction that required hospitalization No Has patient had a PCN reaction occurring within the last 10 years: No If all of the above answers are "NO", then may proceed with Cephalosporin use.    Current Outpatient Medications  Medication Sig Dispense Refill   Cholecalciferol (VITAMIN D-3) 5000 UNIT/ML LIQD Place 1 mL under the tongue every other day. 30 mL 5   Ensure (ENSURE) Take 237 mLs by mouth. Pt drinking approx 3 ensures per day     levothyroxine (SYNTHROID) 112 MCG tablet Take 1 tablet (112 mcg total) by mouth daily. 90 tablet 0   omeprazole (PRILOSEC) 40 MG capsule Take 1 capsule (40 mg total) by mouth daily before breakfast. 90 capsule 3   No current facility-administered medications for this visit.    OBJECTIVE: Vitals:   08/27/21 1456  BP: 129/87  Pulse: 69  Resp: 16  Temp: 97.9 F (36.6 C)     Body mass index is 14.81 kg/m.    ECOG FS:1 - Symptomatic but completely ambulatory  General: Thin, no acute distress. Eyes: Pink conjunctiva, anicteric sclera. HEENT: Normocephalic, moist mucous membranes. Lungs: No audible wheezing or coughing. Heart: Regular rate and rhythm. Abdomen: Soft, nontender, no obvious distention. Musculoskeletal: No edema, cyanosis, or clubbing. Neuro: Alert, answering all questions appropriately. Cranial nerves grossly intact. Skin: No rashes or petechiae noted. Psych: Normal affect.   LAB RESULTS:  Lab Results  Component Value Date   NA 137 07/08/2021   K 5.1 07/08/2021   CL 99 07/08/2021   CO2 33 (H) 07/08/2021   GLUCOSE 87 07/08/2021   BUN 7 07/08/2021   CREATININE 0.55 (L) 07/08/2021   CALCIUM 9.2 07/08/2021    PROT 7.3 07/08/2021   ALBUMIN 3.8 09/11/2020   AST 20 07/08/2021   ALT 11 07/08/2021   ALKPHOS 69 09/11/2020   BILITOT 0.4 07/08/2021   GFRNONAA 88 03/20/2021   GFRAA 102 03/20/2021    Lab Results  Component Value Date   WBC 2.6 (L) 08/27/2021   NEUTROABS 0.8 (L) 08/27/2021   HGB 11.7 (L) 08/27/2021   HCT 35.1 (L) 08/27/2021   MCV 90.0 08/27/2021   PLT 137 (L) 08/27/2021     STUDIES: No results found.  ASSESSMENT: Neutropenia  PLAN:    1. Neutropenia: Chronic and unchanged.  Patient's total white count is 2.6 today with an ANC of 0.8.  Previously, all of her other laboratory work including folate, B12, iron stores, LDH, peripheral blood flow cytometry, and neutrophil antibodies are either negative or within normal limits. IntelliGen Myeloid panel revealed a variant in the IDH1 gene, but the clinical significance of this is unclear.  No intervention is needed at this time.  Patient does not require bone marrow biopsy.  Return to clinic in 6 months with repeat laboratory work and further evaluation.    2.  Weight loss: Patient's weight has now stabilized and she has gained several pounds in the interim.  Patient had CT of the chest and neck in January 2021 that did not reveal any distinct pathology.  Patient has also had endoscopic evaluation by GI that was reported as unrevealing.  She has instructed to continue her follow-up with GI as scheduled.  Continue encourage increased caloric intake.     3.  Anemia: Mild, monitor. 4.  Thrombocytopenia: Mild, monitor.   Patient expressed understanding and was in agreement with this plan. She also understands that She can call clinic at any time with any questions, concerns, or complaints.    Lloyd Huger, MD   08/29/2021 9:07 AM

## 2021-08-27 ENCOUNTER — Inpatient Hospital Stay: Payer: Medicare HMO | Attending: Oncology

## 2021-08-27 ENCOUNTER — Other Ambulatory Visit: Payer: Self-pay

## 2021-08-27 ENCOUNTER — Inpatient Hospital Stay (HOSPITAL_BASED_OUTPATIENT_CLINIC_OR_DEPARTMENT_OTHER): Payer: Medicare HMO | Admitting: Oncology

## 2021-08-27 VITALS — BP 129/87 | HR 69 | Temp 97.9°F | Resp 16 | Wt 78.4 lb

## 2021-08-27 DIAGNOSIS — E079 Disorder of thyroid, unspecified: Secondary | ICD-10-CM | POA: Insufficient documentation

## 2021-08-27 DIAGNOSIS — D696 Thrombocytopenia, unspecified: Secondary | ICD-10-CM | POA: Diagnosis not present

## 2021-08-27 DIAGNOSIS — I129 Hypertensive chronic kidney disease with stage 1 through stage 4 chronic kidney disease, or unspecified chronic kidney disease: Secondary | ICD-10-CM | POA: Insufficient documentation

## 2021-08-27 DIAGNOSIS — D649 Anemia, unspecified: Secondary | ICD-10-CM | POA: Diagnosis not present

## 2021-08-27 DIAGNOSIS — D709 Neutropenia, unspecified: Secondary | ICD-10-CM

## 2021-08-27 DIAGNOSIS — R630 Anorexia: Secondary | ICD-10-CM | POA: Diagnosis not present

## 2021-08-27 DIAGNOSIS — K219 Gastro-esophageal reflux disease without esophagitis: Secondary | ICD-10-CM | POA: Insufficient documentation

## 2021-08-27 DIAGNOSIS — E785 Hyperlipidemia, unspecified: Secondary | ICD-10-CM | POA: Insufficient documentation

## 2021-08-27 DIAGNOSIS — R634 Abnormal weight loss: Secondary | ICD-10-CM | POA: Insufficient documentation

## 2021-08-27 LAB — CBC WITH DIFFERENTIAL/PLATELET
Abs Immature Granulocytes: 0 10*3/uL (ref 0.00–0.07)
Basophils Absolute: 0 10*3/uL (ref 0.0–0.1)
Basophils Relative: 1 %
Eosinophils Absolute: 0.1 10*3/uL (ref 0.0–0.5)
Eosinophils Relative: 3 %
HCT: 35.1 % — ABNORMAL LOW (ref 36.0–46.0)
Hemoglobin: 11.7 g/dL — ABNORMAL LOW (ref 12.0–15.0)
Immature Granulocytes: 0 %
Lymphocytes Relative: 53 %
Lymphs Abs: 1.4 10*3/uL (ref 0.7–4.0)
MCH: 30 pg (ref 26.0–34.0)
MCHC: 33.3 g/dL (ref 30.0–36.0)
MCV: 90 fL (ref 80.0–100.0)
Monocytes Absolute: 0.3 10*3/uL (ref 0.1–1.0)
Monocytes Relative: 11 %
Neutro Abs: 0.8 10*3/uL — ABNORMAL LOW (ref 1.7–7.7)
Neutrophils Relative %: 32 %
Platelets: 137 10*3/uL — ABNORMAL LOW (ref 150–400)
RBC: 3.9 MIL/uL (ref 3.87–5.11)
RDW: 15.5 % (ref 11.5–15.5)
WBC: 2.6 10*3/uL — ABNORMAL LOW (ref 4.0–10.5)
nRBC: 0 % (ref 0.0–0.2)

## 2021-08-27 NOTE — Progress Notes (Signed)
Patient denies new problems/concerns today.   °

## 2021-09-17 ENCOUNTER — Ambulatory Visit (INDEPENDENT_AMBULATORY_CARE_PROVIDER_SITE_OTHER): Payer: Medicare HMO

## 2021-09-17 DIAGNOSIS — Z0000 Encounter for general adult medical examination without abnormal findings: Secondary | ICD-10-CM

## 2021-09-17 NOTE — Progress Notes (Signed)
Subjective:   Maureen [name] is a 75 y.o. female who presents for Medicare Annual (Subsequent) preventive examination.  Virtual Visit via Video Note  I connected with  [name] on 09/17/21 at  2:50 PM EST via telehealth video enabled device and verified that I am speaking with the correct person using two identifiers.  Location: Patient: home Provider: [place] Persons participating in the virtual visit: patient & her son [name] Luella Cook Health Advisor   I discussed the limitations, risks, security and privacy concerns of performing an evaluation and management service by telephone and the availability of in person appointments. The patient expressed understanding and agreed to proceed.  Some vital signs may be absent or patient reported.   Clemetine Marker, LPN   Review of Systems     Cardiac Risk Factors include: advanced age (>82men, >38 women);sedentary lifestyle     Objective:    There were no vitals filed for this visit. There is no height or weight on file to calculate BMI.  Advanced Directives 09/17/2021 01/25/2021 09/12/2020 08/09/2020 07/17/2020 04/30/2020 04/30/2020  Does Patient Have a Medical Advance Directive? No No No No Yes No No  Type of Advance Directive - - - - [place] - -  Does patient want to make changes to medical advance directive? Yes (MAU/Ambulatory/Procedural Areas - Information given) - - - No - Patient declined - -  Copy of [place] in Chart? - - - - No - copy requested - -  Would patient like information on creating a medical advance directive? - No - Patient declined No - Patient declined Yes (MAU/Ambulatory/Procedural Areas - Information given) - - -    Current Medications (verified) Outpatient Encounter Medications as of 09/17/2021  Medication Sig   Ensure (ENSURE) Take 237 mLs by mouth. Pt drinking approx 3 ensures per day   levothyroxine (SYNTHROID) 112 MCG tablet Take 1 tablet (112 mcg total) by mouth  daily.   omeprazole (PRILOSEC) 40 MG capsule Take 1 capsule (40 mg total) by mouth daily before breakfast.   Cholecalciferol (VITAMIN D-3) 5000 UNIT/ML LIQD Place 1 mL under the tongue every other day. (Patient not taking: Reported on 09/17/2021)   No facility-administered encounter medications on file as of 09/17/2021.    Allergies (verified) Aspirin, Citalopram, Codeine, and Penicillins   History: Past Medical History:  Diagnosis Date   Allergy    Corn of toe    Depression    First degree AV block    GERD (gastroesophageal reflux disease)    Hammer toe of right foot    Hyperglycemia    Hyperlipidemia    Hypertension    Loss of appetite    Prolapse of urethra    Scoliosis (and kyphoscoliosis), idiopathic    Thyroid disease    Tinnitus of both ears    Vitreous floaters of right eye    Past Surgical History:  Procedure Laterality Date   ABDOMINAL HYSTERECTOMY  1989   APPENDECTOMY  1989   BREAST EXCISIONAL BIOPSY Bilateral    multiple biopsies negative   BREAST SURGERY     Several   COLONOSCOPY WITH PROPOFOL N/A 07/28/2019   Procedure: COLONOSCOPY WITH PROPOFOL;  Surgeon: Lin Landsman, MD;  Location: [facility] ENDOSCOPY;  Service: Gastroenterology;  Laterality: N/A;   ESOPHAGOGASTRODUODENOSCOPY (EGD) WITH PROPOFOL N/A 11/11/2019   Procedure: ESOPHAGOGASTRODUODENOSCOPY (EGD) WITH PROPOFOL;  Surgeon: Lin Landsman, MD;  Location: [facility] ENDOSCOPY;  Service: Gastroenterology;  Laterality: N/A;   TUMOR EXCISION  1989   same time as hysterectomy   Family History  Problem Relation Age of Onset   Diabetes Father 50       DM complications   Hypertension Mother 110       CKD   Thyroid disease Mother    Heart failure Mother    Kidney disease Mother    Depression Mother    Breast cancer Sister 11   Depression Sister    Kidney disease Other    Breast cancer Maternal Aunt    Breast cancer Cousin    Anuerysm Son 3   Diabetes Son    Social History   Socioeconomic  History   Marital status: Widowed    Spouse name: Not on file   Number of children: 3   Years of education: 19   Highest education level: 12th grade  Occupational History   Occupation: Retired  Tobacco Use   Smoking status: Never   Smokeless tobacco: Never   Tobacco comments:    smoking cessation materials not required  Vaping Use   Vaping Use: Never used  Substance and Sexual Activity   Alcohol use: Never    Alcohol/week: 0.0 standard drinks   Drug use: No   Sexual activity: Not Currently    Partners: Male  Other Topics Concern   Not on file  Social History Narrative   Pt lives alone; son [name] nearby and helps almost daily   Social Determinants of Health   Financial Resource Strain: Low Risk    Difficulty of Paying Living Expenses: Not hard at all  Food Insecurity: No Food Insecurity   Worried About Charity fundraiser in the Last Year: Never true   Arboriculturist in the Last Year: Never true  Transportation Needs: No Transportation Needs   Lack of Transportation (Medical): No   Lack of Transportation (Non-Medical): No  Physical Activity: Inactive   Days of Exercise per Week: 0 days   Minutes of Exercise per Session: 0 min  Stress: No Stress Concern Present   Feeling of Stress : Not at all  Social Connections: Moderately Integrated   Frequency of Communication with Friends and Family: More than three times a week   Frequency of Social Gatherings with Friends and Family: More than three times a week   Attends Religious Services: More than 4 times per year   Active Member of [employer] or Organizations: Yes   Attends Archivist Meetings: More than 4 times per year   Marital Status: Widowed    Tobacco Counseling Counseling given: Not Answered Tobacco comments: smoking cessation materials not required   Clinical Intake:  Pre-visit preparation completed: Yes  Pain : No/denies pain     Nutritional Risks: None Diabetes: No  How often do you need  to have someone help you when you read instructions, pamphlets, or other written materials from your doctor or pharmacy?: 1 - Never    Interpreter Needed?: No  Information entered by :: Clemetine Marker LPN   Activities of Daily Living In your present state of health, do you have any difficulty performing the following activities: 09/17/2021 07/08/2021  Hearing? N N  Vision? N N  Difficulty concentrating or making decisions? N N  Walking or climbing stairs? N N  Dressing or bathing? N N  Doing errands, shopping? N N  Preparing Food and eating ? N -  Using the Toilet? N -  In the past six months, have you accidently leaked urine? N -  Do you have problems with loss of bowel control? N -  Managing your Medications? N -  Managing your Finances? N -  Housekeeping or managing your Housekeeping? N -  Some recent data might be hidden    Patient Care Team: Alba Cory, MD as PCP - General (Family Medicine) Isla Pence, OD as Consulting Physician (Optometry) Wyn Quaker, Marlow Baars, MD as Consulting Physician (Vascular Surgery) Jeralyn Ruths, MD as Consulting Physician (Oncology) Nadara Mustard, MD as Referring Physician (Obstetrics and Gynecology) Neysa Hotter, MD as Consulting Physician (Psychiatry) Wenda Overland, LCSW as Social Worker  Indicate any recent Medical Services you may have received from other than [facility] providers in the past year (date may be approximate).     Assessment:   This is a routine wellness examination for [place].  Hearing/Vision screen Hearing Screening - Comments:: Pt denies hearing difficulty Vision Screening - Comments:: [facility] screen[facility]; due for exam  Dietary issues and exercise activities discussed: Current Exercise Habits: The patient does not participate in regular exercise at present, Exercise limited by: None identified   Goals Addressed             This Visit's Progress    DIET - INCREASE WATER  INTAKE   On track    Recommend to drink at least 6-8 8oz glasses of water per day.       Depression Screen PHQ 2/9 Scores 09/17/2021 07/08/2021 03/20/2021 11/20/2020 10/15/2020 08/30/2020 08/09/2020  PHQ - 2 Score 0 2 2 2 5 6 4   PHQ- 9 Score - 5 7 4 9 14 12     Fall Risk Fall Risk  09/17/2021 07/08/2021 03/20/2021 11/20/2020 10/15/2020  Falls in the past year? 0 0 0 0 0  Number falls in past yr: 0 0 0 0 0  Injury with Fall? 0 0 0 0 0  Risk for fall due to : No Fall Risks No Fall Risks - - -  Risk for fall due to: Comment - - - - -  Follow up Falls prevention discussed Falls prevention discussed Falls prevention discussed - -    FALL RISK PREVENTION PERTAINING TO THE HOME:  Any stairs in or around the home? No  If so, are there any without handrails? No  Home free of loose throw rugs in walkways, pet beds, electrical cords, etc? Yes  Adequate lighting in your home to reduce risk of falls? Yes   ASSISTIVE DEVICES UTILIZED TO PREVENT FALLS:  Life alert? No  Use of a cane, walker or w/c? No  Grab bars in the bathroom? No  Shower chair or bench in shower? Yes  Elevated toilet seat or a handicapped toilet? No   TIMED UP AND GO:  Was the test performed? No . Telephonic visit.   Cognitive Function: Normal cognitive status assessed by direct observation by this Nurse Health Advisor. No abnormalities found.       6CIT Screen 08/09/2020 08/05/2019 05/14/2018  What Year? 0 points 0 points 0 points  What month? 0 points 0 points 0 points  What time? 0 points 0 points 0 points  Count back from 20 0 points 0 points 0 points  Months in reverse 4 points 0 points 4 points  Repeat phrase 4 points 4 points 10 points  Total Score 8 4 14     Immunizations There is no immunization history for the selected administration types on file for this patient.  TDAP status: Due, Education has been provided  regarding the importance of this vaccine. Advised may receive this vaccine at local pharmacy or  Health Dept. Aware to provide a copy of the vaccination record if obtained from local pharmacy or Health Dept. Verbalized acceptance and understanding.  Flu Vaccine status: Declined, Education has been provided regarding the importance of this vaccine but patient still declined. Advised may receive this vaccine at local pharmacy or Health Dept. Aware to provide a copy of the vaccination record if obtained from local pharmacy or Health Dept. Verbalized acceptance and understanding.  Pneumococcal vaccine status: Declined,  Education has been provided regarding the importance of this vaccine but patient still declined. Advised may receive this vaccine at local pharmacy or Health Dept. Aware to provide a copy of the vaccination record if obtained from local pharmacy or Health Dept. Verbalized acceptance and understanding.   Covid-19 vaccine status: Declined, Education has been provided regarding the importance of this vaccine but patient still declined. Advised may receive this vaccine at local pharmacy or Health Dept.or vaccine clinic. Aware to provide a copy of the vaccination record if obtained from local pharmacy or Health Dept. Verbalized acceptance and understanding.  Qualifies for Shingles Vaccine? Yes   Zostavax completed No   Shingrix Completed?: No.    Education has been provided regarding the importance of this vaccine. Patient has been advised to call insurance company to determine out of pocket expense if they have not yet received this vaccine. Advised may also receive vaccine at local pharmacy or Health Dept. Verbalized acceptance and understanding.  Screening Tests Health Maintenance  Topic Date Due   COVID-19 Vaccine (1) Never done   Pneumonia Vaccine 97+ Years old (1 - PCV) Never done   TETANUS/TDAP  Never done   Zoster Vaccines- Shingrix (1 of 2) Never done   MAMMOGRAM  02/14/2021   INFLUENZA VACCINE  01/24/2022 (Originally 05/27/2021)   DEXA SCAN  08/18/2029 (Originally 12/21/1945)    COLONOSCOPY (Pts 45-72yrs Insurance coverage will need to be confirmed)  07/27/2029   Hepatitis C Screening  Completed   HPV VACCINES  Aged Out    Health Maintenance  Health Maintenance Due  Topic Date Due   COVID-19 Vaccine (1) Never done   Pneumonia Vaccine 33+ Years old (1 - PCV) Never done   TETANUS/TDAP  Never done   Zoster Vaccines- Shingrix (1 of 2) Never done   MAMMOGRAM  02/14/2021    Colorectal cancer screening: No longer required.   Mammogram status: Completed 02/15/20. Repeat every year  Bone Density status: Ordered 10/01/20. Pt provided with contact info and advised to call to schedule appt.  Lung Cancer Screening: (Low Dose CT Chest recommended if Age 47-80 years, 30 pack-year currently smoking OR have quit w/in 15years.) does not qualify.   Additional Screening:  Hepatitis C Screening: does qualify; Completed 04/26/12  Vision Screening: Recommended annual ophthalmology exams for early detection of glaucoma and other disorders of the eye. Is the patient up to date with their annual eye exam?  No  Who is the provider or what is the name of the office in which the patient attends annual eye exams? Dr. Ellin Mayhew  Dental Screening: Recommended annual dental exams for proper oral hygiene  Community Resource Referral / Chronic Care Management: CRR required this visit?  No   CCM required this visit?  No      Plan:     I have personally reviewed and noted the following in the patient's chart:   Medical and social history Use of alcohol,  tobacco or illicit drugs  Current medications and supplements including opioid prescriptions.  Functional ability and status Nutritional status Physical activity Advanced directives List of other physicians Hospitalizations, surgeries, and ER visits in previous 12 months Vitals Screenings to include cognitive, depression, and falls Referrals and appointments  In addition, I have reviewed and discussed with patient certain  preventive protocols, quality metrics, and best practice recommendations. A written personalized care plan for preventive services as well as general preventive health recommendations were provided to patient.     Clemetine Marker, LPN   [id number]   Nurse Notes: none

## 2021-09-17 NOTE — Patient Instructions (Signed)
Maureen [name] , Thank you for taking time to come for your Medicare Wellness Visit. I appreciate your ongoing commitment to your health goals. Please review the following plan we discussed and let me know if I can assist you in the future.   Screening recommendations/referrals: Colonoscopy: no longer required Mammogram: done 02/15/20. Please call [phone] to schedule your mammogram and bone density scan Bone Density: due Recommended yearly ophthalmology/optometry visit for glaucoma screening and checkup Recommended yearly dental visit for hygiene and checkup  Vaccinations: Influenza vaccine: declined Pneumococcal vaccine: declined Tdap vaccine: due Shingles vaccine: declined   Covid-19:declined  Advanced directives: Advance directive discussed with you today. I have provided a copy for you to complete at home and have notarized. Once this is complete please bring a copy in to our office so we can scan it into your chart.   Conditions/risks identified: Recommend continuing fall prevention in the home  Next appointment: Follow up in one year for your annual wellness visit    Preventive Care 65 Years and Older, Female Preventive care refers to lifestyle choices and visits with your health care provider that can promote health and wellness. What does preventive care include? A yearly physical exam. This is also called an annual well check. Dental exams once or twice a year. Routine eye exams. Ask your health care provider how often you should have your eyes checked. Personal lifestyle choices, including: Daily care of your teeth and gums. Regular physical activity. Eating a healthy diet. Avoiding tobacco and drug use. Limiting alcohol use. Practicing safe sex. Taking low-dose aspirin every day. Taking vitamin and mineral supplements as recommended by your health care provider. What happens during an annual well check? The services and screenings done by your health care provider  during your annual well check will depend on your age, overall health, lifestyle risk factors, and family history of disease. Counseling  Your health care provider may ask you questions about your: Alcohol use. Tobacco use. Drug use. Emotional well-being. Home and relationship well-being. Sexual activity. Eating habits. History of falls. Memory and ability to understand (cognition). Work and work Astronomer. Reproductive health. Screening  You may have the following tests or measurements: Height, weight, and BMI. Blood pressure. Lipid and cholesterol levels. These may be checked every 5 years, or more frequently if you are over 18 years old. Skin check. Lung cancer screening. You may have this screening every year starting at age 47 if you have a 30-pack-year history of smoking and currently smoke or have quit within the past 15 years. Fecal occult blood test (FOBT) of the stool. You may have this test every year starting at age 64. Flexible sigmoidoscopy or colonoscopy. You may have a sigmoidoscopy every 5 years or a colonoscopy every 10 years starting at age 30. Hepatitis C blood test. Hepatitis B blood test. Sexually transmitted disease (STD) testing. Diabetes screening. This is done by checking your blood sugar (glucose) after you have not eaten for a while (fasting). You may have this done every 1-3 years. Bone density scan. This is done to screen for osteoporosis. You may have this done starting at age 35. Mammogram. This may be done every 1-2 years. Talk to your health care provider about how often you should have regular mammograms. Talk with your health care provider about your test results, treatment options, and if necessary, the need for more tests. Vaccines  Your health care provider may recommend certain vaccines, such as: Influenza vaccine. This is recommended every year. Tetanus,  diphtheria, and acellular pertussis (Tdap, Td) vaccine. You may need a Td booster every  10 years. Zoster vaccine. You may need this after age 76. Pneumococcal 13-valent conjugate (PCV13) vaccine. One dose is recommended after age 51. Pneumococcal polysaccharide (PPSV23) vaccine. One dose is recommended after age 30. Talk to your health care provider about which screenings and vaccines you need and how often you need them. This information is not intended to replace advice given to you by your health care provider. Make sure you discuss any questions you have with your health care provider. Document Released: 11/09/2015 Document Revised: 07/02/2016 Document Reviewed: 08/14/2015 Elsevier Interactive Patient Education  2017 [place] Prevention in the Home Falls can cause injuries. They can happen to people of all ages. There are many things you can do to make your home safe and to help prevent falls. What can I do on the outside of my home? Regularly fix the edges of walkways and driveways and fix any cracks. Remove anything that might make you trip as you walk through a door, such as a raised step or threshold. Trim any bushes or trees on the path to your home. Use bright outdoor lighting. Clear any walking paths of anything that might make someone trip, such as rocks or tools. Regularly check to see if handrails are loose or broken. Make sure that both sides of any steps have handrails. Any raised decks and porches should have guardrails on the edges. Have any leaves, snow, or ice cleared regularly. Use sand or salt on walking paths during winter. Clean up any spills in your garage right away. This includes oil or grease spills. What can I do in the bathroom? Use night lights. Install grab bars by the toilet and in the tub and shower. Do not use towel bars as grab bars. Use non-skid mats or decals in the tub or shower. If you need to sit down in the shower, use a plastic, non-slip stool. Keep the floor dry. Clean up any water that spills on the floor as soon as it  happens. Remove soap buildup in the tub or shower regularly. Attach bath mats securely with double-sided non-slip rug tape. Do not have throw rugs and other things on the floor that can make you trip. What can I do in the bedroom? Use night lights. Make sure that you have a light by your bed that is easy to reach. Do not use any sheets or blankets that are too big for your bed. They should not hang down onto the floor. Have a firm chair that has side arms. You can use this for support while you get dressed. Do not have throw rugs and other things on the floor that can make you trip. What can I do in the kitchen? Clean up any spills right away. Avoid walking on wet floors. Keep items that you use a lot in easy-to-reach places. If you need to reach something above you, use a strong step stool that has a grab bar. Keep electrical cords out of the way. Do not use floor polish or wax that makes floors slippery. If you must use wax, use non-skid floor wax. Do not have throw rugs and other things on the floor that can make you trip. What can I do with my stairs? Do not leave any items on the stairs. Make sure that there are handrails on both sides of the stairs and use them. Fix handrails that are broken or loose. Make  sure that handrails are as long as the stairways. Check any carpeting to make sure that it is firmly attached to the stairs. Fix any carpet that is loose or worn. Avoid having throw rugs at the top or bottom of the stairs. If you do have throw rugs, attach them to the floor with carpet tape. Make sure that you have a light switch at the top of the stairs and the bottom of the stairs. If you do not have them, ask someone to add them for you. What else can I do to help prevent falls? Wear shoes that: Do not have high heels. Have rubber bottoms. Are comfortable and fit you well. Are closed at the toe. Do not wear sandals. If you use a stepladder: Make sure that it is fully opened.  Do not climb a closed stepladder. Make sure that both sides of the stepladder are locked into place. Ask someone to hold it for you, if possible. Clearly mark and make sure that you can see: Any grab bars or handrails. First and last steps. Where the edge of each step is. Use tools that help you move around (mobility aids) if they are needed. These include: Canes. Walkers. Scooters. Crutches. Turn on the lights when you go into a dark area. Replace any light bulbs as soon as they burn out. Set up your furniture so you have a clear path. Avoid moving your furniture around. If any of your floors are uneven, fix them. If there are any pets around you, be aware of where they are. Review your medicines with your doctor. Some medicines can make you feel dizzy. This can increase your chance of falling. Ask your doctor what other things that you can do to help prevent falls. This information is not intended to replace advice given to you by your health care provider. Make sure you discuss any questions you have with your health care provider. Document Released: 08/09/2009 Document Revised: 03/20/2016 Document Reviewed: 11/17/2014 Elsevier Interactive Patient Education  2017 [employer].

## 2021-10-08 ENCOUNTER — Ambulatory Visit: Payer: Medicare HMO | Admitting: Family Medicine

## 2021-10-17 ENCOUNTER — Other Ambulatory Visit: Payer: Self-pay | Admitting: Family Medicine

## 2021-10-17 DIAGNOSIS — E039 Hypothyroidism, unspecified: Secondary | ICD-10-CM

## 2021-10-18 NOTE — Telephone Encounter (Signed)
Lvm for pt to call and confirm and verify that she received this message and if shes taking her medicine as prescribed

## 2021-10-23 ENCOUNTER — Other Ambulatory Visit: Payer: Self-pay

## 2021-10-23 ENCOUNTER — Telehealth: Payer: Self-pay

## 2021-10-23 DIAGNOSIS — E039 Hypothyroidism, unspecified: Secondary | ICD-10-CM

## 2021-10-23 NOTE — Progress Notes (Signed)
ts

## 2021-10-23 NOTE — Telephone Encounter (Signed)
Informed son that she can stop by any day from 8-12 and 2-4 to do her lab work. And to keep the appt that she already have scheduled for Jan 13. He verbalized understanding.

## 2021-10-23 NOTE — Telephone Encounter (Signed)
Copied from CRM [id number]. Topic: Appointment Scheduling - Scheduling Inquiry for Clinic >> Oct 23, 2021 12:13 PM Randol Kern wrote: Reason for CRM: Pt wants to be seen for TSH lab work, please advise Best contact:  [phone]

## 2021-11-07 NOTE — Progress Notes (Signed)
Name: Maureen [name]   MRN: [id number]    DOB: 06/14/1946   Date:11/08/2021       Progress Note  Subjective  Chief Complaint  Follow Up  HPI  Malnutrition: She has been evaluation by hematologist , gastroenterologist, psychiatrist and dietician . She continue to refuse to eat , son takes food to her house but lately eating mostly ice cream and corn bread. Discussed palliative care and she is wiling to speak to them   MDD: she was seen by Dr. Susette Racer - psychiatrist , last visit 05/2020 and has been released per patient/son's request, she does not want to take Remeron - afraid of polypharmacy. She was on clonazepam but has been off for months and has not noticed any difference in symptoms . Son states she talks about dying at times. She said she is feeling better   Hypothyroidism: she has been taking medication daily now, 88 mcg daily , she was supposed to be up to 112 mcg daily , we will change from 88 mcg today to 100 mcg and son will tell pharmacist to stop all other rx that are currently on hold . She continues to lose weight, has some dysphagia that is not secondary to thyroid, brittle nails and hair loss but likely from malnutrition    Paroxysmal Afib: she never started Eliquis, she states cardiologist called her for a visit but she did not schedule it. She denies chest pain or palpitation, she is afraid of taking medications.Son is aware of risk of strokes . Unchanged   Atherosclerosis of aorta: found on CT chest , patient is not interested on statin therapy  Pancytopenia: she has been seeing Dr. Grayland Ormond, showed pancytopenia, last labs in Nov WBC down 2.6, HCT 35.1 and platelets 137   Vitamin D deficiency: she is refusing to take liquid vitamin D  Patient Active Problem List   Diagnosis Date Noted   Hyponatremia 09/24/2020   Protein-calorie malnutrition, severe 09/13/2020   Esophageal motility disorder    Palliative care by specialist    Advanced care planning/counseling  discussion    Failure to thrive in adult 09/11/2020   Avoidant-restrictive food intake disorder (ARFID) 08/30/2020   Paroxysmal atrial fibrillation ([place]) 03/22/2020   Vitamin D deficiency 03/01/2020   Dysphagia    Arrhythmia 08/25/2019   Rectal bleeding    Low grade squamous intraepith lesion on cytologic smear cervix (lgsil) 08/04/2018   Chronic venous insufficiency 07/05/2018   Lymphedema 07/05/2018   Hypertension 04/02/2018   Swelling of limb 04/02/2018   Depression, major, recurrent, mild ([place]) 12/29/2017   Leukopenia 05/24/2017   Allergic rhinitis, seasonal 06/29/2015   Clavus 06/29/2015   1st degree AV block 06/29/2015   Hammer toe 06/29/2015   H/O: HTN (hypertension) 06/29/2015   Blood glucose elevated 06/29/2015   Floater, vitreous 06/29/2015   Bilateral tinnitus 06/29/2015   Hypothyroidism (acquired) 04/02/2015   Gastroesophageal reflux disease 04/02/2015   Hammer toe of right foot 04/02/2015   Scoliosis of thoracic spine 04/02/2015   Urethral prolapse 04/02/2015   Corn of toe 04/02/2015   Hyperlipidemia 04/02/2015   Varicose veins of both lower extremities 04/02/2015    Past Surgical History:  Procedure Laterality Date   ABDOMINAL HYSTERECTOMY  1989   APPENDECTOMY  1989   BREAST EXCISIONAL BIOPSY Bilateral    multiple biopsies negative   BREAST SURGERY     Several   COLONOSCOPY WITH PROPOFOL N/A 07/28/2019   Procedure: COLONOSCOPY WITH PROPOFOL;  Surgeon: Lin Landsman, MD;  Location: [facility] ENDOSCOPY;  Service: Gastroenterology;  Laterality: N/A;   ESOPHAGOGASTRODUODENOSCOPY (EGD) WITH PROPOFOL N/A 11/11/2019   Procedure: ESOPHAGOGASTRODUODENOSCOPY (EGD) WITH PROPOFOL;  Surgeon: Toney Reil, MD;  Location: [facility] ENDOSCOPY;  Service: Gastroenterology;  Laterality: N/A;   TUMOR EXCISION  1989   same time as hysterectomy    Family History  Problem Relation Age of Onset   Diabetes Father 47       DM complications   Hypertension Mother 83       CKD    Thyroid disease Mother    Heart failure Mother    Kidney disease Mother    Depression Mother    Breast cancer Sister 82   Depression Sister    Kidney disease Other    Breast cancer Maternal Aunt    Breast cancer Cousin    Anuerysm Son 6   Diabetes Son     Social History   Tobacco Use   Smoking status: Never   Smokeless tobacco: Never   Tobacco comments:    smoking cessation materials not required  Substance Use Topics   Alcohol use: Never    Alcohol/week: 0.0 standard drinks     Current Outpatient Medications:    Ensure (ENSURE), Take 237 mLs by mouth. Pt drinking approx 3 ensures per day, Disp: , Rfl:    levothyroxine (SYNTHROID) 112 MCG tablet, Take 1 tablet (112 mcg total) by mouth daily., Disp: 90 tablet, Rfl: 0   omeprazole (PRILOSEC) 40 MG capsule, Take 1 capsule (40 mg total) by mouth daily before breakfast., Disp: 90 capsule, Rfl: 3   Cholecalciferol (VITAMIN D-3) 5000 UNIT/ML LIQD, Place 1 mL under the tongue every other day. (Patient not taking: Reported on 11/08/2021), Disp: 30 mL, Rfl: 5  Allergies  Allergen Reactions   Aspirin     Tinnitus   Citalopram Other (See Comments)   Codeine Nausea And Vomiting   Penicillins Rash    Has patient had a PCN reaction causing immediate rash, facial/tongue/throat swelling, SOB or lightheadedness with hypotension: Yes Has patient had a PCN reaction causing severe rash involving mucus membranes or skin necrosis: No Has patient had a PCN reaction that required hospitalization No Has patient had a PCN reaction occurring within the last 10 years: No If all of the above answers are "NO", then may proceed with Cephalosporin use.    I personally reviewed active problem list, medication list, allergies, family history, social history, health maintenance with the patient/caregiver today.   ROS  Ten systems reviewed and is negative except as mentioned in HPI   Objective  Vitals:   11/08/21 1423  BP: 110/68  Pulse: 81   Resp: 16  Temp: 98 F (36.7 C)  SpO2: 96%  Weight: 76 lb (34.5 kg)  Height: 5' (1.524 m)    Body mass index is 14.84 kg/m.  Physical Exam  Constitutional: Patient appears malnourished with temporal waisting . No distress.  HEENT: head atraumatic, normocephalic, pupils equal and reactive to light, neck supple, throat within normal limits Cardiovascular: Normal rate, regular rhythm and normal heart sounds.  No murmur heard. No BLE edema. Pulmonary/Chest: Effort normal and breath sounds normal. No respiratory distress. Abdominal: Soft.  There is no tenderness. Psychiatric: Patient has a normal mood and affect. behavior is normal. Judgment and thought content normal.   Recent Results (from the past 2160 hour(s))  CBC with Differential/Platelet     Status: Abnormal   Collection Time: 08/27/21  2:40 PM  Result Value Ref Range  WBC 2.6 (L) 4.0 - 10.5 K/uL   RBC 3.90 3.87 - 5.11 MIL/uL   Hemoglobin 11.7 (L) 12.0 - 15.0 g/dL   HCT 35.1 (L) 36.0 - 46.0 %   MCV 90.0 80.0 - 100.0 fL   MCH 30.0 26.0 - 34.0 pg   MCHC 33.3 30.0 - 36.0 g/dL   RDW 15.5 11.5 - 15.5 %   Platelets 137 (L) 150 - 400 K/uL   nRBC 0.0 0.0 - 0.2 %   Neutrophils Relative % 32 %   Neutro Abs 0.8 (L) 1.7 - 7.7 K/uL   Lymphocytes Relative 53 %   Lymphs Abs 1.4 0.7 - 4.0 K/uL   Monocytes Relative 11 %   Monocytes Absolute 0.3 0.1 - 1.0 K/uL   Eosinophils Relative 3 %   Eosinophils Absolute 0.1 0.0 - 0.5 K/uL   Basophils Relative 1 %   Basophils Absolute 0.0 0.0 - 0.1 K/uL   Immature Granulocytes 0 %   Abs Immature Granulocytes 0.00 0.00 - 0.07 K/uL    Comment: Performed at [facility], [address] [place], [place] [address]    PHQ2/9: Depression screen [facility] 2/9 11/08/2021 09/17/2021 07/08/2021 03/20/2021 11/20/2020  Decreased Interest 0 0 1 1 1   Down, Depressed, Hopeless 0 0 1 1 1   PHQ - 2 Score 0 0 2 2 2   Altered sleeping 0 - 0 0 0  Tired, decreased energy 0 - 1 3 0  Change in appetite 0 - 1 1 1    Feeling bad or failure about yourself  0 - 1 1 1   Trouble concentrating 0 - 0 0 0  Moving slowly or fidgety/restless 0 - 0 0 0  Suicidal thoughts 0 - 0 0 0  PHQ-9 Score 0 - 5 7 4   Difficult doing work/chores - - - Somewhat difficult -  Some recent data might be hidden    phq 9 is negative   Fall Risk: Fall Risk  11/08/2021 09/17/2021 07/08/2021 03/20/2021 11/20/2020  Falls in the past year? 0 0 0 0 0  Number falls in past yr: 0 0 0 0 0  Injury with Fall? 0 0 0 0 0  Risk for fall due to : No Fall Risks No Fall Risks No Fall Risks - -  Risk for fall due to: Comment - - - - -  Follow up Falls prevention discussed Falls prevention discussed Falls prevention discussed Falls prevention discussed -      Functional Status Survey: Is the patient deaf or have difficulty hearing?: No Does the patient have difficulty seeing, even when wearing glasses/contacts?: No Does the patient have difficulty concentrating, remembering, or making decisions?: Yes Does the patient have difficulty walking or climbing stairs?: No Does the patient have difficulty dressing or bathing?: No Does the patient have difficulty doing errands alone such as visiting a doctor's office or shopping?: No    Assessment & Plan   1. Severe protein-energy malnutrition (HCC)  - Amb Referral to Palliative Care  2. Atherosclerosis of aorta (HCC)  Refuses statin therapy   3. Paroxysmal atrial fibrillation (HCC)  Refuses therapy   4. Hypothyroidism (acquired)  - levothyroxine (SYNTHROID) 100 MCG tablet; Take 1 tablet (100 mcg total) by mouth daily.  Dispense: 90 tablet; Refill: 0  5. Avoidant-restrictive food intake disorder (ARFID)  - Amb Referral to Palliative Care  6. Moderate episode of recurrent major depressive disorder (HCC)  Refuses therapy   7. Pancytopenia ([place])   Under the care of Dr. Grayland Ormond

## 2021-11-08 ENCOUNTER — Encounter: Payer: Self-pay | Admitting: Family Medicine

## 2021-11-08 ENCOUNTER — Ambulatory Visit (INDEPENDENT_AMBULATORY_CARE_PROVIDER_SITE_OTHER): Payer: Medicare HMO | Admitting: Family Medicine

## 2021-11-08 VITALS — BP 110/68 | HR 81 | Temp 98.0°F | Resp 16 | Ht 60.0 in | Wt 76.0 lb

## 2021-11-08 DIAGNOSIS — I480 Paroxysmal atrial fibrillation: Secondary | ICD-10-CM | POA: Diagnosis not present

## 2021-11-08 DIAGNOSIS — F331 Major depressive disorder, recurrent, moderate: Secondary | ICD-10-CM

## 2021-11-08 DIAGNOSIS — D61818 Other pancytopenia: Secondary | ICD-10-CM | POA: Diagnosis not present

## 2021-11-08 DIAGNOSIS — F5082 Avoidant/restrictive food intake disorder: Secondary | ICD-10-CM

## 2021-11-08 DIAGNOSIS — E039 Hypothyroidism, unspecified: Secondary | ICD-10-CM | POA: Diagnosis not present

## 2021-11-08 DIAGNOSIS — E43 Unspecified severe protein-calorie malnutrition: Secondary | ICD-10-CM | POA: Diagnosis not present

## 2021-11-08 DIAGNOSIS — I700 Atherosclerosis of aorta: Secondary | ICD-10-CM | POA: Diagnosis not present

## 2021-11-08 LAB — TSH: TSH: 30.01 mIU/L — ABNORMAL HIGH (ref 0.40–4.50)

## 2021-11-08 MED ORDER — LEVOTHYROXINE SODIUM 100 MCG PO TABS: 100.0000 ug | ORAL_TABLET | Freq: Every day | ORAL | 0 refills | Status: DC

## 2021-11-19 ENCOUNTER — Telehealth: Payer: Self-pay | Admitting: Nurse Practitioner

## 2021-11-19 NOTE — Telephone Encounter (Signed)
Spoke with patient's son regarding the Palliative referral/services and he was in agreement with scheduling visit.  I have scheduled an In-home Consult for 11/21/21 @ 2 PM.

## 2021-11-21 ENCOUNTER — Other Ambulatory Visit: Payer: Self-pay

## 2021-11-21 ENCOUNTER — Other Ambulatory Visit: Payer: Medicare HMO | Admitting: Nurse Practitioner

## 2021-12-04 ENCOUNTER — Other Ambulatory Visit: Payer: Self-pay

## 2021-12-04 ENCOUNTER — Other Ambulatory Visit: Payer: Medicare HMO | Admitting: Nurse Practitioner

## 2022-01-01 ENCOUNTER — Telehealth: Payer: Self-pay | Admitting: Nurse Practitioner

## 2022-01-01 ENCOUNTER — Other Ambulatory Visit: Payer: Self-pay

## 2022-01-01 ENCOUNTER — Other Ambulatory Visit: Payer: Medicare HMO | Admitting: Nurse Practitioner

## 2022-01-01 NOTE — Telephone Encounter (Signed)
I attempted to contact Maureen [name] for confirmation for [facility] initial consult, no answer, message left to recontact for rescheduling or confirm visit. Visit not made as no return phone call. This is the 3rd appointment Maureen [name] has no showed or rescheduled.  ?

## 2022-01-24 ENCOUNTER — Telehealth: Payer: Self-pay | Admitting: Nurse Practitioner

## 2022-01-24 NOTE — Telephone Encounter (Signed)
Spoke with patient's son Pricilla Holm and have rescheduled the Initial Consult for 01/28/22 @ 1 PM. ?

## 2022-01-28 ENCOUNTER — Other Ambulatory Visit: Payer: Medicare HMO | Admitting: Nurse Practitioner

## 2022-01-30 ENCOUNTER — Other Ambulatory Visit: Payer: Medicare HMO | Admitting: Nurse Practitioner

## 2022-01-30 ENCOUNTER — Telehealth: Payer: Self-pay | Admitting: Nurse Practitioner

## 2022-01-30 NOTE — Telephone Encounter (Signed)
I called to check to see if able to get on PC initial video telemedicine visit, Mr. Maureen [name] endorses he needed about 15 minutes to get to Ms. Bettes's home and settled, he was still driving. This provider has another appointment at 2pm, so will need to reschedule. Mr Maureen [name] asked to be contacted to reschedule. Will send email for palliative admit to reschedule due to being a initial consult.  ?

## 2022-02-06 ENCOUNTER — Telehealth: Payer: Medicare HMO | Admitting: Nurse Practitioner

## 2022-02-06 ENCOUNTER — Encounter: Payer: Self-pay | Admitting: Nurse Practitioner

## 2022-02-06 DIAGNOSIS — Z515 Encounter for palliative care: Secondary | ICD-10-CM | POA: Diagnosis not present

## 2022-02-06 DIAGNOSIS — R5381 Other malaise: Secondary | ICD-10-CM | POA: Diagnosis not present

## 2022-02-06 DIAGNOSIS — R630 Anorexia: Secondary | ICD-10-CM

## 2022-02-06 NOTE — Progress Notes (Addendum)
? ? ?Manufacturing engineer ?Community Palliative Care Consult Note ?Telephone: [phone]  ?Fax: [phone]  ? ?Date of encounter: 02/06/22 ?8:15 PM ?PATIENT NAME: Maureen [name] ?[address] ?[place] [address]   ?[phone] (home)  ?DOB: 07-19-1946 ?MRN: [id number] ?PRIMARY CARE PROVIDER:    ?Steele Sizer, MD,  ?[address] ?[place] [address] ?[phone] ? ?REFERRING PROVIDER:   ?Steele Sizer, MD ?[address] ?[address] ?[place],  [place] [address] ?[phone] ? ?RESPONSIBLE PARTY:    ?Contact Information   ? ? Name Relation Home Work Mobile  ? Blossom Hoops Son [phone]  [phone]  ? Wynema Birch Daughter   [phone]  ? [place] [phone]    ? Dub Amis Niece   [phone]  ? ?  ? ?Due to the COVID-19 crisis, this visit was done via telemedicine from my office and it was initiated and consent by this patient and or family. ? ?I connected with Rosanne Gutting, Ms. Heslop's son with  ANGELIA CHERUBINI [employer]OXY on 02/06/22 by a video enabled telemedicine application and verified that I am speaking with the correct person using two identifiers. ?  ?I discussed the limitations of evaluation and management by telemedicine. The patient expressed understanding and agreed to proceed. Palliative Care was asked to follow this patient by consultation request of  Steele Sizer, MD to address advance care planning and complex medical decision making. This is the initial visit.                         ?ASSESSMENT AND PLAN / RECOMMENDATIONS:  ?Symptom Management/Plan: ?1. Advance Care Planning;  ongoing discussions;  ? ?2. Anorexia secondary to AFTT; PCM, declining to eat with MDD, depression. We talked at length about nutrition, appetite, weights (no current weight). We talked about medical goals, motivation and option of Remeron. Ms. [place] and Rod in agreement to trying trial low dose Remeron. Will send in Rx.  ? ?07/08/2021 weight 79 lbs ?11/08/2021 weight  76 lbs ?BMI 14.84; height 5'1" ?Lost 43 lbs since 07/2019 ? ?Rx: Remeron 15mg  take 1/2 tablet qhs; #30; No RF escribed to [address], [place] ? ?3. Debility secondary to PCM/FTT; discussed Ms. Zarling ability to ambulate though with generalized weakness, concern for falls. Ms. Cassity does perform ADL's, takes her some time to do. Ms. Butson is able to feed herself though takes her time. We talked about motivation at length and what would stimulate Ms. Prine to be more participatory in her daily routine rather than sitting all day, quality of life. Medical goals reviewed.  ? ?4. Goals of Care: Goals include to maximize quality of life and symptom management. Our advance care planning conversation included a discussion about:    ?The value and importance of advance care planning  ?Exploration of personal, cultural or spiritual beliefs that might influence medical decisions  ?Exploration of goals of care in the event of a sudden injury or illness  ?Identification and preparation of a healthcare agent  ?Review and updating or creation of an advance directive document. ? ?5. Palliative care encounter; Palliative care encounter; Palliative medicine team will continue to support patient, patient's family, and medical team. Visit consisted of counseling and education dealing with the complex and emotionally intense issues of symptom management and palliative care in the setting of serious and potentially life-threatening illness ? ?Follow up Palliative Care Visit: Palliative care will continue to follow for complex medical decision making, advance care planning,  and clarification of goals. Return 4 weeks or prn. ? ?I spent 62 minutes providing this consultation. More than 50% of the time in this consultation was spent in counseling and care coordination. ? ?PPS: 40% ? ?Chief Complaint: Initial palliative consult for complex medical decision making ? ?HISTORY OF PRESENT ILLNESS:  Maureen [name] is a 76 y.o. year old  female  with multiple medical problems including protein calorie malnutrition, adult failure to thrive, dysphagia, atrial fib, atherosclerosis, esophageal motility disorder, pancytopenia, hypothyroidism, chronic venous insufficiency, varicouse veins BLE, lymphedema, HTN, HTN, GERD, scoliosis of thoracic spine, HLD, Depression, vitamin D deficiency, mood disorder (followed by Dr Susette Racer Psychiatristd). I connected with Rod, Maureen [name] and Ms [name] for initial pc visit. We talked about past medical history, ros, symptoms, functional abilities. We talked about barriers with motivation, appetite, weight, generalized weakness. We talked about Maureen [name] residing by herself with son Rod visiting daily brining her foods. We talked about foods she likes to eat, nutrition, supplements. We talked about her daily routine. We talked about life review, family dynamics, quality of life. We talked about role pc in poc. We talked about possible resources available. We talked about role pc in poc. We talked about f/u pc visit, scheduled. Therapeutic listening, emotional support provided. Questions answered. Will ask PC RN to make a visit with PC SW for further discussion of goc, possible resources available for home assistance, housekeeping, meals and current weight and measurements with f/u remeron. Ms. [place] and Rod in agreement.  ? ?History obtained from review of EMR, discussion with Rod, Ms. Aiken's son with Maureen [name].  ?I reviewed available labs, medications, imaging, studies and related documents from the EMR.  Records reviewed and summarized above.  ? ?ROS ?10 point system reviewed all negative except HPI ? ?Physical Exam: ?deferred ?CURRENT PROBLEM LIST:  ?Patient Active Problem List  ? Diagnosis Date Noted  ? Hyponatremia 09/24/2020  ? Protein-calorie malnutrition, severe 09/13/2020  ? Esophageal motility disorder   ? Palliative care by specialist   ? Advanced care planning/counseling discussion   ? Failure to thrive in adult  09/11/2020  ? Avoidant-restrictive food intake disorder (ARFID) 08/30/2020  ? Paroxysmal atrial fibrillation ([place]) 03/22/2020  ? Vitamin D deficiency 03/01/2020  ? Dysphagia   ? Arrhythmia 08/25/2019  ? Rectal bleeding   ? Low grade squamous intraepith lesion on cytologic smear cervix (lgsil) 08/04/2018  ? Chronic venous insufficiency 07/05/2018  ? Lymphedema 07/05/2018  ? Hypertension 04/02/2018  ? Swelling of limb 04/02/2018  ? Depression, major, recurrent, mild ([place]) 12/29/2017  ? Leukopenia 05/24/2017  ? Allergic rhinitis, seasonal 06/29/2015  ? Clavus 06/29/2015  ? 1st degree AV block 06/29/2015  ? Hammer toe 06/29/2015  ? H/O: HTN (hypertension) 06/29/2015  ? Blood glucose elevated 06/29/2015  ? Floater, vitreous 06/29/2015  ? Bilateral tinnitus 06/29/2015  ? Hypothyroidism (acquired) 04/02/2015  ? Gastroesophageal reflux disease 04/02/2015  ? Hammer toe of right foot 04/02/2015  ? Scoliosis of thoracic spine 04/02/2015  ? Urethral prolapse 04/02/2015  ? Corn of toe 04/02/2015  ? Hyperlipidemia 04/02/2015  ? Varicose veins of both lower extremities 04/02/2015  ? ?PAST MEDICAL HISTORY:  ?Active Ambulatory Problems  ?  Diagnosis Date Noted  ? Hypothyroidism (acquired) 04/02/2015  ? Gastroesophageal reflux disease 04/02/2015  ? Hammer toe of right foot 04/02/2015  ? Scoliosis of thoracic spine 04/02/2015  ? Urethral prolapse 04/02/2015  ? Corn of toe 04/02/2015  ? Hyperlipidemia 04/02/2015  ? Varicose veins of both lower extremities  04/02/2015  ? Allergic rhinitis, seasonal 06/29/2015  ? Clavus 06/29/2015  ? 1st degree AV block 06/29/2015  ? Hammer toe 06/29/2015  ? H/O: HTN (hypertension) 06/29/2015  ? Blood glucose elevated 06/29/2015  ? Floater, vitreous 06/29/2015  ? Bilateral tinnitus 06/29/2015  ? Leukopenia 05/24/2017  ? Depression, major, recurrent, mild ([place]) 12/29/2017  ? Hypertension 04/02/2018  ? Swelling of limb 04/02/2018  ? Chronic venous insufficiency 07/05/2018  ? Lymphedema 07/05/2018  ? Low  grade squamous intraepith lesion on cytologic smear cervix (lgsil) 08/04/2018  ? Rectal bleeding   ? Arrhythmia 08/25/2019  ? Dysphagia   ? Vitamin D deficiency 03/01/2020  ? Paroxysmal atrial fibrillation (HCC) 0

## 2022-02-13 ENCOUNTER — Telehealth: Payer: Self-pay

## 2022-02-13 NOTE — Telephone Encounter (Signed)
PC SW outreached patients son, Verner Chol, to schedule follow up PC visit.  ? ? ?In home visit scheduled for 5/3 @1pm  with PC RN/SW. ?

## 2022-02-13 NOTE — Progress Notes (Deleted)
Name: Maureen [name]   MRN: [id number]    DOB: 01-17-1946   Date:02/13/2022       Progress Note  Subjective  Chief Complaint  Follow Up  HPI  Malnutrition: She has been evaluation by hematologist , gastroenterologist, psychiatrist and dietician . She continue to refuse to eat , son takes food to her house but lately eating mostly ice cream and corn bread. Discussed palliative care and she is wiling to speak to them   MDD: she was seen by Dr. Susette Racer - psychiatrist , last visit 05/2020 and has been released per patient/son's request, she does not want to take Remeron - afraid of polypharmacy. She was on clonazepam but has been off for months and has not noticed any difference in symptoms . Son states she talks about dying at times. She said she is feeling better   Hypothyroidism: she has been taking medication daily now, 88 mcg daily , she was supposed to be up to 112 mcg daily , we will change from 88 mcg today to 100 mcg and son will tell pharmacist to stop all other rx that are currently on hold . She continues to lose weight, has some dysphagia that is not secondary to thyroid, brittle nails and hair loss but likely from malnutrition    Paroxysmal Afib: she never started Eliquis, she states cardiologist called her for a visit but she did not schedule it. She denies chest pain or palpitation, she is afraid of taking medications.Son is aware of risk of strokes . Unchanged   Atherosclerosis of aorta: found on CT chest , patient is not interested on statin therapy  Pancytopenia: she has been seeing Dr. Grayland Ormond, showed pancytopenia, last labs in Nov WBC down 2.6, HCT 35.1 and platelets 137   Vitamin D deficiency: she is refusing to take liquid vitamin D  Patient Active Problem List   Diagnosis Date Noted   Hyponatremia 09/24/2020   Protein-calorie malnutrition, severe 09/13/2020   Esophageal motility disorder    Palliative care by specialist    Advanced care planning/counseling  discussion    Failure to thrive in adult 09/11/2020   Avoidant-restrictive food intake disorder (ARFID) 08/30/2020   Paroxysmal atrial fibrillation ([place]) 03/22/2020   Vitamin D deficiency 03/01/2020   Dysphagia    Arrhythmia 08/25/2019   Rectal bleeding    Low grade squamous intraepith lesion on cytologic smear cervix (lgsil) 08/04/2018   Chronic venous insufficiency 07/05/2018   Lymphedema 07/05/2018   Hypertension 04/02/2018   Swelling of limb 04/02/2018   Depression, major, recurrent, mild ([place]) 12/29/2017   Leukopenia 05/24/2017   Allergic rhinitis, seasonal 06/29/2015   Clavus 06/29/2015   1st degree AV block 06/29/2015   Hammer toe 06/29/2015   H/O: HTN (hypertension) 06/29/2015   Blood glucose elevated 06/29/2015   Floater, vitreous 06/29/2015   Bilateral tinnitus 06/29/2015   Hypothyroidism (acquired) 04/02/2015   Gastroesophageal reflux disease 04/02/2015   Hammer toe of right foot 04/02/2015   Scoliosis of thoracic spine 04/02/2015   Urethral prolapse 04/02/2015   Corn of toe 04/02/2015   Hyperlipidemia 04/02/2015   Varicose veins of both lower extremities 04/02/2015    Past Surgical History:  Procedure Laterality Date   ABDOMINAL HYSTERECTOMY  1989   APPENDECTOMY  1989   BREAST EXCISIONAL BIOPSY Bilateral    multiple biopsies negative   BREAST SURGERY     Several   COLONOSCOPY WITH PROPOFOL N/A 07/28/2019   Procedure: COLONOSCOPY WITH PROPOFOL;  Surgeon: Lin Landsman, MD;  Location: [facility] ENDOSCOPY;  Service: Gastroenterology;  Laterality: N/A;   ESOPHAGOGASTRODUODENOSCOPY (EGD) WITH PROPOFOL N/A 11/11/2019   Procedure: ESOPHAGOGASTRODUODENOSCOPY (EGD) WITH PROPOFOL;  Surgeon: Lin Landsman, MD;  Location: [facility] ENDOSCOPY;  Service: Gastroenterology;  Laterality: N/A;   TUMOR EXCISION  1989   same time as hysterectomy    Family History  Problem Relation Age of Onset   Diabetes Father 35       DM complications   Hypertension Mother 33       CKD    Thyroid disease Mother    Heart failure Mother    Kidney disease Mother    Depression Mother    Breast cancer Sister 21   Depression Sister    Kidney disease Other    Breast cancer Maternal Aunt    Breast cancer Cousin    Anuerysm Son 71   Diabetes Son     Social History   Tobacco Use   Smoking status: Never   Smokeless tobacco: Never   Tobacco comments:    smoking cessation materials not required  Substance Use Topics   Alcohol use: Never    Alcohol/week: 0.0 standard drinks     Current Outpatient Medications:    Cholecalciferol (VITAMIN D-3) 5000 UNIT/ML LIQD, Place 1 mL under the tongue every other day. (Patient not taking: Reported on 11/08/2021), Disp: 30 mL, Rfl: 5   Ensure (ENSURE), Take 237 mLs by mouth. Pt drinking approx 3 ensures per day, Disp: , Rfl:    levothyroxine (SYNTHROID) 100 MCG tablet, Take 1 tablet (100 mcg total) by mouth daily., Disp: 90 tablet, Rfl: 0   omeprazole (PRILOSEC) 40 MG capsule, Take 1 capsule (40 mg total) by mouth daily before breakfast., Disp: 90 capsule, Rfl: 3  Allergies  Allergen Reactions   Aspirin     Tinnitus   Citalopram Other (See Comments)   Codeine Nausea And Vomiting   Penicillins Rash    Has patient had a PCN reaction causing immediate rash, facial/tongue/throat swelling, SOB or lightheadedness with hypotension: Yes Has patient had a PCN reaction causing severe rash involving mucus membranes or skin necrosis: No Has patient had a PCN reaction that required hospitalization No Has patient had a PCN reaction occurring within the last 10 years: No If all of the above answers are "NO", then may proceed with Cephalosporin use.    I personally reviewed active problem list, medication list, allergies, family history, social history, health maintenance with the patient/caregiver today.   ROS  ***  Objective  There were no vitals filed for this visit.  There is no height or weight on file to calculate BMI.  Physical  Exam ***  No results found for this or any previous visit (from the past 2160 hour(s)).   PHQ2/9:    11/08/2021    2:22 PM 09/17/2021    3:00 PM 07/08/2021    2:25 PM 03/20/2021   11:07 AM 11/20/2020   11:24 AM  Depression screen PHQ 2/9  Decreased Interest 0 0 1 1 1   Down, Depressed, Hopeless 0 0 1 1 1   PHQ - 2 Score 0 0 2 2 2   Altered sleeping 0  0 0 0  Tired, decreased energy 0  1 3 0  Change in appetite 0  1 1 1   Feeling bad or failure about yourself  0  1 1 1   Trouble concentrating 0  0 0 0  Moving slowly or fidgety/restless 0  0 0 0  Suicidal thoughts 0  0  0 0  PHQ-9 Score 0  5 7 4   Difficult doing work/chores    Somewhat difficult     phq 9 is {gen pos [id number]   Fall Risk:    11/08/2021    2:22 PM 09/17/2021    3:04 PM 07/08/2021    2:25 PM 03/20/2021   11:07 AM 11/20/2020   11:17 AM  Fall Risk   Falls in the past year? 0 0 0 0 0  Number falls in past yr: 0 0 0 0 0  Injury with Fall? 0 0 0 0 0  Risk for fall due to : No Fall Risks No Fall Risks No Fall Risks    Follow up Falls prevention discussed Falls prevention discussed Falls prevention discussed Falls prevention discussed       Functional Status Survey:      Assessment & Plan  *** There are no diagnoses linked to this encounter.

## 2022-02-14 ENCOUNTER — Ambulatory Visit: Payer: Medicare HMO | Admitting: Family Medicine

## 2022-02-14 NOTE — Progress Notes (Deleted)
Name: Maureen [name]   MRN: [id number]    DOB: Dec 02, 1945   Date:02/14/2022       Progress Note  Subjective  Chief Complaint  Follow Up  HPI  Malnutrition: She has been evaluation by hematologist , gastroenterologist, psychiatrist and dietician . She continue to refuse to eat , son takes food to her house but lately eating mostly ice cream and corn bread. Discussed palliative care and she is wiling to speak to them   MDD: she was seen by Dr. Susette Racer - psychiatrist , last visit 05/2020 and has been released per patient/son's request, she does not want to take Remeron - afraid of polypharmacy. She was on clonazepam but has been off for months and has not noticed any difference in symptoms . Son states she talks about dying at times. She said she is feeling better   Hypothyroidism: she has been taking medication daily now, 88 mcg daily , she was supposed to be up to 112 mcg daily , we will change from 88 mcg today to 100 mcg and son will tell pharmacist to stop all other rx that are currently on hold . She continues to lose weight, has some dysphagia that is not secondary to thyroid, brittle nails and hair loss but likely from malnutrition    Paroxysmal Afib: she never started Eliquis, she states cardiologist called her for a visit but she did not schedule it. She denies chest pain or palpitation, she is afraid of taking medications.Son is aware of risk of strokes . Unchanged   Atherosclerosis of aorta: found on CT chest , patient is not interested on statin therapy  Pancytopenia: she has been seeing Dr. Grayland Ormond, showed pancytopenia, last labs in Nov WBC down 2.6, HCT 35.1 and platelets 137   Vitamin D deficiency: she is refusing to take liquid vitamin D  Patient Active Problem List   Diagnosis Date Noted   Hyponatremia 09/24/2020   Protein-calorie malnutrition, severe 09/13/2020   Esophageal motility disorder    Palliative care by specialist    Advanced care planning/counseling  discussion    Failure to thrive in adult 09/11/2020   Avoidant-restrictive food intake disorder (ARFID) 08/30/2020   Paroxysmal atrial fibrillation ([place]) 03/22/2020   Vitamin D deficiency 03/01/2020   Dysphagia    Arrhythmia 08/25/2019   Rectal bleeding    Low grade squamous intraepith lesion on cytologic smear cervix (lgsil) 08/04/2018   Chronic venous insufficiency 07/05/2018   Lymphedema 07/05/2018   Hypertension 04/02/2018   Swelling of limb 04/02/2018   Depression, major, recurrent, mild ([place]) 12/29/2017   Leukopenia 05/24/2017   Allergic rhinitis, seasonal 06/29/2015   Clavus 06/29/2015   1st degree AV block 06/29/2015   Hammer toe 06/29/2015   H/O: HTN (hypertension) 06/29/2015   Blood glucose elevated 06/29/2015   Floater, vitreous 06/29/2015   Bilateral tinnitus 06/29/2015   Hypothyroidism (acquired) 04/02/2015   Gastroesophageal reflux disease 04/02/2015   Hammer toe of right foot 04/02/2015   Scoliosis of thoracic spine 04/02/2015   Urethral prolapse 04/02/2015   Corn of toe 04/02/2015   Hyperlipidemia 04/02/2015   Varicose veins of both lower extremities 04/02/2015    Past Surgical History:  Procedure Laterality Date   ABDOMINAL HYSTERECTOMY  1989   APPENDECTOMY  1989   BREAST EXCISIONAL BIOPSY Bilateral    multiple biopsies negative   BREAST SURGERY     Several   COLONOSCOPY WITH PROPOFOL N/A 07/28/2019   Procedure: COLONOSCOPY WITH PROPOFOL;  Surgeon: Lin Landsman, MD;  Location: [facility] ENDOSCOPY;  Service: Gastroenterology;  Laterality: N/A;   ESOPHAGOGASTRODUODENOSCOPY (EGD) WITH PROPOFOL N/A 11/11/2019   Procedure: ESOPHAGOGASTRODUODENOSCOPY (EGD) WITH PROPOFOL;  Surgeon: Lin Landsman, MD;  Location: [facility] ENDOSCOPY;  Service: Gastroenterology;  Laterality: N/A;   TUMOR EXCISION  1989   same time as hysterectomy    Family History  Problem Relation Age of Onset   Diabetes Father 59       DM complications   Hypertension Mother 75       CKD    Thyroid disease Mother    Heart failure Mother    Kidney disease Mother    Depression Mother    Breast cancer Sister 45   Depression Sister    Kidney disease Other    Breast cancer Maternal Aunt    Breast cancer Cousin    Anuerysm Son 26   Diabetes Son     Social History   Tobacco Use   Smoking status: Never   Smokeless tobacco: Never   Tobacco comments:    smoking cessation materials not required  Substance Use Topics   Alcohol use: Never    Alcohol/week: 0.0 standard drinks     Current Outpatient Medications:    Cholecalciferol (VITAMIN D-3) 5000 UNIT/ML LIQD, Place 1 mL under the tongue every other day. (Patient not taking: Reported on 11/08/2021), Disp: 30 mL, Rfl: 5   Ensure (ENSURE), Take 237 mLs by mouth. Pt drinking approx 3 ensures per day, Disp: , Rfl:    levothyroxine (SYNTHROID) 100 MCG tablet, Take 1 tablet (100 mcg total) by mouth daily., Disp: 90 tablet, Rfl: 0   omeprazole (PRILOSEC) 40 MG capsule, Take 1 capsule (40 mg total) by mouth daily before breakfast., Disp: 90 capsule, Rfl: 3  Allergies  Allergen Reactions   Aspirin     Tinnitus   Citalopram Other (See Comments)   Codeine Nausea And Vomiting   Penicillins Rash    Has patient had a PCN reaction causing immediate rash, facial/tongue/throat swelling, SOB or lightheadedness with hypotension: Yes Has patient had a PCN reaction causing severe rash involving mucus membranes or skin necrosis: No Has patient had a PCN reaction that required hospitalization No Has patient had a PCN reaction occurring within the last 10 years: No If all of the above answers are "NO", then may proceed with Cephalosporin use.    I personally reviewed active problem list, medication list, allergies, family history, social history, health maintenance with the patient/caregiver today.   ROS  ***  Objective  There were no vitals filed for this visit.  There is no height or weight on file to calculate BMI.  Physical  Exam ***  No results found for this or any previous visit (from the past 2160 hour(s)).   PHQ2/9:    11/08/2021    2:22 PM 09/17/2021    3:00 PM 07/08/2021    2:25 PM 03/20/2021   11:07 AM 11/20/2020   11:24 AM  Depression screen PHQ 2/9  Decreased Interest 0 0 1 1 1   Down, Depressed, Hopeless 0 0 1 1 1   PHQ - 2 Score 0 0 2 2 2   Altered sleeping 0  0 0 0  Tired, decreased energy 0  1 3 0  Change in appetite 0  1 1 1   Feeling bad or failure about yourself  0  1 1 1   Trouble concentrating 0  0 0 0  Moving slowly or fidgety/restless 0  0 0 0  Suicidal thoughts 0  0  0 0  PHQ-9 Score 0  5 7 4   Difficult doing work/chores    Somewhat difficult     phq 9 is {gen pos [id number]   Fall Risk:    11/08/2021    2:22 PM 09/17/2021    3:04 PM 07/08/2021    2:25 PM 03/20/2021   11:07 AM 11/20/2020   11:17 AM  Fall Risk   Falls in the past year? 0 0 0 0 0  Number falls in past yr: 0 0 0 0 0  Injury with Fall? 0 0 0 0 0  Risk for fall due to : No Fall Risks No Fall Risks No Fall Risks    Follow up Falls prevention discussed Falls prevention discussed Falls prevention discussed Falls prevention discussed       Functional Status Survey:      Assessment & Plan  *** There are no diagnoses linked to this encounter.

## 2022-02-17 ENCOUNTER — Ambulatory Visit: Payer: Medicare HMO | Admitting: Family Medicine

## 2022-02-24 ENCOUNTER — Inpatient Hospital Stay: Payer: Medicare HMO

## 2022-02-24 ENCOUNTER — Inpatient Hospital Stay: Payer: Medicare HMO | Admitting: Nurse Practitioner

## 2022-02-25 ENCOUNTER — Encounter: Payer: Self-pay | Admitting: Family Medicine

## 2022-02-25 ENCOUNTER — Other Ambulatory Visit: Payer: Medicare HMO | Admitting: Nurse Practitioner

## 2022-02-25 ENCOUNTER — Ambulatory Visit (INDEPENDENT_AMBULATORY_CARE_PROVIDER_SITE_OTHER): Payer: Medicare HMO | Admitting: Family Medicine

## 2022-02-25 VITALS — BP 90/78 | HR 52 | Temp 97.7°F | Resp 12 | Ht 60.0 in | Wt 71.9 lb

## 2022-02-25 DIAGNOSIS — E559 Vitamin D deficiency, unspecified: Secondary | ICD-10-CM | POA: Diagnosis not present

## 2022-02-25 DIAGNOSIS — F5082 Avoidant/restrictive food intake disorder: Secondary | ICD-10-CM

## 2022-02-25 DIAGNOSIS — E039 Hypothyroidism, unspecified: Secondary | ICD-10-CM | POA: Diagnosis not present

## 2022-02-25 DIAGNOSIS — I700 Atherosclerosis of aorta: Secondary | ICD-10-CM | POA: Diagnosis not present

## 2022-02-25 DIAGNOSIS — E43 Unspecified severe protein-calorie malnutrition: Secondary | ICD-10-CM

## 2022-02-25 DIAGNOSIS — I480 Paroxysmal atrial fibrillation: Secondary | ICD-10-CM | POA: Diagnosis not present

## 2022-02-25 DIAGNOSIS — D61818 Other pancytopenia: Secondary | ICD-10-CM

## 2022-02-25 DIAGNOSIS — F331 Major depressive disorder, recurrent, moderate: Secondary | ICD-10-CM | POA: Diagnosis not present

## 2022-02-25 MED ORDER — MEGESTROL ACETATE 40 MG PO TABS: 40.0000 mg | ORAL_TABLET | Freq: Every day | ORAL | 2 refills | Status: DC

## 2022-02-25 NOTE — Progress Notes (Signed)
Name: Maureen [name]   MRN: [id number]    DOB: 01-15-1946   Date:02/25/2022 ? ?     Progress Note ? ?Subjective ? ?Chief Complaint ? ?Chief Complaint  ?Patient presents with  ? Follow-up  ? ? ?HPI ? ?Malnutrition: She has been evaluation by hematologist , gastroenterologist, psychiatrist and dietician . She continue to refuse to eat ,son states she no longer wants company for when she is eating. We referred her to palliative care has an appointment with her tomorrow. She told me she eats ice cream, corn bread, apple sauce and drinks Ensure, sometimes eats mash potatoes and candied yams. Her son gets frustrated because he thinks she is not putting enough effort into getting better and gaining weight. Explained to him she has an eating disorder and now is important to love and accept her choices, discussed with patient that [facility] has eating disorder sub-specialist and also in patient care but she needs to be willing to go .   Discussed megace to see if it would help with her appetite and she would like that . I also discussed hospice care  ? ?MDD: she was seen by Dr. Susette Racer - psychiatrist , last visit 05/2020 and has been released per patient/son's request, she does not want to take Remeron - afraid of polypharmacy. She was on clonazepam but has been off for months and has not noticed any difference in symptoms . Son states today she should go away and hide to not disappoint him. She continues to refuse seeing a psychiatrist or taking medications  ? ?Hypothyroidism: she has been taking medication daily now, 88 mcg daily , she was supposed to be up to 112 mcg daily , we switched from 88 mcg to  100 mcg  last visit and she states she has been complaint  . She continues to lose weight, has some dysphagia that is not secondary to thyroid, brittle nails and hair loss but likely from malnutrition  ?  ?Paroxysmal Afib: she never started Eliquis, she states cardiologist called her for a visit but she did not schedule it. She  denies chest pain or palpitation, she is afraid of taking medications.Son is aware of risk of strokes . Unchanged  ? ?Atherosclerosis of aorta: found on CT chest , patient is not interested on statin therapy ? ?Pancytopenia: she has been seeing Dr. Grayland Ormond, showed pancytopenia, last labs in Nov WBC down 2.6, HCT 35.1 and platelets 137 , she agrees on having labs done again. She missed appointment with Dr. Grayland Ormond yesterday  ? ?Vitamin D deficiency: she is refusing to take liquid vitamin D ? ?Patient Active Problem List  ? Diagnosis Date Noted  ? Hyponatremia 09/24/2020  ? Protein-calorie malnutrition, severe 09/13/2020  ? Esophageal motility disorder   ? Palliative care by specialist   ? Advanced care planning/counseling discussion   ? Failure to thrive in adult 09/11/2020  ? Avoidant-restrictive food intake disorder (ARFID) 08/30/2020  ? Paroxysmal atrial fibrillation ([place]) 03/22/2020  ? Vitamin D deficiency 03/01/2020  ? Dysphagia   ? Arrhythmia 08/25/2019  ? Rectal bleeding   ? Low grade squamous intraepith lesion on cytologic smear cervix (lgsil) 08/04/2018  ? Chronic venous insufficiency 07/05/2018  ? Lymphedema 07/05/2018  ? Hypertension 04/02/2018  ? Swelling of limb 04/02/2018  ? Depression, major, recurrent, mild ([place]) 12/29/2017  ? Leukopenia 05/24/2017  ? Allergic rhinitis, seasonal 06/29/2015  ? Clavus 06/29/2015  ? 1st degree AV block 06/29/2015  ? Hammer toe 06/29/2015  ? H/O: HTN (  hypertension) 06/29/2015  ? Blood glucose elevated 06/29/2015  ? Floater, vitreous 06/29/2015  ? Bilateral tinnitus 06/29/2015  ? Hypothyroidism (acquired) 04/02/2015  ? Gastroesophageal reflux disease 04/02/2015  ? Hammer toe of right foot 04/02/2015  ? Scoliosis of thoracic spine 04/02/2015  ? Urethral prolapse 04/02/2015  ? Corn of toe 04/02/2015  ? Hyperlipidemia 04/02/2015  ? Varicose veins of both lower extremities 04/02/2015  ? ? ?Past Surgical History:  ?Procedure Laterality Date  ? ABDOMINAL HYSTERECTOMY  1989  ?  APPENDECTOMY  1989  ? BREAST EXCISIONAL BIOPSY Bilateral   ? multiple biopsies negative  ? BREAST SURGERY    ? Several  ? COLONOSCOPY WITH PROPOFOL N/A 07/28/2019  ? Procedure: COLONOSCOPY WITH PROPOFOL;  Surgeon: Lin Landsman, MD;  Location: [facility] ENDOSCOPY;  Service: Gastroenterology;  Laterality: N/A;  ? ESOPHAGOGASTRODUODENOSCOPY (EGD) WITH PROPOFOL N/A 11/11/2019  ? Procedure: ESOPHAGOGASTRODUODENOSCOPY (EGD) WITH PROPOFOL;  Surgeon: Lin Landsman, MD;  Location: [facility] ENDOSCOPY;  Service: Gastroenterology;  Laterality: N/A;  ? TUMOR EXCISION  1989  ? same time as hysterectomy  ? ? ?Family History  ?Problem Relation Age of Onset  ? Diabetes Father 17  ?     DM complications  ? Hypertension Mother 33  ?     CKD  ? Thyroid disease Mother   ? Heart failure Mother   ? Kidney disease Mother   ? Depression Mother   ? Breast cancer Sister 24  ? Depression Sister   ? Kidney disease Other   ? Breast cancer Maternal Aunt   ? Breast cancer Cousin   ? Anuerysm Son 64  ? Diabetes Son   ? ? ?Social History  ? ?Tobacco Use  ? Smoking status: Never  ? Smokeless tobacco: Never  ? Tobacco comments:  ?  smoking cessation materials not required  ?Substance Use Topics  ? Alcohol use: Never  ?  Alcohol/week: 0.0 standard drinks  ? ? ? ?Current Outpatient Medications:  ?  Ensure (ENSURE), Take 237 mLs by mouth. Pt drinking approx 3 ensures per day, Disp: , Rfl:  ?  levothyroxine (SYNTHROID) 100 MCG tablet, Take 1 tablet (100 mcg total) by mouth daily., Disp: 90 tablet, Rfl: 0 ?  megestrol (MEGACE) 40 MG tablet, Take 1 tablet (40 mg total) by mouth daily., Disp: 30 tablet, Rfl: 2 ?  omeprazole (PRILOSEC) 40 MG capsule, Take 1 capsule (40 mg total) by mouth daily before breakfast., Disp: 90 capsule, Rfl: 3 ?  Cholecalciferol (VITAMIN D-3) 5000 UNIT/ML LIQD, Place 1 mL under the tongue every other day. (Patient not taking: Reported on 11/08/2021), Disp: 30 mL, Rfl: 5 ? ?Allergies  ?Allergen Reactions  ? Aspirin   ?  Tinnitus  ?  Citalopram Other (See Comments)  ? Codeine Nausea And Vomiting  ? Penicillins Rash  ?  Has patient had a PCN reaction causing immediate rash, facial/tongue/throat swelling, SOB or lightheadedness with hypotension: Yes ?Has patient had a PCN reaction causing severe rash involving mucus membranes or skin necrosis: No ?Has patient had a PCN reaction that required hospitalization No ?Has patient had a PCN reaction occurring within the last 10 years: No ?If all of the above answers are "NO", then may proceed with Cephalosporin use.  ? ? ?I personally reviewed active problem list, medication list, allergies, family history, social history with the patient/caregiver today. ? ? ?ROS ? ?Ten systems reviewed and is negative except as mentioned in HPI  ? ?Objective ? ?Vitals:  ? 02/25/22 1349  ?BP: 90/78  ?  Pulse: (!) 52  ?Resp: 12  ?Temp: 97.7 ?F (36.5 ?C)  ?TempSrc: Oral  ?SpO2: 99%  ?Weight: 71 lb 14.4 oz (32.6 kg)  ?Height: 5' (1.524 m)  ? ? ?Body mass index is 14.04 kg/m?. ? ?Physical Exam ? ?Constitutional: Patient appears petite and malnourished with sunken eyes and temporal waisting No distress.  ?HEENT: head atraumatic, normocephalic, pupils equal and reactive to light, ears , neck supple, throat within normal limits ?Cardiovascular: Normal rate, regular rhythm and normal heart sounds.  No murmur heard. No BLE edema. ?Pulmonary/Chest: Effort normal and breath sounds normal. No respiratory distress. ?Abdominal: Soft.  There is no tenderness. ?Psychiatric: Patient has a normal mood and affect. behavior is normal. Judgment and thought content normal.  ? ? ?PHQ2/9: ? ?  02/25/2022  ?  1:58 PM 11/08/2021  ?  2:22 PM 09/17/2021  ?  3:00 PM 07/08/2021  ?  2:25 PM 03/20/2021  ? 11:07 AM  ?Depression screen PHQ 2/9  ?Decreased Interest 2 0 0 1 1  ?Down, Depressed, Hopeless 2 0 0 1 1  ?PHQ - 2 Score 4 0 0 2 2  ?Altered sleeping 0 0  0 0  ?Tired, decreased energy 2 0  1 3  ?Change in appetite 2 0  1 1  ?Feeling bad or failure about  yourself  2 0  1 1  ?Trouble concentrating 2 0  0 0  ?Moving slowly or fidgety/restless 0 0  0 0  ?Suicidal thoughts 0 0  0 0  ?PHQ-9 Score 12 0  5 7  ?Difficult doing work/chores Somewhat difficult    Some

## 2022-02-26 ENCOUNTER — Telehealth: Payer: Self-pay | Admitting: *Deleted

## 2022-02-26 ENCOUNTER — Other Ambulatory Visit: Payer: Medicare HMO

## 2022-02-26 ENCOUNTER — Ambulatory Visit: Payer: Self-pay | Admitting: *Deleted

## 2022-02-26 ENCOUNTER — Other Ambulatory Visit: Payer: Self-pay | Admitting: Family Medicine

## 2022-02-26 VITALS — BP 76/64 | HR 76 | Temp 97.7°F | Resp 16 | Wt 71.8 lb

## 2022-02-26 DIAGNOSIS — Z515 Encounter for palliative care: Secondary | ICD-10-CM

## 2022-02-26 LAB — COMPLETE METABOLIC PANEL WITH GFR
AG Ratio: 1.3 (calc) (ref 1.0–2.5)
ALT: 19 U/L (ref 6–29)
AST: 27 U/L (ref 10–35)
Albumin: 4.1 g/dL (ref 3.6–5.1)
Alkaline phosphatase (APISO): 48 U/L (ref 37–153)
BUN/Creatinine Ratio: 24 (calc) — ABNORMAL HIGH (ref 6–22)
BUN: 13 mg/dL (ref 7–25)
CO2: 34 mmol/L — ABNORMAL HIGH (ref 20–32)
Calcium: 9.6 mg/dL (ref 8.6–10.4)
Chloride: 104 mmol/L (ref 98–110)
Creat: 0.55 mg/dL — ABNORMAL LOW (ref 0.60–1.00)
Globulin: 3.2 g/dL (calc) (ref 1.9–3.7)
Glucose, Bld: 79 mg/dL (ref 65–99)
Potassium: 6.1 mmol/L — ABNORMAL HIGH (ref 3.5–5.3)
Sodium: 143 mmol/L (ref 135–146)
Total Bilirubin: 0.5 mg/dL (ref 0.2–1.2)
Total Protein: 7.3 g/dL (ref 6.1–8.1)
eGFR: 95 mL/min/{1.73_m2} (ref >=60)

## 2022-02-26 LAB — CBC WITH DIFFERENTIAL/PLATELET
Absolute Monocytes: 344 cells/uL (ref 200–950)
Basophils Absolute: 40 cells/uL (ref 0–200)
Basophils Relative: 1.3 %
Eosinophils Absolute: 71 cells/uL (ref 15–500)
Eosinophils Relative: 2.3 %
HCT: 34.5 % — ABNORMAL LOW (ref 35.0–45.0)
Hemoglobin: 11.6 g/dL — ABNORMAL LOW (ref 11.7–15.5)
Lymphs Abs: 1621 cells/uL (ref 850–3900)
MCH: 31 pg (ref 27.0–33.0)
MCHC: 33.6 g/dL (ref 32.0–36.0)
MCV: 92.2 fL (ref 80.0–100.0)
MPV: 12.1 fL (ref 7.5–12.5)
Monocytes Relative: 11.1 %
Neutro Abs: 1023 cells/uL — ABNORMAL LOW (ref 1500–7800)
Neutrophils Relative %: 33 %
Platelets: 141 10*3/uL (ref 140–400)
RBC: 3.74 10*6/uL — ABNORMAL LOW (ref 3.80–5.10)
RDW: 12.8 % (ref 11.0–15.0)
Total Lymphocyte: 52.3 %
WBC: 3.1 10*3/uL — ABNORMAL LOW (ref 3.8–10.8)

## 2022-02-26 LAB — TSH: TSH: 17.98 mIU/L — ABNORMAL HIGH (ref 0.40–4.50)

## 2022-02-26 LAB — VITAMIN D 25 HYDROXY (VIT D DEFICIENCY, FRACTURES): Vit D, 25-Hydroxy: 21 ng/mL — ABNORMAL LOW (ref 30–100)

## 2022-02-26 NOTE — Telephone Encounter (Signed)
?  Chief Complaint: Hypotensive ?Symptoms: BP 76/64 per Raynelle Fanning RN with [facility] ?Frequency: This afternoon ?Pertinent Negatives: Patient denies dizziness, weakness. Pt is asymptomatic. "I feel fine." ?Disposition: [] ED /[] Urgent Care (no appt availability in office) / [] Appointment(In office/virtual)/ []  [place] Virtual [facility]/ [] Home Care/ [] Refused Recommended Disposition /[] [place] [facility]/ [x]  Follow-up with PCP ?Additional Notes: Home Health nurse reporting BP, not with pt. Called pt for triage. Pt states she is staying hydrated, urinating as usual, not dark,"I feel fine." Asymptomatic. No BP meds. Son present, also states pt asymptomatic. Pt reluctant to triage. Assured son NT would route to practice for PCPs review. Care advise provided, verbalizes understanding. ?Reason for Disposition ? [1] Systolic BP < 90 AND [2] NOT dizzy, lightheaded or weak ? ?Answer Assessment - Initial Assessment Questions ?1. BLOOD PRESSURE: "What is the blood pressure?" "Did you take at least two measurements 5 minutes apart?" ?    76/64 per RN with [facility] ?2. ONSET: "When did you take your blood pressure?" ?    This afternoon ?3. HOW: "How did you obtain the blood pressure?" (e.g., visiting nurse, automatic home BP monitor) ?    Home Health ?4. HISTORY: "Do you have a history of low blood pressure?" "What is your blood pressure normally?" ?     ?5. MEDICATIONS: "Are you taking any medications for blood pressure?" If Yes, ask: "Have they been changed recently?" ?    no ?6. PULSE RATE: "Do you know what your pulse rate is?"  ?    85 ?7. OTHER SYMPTOMS: "Have you been sick recently?" "Have you had a recent injury?" ? ?Protocols used: Blood Pressure - Low-A-AH ? ?

## 2022-02-26 NOTE — Progress Notes (Signed)
COMMUNITY PALLIATIVE CARE SW NOTE ? ?PATIENT NAME: Maureen [name] ?DOB: 12-31-1945 ?MRN: [id number] ? ?PRIMARY CARE PROVIDER: Steele Sizer, MD ? ?RESPONSIBLE PARTY:  ?Acct ID - Guarantor Home Phone Work Phone Relationship Acct Type  ?[id number] - Boroff,PATRIC* [phone]  Self P/F  ?   [place], [place], [place] [address]  ? ? ? ?PLAN OF CARE and INTERVENTIONS:             ? ?     GOALS OF CARE/ ADVANCE CARE PLANNING: Goals include to maximize quality of life and symptom management. Our advance care planning conversation included a discussion about:    ?The value and importance of advance care planning  ?Review and updating or creation of an advance directive document.  ?                        Patient is a FULL CODE. MOST form reviewed, and copy left in the home. Per patient ACD/HCPOA has been discussed and family is currently working on completing the documents for patients son, Maureen [name], to be POA. ?  ?2.       Palliative care encounter: Palliative medicine team will continue to support patient, patient's family, and medical team. Visit consisted of counseling and education dealing with the complex and emotionally intense issues of symptom management and palliative care in the setting of serious and potentially life-threatening illness. ? ?SW and RN met with patient for f/u in home visit. Patients son, Maureen [name], was also present for visit.  Patient lives in apartment complex on first floor independently. ? ?Functional changes/updates: Patient has not had any functional changes since his recent PC visit. Patient denies recent falls. Does not use AD. Fall and safety precautions discussed.  Patient is independent with all ADL's at the moment. Patient and son share there has been 5lb weight loss in past 3-6 months, due to decrease in appetite. ? ?Home & Environment assessment: No concerns. Patient feels safe in her home environment. Patients home is one level. Patient is able to maneuver around her home without issue.   ? ?Protein calorie malnutrition/weight loss/Appetite: Patient's appetite is poor, Patient also drinks protein shakes. weight loss reported. Patients family makes meals for her on a weekly basis, of things she like to eat, but patient may not always eat the meal. MOW's discussed, however, patient may not eat these meals. Patient enjoys cornbread and ice cream. ? ?Psychosocial assessment: completed. No additional physical needs identified at this time. Ongoing support/resources will continued to be offered if needed.  ?Medicaid discussed. Patients son shares that his cousin has completed a Medicaid application for patient but needs to follow up. Son will follow up with cousin.  ?Life alert system discussed. ? ?Military hx: N/A  ? ?Medical assessment: RN reviewed medications and took vitals.  ? ?Hospice discussion: N/A ? ?SW discussed goals, reviewed care plan, provided emotional support, used active and reflective listening in the form of reciprocity emotional response. Questions and concerns were addressed. The patient/family was encouraged to call with any additional questions and/or concerns. PC Provided general support and encouragement, no other unmet needs identified. Will continue to follow. ? ?3.         PATIENT/CAREGIVER EDUCATION/ COPING:   ?Appearance: well groomed, appropriate given situation  ?Mental Status: Alert/oriented. ?Eye Contact: Good. Able to engage in proper eye contact  ?Thought Process: rational  ?Thought Content: not assessed  ?Speech: Normal rate, volume, tone  ?Mood: Normal and calm ?Affect:  Congruent to endorsed mood, full ranging ?Insight: normal ?Judgement: normal  ?Interaction Style: Cooperative ? ?Patient A&O, patient engaged in fluent conversation and answered all questions appropriately. Patients mood was jovial during visit today. PHQ 9 is 0. Patient share that enjoys talking to her church friends. Patient has family (sister, daughter, son and niece) that live locally and are  supportive. Patient share that she does not go out much, however she shared that she is open to it now that [place] restrictions are being lifted and weather is warming up. ? ?4.         PERSONAL EMERGENCY PLAN:  Patient will call 9-1-1 for emergencies.   ? ?5.         COMMUNITY RESOURCES COORDINATION/ HEALTH CARE NAVIGATION:  Patient and children manages her care. ? ?6.         FINANCIAL CONCERNS/NEEDS: None. ?                       Primary Health Insurance: [employer] Medicare ?Secondary Health Insurance: N/A ?Prescription Coverage: Yes, no history of difficulty obtaining or affording prescriptions reported ? ?SOCIAL HX:  ?Social History  ? ?Tobacco Use  ? Smoking status: Never  ? Smokeless tobacco: Never  ? Tobacco comments:  ?  smoking cessation materials not required  ?Substance Use Topics  ? Alcohol use: Never  ?  Alcohol/week: 0.0 standard drinks  ? ? ?CODE STATUS: FULL CODE ?ADVANCED DIRECTIVES: discussed ?MOST FORM COMPLETE:  discussed ?HOSPICE EDUCATION PROVIDED: N ? ?PPS: Patient is INDP with all ADL's at this time. Patient is A&O  with good insight and judgement.  ? ? ? ?Time spent: 45 min  ? ? ?Doreene Eland, LCSW ? ?

## 2022-02-26 NOTE — Progress Notes (Addendum)
PATIENT NAME: Maureen [name] ?DOB: [birth year] ?MRN: [id number] ? ?PRIMARY CARE PROVIDER: Alba Cory, MD ? ?RESPONSIBLE PARTY:  ?Acct ID - Guarantor Home Phone Work Phone Relationship Acct Type  ?[id number] - [place],PATRIC* [phone]  Self P/F  ?   [address], [place], [place] [address]  ? ? ?PLAN OF CARE and INTERVENTIONS: ?              1.  GOALS OF CARE/ ADVANCE CARE PLANNING:  Son-Rod is currently working on AD with patient.  Discussed MOST form and blank copy left in the home.  Son believes they previously did this but they are unable to locate forms.  ?              2.  PATIENT/CAREGIVER EDUCATION:  Hydration, Hypotension, Supplement, and Safety ?              4. PERSONAL EMERGENCY PLAN:  Activate 911 for emergencies ?              5.  DISEASE STATUS: ? ?Anorexia:  Ongoing weight loss.  Patient reports no appetite.  Family is assisting with meal preparations. Patient typically eats one meal a day and drinks supplement drinks 1-2 daily.  Son is encouraging patient to drink up to 4 per day.  Patient states she likes ice cream.  We discussed making a milk shake with the supplement drink for additional calories.  Palliative Care NP started Remeron last month but son states she never started this.  PCP has started Megace and so will pick this up today.  Discussed MOW and possible resource but patient did not think she would eat it.   Patient is cachectic in appearance with temporal wasting noted.   ? ?02/26/22 weight 71.8 lbs ?07/08/2021 weight 79 lbs ?11/08/2021 weight 76 lbs ? ?Functional Status:  Patient lives independently on the 1st floor of apartment complex.  She has multiple family members that check on her and assist with meals and appointments.  Patient typically does sponge baths and will accept help when someone is in the home. We discussed the use of a shower chair but patient is not interested.  She does have grab bars in the shower.   She does not require any assistive devices.  No recent falls.  ? ?Hypotension:   Blood pressure 90/78 yesterday with PCP visit.  Today, 76/64 and patient is asymptomatic.  She endorses good fluid intake.  Typically consuming 4- 8oz bottles of water and also her supplement drink.  No anti-hypertensive medications listed.  206 pm.  Phone call made to PCP office with update on BP.  Triage will contact patient for follow up.  ? ?Safety:  Discussed life alert with patient and son.  Son will check with [employer] to see if this is covered. ? ?Follow Up with Oncology:  Son advised that patient is to see Dr. Orlie Dakin next week.  Son has asked if she needed to have blood work drawn as PCP drew CBC with diff which is typically ordered by oncology.  Advised that I would follow up with oncology office regarding blood work.  202 pm.  [facility] and message left on the triage line.  ? ?Christin Gusler, NP updated on visit.  ? ? ? ? ? ?HISTORY OF PRESENT ILLNESS:  Maureen [name] is a 76 y.o. year old female  with multiple medical problems including protein calorie malnutrition, adult failure to thrive, dysphagia, atrial fib, atherosclerosis, esophageal motility  disorder, pancytopenia, hypothyroidism, chronic venous insufficiency, varicouse veins BLE, lymphedema, HTN, HTN, GERD, scoliosis of thoracic spine, HLD, Depression, vitamin D deficiency, mood disorder .  Patient is followed by Palliative Care every 4-8 weeks and PRN.  ? ?CODE STATUS: Full ?ADVANCED DIRECTIVES: No ?MOST FORM: No ?PPS: 50% ? ? ?PHYSICAL EXAM:  ? ?VITALS: ?Today's Vitals  ? 02/26/22 1603  ?BP: (!) 76/64  ?Pulse: 76  ?Resp: 16  ?Temp: 97.7 ?F (36.5 ?C)  ?SpO2: 95%  ?Weight: 71 lb 12.8 oz (32.6 kg)  ?PainSc: 0-No pain  ?  ?LUNGS: clear to auscultation  ?CARDIAC: Cor irreg, irreg RRR}  ?EXTREMITIES: 1+ bilateral ankle edema ?SKIN: Skin color, texture, turgor normal. No rashes or lesions or normal  ?NEURO: None ? ? ? ? ? ? ?Truitt Merle, RN ? ?

## 2022-02-26 NOTE — Telephone Encounter (Signed)
Almyra Free with Palliative care called stating that patient just had labs drawn with Dr Ancil Boozer and is asking if she still needs labs drawn in our office. Please advise and notify Almyra Free. ? ?CBC with Differential/Platelet ?Order: [id number] ?Status: Final result    ?Visible to patient: No (inaccessible in MyChart)    ?Next appt: 03/04/2022 at 01:45 PM in Oncology ([facility])    ?Dx: Pancytopenia ([place])    ?2 Result Notes ?          ?Component Ref Range & Units 1 d ago ?(02/25/22) 6 mo ago ?(08/27/21) 1 yr ago ?(01/25/21) 1 yr ago ?(11/20/20) 1 yr ago ?(10/15/20) 1 yr ago ?(09/18/20) 1 yr ago ?(09/16/20)  ?WBC 3.8 - 10.8 Thousand/uL 3.1 Low   2.6 Low  R  3.0 Low  R  2.7 Low   2.6 Low   3.4 Low  R  3.1 Low  R   ?RBC 3.80 - 5.10 Million/uL 3.74 Low   3.90 R  3.71 Low  R  3.69 Low   3.63 Low   3.03 Low  R  2.90 Low  R   ?Hemoglobin 11.7 - 15.5 g/dL 11.6 Low   11.7 Low  R  11.4 Low  R  11.9  11.3 Low   9.7 Low  R  9.2 Low  R   ?HCT 35.0 - 45.0 % 34.5 Low   35.1 Low  R  33.6 Low  R  34.4 Low   34.5 Low   27.6 Low  R  26.5 Low  R   ?MCV 80.0 - 100.0 fL 92.2  90.0  90.6  93.2  95.0  91.1  91.4   ?MCH 27.0 - 33.0 pg 31.0  30.0 R  30.7 R  32.2  31.1  32.0 R  31.7 R   ?MCHC 32.0 - 36.0 g/dL 33.6  33.3 R  33.9 R  34.6  32.8  35.1 R  34.7 R   ?RDW 11.0 - 15.0 % 12.8  15.5 R  15.6 High  R  12.3  12.5  15.0 R  14.4 R   ?Platelets 140 - 400 Thousand/uL 141  137 Low  R  157 R  141  164  176 R  158 R   ?MPV 7.5 - 12.5 fL 12.1    11.8  12.4     ?Neutro Abs 1,500 - 7,800 cells/uL 1,023 Low   0.8 Low  R  1.1 Low  R  1,061 Low   918 Low   1.4 Low  R    ?Lymphs Abs 850 - 3,900 cells/uL 1,621  1.4 R  1.5 R  1,291  1,269  1.4 R    ?Absolute Monocytes 200 - 950 cells/uL 344    289  351     ?Eosinophils Absolute 15 - 500 cells/uL 71  0.1 R  0.0 R  30  31  0.1 R    ?Basophils Absolute 0 - 200 cells/uL 40  0.0 R  0.0 R  30  31  0.0 R    ?Neutrophils Relative % % 33  32  37  39.3  35.3  43    ?Total Lymphocyte % 52.3    47.8  48.8     ?Monocytes Relative %  11.1  11  11   10.7  13.5  12    ?Eosinophils Relative % 2.3  3  1   1.1  1.2  3    ?Basophils Relative % 1.3  1  1  1.1  1.2  1    ?nRBC   0.0 R  0.0 R    0.0 R  0.0 R, CM   ?Lymphocytes Relative   53 R  50 R    41 R    ?Monocytes Absolute   0.3 R  0.3 R    0.4 R    ?Immature Granulocytes   0 R  0 R    0 R    ?Abs Immature Granulocytes   0.00 R, CM  0.01 R, CM    0.01 R, CM    ?Resulting Agency  [facility] [place] [facility] [place] [place] [facility] [place] [facility]  ?  ? ?  ?Resulting Agency's Comment ? ?Performing Organization Information:  ?    Site ID: [id number] (CLIA: [id number])  ?    Name: [facility] [place]  ?    Address: [address] [place], [place] [address]  ?    Director: Jackolyn Confer MD  ?  ?Specimen Collected: 02/25/22 14:23 Last Resulted: 02/26/22 01:43  ?  ?  Lab Flowsheet   ? Order Details   ? View Encounter   ? Lab and Collection Details   ? Routing   ? Result History    ?View Encounter Conversation    ?  ?CM=Additional comments  R=Reference range differs from displayed range    ?  ?Result Care Coordination ? ? ?Result Notes ? Carlene Coria, CMA  ?02/26/2022 10:11 AM EDT Back to Top  ?  ?Lab results given. Would like to take the extra Levothyroxine 51mg dose weekly. Pt gave verbal understanding and answered all questions.  ? KSteele Sizer MD  ?02/26/2022  9:24 AM EDT   ?  ?TSH has improved but still not at goal, she needs to take one extra pill of levothyroxine 88 mcg weekly or we can change dose to 100 mcg ?Vitamin D also improved ?Anemia is stable and white count is better  ?Potassium was elevated but likely from hemolysis - break down of cell when the blood was drawn but we can recheck in one week if concerned   ? ? ?Patient Communication ? ? Add Comments   Not seen Back to Top  ?  ?  ? ?Other Results from 02/25/2022 ? ? Contains abnormal data TSH ?Order: [id number]?Status: Final result    ?Visible to patient: No (inaccessible in MyChart)    ?Next appt: 03/04/2022 at  01:45 PM in Oncology ([facility])    ?Dx: Adult hypothyroidism    ?2 Result Notes ?          ?Component Ref Range & Units 1 d ago ?(02/25/22) 3 mo ago ?(11/08/21) 7 mo ago ?(07/08/21) 11 mo ago ?(03/20/21) 1 yr ago ?(11/20/20) 1 yr ago ?(10/15/20) 1 yr ago ?(09/11/20)  ?TSH 0.40 - 4.50 mIU/L 17.98 High   30.01 High   13.85 High   17.62 High   21.84 High   4.55 High   17.499 High  R, CM   ?Resulting Agency  [facility] [place] [facility]  ?  ? ?  ?Resulting Agency's Comment ? ?Performing Organization Information:  ?    Site ID: [id number](CLIA:: [id number]  ?    Name: [facility] [place]  ?    Address[place] [address] [address] ?    Director: JJackolyn ConferMD  ?  ?Specimen Collected: 02/25/22 14:23  Last Resulted: 02/26/22 01:43  ?  ?  ?COMPLETE METABOLIC PANEL WITH GFR ?Order: [id number] ?Status: Final result    ?Visible to patient: No (inaccessible in MyChart)    ?Next appt: 03/04/2022 at 01:45 PM in Oncology ([facility])    ?Dx: Severe protein-energy malnutrition ([place])    ?2 Result Notes ?          ?Component Ref Range & Units 1 d ago ?(02/25/22) 7 mo ago ?(07/08/21) 11 mo ago ?(03/20/21) 1 yr ago ?(11/20/20) 1 yr ago ?(10/15/20) 1 yr ago ?(09/19/20) 1 yr ago ?(09/18/20)  ?Glucose, Bld 65 - 99 mg/dL 79  87 CM  78 CM  73 CM  78 CM  73 R, CM  80 R, CM   ?Comment: .  ?           Fasting reference interval  ?.   ?BUN 7 - 25 mg/dL 13  7  8  8  10  9  R  11 R   ?Creat 0.60 - 1.00 mg/dL 0.55 Low   0.55 Low   0.63 R, CM  0.59 Low  R, CM  0.55 Low  R, CM  0.51 R  0.49 R   ?eGFR > OR = 60 mL/min/1.47m 95  96 CM        ?Comment: The eGFR is based on the CKD-EPI 2021 equation. To calculate  ?the new eGFR from a previous Creatinine or Cystatin C  ?result, go to https://www.kidney.org/professionals/  ?kdoqi/gfr%5Fcalculator   ?BUN/Creatinine Ratio 6 - 22 (calc) 24 High   13  NOT APPLICABLE  14  18      ?Sodium 135 - 146 mmol/L 143  137  135  136  134 Low   134 Low  R  132 Low  R   ?Potassium 3.5 - 5.3 mmol/L 6.1 High   5.1  4.6  4.6  4.5  4.1 R  4.2 R   ?Chloride 98 - 110 mmol/L 104  99  98  97 Low   96 Low   97 Low  R  97 Low  R   ?CO2 20 - 32 mmol/L 34 High   33 High   27  30 CM  30  28 R  28 R   ?Calcium 8.6 - 10.4 mg/dL 9.6  9.2  9.2  9.4  9.3  8.6 Low  R  8.4 Low  R   ?Total Protein 6.1 - 8.1 g/dL 7.3  7.3  7.7       ?Albumin 3.6 - 5.1 g/dL 4.1  4.1  4.2       ?Globulin 1.9 - 3.7 g/dL (calc) 3.2  3.2  3.5       ?AG Ratio 1.0 - 2.5 (calc) 1.3  1.3  1.2       ?Total Bilirubin 0.2 - 1.2 mg/dL 0.5  0.4  0.4       ?Alkaline phosphatase (APISO) 37 - 153 U/L 48  54  54       ?AST 10 - 35 U/L 27  20  24        ?ALT 6 - 29 U/L 19  11  14        ?Resulting Agency  [facility] [place] [facility] [place] [facility] [place] [facility]  ?  ? ?  ?Resulting Agency's Comment ? ?Performing Organization Information:  ?    Site ID: [id number](CLIA:: [id number]  ?    Name: [facility]  [place]  ?    Address: [address] [place], [place] [address]  ?    Director: Jackolyn Confer MD  ?  ?Specimen Collected: 02/25/22 14:23 Last Resulted: 02/26/22 01:43  ?  ?  ? ?

## 2022-02-28 NOTE — Telephone Encounter (Signed)
Call returned to Raynelle Fanning and I left message on voice mail that lab appointment has been cancelled but patient needs to come @ 2 for visit with NP and asked that she please be sure to inform patient. ?

## 2022-03-03 ENCOUNTER — Telehealth: Payer: Self-pay

## 2022-03-03 NOTE — Telephone Encounter (Signed)
845 am.  Follow up call made to son Maureen [name] to advise patient did not need to repeat blood work with oncology tomorrow but arrive by 2 for scheduled appointment.  Son advised patient has been doing well and is eating.  They do not have a blood pressure cuff and have not re-checked blood pressure.  Son advised patient has been asymptomatic.  No other concerns voiced at this time.  ?

## 2022-03-04 ENCOUNTER — Inpatient Hospital Stay: Payer: Medicare HMO

## 2022-03-04 ENCOUNTER — Inpatient Hospital Stay: Payer: Medicare HMO | Admitting: Nurse Practitioner

## 2022-03-11 ENCOUNTER — Inpatient Hospital Stay: Payer: Medicare HMO | Admitting: Nurse Practitioner

## 2022-03-13 ENCOUNTER — Inpatient Hospital Stay: Payer: Medicare HMO | Attending: Nurse Practitioner | Admitting: Medical Oncology

## 2022-03-13 ENCOUNTER — Inpatient Hospital Stay: Payer: Medicare HMO | Admitting: Nurse Practitioner

## 2022-03-13 ENCOUNTER — Encounter: Payer: Self-pay | Admitting: Medical Oncology

## 2022-03-13 DIAGNOSIS — D709 Neutropenia, unspecified: Secondary | ICD-10-CM | POA: Diagnosis not present

## 2022-03-13 DIAGNOSIS — E559 Vitamin D deficiency, unspecified: Secondary | ICD-10-CM | POA: Diagnosis not present

## 2022-03-13 DIAGNOSIS — E875 Hyperkalemia: Secondary | ICD-10-CM | POA: Diagnosis not present

## 2022-03-13 DIAGNOSIS — R627 Adult failure to thrive: Secondary | ICD-10-CM

## 2022-03-13 DIAGNOSIS — E039 Hypothyroidism, unspecified: Secondary | ICD-10-CM

## 2022-03-13 NOTE — Progress Notes (Signed)
Virtual Visit Progress Note  Maureen [name],you are scheduled for a virtual visit with your provider today.    Just as we do with appointments in the office, we must obtain your consent to participate.  Your consent will be active for this visit and any virtual visit you may have with one of our providers in the next 365 days.    If you have a MyChart account, I can also send a copy of this consent to you electronically.  All virtual visits are billed to your insurance company just like a traditional visit in the office.  As this is a virtual visit, video technology does not allow for your provider to perform a traditional examination.  This may limit your provider's ability to fully assess your condition.  If your provider identifies any concerns that need to be evaluated in person or the need to arrange testing such as labs, EKG, etc, we will make arrangements to do so.    Although advances in technology are sophisticated, we cannot ensure that it will always work on either your end or our end.  If the connection with a video visit is poor, we may have to switch to a telephone visit.  With either a video or telephone visit, we are not always able to ensure that we have a secure connection.   I need to obtain your verbal consent now.   Are you willing to proceed with your visit today?   Maureen [name] has provided verbal consent on 03/13/2022 for a virtual visit (video or telephone).   Hughie Closs, PA-C 03/13/2022  3:06 PM    I connected with Maureen [name] on 03/13/22 at  1:45 PM EDT by video enabled telemedicine visit and verified that I am speaking with the correct person using two identifiers.   I discussed the limitations, risks, security and privacy concerns of performing an evaluation and management service by telemedicine and the availability of in-person appointments. I also discussed with the patient that there may be a patient responsible charge related to this service. The patient  expressed understanding and agreed to proceed.   Other persons participating in the visit and their role in the encounter: Son   Patient's location: Home  Provider's location: Clinic   Chief Complaint: Neutropenia    Patient Care Team: Steele Sizer, MD as PCP - General (Family Medicine) Anell Barr, [place] as Consulting Physician (Optometry) Dew, Erskine Squibb, MD as Consulting Physician (Vascular Surgery) Lloyd Huger, MD as Consulting Physician (Oncology) Gae Dry, MD (Inactive) as Referring Physician (Obstetrics and Gynecology) Norman Clay, MD as Consulting Physician (Psychiatry) Vern Claude, [place] as Social Worker   Name of the patient: Maureen [name]  [id number]  01/05/1946   Date of visit: 03/13/22  History of Presenting Illness- Patient reports that she is doing well. Recently had labs drawn by her PCP. TSH level continues to be elevated but is improved at 17.98 from 30.01, vitamin D was low but improved at 25 from 16 (she is not on supplementation), CMP showed a slightly elevated potassium of 6.1 (no previous elevations recently) and otherwise mild changes. CBC showed a improved WBC count of 3.1 from 2.6 and ANC improvement of 1.023 from 0.8. Platelets normalized 141 from 137. She has lost weight since her last visit- now 29 was 78. She reports appetite is down.   Review of systems- ROS   Allergies  Allergen Reactions   Aspirin     Tinnitus  Citalopram Other (See Comments)   Codeine Nausea And Vomiting   Penicillins Rash    Has patient had a PCN reaction causing immediate rash, facial/tongue/throat swelling, SOB or lightheadedness with hypotension: Yes Has patient had a PCN reaction causing severe rash involving mucus membranes or skin necrosis: No Has patient had a PCN reaction that required hospitalization No Has patient had a PCN reaction occurring within the last 10 years: No If all of the above answers are "NO", then may proceed with Cephalosporin  use.    Past Medical History:  Diagnosis Date   Allergy    Corn of toe    Depression    First degree AV block    GERD (gastroesophageal reflux disease)    Hammer toe of right foot    Hyperglycemia    Hyperlipidemia    Hypertension    Loss of appetite    Prolapse of urethra    Scoliosis (and kyphoscoliosis), idiopathic    Thyroid disease    Tinnitus of both ears    Vitreous floaters of right eye     Past Surgical History:  Procedure Laterality Date   ABDOMINAL HYSTERECTOMY  1989   APPENDECTOMY  1989   BREAST EXCISIONAL BIOPSY Bilateral    multiple biopsies negative   BREAST SURGERY     Several   COLONOSCOPY WITH PROPOFOL N/A 07/28/2019   Procedure: COLONOSCOPY WITH PROPOFOL;  Surgeon: Lin Landsman, MD;  Location: [facility] ENDOSCOPY;  Service: Gastroenterology;  Laterality: N/A;   ESOPHAGOGASTRODUODENOSCOPY (EGD) WITH PROPOFOL N/A 11/11/2019   Procedure: ESOPHAGOGASTRODUODENOSCOPY (EGD) WITH PROPOFOL;  Surgeon: Lin Landsman, MD;  Location: [facility] ENDOSCOPY;  Service: Gastroenterology;  Laterality: N/A;   TUMOR EXCISION  1989   same time as hysterectomy    Social History   Socioeconomic History   Marital status: Widowed    Spouse name: Not on file   Number of children: 3   Years of education: 68   Highest education level: 12th grade  Occupational History   Occupation: Retired  Tobacco Use   Smoking status: Never   Smokeless tobacco: Never   Tobacco comments:    smoking cessation materials not required  Vaping Use   Vaping Use: Never used  Substance and Sexual Activity   Alcohol use: Never    Alcohol/week: 0.0 standard drinks   Drug use: No   Sexual activity: Not Currently    Partners: Male  Other Topics Concern   Not on file  Social History Narrative   Pt lives alone; son [name] nearby and helps almost daily   Social Determinants of Health   Financial Resource Strain: Low Risk    Difficulty of Paying Living Expenses: Not hard at all  Food  Insecurity: No Food Insecurity   Worried About Charity fundraiser in the Last Year: Never true   Arboriculturist in the Last Year: Never true  Transportation Needs: No Transportation Needs   Lack of Transportation (Medical): No   Lack of Transportation (Non-Medical): No  Physical Activity: Inactive   Days of Exercise per Week: 0 days   Minutes of Exercise per Session: 0 min  Stress: No Stress Concern Present   Feeling of Stress : Not at all  Social Connections: Moderately Integrated   Frequency of Communication with Friends and Family: More than three times a week   Frequency of Social Gatherings with Friends and Family: More than three times a week   Attends Religious Services: More than 4 times per year  Active Member of Clubs or Organizations: Yes   Attends Archivist Meetings: More than 4 times per year   Marital Status: Widowed  Human resources officer Violence: Not At Risk   Fear of Current or Ex-Partner: No   Emotionally Abused: No   Physically Abused: No   Sexually Abused: No    There is no immunization history for the selected administration types on file for this patient.  Family History  Problem Relation Age of Onset   Diabetes Father 45       DM complications   Hypertension Mother 35       CKD   Thyroid disease Mother    Heart failure Mother    Kidney disease Mother    Depression Mother    Breast cancer Sister 69   Depression Sister    Kidney disease Other    Breast cancer Maternal Aunt    Breast cancer Cousin    Anuerysm Son 55   Diabetes Son      Current Outpatient Medications:    Ensure (ENSURE), Take 237 mLs by mouth. Pt drinking approx 3 ensures per day, Disp: , Rfl:    levothyroxine (SYNTHROID) 100 MCG tablet, Take 1 tablet (100 mcg total) by mouth daily., Disp: 90 tablet, Rfl: 0   megestrol (MEGACE) 40 MG tablet, Take 1 tablet (40 mg total) by mouth daily. (Patient not taking: Reported on 03/13/2022), Disp: 30 tablet, Rfl: 2   mirtazapine  (REMERON) 15 MG tablet, Take 15 mg by mouth at bedtime. (Patient not taking: Reported on 03/13/2022), Disp: , Rfl:    omeprazole (PRILOSEC) 40 MG capsule, Take 1 capsule (40 mg total) by mouth daily before breakfast. (Patient not taking: Reported on 03/13/2022), Disp: 90 capsule, Rfl: 3  Physical exam: Exam limited due to telemedicine Physical Exam    Assessment and plan- Patient is a 76 y.o. female accompanied by her son   Visit Diagnosis 1. Neutropenia, unspecified type ([place])   2. Vitamin D deficiency   3. Failure to thrive in adult   4. Hypothyroidism, unspecified type   5. Hyperkalemia    Chronic and improved. Follow up in 6 months with labs  Chronic and not well controlled. She will start 2,000 IU once daily supplementation. Continue follow up by PCP Acute on chronic. She is interested in Boost and Ensure drinks. Goal 3 per day plus meals. Will monitor weight. Goal for stable weight to weight gain for her 3 month follow up with PCP.  Chronic- improved but not well controlled. PCP made medication adjustment.  New. Suspect lab error as she is not on potassium supplementation, does not eat a high potassium diet and is not on potassium sparing medication. PCP to monitor but we did discuss red flag signs and symptoms.   PLAN:  RTC 6 months with labs (CBC)   Patient expressed understanding and was in agreement with this plan. She also understands that She can call clinic at any time with any questions, concerns, or complaints.   I discussed the assessment and treatment plan with the patient. The patient was provided an opportunity to ask questions and all were answered. The patient agreed with the plan and demonstrated an understanding of the instructions.   The patient was advised to call back or seek an in-person evaluation if the symptoms worsen or if the condition fails to improve as anticipated.   I spent 15 minutes face-to-face video visit time dedicated to the care of this patient  on the date  of this encounter to include pre-visit review of labs stated above, face-to-face time with the patient, and post visit ordering of testing/documentation.    Thank you for allowing me to participate in the care of this very pleasant patient.   [facility]  CC:

## 2022-03-13 NOTE — Progress Notes (Signed)
Spoke with pt, verified name and date of birth.

## 2022-04-01 ENCOUNTER — Telehealth: Payer: Self-pay

## 2022-04-01 NOTE — Telephone Encounter (Signed)
125 pm.  Phone call made to son Maureen [name] to schedule a home visit for Maureen Mons, NP.  No answer.  Message left requesting a call back.

## 2022-05-27 ENCOUNTER — Telehealth: Payer: Self-pay | Admitting: Nurse Practitioner

## 2022-05-27 NOTE — Telephone Encounter (Signed)
I called, message left for scheduling f/u pc, contact information provided.

## 2022-06-03 NOTE — Progress Notes (Deleted)
Name: Maureen [name]   MRN: [id number]    DOB: [birth year]   Date:06/03/2022       Progress Note  Subjective  Chief Complaint  Follow Up  HPI  Malnutrition: She has been evaluation by hematologist , gastroenterologist, psychiatrist and dietician . She continue to refuse to eat ,son states she no longer wants company for when she is eating. We referred her to palliative care has an appointment with her tomorrow. She told me she eats ice cream, corn bread, apple sauce and drinks Ensure, sometimes eats mash potatoes and candied yams. Her son gets frustrated because he thinks she is not putting enough effort into getting better and gaining weight. Explained to him she has an eating disorder and now is important to love and accept her choices, discussed with patient that [facility] has eating disorder sub-specialist and also in patient care but she needs to be willing to go .   Discussed megace to see if it would help with her appetite and she would like that . I also discussed hospice care   MDD: she was seen by Dr. Salvatore Decent - psychiatrist , last visit 05/2020 and has been released per patient/son's request, she does not want to take Remeron - afraid of polypharmacy. She was on clonazepam but has been off for months and has not noticed any difference in symptoms . Son states today she should go away and hide to not disappoint him. She continues to refuse seeing a psychiatrist or taking medications   Hypothyroidism: she has been taking medication daily now, 88 mcg daily , she was supposed to be up to 112 mcg daily , we switched from 88 mcg to  100 mcg  last visit and she states she has been complaint  . She continues to lose weight, has some dysphagia that is not secondary to thyroid, brittle nails and hair loss but likely from malnutrition    Paroxysmal Afib: she never started Eliquis, she states cardiologist called her for a visit but she did not schedule it. She denies chest pain or palpitation, she is afraid of  taking medications.Son is aware of risk of strokes . Unchanged   Atherosclerosis of aorta: found on CT chest , patient is not interested on statin therapy  Pancytopenia: she has been seeing Dr. Orlie Dakin, showed pancytopenia, last labs in Nov WBC down 2.6, HCT 35.1 and platelets 137 , she agrees on having labs done again. She missed appointment with Dr. Orlie Dakin yesterday   Vitamin D deficiency: she is refusing to take liquid vitamin D  Patient Active Problem List   Diagnosis Date Noted   Hyponatremia 09/24/2020   Protein-calorie malnutrition, severe 09/13/2020   Esophageal motility disorder    Palliative care by specialist    Advanced care planning/counseling discussion    Failure to thrive in adult 09/11/2020   Avoidant-restrictive food intake disorder (ARFID) 08/30/2020   Paroxysmal atrial fibrillation (HCC) 03/22/2020   Vitamin D deficiency 03/01/2020   Dysphagia    Arrhythmia 08/25/2019   Rectal bleeding    Low grade squamous intraepith lesion on cytologic smear cervix (lgsil) 08/04/2018   Chronic venous insufficiency 07/05/2018   Lymphedema 07/05/2018   Hypertension 04/02/2018   Swelling of limb 04/02/2018   Depression, major, recurrent, mild (HCC) 12/29/2017   Leukopenia 05/24/2017   Allergic rhinitis, seasonal 06/29/2015   Clavus 06/29/2015   1st degree AV block 06/29/2015   Hammer toe 06/29/2015   H/O: HTN (hypertension) 06/29/2015   Blood glucose elevated 06/29/2015  Floater, vitreous 06/29/2015   Bilateral tinnitus 06/29/2015   Hypothyroidism (acquired) 04/02/2015   Gastroesophageal reflux disease 04/02/2015   Hammer toe of right foot 04/02/2015   Scoliosis of thoracic spine 04/02/2015   Urethral prolapse 04/02/2015   Corn of toe 04/02/2015   Hyperlipidemia 04/02/2015   Varicose veins of both lower extremities 04/02/2015    Past Surgical History:  Procedure Laterality Date   ABDOMINAL HYSTERECTOMY  1989   APPENDECTOMY  1989   BREAST EXCISIONAL BIOPSY  Bilateral    multiple biopsies negative   BREAST SURGERY     Several   COLONOSCOPY WITH PROPOFOL N/A 07/28/2019   Procedure: COLONOSCOPY WITH PROPOFOL;  Surgeon: Lin Landsman, MD;  Location: [facility];  Service: Gastroenterology;  Laterality: N/A;   ESOPHAGOGASTRODUODENOSCOPY (EGD) WITH PROPOFOL N/A 11/11/2019   Procedure: ESOPHAGOGASTRODUODENOSCOPY (EGD) WITH PROPOFOL;  Surgeon: Lin Landsman, MD;  Location: [facility] ENDOSCOPY;  Service: Gastroenterology;  Laterality: N/A;   TUMOR EXCISION  1989   same time as hysterectomy    Family History  Problem Relation Age of Onset   Diabetes Father 63       DM complications   Hypertension Mother 85       CKD   Thyroid disease Mother    Heart failure Mother    Kidney disease Mother    Depression Mother    Breast cancer Sister 25   Depression Sister    Kidney disease Other    Breast cancer Maternal Aunt    Breast cancer Cousin    Anuerysm Son 42   Diabetes Son     Social History   Tobacco Use   Smoking status: Never   Smokeless tobacco: Never   Tobacco comments:    smoking cessation materials not required  Substance Use Topics   Alcohol use: Never    Alcohol/week: 0.0 standard drinks of alcohol     Current Outpatient Medications:    Ensure (ENSURE), Take 237 mLs by mouth. Pt drinking approx 3 ensures per day, Disp: , Rfl:    levothyroxine (SYNTHROID) 100 MCG tablet, Take 1 tablet (100 mcg total) by mouth daily., Disp: 90 tablet, Rfl: 0   megestrol (MEGACE) 40 MG tablet, Take 1 tablet (40 mg total) by mouth daily. (Patient not taking: Reported on 03/13/2022), Disp: 30 tablet, Rfl: 2   mirtazapine (REMERON) 15 MG tablet, Take 15 mg by mouth at bedtime. (Patient not taking: Reported on 03/13/2022), Disp: , Rfl:    omeprazole (PRILOSEC) 40 MG capsule, Take 1 capsule (40 mg total) by mouth daily before breakfast. (Patient not taking: Reported on 03/13/2022), Disp: 90 capsule, Rfl: 3  Allergies  Allergen Reactions    Aspirin     Tinnitus   Citalopram Other (See Comments)   Codeine Nausea And Vomiting   Penicillins Rash    Has patient had a PCN reaction causing immediate rash, facial/tongue/throat swelling, SOB or lightheadedness with hypotension: Yes Has patient had a PCN reaction causing severe rash involving mucus membranes or skin necrosis: No Has patient had a PCN reaction that required hospitalization No Has patient had a PCN reaction occurring within the last 10 years: No If all of the above answers are "NO", then may proceed with Cephalosporin use.    I personally reviewed active problem list, medication list, allergies, family history, social history, health maintenance with the patient/caregiver today.   ROS  ***  Objective  There were no vitals filed for this visit.  There is no height or weight on file  to calculate BMI.  Physical Exam ***  No results found for this or any previous visit (from the past 2160 hour(s)).   PHQ2/9:    02/25/2022    1:58 PM 11/08/2021    2:22 PM 09/17/2021    3:00 PM 07/08/2021    2:25 PM 03/20/2021   11:07 AM  Depression screen PHQ 2/9  Decreased Interest 2 0 0 1 1  Down, Depressed, Hopeless 2 0 0 1 1  PHQ - 2 Score 4 0 0 2 2  Altered sleeping 0 0  0 0  Tired, decreased energy 2 0  1 3  Change in appetite 2 0  1 1  Feeling bad or failure about yourself  2 0  1 1  Trouble concentrating 2 0  0 0  Moving slowly or fidgety/restless 0 0  0 0  Suicidal thoughts 0 0  0 0  PHQ-9 Score 12 0  5 7  Difficult doing work/chores Somewhat difficult    Somewhat difficult    phq 9 is {gen pos [id number]}   Fall Risk:    02/25/2022    1:58 PM 11/08/2021    2:22 PM 09/17/2021    3:04 PM 07/08/2021    2:25 PM 03/20/2021   11:07 AM  Fall Risk   Falls in the past year? 0 0 0 0 0  Number falls in past yr:  0 0 0 0  Injury with Fall?  0 0 0 0  Risk for fall due to : No Fall Risks No Fall Risks No Fall Risks No Fall Risks   Follow up Falls prevention  discussed Falls prevention discussed Falls prevention discussed Falls prevention discussed Falls prevention discussed      Functional Status Survey:      Assessment & Plan  *** There are no diagnoses linked to this encounter.

## 2022-06-04 ENCOUNTER — Ambulatory Visit: Payer: Medicare HMO | Admitting: Family Medicine

## 2022-06-26 NOTE — Progress Notes (Deleted)
Name: Maureen [name]   MRN: [id number]    DOB: 1946-05-02   Date:06/26/2022       Progress Note  Subjective  Chief Complaint  Follow up   HPI  Malnutrition: She has been evaluation by hematologist , gastroenterologist, psychiatrist and dietician . She continue to refuse to eat ,son states she no longer wants company for when she is eating. We referred her to palliative care has an appointment with her tomorrow. She told me she eats ice cream, corn bread, apple sauce and drinks Ensure, sometimes eats mash potatoes and candied yams. Her son gets frustrated because he thinks she is not putting enough effort into getting better and gaining weight. Explained to him she has an eating disorder and now is important to love and accept her choices, discussed with patient that [facility] has eating disorder sub-specialist and also in patient care but she needs to be willing to go .   Discussed megace to see if it would help with her appetite and she would like that . I also discussed hospice care   MDD: she was seen by Dr. Salvatore Decent - psychiatrist , last visit 05/2020 and has been released per patient/son's request, she does not want to take Remeron - afraid of polypharmacy. She was on clonazepam but has been off for months and has not noticed any difference in symptoms . Son states today she should go away and hide to not disappoint him. She continues to refuse seeing a psychiatrist or taking medications   Hypothyroidism: she has been taking medication daily now, 88 mcg daily , she was supposed to be up to 112 mcg daily , we switched from 88 mcg to  100 mcg  last visit and she states she has been complaint  . She continues to lose weight, has some dysphagia that is not secondary to thyroid, brittle nails and hair loss but likely from malnutrition    Paroxysmal Afib: she never started Eliquis, she states cardiologist called her for a visit but she did not schedule it. She denies chest pain or palpitation, she is afraid  of taking medications.Son is aware of risk of strokes . Unchanged   Atherosclerosis of aorta: found on CT chest , patient is not interested on statin therapy  Pancytopenia: she has been seeing Dr. Orlie Dakin, showed pancytopenia, last labs in Nov WBC down 2.6, HCT 35.1 and platelets 137 , she agrees on having labs done again. She missed appointment with Dr. Orlie Dakin yesterday   Vitamin D deficiency: she is refusing to take liquid vitamin D Patient Active Problem List   Diagnosis Date Noted   Hyponatremia 09/24/2020   Protein-calorie malnutrition, severe 09/13/2020   Esophageal motility disorder    Palliative care by specialist    Advanced care planning/counseling discussion    Failure to thrive in adult 09/11/2020   Avoidant-restrictive food intake disorder (ARFID) 08/30/2020   Paroxysmal atrial fibrillation (HCC) 03/22/2020   Vitamin D deficiency 03/01/2020   Dysphagia    Arrhythmia 08/25/2019   Rectal bleeding    Low grade squamous intraepith lesion on cytologic smear cervix (lgsil) 08/04/2018   Chronic venous insufficiency 07/05/2018   Lymphedema 07/05/2018   Hypertension 04/02/2018   Swelling of limb 04/02/2018   Depression, major, recurrent, mild (HCC) 12/29/2017   Leukopenia 05/24/2017   Allergic rhinitis, seasonal 06/29/2015   Clavus 06/29/2015   1st degree AV block 06/29/2015   Hammer toe 06/29/2015   H/O: HTN (hypertension) 06/29/2015   Blood glucose elevated 06/29/2015  Floater, vitreous 06/29/2015   Bilateral tinnitus 06/29/2015   Hypothyroidism (acquired) 04/02/2015   Gastroesophageal reflux disease 04/02/2015   Hammer toe of right foot 04/02/2015   Scoliosis of thoracic spine 04/02/2015   Urethral prolapse 04/02/2015   Corn of toe 04/02/2015   Hyperlipidemia 04/02/2015   Varicose veins of both lower extremities 04/02/2015    Past Surgical History:  Procedure Laterality Date   ABDOMINAL HYSTERECTOMY  1989   APPENDECTOMY  1989   BREAST EXCISIONAL BIOPSY  Bilateral    multiple biopsies negative   BREAST SURGERY     Several   COLONOSCOPY WITH PROPOFOL N/A 07/28/2019   Procedure: COLONOSCOPY WITH PROPOFOL;  Surgeon: Lin Landsman, MD;  Location: [place];  Service: Gastroenterology;  Laterality: N/A;   ESOPHAGOGASTRODUODENOSCOPY (EGD) WITH PROPOFOL N/A 11/11/2019   Procedure: ESOPHAGOGASTRODUODENOSCOPY (EGD) WITH PROPOFOL;  Surgeon: Lin Landsman, MD;  Location: [facility] ENDOSCOPY;  Service: Gastroenterology;  Laterality: N/A;   TUMOR EXCISION  1989   same time as hysterectomy    Family History  Problem Relation Age of Onset   Diabetes Father 25       DM complications   Hypertension Mother 20       CKD   Thyroid disease Mother    Heart failure Mother    Kidney disease Mother    Depression Mother    Breast cancer Sister 34   Depression Sister    Kidney disease Other    Breast cancer Maternal Aunt    Breast cancer Cousin    Anuerysm Son 27   Diabetes Son     Social History   Tobacco Use   Smoking status: Never   Smokeless tobacco: Never   Tobacco comments:    smoking cessation materials not required  Substance Use Topics   Alcohol use: Never    Alcohol/week: 0.0 standard drinks of alcohol     Current Outpatient Medications:    Ensure (ENSURE), Take 237 mLs by mouth. Pt drinking approx 3 ensures per day, Disp: , Rfl:    levothyroxine (SYNTHROID) 100 MCG tablet, Take 1 tablet (100 mcg total) by mouth daily., Disp: 90 tablet, Rfl: 0   megestrol (MEGACE) 40 MG tablet, Take 1 tablet (40 mg total) by mouth daily. (Patient not taking: Reported on 03/13/2022), Disp: 30 tablet, Rfl: 2   mirtazapine (REMERON) 15 MG tablet, Take 15 mg by mouth at bedtime. (Patient not taking: Reported on 03/13/2022), Disp: , Rfl:    omeprazole (PRILOSEC) 40 MG capsule, Take 1 capsule (40 mg total) by mouth daily before breakfast. (Patient not taking: Reported on 03/13/2022), Disp: 90 capsule, Rfl: 3  Allergies  Allergen Reactions    Aspirin     Tinnitus   Citalopram Other (See Comments)   Codeine Nausea And Vomiting   Penicillins Rash    Has patient had a PCN reaction causing immediate rash, facial/tongue/throat swelling, SOB or lightheadedness with hypotension: Yes Has patient had a PCN reaction causing severe rash involving mucus membranes or skin necrosis: No Has patient had a PCN reaction that required hospitalization No Has patient had a PCN reaction occurring within the last 10 years: No If all of the above answers are "NO", then may proceed with Cephalosporin use.    I personally reviewed {Reviewed:14835} with the patient/caregiver today.   ROS  ***  Objective  There were no vitals filed for this visit.  There is no height or weight on file to calculate BMI.  Physical Exam ***  No results found  for this or any previous visit (from the past 2160 hour(s)).  Diabetic Foot Exam: Diabetic Foot Exam - Simple   No data filed    ***  PHQ2/9:    02/25/2022    1:58 PM 11/08/2021    2:22 PM 09/17/2021    3:00 PM 07/08/2021    2:25 PM 03/20/2021   11:07 AM  Depression screen PHQ 2/9  Decreased Interest 2 0 0 1 1  Down, Depressed, Hopeless 2 0 0 1 1  PHQ - 2 Score 4 0 0 2 2  Altered sleeping 0 0  0 0  Tired, decreased energy 2 0  1 3  Change in appetite 2 0  1 1  Feeling bad or failure about yourself  2 0  1 1  Trouble concentrating 2 0  0 0  Moving slowly or fidgety/restless 0 0  0 0  Suicidal thoughts 0 0  0 0  PHQ-9 Score 12 0  5 7  Difficult doing work/chores Somewhat difficult    Somewhat difficult    phq 9 is {gen pos [id number]} ***  Fall Risk:    02/25/2022    1:58 PM 11/08/2021    2:22 PM 09/17/2021    3:04 PM 07/08/2021    2:25 PM 03/20/2021   11:07 AM  Fall Risk   Falls in the past year? 0 0 0 0 0  Number falls in past yr:  0 0 0 0  Injury with Fall?  0 0 0 0  Risk for fall due to : No Fall Risks No Fall Risks No Fall Risks No Fall Risks   Follow up Falls prevention discussed  Falls prevention discussed Falls prevention discussed Falls prevention discussed Falls prevention discussed   ***   Functional Status Survey:   ***   Assessment & Plan  *** There are no diagnoses linked to this encounter.

## 2022-06-27 ENCOUNTER — Ambulatory Visit: Payer: Medicare HMO | Admitting: Family Medicine

## 2022-07-03 NOTE — Progress Notes (Deleted)
Name: Maureen [name]   MRN: [id number]    DOB: [birth year]   Date:07/03/2022       Progress Note  Subjective  Chief Complaint  Follow Up  HPI  Malnutrition: She has been evaluation by hematologist , gastroenterologist, psychiatrist and dietician . She continue to refuse to eat ,son states she no longer wants company for when she is eating. We referred her to palliative care has an appointment with her tomorrow. She told me she eats ice cream, corn bread, apple sauce and drinks Ensure, sometimes eats mash potatoes and candied yams. Her son gets frustrated because he thinks she is not putting enough effort into getting better and gaining weight. Explained to him she has an eating disorder and now is important to love and accept her choices, discussed with patient that [facility] has eating disorder sub-specialist and also in patient care but she needs to be willing to go .   Discussed megace to see if it would help with her appetite and she would like that . I also discussed hospice care   MDD: she was seen by Dr. Salvatore Decent - psychiatrist , last visit 05/2020 and has been released per patient/son's request, she does not want to take Remeron - afraid of polypharmacy. She was on clonazepam but has been off for months and has not noticed any difference in symptoms . Son states today she should go away and hide to not disappoint him. She continues to refuse seeing a psychiatrist or taking medications   Hypothyroidism: she has been taking medication daily now, 88 mcg daily , she was supposed to be up to 112 mcg daily , we switched from 88 mcg to  100 mcg  last visit and she states she has been complaint  . She continues to lose weight, has some dysphagia that is not secondary to thyroid, brittle nails and hair loss but likely from malnutrition    Paroxysmal Afib: she never started Eliquis, she states cardiologist called her for a visit but she did not schedule it. She denies chest pain or palpitation, she is afraid of  taking medications.Son is aware of risk of strokes . Unchanged   Atherosclerosis of aorta: found on CT chest , patient is not interested on statin therapy  Pancytopenia: she has been seeing Dr. Orlie Dakin, showed pancytopenia, last labs in Nov WBC down 2.6, HCT 35.1 and platelets 137 , she agrees on having labs done again. She missed appointment with Dr. Orlie Dakin yesterday   Vitamin D deficiency: she is refusing to take liquid vitamin D  Patient Active Problem List   Diagnosis Date Noted   Hyponatremia 09/24/2020   Protein-calorie malnutrition, severe 09/13/2020   Esophageal motility disorder    Palliative care by specialist    Advanced care planning/counseling discussion    Failure to thrive in adult 09/11/2020   Avoidant-restrictive food intake disorder (ARFID) 08/30/2020   Paroxysmal atrial fibrillation (HCC) 03/22/2020   Vitamin D deficiency 03/01/2020   Dysphagia    Arrhythmia 08/25/2019   Rectal bleeding    Low grade squamous intraepith lesion on cytologic smear cervix (lgsil) 08/04/2018   Chronic venous insufficiency 07/05/2018   Lymphedema 07/05/2018   Hypertension 04/02/2018   Swelling of limb 04/02/2018   Depression, major, recurrent, mild (HCC) 12/29/2017   Leukopenia 05/24/2017   Allergic rhinitis, seasonal 06/29/2015   Clavus 06/29/2015   1st degree AV block 06/29/2015   Hammer toe 06/29/2015   H/O: HTN (hypertension) 06/29/2015   Blood glucose elevated 06/29/2015  Floater, vitreous 06/29/2015   Bilateral tinnitus 06/29/2015   Hypothyroidism (acquired) 04/02/2015   Gastroesophageal reflux disease 04/02/2015   Hammer toe of right foot 04/02/2015   Scoliosis of thoracic spine 04/02/2015   Urethral prolapse 04/02/2015   Corn of toe 04/02/2015   Hyperlipidemia 04/02/2015   Varicose veins of both lower extremities 04/02/2015    Past Surgical History:  Procedure Laterality Date   ABDOMINAL HYSTERECTOMY  1989   APPENDECTOMY  1989   BREAST EXCISIONAL BIOPSY  Bilateral    multiple biopsies negative   BREAST SURGERY     Several   COLONOSCOPY WITH PROPOFOL N/A 07/28/2019   Procedure: COLONOSCOPY WITH PROPOFOL;  Surgeon: Lin Landsman, MD;  Location: [facility];  Service: Gastroenterology;  Laterality: N/A;   ESOPHAGOGASTRODUODENOSCOPY (EGD) WITH PROPOFOL N/A 11/11/2019   Procedure: ESOPHAGOGASTRODUODENOSCOPY (EGD) WITH PROPOFOL;  Surgeon: Lin Landsman, MD;  Location: [facility] ENDOSCOPY;  Service: Gastroenterology;  Laterality: N/A;   TUMOR EXCISION  1989   same time as hysterectomy    Family History  Problem Relation Age of Onset   Diabetes Father 63       DM complications   Hypertension Mother 85       CKD   Thyroid disease Mother    Heart failure Mother    Kidney disease Mother    Depression Mother    Breast cancer Sister 25   Depression Sister    Kidney disease Other    Breast cancer Maternal Aunt    Breast cancer Cousin    Anuerysm Son 42   Diabetes Son     Social History   Tobacco Use   Smoking status: Never   Smokeless tobacco: Never   Tobacco comments:    smoking cessation materials not required  Substance Use Topics   Alcohol use: Never    Alcohol/week: 0.0 standard drinks of alcohol     Current Outpatient Medications:    Ensure (ENSURE), Take 237 mLs by mouth. Pt drinking approx 3 ensures per day, Disp: , Rfl:    levothyroxine (SYNTHROID) 100 MCG tablet, Take 1 tablet (100 mcg total) by mouth daily., Disp: 90 tablet, Rfl: 0   megestrol (MEGACE) 40 MG tablet, Take 1 tablet (40 mg total) by mouth daily. (Patient not taking: Reported on 03/13/2022), Disp: 30 tablet, Rfl: 2   mirtazapine (REMERON) 15 MG tablet, Take 15 mg by mouth at bedtime. (Patient not taking: Reported on 03/13/2022), Disp: , Rfl:    omeprazole (PRILOSEC) 40 MG capsule, Take 1 capsule (40 mg total) by mouth daily before breakfast. (Patient not taking: Reported on 03/13/2022), Disp: 90 capsule, Rfl: 3  Allergies  Allergen Reactions    Aspirin     Tinnitus   Citalopram Other (See Comments)   Codeine Nausea And Vomiting   Penicillins Rash    Has patient had a PCN reaction causing immediate rash, facial/tongue/throat swelling, SOB or lightheadedness with hypotension: Yes Has patient had a PCN reaction causing severe rash involving mucus membranes or skin necrosis: No Has patient had a PCN reaction that required hospitalization No Has patient had a PCN reaction occurring within the last 10 years: No If all of the above answers are "NO", then may proceed with Cephalosporin use.    I personally reviewed active problem list, medication list, allergies, family history, social history, health maintenance with the patient/caregiver today.   ROS  ***  Objective  There were no vitals filed for this visit.  There is no height or weight on file  to calculate BMI.  Physical Exam ***  No results found for this or any previous visit (from the past 2160 hour(s)).   PHQ2/9:    02/25/2022    1:58 PM 11/08/2021    2:22 PM 09/17/2021    3:00 PM 07/08/2021    2:25 PM 03/20/2021   11:07 AM  Depression screen PHQ 2/9  Decreased Interest 2 0 0 1 1  Down, Depressed, Hopeless 2 0 0 1 1  PHQ - 2 Score 4 0 0 2 2  Altered sleeping 0 0  0 0  Tired, decreased energy 2 0  1 3  Change in appetite 2 0  1 1  Feeling bad or failure about yourself  2 0  1 1  Trouble concentrating 2 0  0 0  Moving slowly or fidgety/restless 0 0  0 0  Suicidal thoughts 0 0  0 0  PHQ-9 Score 12 0  5 7  Difficult doing work/chores Somewhat difficult    Somewhat difficult    phq 9 is {gen pos [id number]}   Fall Risk:    02/25/2022    1:58 PM 11/08/2021    2:22 PM 09/17/2021    3:04 PM 07/08/2021    2:25 PM 03/20/2021   11:07 AM  Fall Risk   Falls in the past year? 0 0 0 0 0  Number falls in past yr:  0 0 0 0  Injury with Fall?  0 0 0 0  Risk for fall due to : No Fall Risks No Fall Risks No Fall Risks No Fall Risks   Follow up Falls prevention  discussed Falls prevention discussed Falls prevention discussed Falls prevention discussed Falls prevention discussed      Functional Status Survey:      Assessment & Plan  *** There are no diagnoses linked to this encounter.

## 2022-07-04 ENCOUNTER — Ambulatory Visit: Payer: Medicare HMO | Admitting: Family Medicine

## 2022-07-04 NOTE — Progress Notes (Deleted)
Name: Maureen [name]   MRN: [id number]    DOB: 1946-09-27   Date:07/04/2022       Progress Note  Subjective  Chief Complaint  Follow Up  HPI  Malnutrition: She has been evaluation by hematologist , gastroenterologist, psychiatrist and dietician . She continue to refuse to eat ,son states she no longer wants company for when she is eating. We referred her to palliative care has an appointment with her tomorrow. She told me she eats ice cream, corn bread, apple sauce and drinks Ensure, sometimes eats mash potatoes and candied yams. Her son gets frustrated because he thinks she is not putting enough effort into getting better and gaining weight. Explained to him she has an eating disorder and now is important to love and accept her choices, discussed with patient that [facility] has eating disorder sub-specialist and also in patient care but she needs to be willing to go .   Discussed megace to see if it would help with her appetite and she would like that . I also discussed hospice care   MDD: she was seen by Dr. Salvatore Decent - psychiatrist , last visit 05/2020 and has been released per patient/son's request, she does not want to take Remeron - afraid of polypharmacy. She was on clonazepam but has been off for months and has not noticed any difference in symptoms . Son states today she should go away and hide to not disappoint him. She continues to refuse seeing a psychiatrist or taking medications   Hypothyroidism: she has been taking medication daily now, 88 mcg daily , she was supposed to be up to 112 mcg daily , we switched from 88 mcg to  100 mcg  last visit and she states she has been complaint  . She continues to lose weight, has some dysphagia that is not secondary to thyroid, brittle nails and hair loss but likely from malnutrition    Paroxysmal Afib: she never started Eliquis, she states cardiologist called her for a visit but she did not schedule it. She denies chest pain or palpitation, she is afraid of  taking medications.Son is aware of risk of strokes . Unchanged   Atherosclerosis of aorta: found on CT chest , patient is not interested on statin therapy  Pancytopenia: she has been seeing Dr. Orlie Dakin, showed pancytopenia, last labs in Nov WBC down 2.6, HCT 35.1 and platelets 137 , she agrees on having labs done again. She missed appointment with Dr. Orlie Dakin yesterday   Vitamin D deficiency: she is refusing to take liquid vitamin D  Patient Active Problem List   Diagnosis Date Noted   Hyponatremia 09/24/2020   Protein-calorie malnutrition, severe 09/13/2020   Esophageal motility disorder    Palliative care by specialist    Advanced care planning/counseling discussion    Failure to thrive in adult 09/11/2020   Avoidant-restrictive food intake disorder (ARFID) 08/30/2020   Paroxysmal atrial fibrillation (HCC) 03/22/2020   Vitamin D deficiency 03/01/2020   Dysphagia    Arrhythmia 08/25/2019   Rectal bleeding    Low grade squamous intraepith lesion on cytologic smear cervix (lgsil) 08/04/2018   Chronic venous insufficiency 07/05/2018   Lymphedema 07/05/2018   Hypertension 04/02/2018   Swelling of limb 04/02/2018   Depression, major, recurrent, mild (HCC) 12/29/2017   Leukopenia 05/24/2017   Allergic rhinitis, seasonal 06/29/2015   Clavus 06/29/2015   1st degree AV block 06/29/2015   Hammer toe 06/29/2015   H/O: HTN (hypertension) 06/29/2015   Blood glucose elevated 06/29/2015  Floater, vitreous 06/29/2015   Bilateral tinnitus 06/29/2015   Hypothyroidism (acquired) 04/02/2015   Gastroesophageal reflux disease 04/02/2015   Hammer toe of right foot 04/02/2015   Scoliosis of thoracic spine 04/02/2015   Urethral prolapse 04/02/2015   Corn of toe 04/02/2015   Hyperlipidemia 04/02/2015   Varicose veins of both lower extremities 04/02/2015    Past Surgical History:  Procedure Laterality Date   ABDOMINAL HYSTERECTOMY  1989   APPENDECTOMY  1989   BREAST EXCISIONAL BIOPSY  Bilateral    multiple biopsies negative   BREAST SURGERY     Several   COLONOSCOPY WITH PROPOFOL N/A 07/28/2019   Procedure: COLONOSCOPY WITH PROPOFOL;  Surgeon: Lin Landsman, MD;  Location: [facility];  Service: Gastroenterology;  Laterality: N/A;   ESOPHAGOGASTRODUODENOSCOPY (EGD) WITH PROPOFOL N/A 11/11/2019   Procedure: ESOPHAGOGASTRODUODENOSCOPY (EGD) WITH PROPOFOL;  Surgeon: Lin Landsman, MD;  Location: [facility] ENDOSCOPY;  Service: Gastroenterology;  Laterality: N/A;   TUMOR EXCISION  1989   same time as hysterectomy    Family History  Problem Relation Age of Onset   Diabetes Father 63       DM complications   Hypertension Mother 85       CKD   Thyroid disease Mother    Heart failure Mother    Kidney disease Mother    Depression Mother    Breast cancer Sister 25   Depression Sister    Kidney disease Other    Breast cancer Maternal Aunt    Breast cancer Cousin    Anuerysm Son 42   Diabetes Son     Social History   Tobacco Use   Smoking status: Never   Smokeless tobacco: Never   Tobacco comments:    smoking cessation materials not required  Substance Use Topics   Alcohol use: Never    Alcohol/week: 0.0 standard drinks of alcohol     Current Outpatient Medications:    Ensure (ENSURE), Take 237 mLs by mouth. Pt drinking approx 3 ensures per day, Disp: , Rfl:    levothyroxine (SYNTHROID) 100 MCG tablet, Take 1 tablet (100 mcg total) by mouth daily., Disp: 90 tablet, Rfl: 0   megestrol (MEGACE) 40 MG tablet, Take 1 tablet (40 mg total) by mouth daily. (Patient not taking: Reported on 03/13/2022), Disp: 30 tablet, Rfl: 2   mirtazapine (REMERON) 15 MG tablet, Take 15 mg by mouth at bedtime. (Patient not taking: Reported on 03/13/2022), Disp: , Rfl:    omeprazole (PRILOSEC) 40 MG capsule, Take 1 capsule (40 mg total) by mouth daily before breakfast. (Patient not taking: Reported on 03/13/2022), Disp: 90 capsule, Rfl: 3  Allergies  Allergen Reactions    Aspirin     Tinnitus   Citalopram Other (See Comments)   Codeine Nausea And Vomiting   Penicillins Rash    Has patient had a PCN reaction causing immediate rash, facial/tongue/throat swelling, SOB or lightheadedness with hypotension: Yes Has patient had a PCN reaction causing severe rash involving mucus membranes or skin necrosis: No Has patient had a PCN reaction that required hospitalization No Has patient had a PCN reaction occurring within the last 10 years: No If all of the above answers are "NO", then may proceed with Cephalosporin use.    I personally reviewed active problem list, medication list, allergies, family history, social history, health maintenance with the patient/caregiver today.   ROS  ***  Objective  There were no vitals filed for this visit.  There is no height or weight on file  to calculate BMI.  Physical Exam ***  No results found for this or any previous visit (from the past 2160 hour(s)).   PHQ2/9:    02/25/2022    1:58 PM 11/08/2021    2:22 PM 09/17/2021    3:00 PM 07/08/2021    2:25 PM 03/20/2021   11:07 AM  Depression screen PHQ 2/9  Decreased Interest 2 0 0 1 1  Down, Depressed, Hopeless 2 0 0 1 1  PHQ - 2 Score 4 0 0 2 2  Altered sleeping 0 0  0 0  Tired, decreased energy 2 0  1 3  Change in appetite 2 0  1 1  Feeling bad or failure about yourself  2 0  1 1  Trouble concentrating 2 0  0 0  Moving slowly or fidgety/restless 0 0  0 0  Suicidal thoughts 0 0  0 0  PHQ-9 Score 12 0  5 7  Difficult doing work/chores Somewhat difficult    Somewhat difficult    phq 9 is {gen pos [id number]}   Fall Risk:    02/25/2022    1:58 PM 11/08/2021    2:22 PM 09/17/2021    3:04 PM 07/08/2021    2:25 PM 03/20/2021   11:07 AM  Fall Risk   Falls in the past year? 0 0 0 0 0  Number falls in past yr:  0 0 0 0  Injury with Fall?  0 0 0 0  Risk for fall due to : No Fall Risks No Fall Risks No Fall Risks No Fall Risks   Follow up Falls prevention  discussed Falls prevention discussed Falls prevention discussed Falls prevention discussed Falls prevention discussed      Functional Status Survey:      Assessment & Plan  *** There are no diagnoses linked to this encounter.

## 2022-07-07 ENCOUNTER — Ambulatory Visit: Payer: Medicare HMO | Admitting: Family Medicine

## 2022-07-07 DIAGNOSIS — Z1231 Encounter for screening mammogram for malignant neoplasm of breast: Secondary | ICD-10-CM

## 2022-07-09 NOTE — Progress Notes (Deleted)
Name: Maureen [name]   MRN: [id number]    DOB: 09-04-1946   Date:07/09/2022       Progress Note  Subjective  Chief Complaint  Follow Up  HPI  Malnutrition: She has been evaluation by hematologist , gastroenterologist, psychiatrist and dietician . She continue to refuse to eat ,son states she no longer wants company for when she is eating. We referred her to palliative care has an appointment with her tomorrow. She told me she eats ice cream, corn bread, apple sauce and drinks Ensure, sometimes eats mash potatoes and candied yams. Her son gets frustrated because he thinks she is not putting enough effort into getting better and gaining weight. Explained to him she has an eating disorder and now is important to love and accept her choices, discussed with patient that [facility] has eating disorder sub-specialist and also in patient care but she needs to be willing to go .   Discussed megace to see if it would help with her appetite and she would like that . I also discussed hospice care   MDD: she was seen by Dr. Salvatore Decent - psychiatrist , last visit 05/2020 and has been released per patient/son's request, she does not want to take Remeron - afraid of polypharmacy. She was on clonazepam but has been off for months and has not noticed any difference in symptoms . Son states today she should go away and hide to not disappoint him. She continues to refuse seeing a psychiatrist or taking medications   Hypothyroidism: she has been taking medication daily now, 88 mcg daily , she was supposed to be up to 112 mcg daily , we switched from 88 mcg to  100 mcg  last visit and she states she has been complaint  . She continues to lose weight, has some dysphagia that is not secondary to thyroid, brittle nails and hair loss but likely from malnutrition    Paroxysmal Afib: she never started Eliquis, she states cardiologist called her for a visit but she did not schedule it. She denies chest pain or palpitation, she is afraid  of taking medications.Son is aware of risk of strokes . Unchanged   Atherosclerosis of aorta: found on CT chest , patient is not interested on statin therapy  Pancytopenia: she has been seeing Dr. Orlie Dakin, showed pancytopenia, last labs in Nov WBC down 2.6, HCT 35.1 and platelets 137 , she agrees on having labs done again. She missed appointment with Dr. Orlie Dakin yesterday   Vitamin D deficiency: she is refusing to take liquid vitamin D  Patient Active Problem List   Diagnosis Date Noted   Hyponatremia 09/24/2020   Protein-calorie malnutrition, severe 09/13/2020   Esophageal motility disorder    Palliative care by specialist    Advanced care planning/counseling discussion    Failure to thrive in adult 09/11/2020   Avoidant-restrictive food intake disorder (ARFID) 08/30/2020   Paroxysmal atrial fibrillation (HCC) 03/22/2020   Vitamin D deficiency 03/01/2020   Dysphagia    Arrhythmia 08/25/2019   Rectal bleeding    Low grade squamous intraepith lesion on cytologic smear cervix (lgsil) 08/04/2018   Chronic venous insufficiency 07/05/2018   Lymphedema 07/05/2018   Hypertension 04/02/2018   Swelling of limb 04/02/2018   Depression, major, recurrent, mild (HCC) 12/29/2017   Leukopenia 05/24/2017   Allergic rhinitis, seasonal 06/29/2015   Clavus 06/29/2015   1st degree AV block 06/29/2015   Hammer toe 06/29/2015   H/O: HTN (hypertension) 06/29/2015   Blood glucose elevated 06/29/2015  Floater, vitreous 06/29/2015   Bilateral tinnitus 06/29/2015   Hypothyroidism (acquired) 04/02/2015   Gastroesophageal reflux disease 04/02/2015   Hammer toe of right foot 04/02/2015   Scoliosis of thoracic spine 04/02/2015   Urethral prolapse 04/02/2015   Corn of toe 04/02/2015   Hyperlipidemia 04/02/2015   Varicose veins of both lower extremities 04/02/2015    Past Surgical History:  Procedure Laterality Date   ABDOMINAL HYSTERECTOMY  1989   APPENDECTOMY  1989   BREAST EXCISIONAL BIOPSY  Bilateral    multiple biopsies negative   BREAST SURGERY     Several   COLONOSCOPY WITH PROPOFOL N/A 07/28/2019   Procedure: COLONOSCOPY WITH PROPOFOL;  Surgeon: Lin Landsman, MD;  Location: [facility];  Service: Gastroenterology;  Laterality: N/A;   ESOPHAGOGASTRODUODENOSCOPY (EGD) WITH PROPOFOL N/A 11/11/2019   Procedure: ESOPHAGOGASTRODUODENOSCOPY (EGD) WITH PROPOFOL;  Surgeon: Lin Landsman, MD;  Location: [facility];  Service: Gastroenterology;  Laterality: N/A;   TUMOR EXCISION  1989   same time as hysterectomy    Family History  Problem Relation Age of Onset   Diabetes Father 63       DM complications   Hypertension Mother 85       CKD   Thyroid disease Mother    Heart failure Mother    Kidney disease Mother    Depression Mother    Breast cancer Sister 25   Depression Sister    Kidney disease Other    Breast cancer Maternal Aunt    Breast cancer Cousin    Anuerysm Son 42   Diabetes Son     Social History   Tobacco Use   Smoking status: Never   Smokeless tobacco: Never   Tobacco comments:    smoking cessation materials not required  Substance Use Topics   Alcohol use: Never    Alcohol/week: 0.0 standard drinks of alcohol     Current Outpatient Medications:    Ensure (ENSURE), Take 237 mLs by mouth. Pt drinking approx 3 ensures per day, Disp: , Rfl:    levothyroxine (SYNTHROID) 100 MCG tablet, Take 1 tablet (100 mcg total) by mouth daily., Disp: 90 tablet, Rfl: 0   megestrol (MEGACE) 40 MG tablet, Take 1 tablet (40 mg total) by mouth daily. (Patient not taking: Reported on 03/13/2022), Disp: 30 tablet, Rfl: 2   mirtazapine (REMERON) 15 MG tablet, Take 15 mg by mouth at bedtime. (Patient not taking: Reported on 03/13/2022), Disp: , Rfl:    omeprazole (PRILOSEC) 40 MG capsule, Take 1 capsule (40 mg total) by mouth daily before breakfast. (Patient not taking: Reported on 03/13/2022), Disp: 90 capsule, Rfl: 3  Allergies  Allergen Reactions    Aspirin     Tinnitus   Citalopram Other (See Comments)   Codeine Nausea And Vomiting   Penicillins Rash    Has patient had a PCN reaction causing immediate rash, facial/tongue/throat swelling, SOB or lightheadedness with hypotension: Yes Has patient had a PCN reaction causing severe rash involving mucus membranes or skin necrosis: No Has patient had a PCN reaction that required hospitalization No Has patient had a PCN reaction occurring within the last 10 years: No If all of the above answers are "NO", then may proceed with Cephalosporin use.    I personally reviewed active problem list, medication list, allergies, family history, social history, health maintenance with the patient/caregiver today.   ROS  ***  Objective  There were no vitals filed for this visit.  There is no height or weight on file  to calculate BMI.  Physical Exam ***  No results found for this or any previous visit (from the past 2160 hour(s)).   PHQ2/9:    02/25/2022    1:58 PM 11/08/2021    2:22 PM 09/17/2021    3:00 PM 07/08/2021    2:25 PM 03/20/2021   11:07 AM  Depression screen PHQ 2/9  Decreased Interest 2 0 0 1 1  Down, Depressed, Hopeless 2 0 0 1 1  PHQ - 2 Score 4 0 0 2 2  Altered sleeping 0 0  0 0  Tired, decreased energy 2 0  1 3  Change in appetite 2 0  1 1  Feeling bad or failure about yourself  2 0  1 1  Trouble concentrating 2 0  0 0  Moving slowly or fidgety/restless 0 0  0 0  Suicidal thoughts 0 0  0 0  PHQ-9 Score 12 0  5 7  Difficult doing work/chores Somewhat difficult    Somewhat difficult    phq 9 is {gen pos [id number]}   Fall Risk:    02/25/2022    1:58 PM 11/08/2021    2:22 PM 09/17/2021    3:04 PM 07/08/2021    2:25 PM 03/20/2021   11:07 AM  Fall Risk   Falls in the past year? 0 0 0 0 0  Number falls in past yr:  0 0 0 0  Injury with Fall?  0 0 0 0  Risk for fall due to : No Fall Risks No Fall Risks No Fall Risks No Fall Risks   Follow up Falls prevention  discussed Falls prevention discussed Falls prevention discussed Falls prevention discussed Falls prevention discussed      Functional Status Survey:      Assessment & Plan  *** There are no diagnoses linked to this encounter.

## 2022-07-10 ENCOUNTER — Ambulatory Visit: Payer: Medicare HMO | Admitting: Family Medicine

## 2022-07-29 NOTE — Progress Notes (Deleted)
Name: Maureen [name]   MRN: [id number]    DOB: 12-16-1945   Date:07/29/2022       Progress Note  Subjective  Chief Complaint  Follow Up  HPI  Malnutrition: She has been evaluation by hematologist , gastroenterologist, psychiatrist and dietician . She continue to refuse to eat ,son states she no longer wants company for when she is eating. We referred her to palliative care has an appointment with her tomorrow. She told me she eats ice cream, corn bread, apple sauce and drinks Ensure, sometimes eats mash potatoes and candied yams. Her son gets frustrated because he thinks she is not putting enough effort into getting better and gaining weight. Explained to him she has an eating disorder and now is important to love and accept her choices, discussed with patient that [facility] has eating disorder sub-specialist and also in patient care but she needs to be willing to go .   Discussed megace to see if it would help with her appetite and she would like that . I also discussed hospice care   MDD: she was seen by Dr. Susette Racer - psychiatrist , last visit 05/2020 and has been released per patient/son's request, she does not want to take Remeron - afraid of polypharmacy. She was on clonazepam but has been off for months and has not noticed any difference in symptoms . Son states today she should go away and hide to not disappoint him. She continues to refuse seeing a psychiatrist or taking medications   Hypothyroidism: she has been taking medication daily now, 88 mcg daily , she was supposed to be up to 112 mcg daily , we switched from 88 mcg to  100 mcg  last visit and she states she has been complaint  . She continues to lose weight, has some dysphagia that is not secondary to thyroid, brittle nails and hair loss but likely from malnutrition    Paroxysmal Afib: she never started Eliquis, she states cardiologist called her for a visit but she did not schedule it. She denies chest pain or palpitation, she is afraid  of taking medications.Son is aware of risk of strokes . Unchanged   Atherosclerosis of aorta: found on CT chest , patient is not interested on statin therapy  Pancytopenia: she has been seeing Dr. Grayland Ormond, showed pancytopenia, last labs in Nov WBC down 2.6, HCT 35.1 and platelets 137 , she agrees on having labs done again. She missed appointment with Dr. Grayland Ormond yesterday   Vitamin D deficiency: she is refusing to take liquid vitamin D  Patient Active Problem List   Diagnosis Date Noted   Hyponatremia 09/24/2020   Protein-calorie malnutrition, severe 09/13/2020   Esophageal motility disorder    Palliative care by specialist    Advanced care planning/counseling discussion    Failure to thrive in adult 09/11/2020   Avoidant-restrictive food intake disorder (ARFID) 08/30/2020   Paroxysmal atrial fibrillation ([place]) 03/22/2020   Vitamin D deficiency 03/01/2020   Dysphagia    Arrhythmia 08/25/2019   Rectal bleeding    Low grade squamous intraepith lesion on cytologic smear cervix (lgsil) 08/04/2018   Chronic venous insufficiency 07/05/2018   Lymphedema 07/05/2018   Hypertension 04/02/2018   Swelling of limb 04/02/2018   Depression, major, recurrent, mild ([place]) 12/29/2017   Leukopenia 05/24/2017   Allergic rhinitis, seasonal 06/29/2015   Clavus 06/29/2015   1st degree AV block 06/29/2015   Hammer toe 06/29/2015   H/O: HTN (hypertension) 06/29/2015   Blood glucose elevated 06/29/2015  Floater, vitreous 06/29/2015   Bilateral tinnitus 06/29/2015   Hypothyroidism (acquired) 04/02/2015   Gastroesophageal reflux disease 04/02/2015   Hammer toe of right foot 04/02/2015   Scoliosis of thoracic spine 04/02/2015   Urethral prolapse 04/02/2015   Corn of toe 04/02/2015   Hyperlipidemia 04/02/2015   Varicose veins of both lower extremities 04/02/2015    Past Surgical History:  Procedure Laterality Date   ABDOMINAL HYSTERECTOMY  1989   APPENDECTOMY  1989   BREAST EXCISIONAL BIOPSY  Bilateral    multiple biopsies negative   BREAST SURGERY     Several   COLONOSCOPY WITH PROPOFOL N/A 07/28/2019   Procedure: COLONOSCOPY WITH PROPOFOL;  Surgeon: Lin Landsman, MD;  Location: [facility];  Service: Gastroenterology;  Laterality: N/A;   ESOPHAGOGASTRODUODENOSCOPY (EGD) WITH PROPOFOL N/A 11/11/2019   Procedure: ESOPHAGOGASTRODUODENOSCOPY (EGD) WITH PROPOFOL;  Surgeon: Lin Landsman, MD;  Location: [facility] END[facility];  Service: Gastroenterology;  Laterality: N/A;   TUMOR EXCISION  1989   same time as hysterectomy    Family History  Problem Relation Age of Onset   Diabetes Father 63       DM complications   Hypertension Mother 85       CKD   Thyroid disease Mother    Heart failure Mother    Kidney disease Mother    Depression Mother    Breast cancer Sister 25   Depression Sister    Kidney disease Other    Breast cancer Maternal Aunt    Breast cancer Cousin    Anuerysm Son 42   Diabetes Son     Social History   Tobacco Use   Smoking status: Never   Smokeless tobacco: Never   Tobacco comments:    smoking cessation materials not required  Substance Use Topics   Alcohol use: Never    Alcohol/week: 0.0 standard drinks of alcohol     Current Outpatient Medications:    Ensure (ENSURE), Take 237 mLs by mouth. Pt drinking approx 3 ensures per day, Disp: , Rfl:    levothyroxine (SYNTHROID) 100 MCG tablet, Take 1 tablet (100 mcg total) by mouth daily., Disp: 90 tablet, Rfl: 0   megestrol (MEGACE) 40 MG tablet, Take 1 tablet (40 mg total) by mouth daily. (Patient not taking: Reported on 03/13/2022), Disp: 30 tablet, Rfl: 2   mirtazapine (REMERON) 15 MG tablet, Take 15 mg by mouth at bedtime. (Patient not taking: Reported on 03/13/2022), Disp: , Rfl:    omeprazole (PRILOSEC) 40 MG capsule, Take 1 capsule (40 mg total) by mouth daily before breakfast. (Patient not taking: Reported on 03/13/2022), Disp: 90 capsule, Rfl: 3  Allergies  Allergen Reactions    Aspirin     Tinnitus   Citalopram Other (See Comments)   Codeine Nausea And Vomiting   Penicillins Rash    Has patient had a PCN reaction causing immediate rash, facial/tongue/throat swelling, SOB or lightheadedness with hypotension: Yes Has patient had a PCN reaction causing severe rash involving mucus membranes or skin necrosis: No Has patient had a PCN reaction that required hospitalization No Has patient had a PCN reaction occurring within the last 10 years: No If all of the above answers are "NO", then may proceed with Cephalosporin use.    I personally reviewed active problem list, medication list, allergies, family history, social history, health maintenance with the patient/caregiver today.   ROS  ***  Objective  There were no vitals filed for this visit.  There is no height or weight on file  to calculate BMI.  Physical Exam ***  No results found for this or any previous visit (from the past 2160 hour(s)).   PHQ2/9:    02/25/2022    1:58 PM 11/08/2021    2:22 PM 09/17/2021    3:00 PM 07/08/2021    2:25 PM 03/20/2021   11:07 AM  Depression screen PHQ 2/9  Decreased Interest 2 0 0 1 1  Down, Depressed, Hopeless 2 0 0 1 1  PHQ - 2 Score 4 0 0 2 2  Altered sleeping 0 0  0 0  Tired, decreased energy 2 0  1 3  Change in appetite 2 0  1 1  Feeling bad or failure about yourself  2 0  1 1  Trouble concentrating 2 0  0 0  Moving slowly or fidgety/restless 0 0  0 0  Suicidal thoughts 0 0  0 0  PHQ-9 Score 12 0  5 7  Difficult doing work/chores Somewhat difficult    Somewhat difficult    phq 9 is {gen pos [id number]}   Fall Risk:    02/25/2022    1:58 PM 11/08/2021    2:22 PM 09/17/2021    3:04 PM 07/08/2021    2:25 PM 03/20/2021   11:07 AM  Fall Risk   Falls in the past year? 0 0 0 0 0  Number falls in past yr:  0 0 0 0  Injury with Fall?  0 0 0 0  Risk for fall due to : No Fall Risks No Fall Risks No Fall Risks No Fall Risks   Follow up Falls prevention  discussed Falls prevention discussed Falls prevention discussed Falls prevention discussed Falls prevention discussed      Functional Status Survey:      Assessment & Plan  *** There are no diagnoses linked to this encounter.

## 2022-07-30 ENCOUNTER — Ambulatory Visit: Payer: Medicare HMO | Admitting: Family Medicine

## 2022-08-25 ENCOUNTER — Encounter: Payer: Self-pay | Admitting: Family Medicine

## 2022-08-25 ENCOUNTER — Ambulatory Visit (INDEPENDENT_AMBULATORY_CARE_PROVIDER_SITE_OTHER): Payer: Medicare HMO | Admitting: Family Medicine

## 2022-08-25 VITALS — BP 92/72 | HR 89 | Temp 98.5°F | Resp 12 | Ht 60.0 in | Wt 70.7 lb

## 2022-08-25 DIAGNOSIS — E43 Unspecified severe protein-calorie malnutrition: Secondary | ICD-10-CM | POA: Diagnosis not present

## 2022-08-25 DIAGNOSIS — E875 Hyperkalemia: Secondary | ICD-10-CM | POA: Diagnosis not present

## 2022-08-25 DIAGNOSIS — R1319 Other dysphagia: Secondary | ICD-10-CM | POA: Diagnosis not present

## 2022-08-25 DIAGNOSIS — E559 Vitamin D deficiency, unspecified: Secondary | ICD-10-CM

## 2022-08-25 DIAGNOSIS — I480 Paroxysmal atrial fibrillation: Secondary | ICD-10-CM

## 2022-08-25 DIAGNOSIS — D61818 Other pancytopenia: Secondary | ICD-10-CM | POA: Diagnosis not present

## 2022-08-25 DIAGNOSIS — E039 Hypothyroidism, unspecified: Secondary | ICD-10-CM

## 2022-08-25 DIAGNOSIS — F331 Major depressive disorder, recurrent, moderate: Secondary | ICD-10-CM | POA: Diagnosis not present

## 2022-08-25 DIAGNOSIS — Z1231 Encounter for screening mammogram for malignant neoplasm of breast: Secondary | ICD-10-CM

## 2022-08-25 DIAGNOSIS — F5082 Avoidant/restrictive food intake disorder: Secondary | ICD-10-CM | POA: Diagnosis not present

## 2022-08-25 DIAGNOSIS — I700 Atherosclerosis of aorta: Secondary | ICD-10-CM

## 2022-08-25 MED ORDER — OMEPRAZOLE 40 MG PO CPDR: 40.0000 mg | DELAYED_RELEASE_CAPSULE | Freq: Every day | ORAL | 3 refills | Status: AC

## 2022-08-25 NOTE — Progress Notes (Signed)
Name: Maureen [name]   MRN: [id number]    DOB: 12/04/1945   Date:08/25/2022       Progress Note  Subjective  Chief Complaint  Follow up   HPI  Malnutrition: She has been evaluation by hematologist , gastroenterologist, psychiatrist and dietician . She continue to refuse to eat ,son states she no longer wants company for when she is eating. We referred her to palliative care and she had some home health that helped her eat but service ended months ago and son is asking if could be re-started. Her son continues to get frustrated with her, explained at this point he needs to offer her a variety of foods, snacks and let her chose what and when to eat, she has a component of eating disorder and also dysphagia. Asked him to talk to her about other things besides food   MDD: she was seen by Dr. Susette Racer - psychiatrist , last visit 05/2020 and has been released per patient/son's request, she does not want to take Remeron - afraid of polypharmacy. She was on clonazepam but has been off for months and has not noticed any difference in symptoms. She also stopped megace and Trazodone.    Hypothyroidism: she has been taking medication daily now, 88 mcg daily , she was supposed to be up to 112 mcg daily , we switched from 88 mcg to  100 mcg  last visit and she states she has been complaint but last TSH was still not at goal, not sure if compliant, we will recheck labs today again   . She continues to lose weight, has some dysphagia that is not secondary to thyroid, brittle nails and hair loss but likely from malnutrition we will recheck TSH today and adjust dose if still off   Paroxysmal Afib: she never started Eliquis, she states cardiologist called her for a visit but she did not schedule it. She denies chest pain or palpitation, she is afraid of taking medications.Son is aware of risk of strokes .Unchanged    Atherosclerosis of aorta: found on CT chest , patient is not interested on statin therapy,.    Pancytopenia: under the care of Dr. Grayland Ormond but skips appointments at times, reviewed her last labs   Vitamin D deficiency: she continues to refuse taking it Even the liquid form    Patient Active Problem List   Diagnosis Date Noted   Hyponatremia 09/24/2020   Protein-calorie malnutrition, severe 09/13/2020   Esophageal motility disorder    Palliative care by specialist    Advanced care planning/counseling discussion    Failure to thrive in adult 09/11/2020   Avoidant-restrictive food intake disorder (ARFID) 08/30/2020   Paroxysmal atrial fibrillation ([place]) 03/22/2020   Vitamin D deficiency 03/01/2020   Dysphagia    Arrhythmia 08/25/2019   Rectal bleeding    Low grade squamous intraepith lesion on cytologic smear cervix (lgsil) 08/04/2018   Chronic venous insufficiency 07/05/2018   Lymphedema 07/05/2018   Hypertension 04/02/2018   Swelling of limb 04/02/2018   Depression, major, recurrent, mild ([place]) 12/29/2017   Leukopenia 05/24/2017   Allergic rhinitis, seasonal 06/29/2015   Clavus 06/29/2015   1st degree AV block 06/29/2015   Hammer toe 06/29/2015   H/O: HTN (hypertension) 06/29/2015   Blood glucose elevated 06/29/2015   Floater, vitreous 06/29/2015   Bilateral tinnitus 06/29/2015   Hypothyroidism (acquired) 04/02/2015   Gastroesophageal reflux disease 04/02/2015   Hammer toe of right foot 04/02/2015   Scoliosis of thoracic spine 04/02/2015   Urethral  prolapse 04/02/2015   Corn of toe 04/02/2015   Hyperlipidemia 04/02/2015   Varicose veins of both lower extremities 04/02/2015    Past Surgical History:  Procedure Laterality Date   ABDOMINAL HYSTERECTOMY  1989   APPENDECTOMY  1989   BREAST EXCISIONAL BIOPSY Bilateral    multiple biopsies negative   BREAST SURGERY     Several   COLONOSCOPY WITH PROPOFOL N/A 07/28/2019   Procedure: COLONOSCOPY WITH PROPOFOL;  Surgeon: Toney Reil, MD;  Location: [facility] ENDOSCOPY;  Service: Gastroenterology;  Laterality:  N/A;   ESOPHAGOGASTRODUODENOSCOPY (EGD) WITH PROPOFOL N/A 11/11/2019   Procedure: ESOPHAGOGASTRODUODENOSCOPY (EGD) WITH PROPOFOL;  Surgeon: Toney Reil, MD;  Location: [facility] ENDOSCOPY;  Service: Gastroenterology;  Laterality: N/A;   TUMOR EXCISION  1989   same time as hysterectomy    Family History  Problem Relation Age of Onset   Diabetes Father 49       DM complications   Hypertension Mother 94       CKD   Thyroid disease Mother    Heart failure Mother    Kidney disease Mother    Depression Mother    Breast cancer Sister 25   Depression Sister    Kidney disease Other    Breast cancer Maternal Aunt    Breast cancer Cousin    Anuerysm Son 61   Diabetes Son     Social History   Tobacco Use   Smoking status: Never   Smokeless tobacco: Never   Tobacco comments:    smoking cessation materials not required  Substance Use Topics   Alcohol use: Never    Alcohol/week: 0.0 standard drinks of alcohol     Current Outpatient Medications:    Ensure (ENSURE), Take 237 mLs by mouth. Pt drinking approx 3 ensures per day, Disp: , Rfl:    levothyroxine (SYNTHROID) 100 MCG tablet, Take 1 tablet (100 mcg total) by mouth daily., Disp: 90 tablet, Rfl: 0   omeprazole (PRILOSEC) 40 MG capsule, Take 1 capsule (40 mg total) by mouth daily before breakfast., Disp: 90 capsule, Rfl: 3  Allergies  Allergen Reactions   Aspirin     Tinnitus   Citalopram Other (See Comments)   Codeine Nausea And Vomiting   Penicillins Rash    Has patient had a PCN reaction causing immediate rash, facial/tongue/throat swelling, SOB or lightheadedness with hypotension: Yes Has patient had a PCN reaction causing severe rash involving mucus membranes or skin necrosis: No Has patient had a PCN reaction that required hospitalization No Has patient had a PCN reaction occurring within the last 10 years: No If all of the above answers are "NO", then may proceed with Cephalosporin use.    I personally reviewed  active problem list, medication list, allergies, family history with the patient/caregiver today.   ROS  Ten systems reviewed and is negative except as mentioned in HPI   Objective  Vitals:   08/25/22 1544  BP: 92/72  Pulse: 89  Resp: 12  Temp: 98.5 F (36.9 C)  TempSrc: Oral  SpO2: 98%  Weight: 70 lb 11.2 oz (32.1 kg)  Height: 5' (1.524 m)    Body mass index is 13.81 kg/m.  Physical Exam  Constitutional: Patient appears malnourished,  with temporal waisting No distress.  HEENT: head atraumatic, normocephalic, pupils equal and reactive to light, neck supple Cardiovascular: Normal rate, regular rhythm and normal heart sounds.  No murmur heard. No BLE edema. Pulmonary/Chest: Effort normal and breath sounds normal. No respiratory distress. Abdominal: Soft.  There is no tenderness. Psychiatric: Patient has a normal mood and affect. behavior is normal. Judgment and thought content normal.   PHQ2/9:    02/25/2022    1:58 PM 11/08/2021    2:22 PM 09/17/2021    3:00 PM 07/08/2021    2:25 PM 03/20/2021   11:07 AM  Depression screen PHQ 2/9  Decreased Interest 2 0 0 1 1  Down, Depressed, Hopeless 2 0 0 1 1  PHQ - 2 Score 4 0 0 2 2  Altered sleeping 0 0  0 0  Tired, decreased energy 2 0  1 3  Change in appetite 2 0  1 1  Feeling bad or failure about yourself  2 0  1 1  Trouble concentrating 2 0  0 0  Moving slowly or fidgety/restless 0 0  0 0  Suicidal thoughts 0 0  0 0  PHQ-9 Score 12 0  5 7  Difficult doing work/chores Somewhat difficult    Somewhat difficult    Son filled out the screen questions   Fall Risk:    08/25/2022    3:51 PM 02/25/2022    1:58 PM 11/08/2021    2:22 PM 09/17/2021    3:04 PM 07/08/2021    2:25 PM  Fall Risk   Falls in the past year? 0 0 0 0 0  Number falls in past yr:   0 0 0  Injury with Fall?   0 0 0  Risk for fall due to : No Fall Risks No Fall Risks No Fall Risks No Fall Risks No Fall Risks  Follow up Falls prevention  discussed;Education provided;Falls evaluation completed Falls prevention discussed Falls prevention discussed Falls prevention discussed Falls prevention discussed     Functional Status Survey: Is the patient deaf or have difficulty hearing?: No Does the patient have difficulty seeing, even when wearing glasses/contacts?: No Does the patient have difficulty concentrating, remembering, or making decisions?: Yes Does the patient have difficulty walking or climbing stairs?: No Does the patient have difficulty dressing or bathing?: No Does the patient have difficulty doing errands alone such as visiting a doctor's office or shopping?: Yes    Assessment & Plan  1. Severe protein-energy malnutrition (HCC)  - Ambulatory referral to Home Health  2. Pancytopenia (HCC)  Discussed follow up with Dr. Orlie Dakin   3. Paroxysmal atrial fibrillation (HCC)  Rate controlled without medication  4. Atherosclerosis of aorta (HCC)  Refuses statin therapy  5. Moderate episode of recurrent major depressive disorder (HCC)  Refuses medications   6. Esophageal dysphagia  - omeprazole (PRILOSEC) 40 MG capsule; Take 1 capsule (40 mg total) by mouth daily before breakfast.  Dispense: 90 capsule; Refill: 3 - AMB Referral to [facility] Coordinaton - Ambulatory referral to Home Health  7. Adult hypothyroidism  - TSH - Ambulatory referral to Home Health  8. Hyperkalemia  - Potassium  9. Avoidant-restrictive food intake disorder (ARFID)  We placed referral to home health again   10. Breast cancer screening by mammogram  - MM Digital Screening; Future  11. Vitamin D deficiency

## 2022-08-26 ENCOUNTER — Telehealth: Payer: Self-pay

## 2022-08-26 ENCOUNTER — Telehealth: Payer: Self-pay | Admitting: *Deleted

## 2022-08-26 LAB — POTASSIUM: Potassium: 4.3 mmol/L (ref 3.5–5.3)

## 2022-08-26 LAB — TSH: TSH: 21.6 mIU/L — ABNORMAL HIGH (ref 0.40–4.50)

## 2022-08-26 NOTE — Telephone Encounter (Signed)
   Telephone encounter was:  Unsuccessful.  08/26/2022 Name: Maureen [name] MRN: [id number] DOB: Jan 09, 1946  Unsuccessful outbound call made today to assist with:   Medicaid information.  Outreach Attempt:  1st Attempt  A HIPAA compliant voice message was left requesting a return call.  Instructed patient to call back at [phone].  [place] Resource Care Guide   [email or web address]  ?? [phone]   Website: [email or web address]  [email or web address]

## 2022-08-26 NOTE — Chronic Care Management (AMB) (Signed)
  Care Coordination  Outreach Note  08/26/2022 Name: LAYNE LEBON MRN: [id number] DOB: June 06, 1946   Care Coordination Outreach Attempts: An unsuccessful telephone outreach was attempted today to offer the patient information about available care coordination services as a benefit of their health plan.   Referral received   Follow Up Plan:  Additional outreach attempts will be made to offer the patient care coordination information and services.   Encounter Outcome:  No Answer  Julian Hy, [place] Direct Dial: [phone]

## 2022-08-27 ENCOUNTER — Telehealth: Payer: Self-pay

## 2022-08-27 NOTE — Telephone Encounter (Signed)
   Telephone encounter was:  Unsuccessful.  08/27/2022 Name: Maureen [name] MRN: [id number] DOB: Dec 24, 1945  Unsuccessful outbound call made today to assist with:   Medicaid information.  Outreach Attempt:  2nd Attempt  A HIPAA compliant voice message was left requesting a return call.  Instructed patient to call back at [phone].  [place] Resource Care Guide   [name]  ?? [phone]   Website: [email or web address]  [email or web address]

## 2022-08-28 ENCOUNTER — Other Ambulatory Visit: Payer: Self-pay | Admitting: Family Medicine

## 2022-08-28 ENCOUNTER — Telehealth: Payer: Self-pay

## 2022-08-28 NOTE — Telephone Encounter (Signed)
   Telephone encounter was:  Unsuccessful.  08/28/2022 Name: Maureen [name] MRN: [id number] DOB: 12/01/1945  Unsuccessful outbound call made today to assist with:   Medicaid information.  Outreach Attempt:  3rd Attempt.  Referral closed unable to contact patient.  A HIPAA compliant voice message was left requesting a return call.  Instructed patient to call back at [phone].  [place] Resource Care Guide   [email or web address]  ?? [phone]   Website: [email or web address]  [email or web address]

## 2022-09-01 NOTE — Progress Notes (Signed)
  Care Coordination  Outreach Note  09/01/2022 Name: Maureen [name] MRN: [id number] DOB: [birth year]   Care Coordination Outreach Attempts: A second unsuccessful outreach was attempted today to offer the patient with information about available care coordination services as a benefit of their health plan.     Referral received   Follow Up Plan:  Additional outreach attempts will be made to offer the patient care coordination information and services.   Encounter Outcome:  No Answer  Julian Hy, [place] Direct Dial: [phone]

## 2022-09-02 ENCOUNTER — Ambulatory Visit: Payer: Medicare HMO | Admitting: Family Medicine

## 2022-09-05 ENCOUNTER — Other Ambulatory Visit: Payer: Self-pay | Admitting: Family Medicine

## 2022-09-05 DIAGNOSIS — E039 Hypothyroidism, unspecified: Secondary | ICD-10-CM

## 2022-09-05 NOTE — Telephone Encounter (Signed)
Per Dr. Carlynn Purl: I called and lvm to confirm if she has any doses at home due to non-compliance.

## 2022-09-05 NOTE — Telephone Encounter (Signed)
Requested medication (s) are due for refill today: yes  Requested medication (s) are on the active medication list: yes  Last refill:  11/08/21 #90 0 refills  Future visit scheduled: no  Notes to clinic:  no refills remain, last TSH 08/18/22 21.60 (h). Do you want to refill Rx?     Requested Prescriptions  Pending Prescriptions Disp Refills   levothyroxine (SYNTHROID) 100 MCG tablet 90 tablet 0    Sig: Take 1 tablet (100 mcg total) by mouth daily.     Endocrinology:  Hypothyroid Agents Failed - 09/05/2022 12:26 PM      Failed - TSH in normal range and within 360 days    TSH  Date Value Ref Range Status  08/25/2022 21.60 (H) 0.40 - 4.50 mIU/L Final         Passed - Valid encounter within last 12 months    Recent Outpatient Visits           1 week ago Severe protein-energy malnutrition [facility])   [facility] Alba Cory, MD   6 months ago Pancytopenia [facility])   [facility] Alba Cory, MD   10 months ago Severe protein-energy malnutrition [facility])   [facility] Alba Cory, MD   1 year ago Hypothyroidism (acquired)   [facility] Alba Cory, MD   1 year ago Paroxysmal atrial fibrillation [facility])   [facility] Alba Cory, MD

## 2022-09-05 NOTE — Telephone Encounter (Signed)
Medication Refill - Medication: levothyroxine (SYNTHROID) 100 MCG tablet Has the patient contacted their pharmacy? yes  (Agent: If no, request that the patient contact the pharmacy for the refill. If patient does not wish to contact the pharmacy document the reason why and proceed with request.) (Agent: If yes, when and what did the pharmacy advise?)contact pcp  Preferred Pharmacy (with phone number or street name): [facility] [address], [place] - [address] [place] [address] Phone: [phone]  Fax: [phone]  Has the patient been seen for an appointment in the last year OR does the patient have an upcoming appointment? yes  Agent: Please be advised that RX refills may take up to 3 business days. We ask that you follow-up with your pharmacy.

## 2022-09-05 NOTE — Telephone Encounter (Signed)
Called and lvm to confirm if she has any doses at home due to non-compliance.

## 2022-09-08 NOTE — Progress Notes (Signed)
  Care Coordination   Note   09/08/2022 Name: Maureen [name] MRN: [id number] DOB: 24-May-1946  Maureen [name] is a 76 y.o. year old female who sees Alba Cory, MD for primary care. I reached out to [employer] by phone today to offer care coordination services.  Referral received   [name] was given information about Care Coordination services today including:   The Care Coordination services include support from the care team which includes your Nurse Coordinator, Clinical Social Worker, or Pharmacist.  The Care Coordination team is here to help remove barriers to the health concerns and goals most important to you. Care Coordination services are voluntary, and the patient may decline or stop services at any time by request to their care team member.   Care Coordination Consent Status: Patient agreed to services and verbal consent obtained.   Follow up plan:  Telephone appointment with care coordination team member scheduled for:  09/12/2022  Encounter Outcome:  Pt. Scheduled from referral   Burman Nieves, [facility] Care Coordination Care Guide Direct Dial: [phone]

## 2022-09-12 ENCOUNTER — Ambulatory Visit: Payer: Self-pay | Admitting: *Deleted

## 2022-09-12 NOTE — Patient Instructions (Signed)
Visit Information  Thank you for taking time to visit with me today. Please don't hesitate to contact me if I can be of assistance to you.   Following are the goals we discussed today:   Goals Addressed             This Visit's Progress    Medicaid and resources for depression       Care Coordination Interventions: Phone call to patient to complete needs assessment Patient discussed need to apply for Medicaid and would like resources to address her depression. Patient discussed need to re-schedule appointment to 09/15/22 at 10am           Our next appointment is by telephone on 09/15/22 at 10am  Please call the care guide team at [phone] if you need to cancel or reschedule your appointment.   If you are experiencing a Mental Health or Behavioral Health Crisis or need someone to talk to, please call 911   Patient verbalizes understanding of instructions and care plan provided today and agrees to view in MyChart. Active MyChart status and patient understanding of how to access instructions and care plan via MyChart confirmed with patient.     Telephone follow up appointment with care management team member scheduled for:  09/15/22  Verna Czech, LCSW Clinical Social Worker  [facility] Care Management [phone]

## 2022-09-12 NOTE — Patient Outreach (Signed)
  Care Coordination   Initial Visit Note   09/12/2022 Name: Maureen [name] MRN: [id number] DOB: 04-05-1946  Maureen [name] is a 76 y.o. year old female who sees Alba Cory, MD for primary care. I spoke with  Maureen [name] by phone today.  What matters to the patients health and wellness today?  Medicaid and mental health resources    Goals Addressed             This Visit's Progress    Medicaid and resources for depression       Care Coordination Interventions: Phone call to patient to complete needs assessment Patient discussed need to apply for Medicaid and would like resources to address her depression. Patient discussed need to re-schedule appointment to 09/15/22 at 10am           SDOH assessments and interventions completed:  No     Care Coordination Interventions Activated:  Yes  Care Coordination Interventions:  Yes, provided   Follow up plan: Follow up call scheduled for 09/15/22    Encounter Outcome:  Pt. Visit Completed

## 2022-09-15 ENCOUNTER — Other Ambulatory Visit: Payer: Self-pay | Admitting: *Deleted

## 2022-09-15 ENCOUNTER — Ambulatory Visit: Payer: Self-pay | Admitting: *Deleted

## 2022-09-15 DIAGNOSIS — D709 Neutropenia, unspecified: Secondary | ICD-10-CM

## 2022-09-15 NOTE — Patient Outreach (Signed)
  Care Coordination   Initial Visit Note   09/15/2022 Name: Maureen [name] MRN: [id number] DOB: 1946-03-16  Maureen [name] is a 76 y.o. year old female who sees Alba Cory, MD for primary care. I spoke with  [name] by phone today.  What matters to the patients health and wellness today?  Phone call to patient for scheduled appointment, however patient stated that today was not a good day to talk and requested a re-schedule. Appointment re-scheduled for 09/23/22 at 1pm.    Goals Addressed   None     SDOH assessments and interventions completed:  No     Care Coordination Interventions Activated:  No  Care Coordination Interventions:  No, not indicated   Follow up plan: Follow up call scheduled for 09/23/22    Encounter Outcome:  Pt. Visit Completed

## 2022-09-16 ENCOUNTER — Inpatient Hospital Stay: Payer: Medicare HMO | Admitting: Oncology

## 2022-09-16 ENCOUNTER — Inpatient Hospital Stay: Payer: Medicare HMO | Attending: Oncology

## 2022-09-17 ENCOUNTER — Encounter: Payer: Self-pay | Admitting: Oncology

## 2022-10-03 ENCOUNTER — Telehealth: Payer: Self-pay | Admitting: *Deleted

## 2022-10-03 NOTE — Patient Outreach (Signed)
  Care Coordination   Follow Up Visit Note   10/03/2022 Name: Maureen [name] MRN: [id number] DOB: [birth year]  Maureen [name] is a 76 y.o. year old female who sees Alba Cory, MD for primary care. I spoke with  Gilford Rile Levi's son and niece by phone today.  What matters to the patients health and wellness today?  Applying for Medicaid    Goals Addressed             This Visit's Progress    Medicaid and resources for depression       Care Coordination Interventions: Phone call from patient's niece Riki Sheer and son Pricilla Holm with questions regarding medicaid Per patient's son, he completed the application for medicaid, however was not able to return the required documents before the requested deadline [name] confirmed having the required documents and will return them personally to the Department of Social Services for processing on Monday [name] encouraged to follow up with the Department of Social Services regarding the status of patient;s Medicaid application          SDOH assessments and interventions completed:  No     Care Coordination Interventions:  Yes, provided   Follow up plan: No further intervention required.   Encounter Outcome:  Pt. Visit Completed

## 2022-10-03 NOTE — Patient Instructions (Signed)
Visit Information  Thank you for taking time to visit with me today. Please don't hesitate to contact me if I can be of assistance to you.   Following are the goals we discussed today:   Goals Addressed             This Visit's Progress    Medicaid and resources for depression       Care Coordination Interventions: Phone call from patient's niece Riki Sheer and son Pricilla Holm with questions regarding medicaid Per patient's son, he completed the application for medicaid, however was not able to return the required documents before the requested deadline [name] confirmed having the required documents and will return them personally to the Department of Social Services for processing on Monday [name] encouraged to follow up with the Department of Social Services regarding the status of patient;s Medicaid application           Please call the care guide team at [phone] if you need to cancel or reschedule your appointment.   If you are experiencing a Mental Health or Behavioral Health Crisis or need someone to talk to, please call 911   Patient verbalizes understanding of instructions and care plan provided today and agrees to view in MyChart. Active MyChart status and patient understanding of how to access instructions and care plan via MyChart confirmed with patient.     No further follow up required: patient's son to continue to follow up with the Department of Social Services regarding the status of patient's Medicaid application.   Verna Czech, LCSW Clinical Social Worker  [facility] Care Management [phone]

## 2022-10-16 ENCOUNTER — Telehealth: Payer: Self-pay | Admitting: Family Medicine

## 2022-10-16 NOTE — Telephone Encounter (Signed)
Called Maureen [name] to clarify what order they are referring to. We haven't done any recent orders for her. No answer and mailbox full.

## 2022-10-16 NOTE — Telephone Encounter (Signed)
Copied from CRM [id number]. Topic: General - Other >> Oct 16, 2022 11:55 AM Macon Large wrote: Reason for CRM: Teri with [facility] called to request the patient's address and to see what the order that was requested is for. Cb# [phone]  fax# [phone]

## 2022-10-17 ENCOUNTER — Other Ambulatory Visit: Payer: Self-pay

## 2022-10-17 ENCOUNTER — Telehealth: Payer: Self-pay | Admitting: Family Medicine

## 2022-10-17 DIAGNOSIS — R1319 Other dysphagia: Secondary | ICD-10-CM

## 2022-10-17 DIAGNOSIS — E43 Unspecified severe protein-calorie malnutrition: Secondary | ICD-10-CM

## 2022-10-17 DIAGNOSIS — E039 Hypothyroidism, unspecified: Secondary | ICD-10-CM

## 2022-10-17 NOTE — Telephone Encounter (Signed)
New referral to home health placed for [facility].

## 2022-10-17 NOTE — Telephone Encounter (Signed)
Copied from CRM [id number]. Topic: General - Other >> Oct 17, 2022  9:33 AM Everette C wrote: Reason for CRM: [place] with [facility] has called to request a new home health order for nursing and social work   Please contact [place] further if/when possible regarding the order   Please fax order to [phone] Attn: [place]

## 2022-12-05 ENCOUNTER — Telehealth: Payer: Self-pay | Admitting: Family Medicine

## 2022-12-05 NOTE — Telephone Encounter (Signed)
Lvm on both #'s for pt to call back and schedule an appt

## 2022-12-05 NOTE — Telephone Encounter (Signed)
[place] from [facility] states that she received a home health order for the pt. Pt has not been seen since October and will need to be seen in order to get a home health visit. Per [place] pt has to be seen within 30days to get a home health visit.  Please advise [place] phone number: [phone]

## 2023-01-28 ENCOUNTER — Telehealth: Payer: Self-pay | Admitting: Family Medicine

## 2023-01-28 NOTE — Telephone Encounter (Signed)
Contacted Winifred Olive Lawley to schedule their annual wellness visit. Appointment made for 02/13/2023.  [place] Direct Dial: [phone]

## 2023-03-19 ENCOUNTER — Ambulatory Visit: Payer: Medicare HMO

## 2023-04-03 ENCOUNTER — Ambulatory Visit: Payer: Medicare HMO

## 2023-04-24 ENCOUNTER — Ambulatory Visit (INDEPENDENT_AMBULATORY_CARE_PROVIDER_SITE_OTHER): Payer: Medicare HMO

## 2023-04-24 VITALS — Ht 63.0 in | Wt <= 1120 oz

## 2023-04-24 DIAGNOSIS — Z0000 Encounter for general adult medical examination without abnormal findings: Secondary | ICD-10-CM | POA: Diagnosis not present

## 2023-04-24 NOTE — Patient Instructions (Signed)
[name] , Thank you for taking time to come for your Medicare Wellness Visit. I appreciate your ongoing commitment to your health goals. Please review the following plan we discussed and let me know if I can assist you in the future.   These are the goals we discussed:  Goals      DIET - INCREASE WATER INTAKE     Recommend to drink at least 6-8 8oz glasses of water per day.     Medicaid and resources for depression     Care Coordination Interventions: Phone call from patient's niece Riki Sheer and son Pricilla Holm with questions regarding medicaid Per patient's son, he completed the application for medicaid, however was not able to return the required documents before the requested deadline [name] confirmed having the required documents and will return them personally to the Department of Social Services for processing on Monday [name] encouraged to follow up with the Department of Social Services regarding the status of patient;s Medicaid application          This is a list of the screening recommended for you and due dates:  Health Maintenance  Topic Date Due   DTaP/Tdap/Td vaccine (1 - Tdap) Never done   Zoster (Shingles) Vaccine (1 of 2) Never done   Pneumonia Vaccine (1 of 1 - PCV) Never done   Mammogram  02/14/2021   DEXA scan (bone density measurement)  08/18/2029*   Flu Shot  05/28/2023   Hepatitis C Screening  Completed   HPV Vaccine  Aged Out   Colon Cancer Screening  Discontinued   COVID-19 Vaccine  Discontinued  *Topic was postponed. The date shown is not the original due date.    Advanced directives: yes  Conditions/risks identified: low falls risk  Next appointment: Follow up in one year for your annual wellness visit 04/28/2024 @ 2:30pm telephone   Preventive Care 65 Years and Older, Female Preventive care refers to lifestyle choices and visits with your health care provider that can promote health and wellness. What does preventive  care include? A yearly physical exam. This is also called an annual well check. Dental exams once or twice a year. Routine eye exams. Ask your health care provider how often you should have your eyes checked. Personal lifestyle choices, including: Daily care of your teeth and gums. Regular physical activity. Eating a healthy diet. Avoiding tobacco and drug use. Limiting alcohol use. Practicing safe sex. Taking low-dose aspirin every day. Taking vitamin and mineral supplements as recommended by your health care provider. What happens during an annual well check? The services and screenings done by your health care provider during your annual well check will depend on your age, overall health, lifestyle risk factors, and family history of disease. Counseling  Your health care provider may ask you questions about your: Alcohol use. Tobacco use. Drug use. Emotional well-being. Home and relationship well-being. Sexual activity. Eating habits. History of falls. Memory and ability to understand (cognition). Work and work Astronomer. Reproductive health. Screening  You may have the following tests or measurements: Height, weight, and BMI. Blood pressure. Lipid and cholesterol levels. These may be checked every 5 years, or more frequently if you are over 35 years old. Skin check. Lung cancer screening. You may have this screening every year starting at age 82 if you have a 30-pack-year history of smoking and currently smoke or have quit within the past 15 years. Fecal occult blood test (FOBT) of the stool. You may have this  test every year starting at age 76. Flexible sigmoidoscopy or colonoscopy. You may have a sigmoidoscopy every 5 years or a colonoscopy every 10 years starting at age 60. Hepatitis C blood test. Hepatitis B blood test. Sexually transmitted disease (STD) testing. Diabetes screening. This is done by checking your blood sugar (glucose) after you have not eaten for a  while (fasting). You may have this done every 1-3 years. Bone density scan. This is done to screen for osteoporosis. You may have this done starting at age 17. Mammogram. This may be done every 1-2 years. Talk to your health care provider about how often you should have regular mammograms. Talk with your health care provider about your test results, treatment options, and if necessary, the need for more tests. Vaccines  Your health care provider may recommend certain vaccines, such as: Influenza vaccine. This is recommended every year. Tetanus, diphtheria, and acellular pertussis (Tdap, Td) vaccine. You may need a Td booster every 10 years. Zoster vaccine. You may need this after age 84. Pneumococcal 13-valent conjugate (PCV13) vaccine. One dose is recommended after age 48. Pneumococcal polysaccharide (PPSV23) vaccine. One dose is recommended after age 31. Talk to your health care provider about which screenings and vaccines you need and how often you need them. This information is not intended to replace advice given to you by your health care provider. Make sure you discuss any questions you have with your health care provider. Document Released: 11/09/2015 Document Revised: 07/02/2016 Document Reviewed: 08/14/2015 Elsevier Interactive Patient Education  2017 [employer].  Fall Prevention in the Home Falls can cause injuries. They can happen to people of all ages. There are many things you can do to make your home safe and to help prevent falls. What can I do on the outside of my home? Regularly fix the edges of walkways and driveways and fix any cracks. Remove anything that might make you trip as you walk through a door, such as a raised step or threshold. Trim any bushes or trees on the path to your home. Use bright outdoor lighting. Clear any walking paths of anything that might make someone trip, such as rocks or tools. Regularly check to see if handrails are loose or broken. Make  sure that both sides of any steps have handrails. Any raised decks and porches should have guardrails on the edges. Have any leaves, snow, or ice cleared regularly. Use sand or salt on walking paths during winter. Clean up any spills in your garage right away. This includes oil or grease spills. What can I do in the bathroom? Use night lights. Install grab bars by the toilet and in the tub and shower. Do not use towel bars as grab bars. Use non-skid mats or decals in the tub or shower. If you need to sit down in the shower, use a plastic, non-slip stool. Keep the floor dry. Clean up any water that spills on the floor as soon as it happens. Remove soap buildup in the tub or shower regularly. Attach bath mats securely with double-sided non-slip rug tape. Do not have throw rugs and other things on the floor that can make you trip. What can I do in the bedroom? Use night lights. Make sure that you have a light by your bed that is easy to reach. Do not use any sheets or blankets that are too big for your bed. They should not hang down onto the floor. Have a firm chair that has side arms. You can  use this for support while you get dressed. Do not have throw rugs and other things on the floor that can make you trip. What can I do in the kitchen? Clean up any spills right away. Avoid walking on wet floors. Keep items that you use a lot in easy-to-reach places. If you need to reach something above you, use a strong step stool that has a grab bar. Keep electrical cords out of the way. Do not use floor polish or wax that makes floors slippery. If you must use wax, use non-skid floor wax. Do not have throw rugs and other things on the floor that can make you trip. What can I do with my stairs? Do not leave any items on the stairs. Make sure that there are handrails on both sides of the stairs and use them. Fix handrails that are broken or loose. Make sure that handrails are as long as the  stairways. Check any carpeting to make sure that it is firmly attached to the stairs. Fix any carpet that is loose or worn. Avoid having throw rugs at the top or bottom of the stairs. If you do have throw rugs, attach them to the floor with carpet tape. Make sure that you have a light switch at the top of the stairs and the bottom of the stairs. If you do not have them, ask someone to add them for you. What else can I do to help prevent falls? Wear shoes that: Do not have high heels. Have rubber bottoms. Are comfortable and fit you well. Are closed at the toe. Do not wear sandals. If you use a stepladder: Make sure that it is fully opened. Do not climb a closed stepladder. Make sure that both sides of the stepladder are locked into place. Ask someone to hold it for you, if possible. Clearly mark and make sure that you can see: Any grab bars or handrails. First and last steps. Where the edge of each step is. Use tools that help you move around (mobility aids) if they are needed. These include: Canes. Walkers. Scooters. Crutches. Turn on the lights when you go into a dark area. Replace any light bulbs as soon as they burn out. Set up your furniture so you have a clear path. Avoid moving your furniture around. If any of your floors are uneven, fix them. If there are any pets around you, be aware of where they are. Review your medicines with your doctor. Some medicines can make you feel dizzy. This can increase your chance of falling. Ask your doctor what other things that you can do to help prevent falls. This information is not intended to replace advice given to you by your health care provider. Make sure you discuss any questions you have with your health care provider. Document Released: 08/09/2009 Document Revised: 03/20/2016 Document Reviewed: 11/17/2014 Elsevier Interactive Patient Education  2017 [employer].

## 2023-04-24 NOTE — Progress Notes (Signed)
Subjective:   Maureen [name] is a 77 y.o. female who presents for Medicare Annual (Subsequent) preventive examination.  Visit Complete: Virtual  I connected with  Maureen [name] on 04/24/23 by a audio enabled telemedicine application and verified that I am speaking with the correct person using two identifiers.  Patient Location: Home  Provider Location: Office/Clinic  I discussed the limitations of evaluation and management by telemedicine. The patient expressed understanding and agreed to proceed.  Patient Medicare AWV questionnaire was completed by the patient on (not done); I have confirmed that all information answered by patient is correct and no changes since this date.  Review of Systems    Cardiac Risk Factors include: advanced age (>31men, >49 women);dyslipidemia;hypertension;sedentary lifestyle    Objective:    Today's Vitals   04/24/23 1301  Weight: 70 lb (31.8 kg)  Height: 5\' 3"  (1.6 m)   Body mass index is 12.4 kg/m.     04/24/2023    1:09 PM 03/13/2022    2:00 PM 09/17/2021    3:02 PM 01/25/2021    1:37 PM 09/12/2020    3:38 AM 08/09/2020   11:36 AM 07/17/2020    1:57 PM  Advanced Directives  Does Patient Have a Medical Advance Directive? Yes No No No No No Yes  Type of Estate agent of [place];Living will      Healthcare Power of Attorney  Does patient want to make changes to medical advance directive?   Yes (MAU/Ambulatory/Procedural Areas - Information given)    No - Patient declined  Copy of Healthcare Power of Attorney in Chart?       No - copy requested  Would patient like information on creating a medical advance directive?    No - Patient declined No - Patient declined Yes (MAU/Ambulatory/Procedural Areas - Information given)     Current Medications (verified) Outpatient Encounter Medications as of 04/24/2023  Medication Sig   Ensure (ENSURE) Take 237 mLs by mouth. Pt drinking approx 3 ensures per day   levothyroxine  (SYNTHROID) 100 MCG tablet Take 1 tablet by mouth once daily   omeprazole (PRILOSEC) 40 MG capsule Take 1 capsule (40 mg total) by mouth daily before breakfast.   No facility-administered encounter medications on file as of 04/24/2023.    Allergies (verified) Aspirin, Citalopram, Codeine, and Penicillins   History: Past Medical History:  Diagnosis Date   Allergy    Corn of toe    Depression    First degree AV block    GERD (gastroesophageal reflux disease)    Hammer toe of right foot    Hyperglycemia    Hyperlipidemia    Hypertension    Loss of appetite    Prolapse of urethra    Scoliosis (and kyphoscoliosis), idiopathic    Thyroid disease    Tinnitus of both ears    Vitreous floaters of right eye    Past Surgical History:  Procedure Laterality Date   ABDOMINAL HYSTERECTOMY  1989   APPENDECTOMY  1989   BREAST EXCISIONAL BIOPSY Bilateral    multiple biopsies negative   BREAST SURGERY     Several   COLONOSCOPY WITH PROPOFOL N/A 07/28/2019   Procedure: COLONOSCOPY WITH PROPOFOL;  Surgeon: Toney Reil, MD;  Location: [facility] ENDOSCOPY;  Service: Gastroenterology;  Laterality: N/A;   ESOPHAGOGASTRODUODENOSCOPY (EGD) WITH PROPOFOL N/A 11/11/2019   Procedure: ESOPHAGOGASTRODUODENOSCOPY (EGD) WITH PROPOFOL;  Surgeon: Toney Reil, MD;  Location: [facility] ENDOSCOPY;  Service: Gastroenterology;  Laterality: N/A;  TUMOR EXCISION  1989   same time as hysterectomy   Family History  Problem Relation Age of Onset   Diabetes Father 82       DM complications   Hypertension Mother 91       CKD   Thyroid disease Mother    Heart failure Mother    Kidney disease Mother    Depression Mother    Breast cancer Sister 88   Depression Sister    Kidney disease Other    Breast cancer Maternal Aunt    Breast cancer Cousin    Anuerysm Son 106   Diabetes Son    Social History   Socioeconomic History   Marital status: Widowed    Spouse name: Not on file   Number of children: 3    Years of education: 20   Highest education level: 12th grade  Occupational History   Occupation: Retired  Tobacco Use   Smoking status: Never   Smokeless tobacco: Never   Tobacco comments:    smoking cessation materials not required  Vaping Use   Vaping Use: Never used  Substance and Sexual Activity   Alcohol use: Never    Alcohol/week: 0.0 standard drinks of alcohol   Drug use: No   Sexual activity: Not Currently    Partners: Male  Other Topics Concern   Not on file  Social History Narrative   Pt lives alone; son Maureen [name] nearby and helps almost daily   Social Determinants of Health   Financial Resource Strain: Low Risk  (04/24/2023)   Overall Financial Resource Strain (CARDIA)    Difficulty of Paying Living Expenses: Not hard at all  Food Insecurity: No Food Insecurity (04/24/2023)   Hunger Vital Sign    Worried About Running Out of Food in the Last Year: Never true    Ran Out of Food in the Last Year: Never true  Transportation Needs: No Transportation Needs (04/24/2023)   PRAPARE - Administrator, Civil Service (Medical): No    Lack of Transportation (Non-Medical): No  Physical Activity: Inactive (04/24/2023)   Exercise Vital Sign    Days of Exercise per Week: 0 days    Minutes of Exercise per Session: 0 min  Stress: No Stress Concern Present (04/24/2023)   [employer] of Occupational Health - Occupational Stress Questionnaire    Feeling of Stress : Not at all  Social Connections: Moderately Integrated (04/24/2023)   Social Connection and Isolation Panel [NHANES]    Frequency of Communication with Friends and Family: More than three times a week    Frequency of Social Gatherings with Friends and Family: More than three times a week    Attends Religious Services: More than 4 times per year    Active Member of [employer] or Organizations: Yes    Attends Banker Meetings: More than 4 times per year    Marital Status: Widowed    Tobacco  Counseling Counseling given: Not Answered Tobacco comments: smoking cessation materials not required   Clinical Intake:  Pre-visit preparation completed: Yes  Pain : No/denies pain     BMI - recorded: 12.4 Nutritional Status: BMI <19  Underweight Nutritional Risks: None Diabetes: No  How often do you need to have someone help you when you read instructions, pamphlets, or other written materials from your doctor or pharmacy?: 1 - Never  Interpreter Needed?: No  Comments: lives alone Information entered by :: B.Clairessa Boulet,LPN   Activities of Daily Living    04/24/2023  1:09 PM 08/25/2022    3:52 PM  In your present state of health, do you have any difficulty performing the following activities:  Hearing? 0 0  Vision? 0 0  Difficulty concentrating or making decisions? 0 1  Walking or climbing stairs? 0 0  Dressing or bathing? 0 0  Doing errands, shopping? 0 1  Preparing Food and eating ? N   Using the Toilet? N   In the past six months, have you accidently leaked urine? N   Do you have problems with loss of bowel control? N   Managing your Medications? N   Managing your Finances? N   Housekeeping or managing your Housekeeping? N     Patient Care Team: Alba Cory, MD as PCP - General (Family Medicine) Isla Pence, OD as Consulting Physician (Optometry) Wyn Quaker, Marlow Baars, MD as Consulting Physician (Vascular Surgery) Jeralyn Ruths, MD as Consulting Physician (Oncology) Nadara Mustard, MD as Referring Physician (Obstetrics and Gynecology) Neysa Hotter, MD as Consulting Physician (Psychiatry) Wenda Overland, LCSW as Social Worker  Indicate any recent Medical Services you may have received from other than [facility] providers in the past year (date may be approximate).     Assessment:   This is a routine wellness examination for Christene.  Hearing/Vision screen Hearing Screening - Comments:: Adequate hearing Vision Screening - Comments:: Adequate  vision;readers only Dr Clydene Pugh  Dietary issues and exercise activities discussed:     Goals Addressed             This Visit's Progress    DIET - INCREASE WATER INTAKE   On track    Recommend to drink at least 6-8 8oz glasses of water per day.     Medicaid and resources for depression   On track    Care Coordination Interventions: Phone call from patient's niece Riki Sheer and son Pricilla Holm with questions regarding medicaid Per patient's son, he completed the application for medicaid, however was not able to return the required documents before the requested deadline [name] confirmed having the required documents and will return them personally to the Department of Social Services for processing on Monday [name] encouraged to follow up with the Department of Social Services regarding the status of patient;s Medicaid application         Depression Screen    04/24/2023    1:07 PM 08/25/2022    5:07 PM 02/25/2022    1:58 PM 11/08/2021    2:22 PM 09/17/2021    3:00 PM 07/08/2021    2:25 PM 03/20/2021   11:07 AM  PHQ 2/9 Scores  PHQ - 2 Score 1  4 0 0 2 2  PHQ- 9 Score   12 0  5 7  Exception Documentation  Other- indicate reason in comment box       Not completed  son filled it out         Fall Risk    04/24/2023    1:04 PM 08/25/2022    3:51 PM 02/25/2022    1:58 PM 11/08/2021    2:22 PM 09/17/2021    3:04 PM  Fall Risk   Falls in the past year? 0 0 0 0 0  Number falls in past yr: 0   0 0  Injury with Fall? 0   0 0  Risk for fall due to : No Fall Risks No Fall Risks No Fall Risks No Fall Risks No Fall Risks  Follow up Education provided;Falls  prevention discussed Falls prevention discussed;Education provided;Falls evaluation completed Falls prevention discussed Falls prevention discussed Falls prevention discussed    MEDICARE RISK AT HOME:  Medicare Risk at Home - 04/24/23 1304     Any stairs in or around the home? No    If so, are there any  without handrails? No    Home free of loose throw rugs in walkways, pet beds, electrical cords, etc? Yes    Adequate lighting in your home to reduce risk of falls? Yes    Life alert? No    Use of a cane, walker or w/c? Yes   cane as needed   Grab bars in the bathroom? Yes    Shower chair or bench in shower? No    Elevated toilet seat or a handicapped toilet? No             TIMED UP AND GO:  Was the test performed?  No    Cognitive Function:        04/24/2023    1:10 PM 08/09/2020   11:39 AM 08/05/2019   11:42 AM 05/14/2018   10:08 AM  6CIT Screen  What Year? 0 points 0 points 0 points 0 points  What month? 0 points 0 points 0 points 0 points  What time? 0 points 0 points 0 points 0 points  Count back from 20 0 points 0 points 0 points 0 points  Months in reverse 0 points 4 points 0 points 4 points  Repeat phrase 4 points 4 points 4 points 10 points  Total Score 4 points 8 points 4 points 14 points    Immunizations There is no immunization history for the selected administration types on file for this patient.  TDAP status: Due, Education has been provided regarding the importance of this vaccine. Advised may receive this vaccine at local pharmacy or Health Dept. Aware to provide a copy of the vaccination record if obtained from local pharmacy or Health Dept. Verbalized acceptance and understanding.  Flu Vaccine status: Declined, Education has been provided regarding the importance of this vaccine but patient still declined. Advised may receive this vaccine at local pharmacy or Health Dept. Aware to provide a copy of the vaccination record if obtained from local pharmacy or Health Dept. Verbalized acceptance and understanding.  Pneumococcal vaccine status: Declined,  Education has been provided regarding the importance of this vaccine but patient still declined. Advised may receive this vaccine at local pharmacy or Health Dept. Aware to provide a copy of the vaccination  record if obtained from local pharmacy or Health Dept. Verbalized acceptance and understanding.   Covid-19 vaccine status: Declined, Education has been provided regarding the importance of this vaccine but patient still declined. Advised may receive this vaccine at local pharmacy or Health Dept.or vaccine clinic. Aware to provide a copy of the vaccination record if obtained from local pharmacy or Health Dept. Verbalized acceptance and understanding.  Qualifies for Shingles Vaccine? Yes   Zostavax completed No   Shingrix Completed?: No.    Education has been provided regarding the importance of this vaccine. Patient has been advised to call insurance company to determine out of pocket expense if they have not yet received this vaccine. Advised may also receive vaccine at local pharmacy or Health Dept. Verbalized acceptance and understanding.  Screening Tests Health Maintenance  Topic Date Due   DTaP/Tdap/Td (1 - Tdap) Never done   Zoster Vaccines- Shingrix (1 of 2) Never done   Pneumonia Vaccine 44+ Years old (  1 of 1 - PCV) Never done   MAMMOGRAM  02/14/2021   DEXA SCAN  08/18/2029 (Originally 1946/01/07)   INFLUENZA VACCINE  05/28/2023   Hepatitis C Screening  Completed   HPV VACCINES  Aged Out   Colonoscopy  Discontinued   COVID-19 Vaccine  Discontinued    Health Maintenance  Health Maintenance Due  Topic Date Due   DTaP/Tdap/Td (1 - Tdap) Never done   Zoster Vaccines- Shingrix (1 of 2) Never done   Pneumonia Vaccine 63+ Years old (1 of 1 - PCV) Never done   MAMMOGRAM  02/14/2021    Colorectal cancer screening: Type of screening: Colonoscopy. Completed 5. Repeat every 5-10 years Pt aged out   Mammogram status: No longer required due to age.  Bone Density status: Completed yes. Results reflect: Bone density results: NORMAL. Repeat every 5 years.  Lung Cancer Screening: (Low Dose CT Chest recommended if Age 20-80 years, 20 pack-year currently smoking OR have quit w/in 15years.)  does not qualify.   Lung Cancer Screening Referral: no  Additional Screening:  Hepatitis C Screening: does not qualify; Completed yes  Vision Screening: Recommended annual ophthalmology exams for early detection of glaucoma and other disorders of the eye. Is the patient up to date with their annual eye exam?  Yes  Who is the provider or what is the name of the office in which the patient attends annual eye exams? Dr Clydene Pugh If pt is not established with a provider, would they like to be referred to a provider to establish care? No .   Dental Screening: Recommended annual dental exams for proper oral hygiene  Diabetic Foot Exam: n/a  Community Resource Referral / Chronic Care Management: CRR required this visit?  No   CCM required this visit?  No    Plan:     I have personally reviewed and noted the following in the patient's chart:   Medical and social history Use of alcohol, tobacco or illicit drugs  Current medications and supplements including opioid prescriptions. Patient is not currently taking opioid prescriptions. Functional ability and status Nutritional status Physical activity Advanced directives List of other physicians Hospitalizations, surgeries, and ER visits in previous 12 months Vitals Screenings to include cognitive, depression, and falls Referrals and appointments  In addition, I have reviewed and discussed with patient certain preventive protocols, quality metrics, and best practice recommendations. A written personalized care plan for preventive services as well as general preventive health recommendations were provided to patient.    Sue Lush, LPN   02/02/8118   After Visit Summary: (Declined) Due to this being a telephonic visit, with patients personalized plan was offered to patient but patient Declined AVS at this time   Nurse Notes: The patient states she is doing well and has no concerns or questions at this time.

## 2023-06-15 NOTE — Progress Notes (Deleted)
Name: Maureen [name]   MRN: [id number]    DOB: 10/22/1946   Date:06/15/2023       Progress Note  Subjective  Chief Complaint  Follow Up/ Labwork  HPI  Malnutrition: She has been evaluation by hematologist , gastroenterologist, psychiatrist and dietician . She continue to refuse to eat ,son states she no longer wants company for when she is eating. We referred her to palliative care and she had some home health that helped her eat but service ended months ago and son is asking if could be re-started. Her son continues to get frustrated with her, explained at this point he needs to offer her a variety of foods, snacks and let her chose what and when to eat, she has a component of eating disorder and also dysphagia. Asked him to talk to her about other things besides food   MDD: she was seen by Dr. Salvatore Decent - psychiatrist , last visit 05/2020 and has been released per patient/son's request, she does not want to take Remeron - afraid of polypharmacy. She was on clonazepam but has been off for months and has not noticed any difference in symptoms. She also stopped megace and Trazodone.    Hypothyroidism: she has been taking medication daily now, 88 mcg daily , she was supposed to be up to 112 mcg daily , we switched from 88 mcg to  100 mcg  last visit and she states she has been complaint but last TSH was still not at goal, not sure if compliant, we will recheck labs today again   . She continues to lose weight, has some dysphagia that is not secondary to thyroid, brittle nails and hair loss but likely from malnutrition we will recheck TSH today and adjust dose if still off   Paroxysmal Afib: she never started Eliquis, she states cardiologist called her for a visit but she did not schedule it. She denies chest pain or palpitation, she is afraid of taking medications.Son is aware of risk of strokes .Unchanged    Atherosclerosis of aorta: found on CT chest , patient is not interested on statin therapy,.    Pancytopenia: under the care of Dr. Orlie Dakin but skips appointments at times, reviewed her last labs   Vitamin D deficiency: she continues to refuse taking it Even the liquid form   Patient Active Problem List   Diagnosis Date Noted   Hyponatremia 09/24/2020   Protein-calorie malnutrition, severe 09/13/2020   Esophageal motility disorder    Palliative care by specialist    Advanced care planning/counseling discussion    Failure to thrive in adult 09/11/2020   Avoidant-restrictive food intake disorder (ARFID) 08/30/2020   Paroxysmal atrial fibrillation (HCC) 03/22/2020   Vitamin D deficiency 03/01/2020   Dysphagia    Arrhythmia 08/25/2019   Rectal bleeding    Low grade squamous intraepith lesion on cytologic smear cervix (lgsil) 08/04/2018   Chronic venous insufficiency 07/05/2018   Lymphedema 07/05/2018   Hypertension 04/02/2018   Swelling of limb 04/02/2018   Depression, major, recurrent, mild (HCC) 12/29/2017   Leukopenia 05/24/2017   Allergic rhinitis, seasonal 06/29/2015   Clavus 06/29/2015   1st degree AV block 06/29/2015   Hammer toe 06/29/2015   H/O: HTN (hypertension) 06/29/2015   Blood glucose elevated 06/29/2015   Floater, vitreous 06/29/2015   Bilateral tinnitus 06/29/2015   Hypothyroidism (acquired) 04/02/2015   Gastroesophageal reflux disease 04/02/2015   Hammer toe of right foot 04/02/2015   Scoliosis of thoracic spine 04/02/2015   Urethral prolapse  04/02/2015   Corn of toe 04/02/2015   Hyperlipidemia 04/02/2015   Varicose veins of both lower extremities 04/02/2015    Past Surgical History:  Procedure Laterality Date   ABDOMINAL HYSTERECTOMY  1989   APPENDECTOMY  1989   BREAST EXCISIONAL BIOPSY Bilateral    multiple biopsies negative   BREAST SURGERY     Several   COLONOSCOPY WITH PROPOFOL N/A 07/28/2019   Procedure: COLONOSCOPY WITH PROPOFOL;  Surgeon: Toney Reil, MD;  Location: [facility] ENDOSCOPY;  Service: Gastroenterology;  Laterality:  N/A;   ESOPHAGOGASTRODUODENOSCOPY (EGD) WITH PROPOFOL N/A 11/11/2019   Procedure: ESOPHAGOGASTRODUODENOSCOPY (EGD) WITH PROPOFOL;  Surgeon: Toney Reil, MD;  Location: [facility] ENDOSCOPY;  Service: Gastroenterology;  Laterality: N/A;   TUMOR EXCISION  1989   same time as hysterectomy    Family History  Problem Relation Age of Onset   Diabetes Father 73       DM complications   Hypertension Mother 41       CKD   Thyroid disease Mother    Heart failure Mother    Kidney disease Mother    Depression Mother    Breast cancer Sister 60   Depression Sister    Kidney disease Other    Breast cancer Maternal Aunt    Breast cancer Cousin    Anuerysm Son 58   Diabetes Son     Social History   Tobacco Use   Smoking status: Never   Smokeless tobacco: Never   Tobacco comments:    smoking cessation materials not required  Substance Use Topics   Alcohol use: Never    Alcohol/week: 0.0 standard drinks of alcohol     Current Outpatient Medications:    Ensure (ENSURE), Take 237 mLs by mouth. Pt drinking approx 3 ensures per day, Disp: , Rfl:    levothyroxine (SYNTHROID) 100 MCG tablet, Take 1 tablet by mouth once daily, Disp: 30 tablet, Rfl: 0   omeprazole (PRILOSEC) 40 MG capsule, Take 1 capsule (40 mg total) by mouth daily before breakfast., Disp: 90 capsule, Rfl: 3  Allergies  Allergen Reactions   Aspirin     Tinnitus   Citalopram Other (See Comments)   Codeine Nausea And Vomiting   Penicillins Rash    Has patient had a PCN reaction causing immediate rash, facial/tongue/throat swelling, SOB or lightheadedness with hypotension: Yes Has patient had a PCN reaction causing severe rash involving mucus membranes or skin necrosis: No Has patient had a PCN reaction that required hospitalization No Has patient had a PCN reaction occurring within the last 10 years: No If all of the above answers are "NO", then may proceed with Cephalosporin use.    I personally reviewed active  problem list, medication list, allergies, family history, social history, health maintenance with the patient/caregiver today.   ROS  ***  Objective  There were no vitals filed for this visit.  There is no height or weight on file to calculate BMI.  Physical Exam ***  No results found for this or any previous visit (from the past 2160 hour(s)).   PHQ2/9:    04/24/2023    1:07 PM 02/25/2022    1:58 PM 11/08/2021    2:22 PM 09/17/2021    3:00 PM 07/08/2021    2:25 PM  Depression screen PHQ 2/9  Decreased Interest 1 2 0 0 1  Down, Depressed, Hopeless 0 2 0 0 1  PHQ - 2 Score 1 4 0 0 2  Altered sleeping  0 0  0  Tired, decreased energy  2 0  1  Change in appetite  2 0  1  Feeling bad or failure about yourself   2 0  1  Trouble concentrating  2 0  0  Moving slowly or fidgety/restless  0 0  0  Suicidal thoughts  0 0  0  PHQ-9 Score  12 0  5  Difficult doing work/chores Not difficult at all Somewhat difficult       phq 9 is {gen pos [id number]}   Fall Risk:    04/24/2023    1:04 PM 08/25/2022    3:51 PM 02/25/2022    1:58 PM 11/08/2021    2:22 PM 09/17/2021    3:04 PM  Fall Risk   Falls in the past year? 0 0 0 0 0  Number falls in past yr: 0   0 0  Injury with Fall? 0   0 0  Risk for fall due to : No Fall Risks No Fall Risks No Fall Risks No Fall Risks No Fall Risks  Follow up Education provided;Falls prevention discussed Falls prevention discussed;Education provided;Falls evaluation completed Falls prevention discussed Falls prevention discussed Falls prevention discussed      Functional Status Survey:      Assessment & Plan  *** There are no diagnoses linked to this encounter.

## 2023-06-16 ENCOUNTER — Ambulatory Visit: Payer: Medicare HMO | Admitting: Family Medicine

## 2023-06-16 DIAGNOSIS — Z1231 Encounter for screening mammogram for malignant neoplasm of breast: Secondary | ICD-10-CM

## 2023-06-24 ENCOUNTER — Ambulatory Visit: Payer: Self-pay | Admitting: *Deleted

## 2023-06-24 NOTE — Telephone Encounter (Signed)
  Chief Complaint: "Bump" on head. Symptoms: Bumped head on cabinet 1-1/2 weeks ago. Bruising forehead,small laceration, no bleeding. Frequency: 1 -1/2 weeks ago Pertinent Negatives: Patient denies  Disposition: [x] ED /[] Urgent Care (no appt availability in office) / [] Appointment(In office/virtual)/ []  [facility]/ [] Home Care/ [] Refused Recommended Disposition /[] [facility]/ []  Follow-up with PCP Additional Notes:  Triage incomplete. Son called initially, not with pt, driving to her home. Called back, spoke to pt, son interrupting frequently with differing statement pt was making regarding incident. Son states acting differently since "Bump" Then states been going on for a while. States "We all tell and want her to go to ED but she keeps putting it off. "Son wanted a virtual appt now, to show pictures. Practice closing. Called for consult. Advised ED.  Reason for Disposition  Can't remember what happened (amnesia)    Per son. Pt verbalizes circumstances as she remembers.  Answer Assessment - Initial Assessment Questions 1. MECHANISM: "How did the injury happen?" For falls, ask: "What height did you fall from?" and "What surface did you fall against?"      Ran into cabinet, left eye and forehead 2. ONSET: "When did the injury happen?" (Minutes or hours ago)      1-1/2 ago 3. NEUROLOGIC SYMPTOMS: "Was there any loss of consciousness?" "Are there any other neurological symptoms?"      *No Answer* 4. MENTAL STATUS: "Does the person know who they are, who you are, and where they are?"      *No Answer* 5. LOCATION: "What part of the head was hit?"      *No Answer* 6. SCALP APPEARANCE: "What does the scalp look like? Is it bleeding now?" If Yes, ask: "Is it difficult to stop?"      *No Answer* 7. SIZE: For cuts, bruises, or swelling, ask: "How large is it?" (e.g., inches or centimeters)      Cut, clear 8. PAIN: "Is there any pain?" If Yes, ask: "How bad is it?"  (e.g.,  Scale 1-10; or mild, moderate, severe)      9. TETANUS: For any breaks in the skin, ask: "When was the last tetanus booster?"     *No Answer* 10. OTHER SYMPTOMS: "Do you have any other symptoms?" (e.g., neck pain, vomiting)       Dry scab  Protocols used: Head Injury-A-AH

## 2023-06-25 ENCOUNTER — Other Ambulatory Visit: Payer: Self-pay

## 2023-06-25 ENCOUNTER — Emergency Department
Admission: EM | Admit: 2023-06-25 | Discharge: 2023-06-25 | Disposition: A | Discharge status: Home / Self Care | Payer: Medicare HMO | Attending: Emergency Medicine | Admitting: Emergency Medicine

## 2023-06-25 ENCOUNTER — Encounter: Payer: Self-pay | Admitting: Emergency Medicine

## 2023-06-25 DIAGNOSIS — B029 Zoster without complications: Secondary | ICD-10-CM | POA: Insufficient documentation

## 2023-06-25 DIAGNOSIS — I10 Essential (primary) hypertension: Secondary | ICD-10-CM | POA: Insufficient documentation

## 2023-06-25 DIAGNOSIS — R21 Rash and other nonspecific skin eruption: Secondary | ICD-10-CM | POA: Diagnosis present

## 2023-06-25 MED ORDER — BACITRACIN 500 UNIT/GM EX OINT: 1.0000 | TOPICAL_OINTMENT | Freq: Two times a day (BID) | CUTANEOUS | 0 refills | Status: AC

## 2023-06-25 NOTE — ED Notes (Signed)
See triage note  Presents with rash to forehead and rash  States started a couple of days ago  denies any pain

## 2023-06-25 NOTE — ED Triage Notes (Signed)
Pt comes with c/o rash to face and forehead. Pt states this started few days ago but has since gotten worse. Pt states itching and drainage. Pt denies any pain with it.

## 2023-06-25 NOTE — ED Provider Notes (Signed)
[facility] Provider Note    Event Date/Time   First MD Initiated Contact with Patient 06/25/23 0935     (approximate)   History   Rash   HPI  Maureen [name] is a 77 y.o. female PMH of HTN, HLD, depression, thyroid disease presents for evaluation of a rash on her face and forehead.  Patient reports the rash began a few days ago but has gotten worse.  Started as 1 spot in the middle of her forehead and then spread across her forehead and a little onto her upper cheek. She endorses itching and clear drainage from the rash but no pain.  She denies any vision changes and eye pain.     Physical Exam   Triage Vital Signs: ED Triage Vitals  Encounter Vitals Group     BP 06/25/23 0916 (!) 110/58     Systolic BP Percentile --      Diastolic BP Percentile --      Pulse Rate 06/25/23 0916 72     Resp 06/25/23 0916 18     Temp 06/25/23 0916 98.2 F (36.8 C)     Temp src --      SpO2 06/25/23 0916 100 %     Weight 06/25/23 0941 70 lb 1.7 oz (31.8 kg)     Height 06/25/23 0941 5\' 3"  (1.6 m)     Head Circumference --      Peak Flow --      Pain Score 06/25/23 0915 0     Pain Loc --      Pain Education --      Exclude from Growth Chart --     Most recent vital signs: Vitals:   06/25/23 0916  BP: (!) 110/58  Pulse: 72  Resp: 18  Temp: 98.2 F (36.8 C)  SpO2: 100%    General: Awake, no distress.  CV:  Good peripheral perfusion.  Resp:  Normal effort.  Abd:  No distention.  Other:  Multiple patches of crusted over lesions on the forehead and upper cheek, no lesions on tip of nose or ear, lesions do not cross the midline, no lesions in the ear   ED Results / Procedures / Treatments   Labs (all labs ordered are listed, but only abnormal results are displayed) Labs Reviewed - No data to display   PROCEDURES:  Critical Care performed: No  Procedures   MEDICATIONS ORDERED IN ED: Medications - No data to display   IMPRESSION / MDM /  ASSESSMENT AND PLAN / ED COURSE  I reviewed the triage vital signs and the nursing notes.                             Devenney 39-year-old female presents for evaluation of a facial rash.  VSS and patient NAD on exam.  Differential diagnosis includes, but is not limited to, shingles, impetigo, abrasion.  Patient's presentation is most consistent with acute, uncomplicated illness.  Patient's diagnosis is most consistent with late stage shingles. No evidence of herpes ophthalmicus or oticus. Since she has been symptomatic for several days, antivirals are not indicated.  I will treat patient with bacitracin ointment.  She can take Tylenol or ibuprofen as needed for pain.  Patient voiced understanding, all questions were answered and she was stable at discharge.    FINAL CLINICAL IMPRESSION(S) / ED DIAGNOSES   Final diagnoses:  Herpes zoster without complication  Rx / DC Orders   ED Discharge Orders          Ordered    bacitracin 500 UNIT/GM ointment  2 times daily        06/25/23 1025             Note:  This document was prepared using Dragon voice recognition software and may include unintentional dictation errors.   Cameron Ali, PA-C 06/25/23 1032    Dionne Bucy, MD 06/26/23 1139

## 2023-06-25 NOTE — Discharge Instructions (Addendum)
Your rash today is consistent with shingles.  Please apply bacitracin ointment or another triple antibiotic ointment to the lesions as this will help prevent infection and encourage healing.  You can return to the ED with any new or worsening symptoms.

## 2023-06-30 NOTE — Progress Notes (Deleted)
Name: Maureen [name]   MRN: [id number]    DOB: 12-03-1945   Date:06/30/2023       Progress Note  Subjective  Chief Complaint  Follow Up/ Labwork  HPI  Malnutrition: She has been evaluation by hematologist , gastroenterologist, psychiatrist and dietician . She continue to refuse to eat ,son states she no longer wants company for when she is eating. We referred her to palliative care and she had some home health that helped her eat but service ended months ago and son is asking if could be re-started. Her son continues to get frustrated with her, explained at this point he needs to offer her a variety of foods, snacks and let her chose what and when to eat, she has a component of eating disorder and also dysphagia. Asked him to talk to her about other things besides food   MDD: she was seen by Dr. Salvatore Decent - psychiatrist , last visit 05/2020 and has been released per patient/son's request, she does not want to take Remeron - afraid of polypharmacy. She was on clonazepam but has been off for months and has not noticed any difference in symptoms. She also stopped megace and Trazodone.    Hypothyroidism: she has been taking medication daily now, 88 mcg daily , she was supposed to be up to 112 mcg daily , we switched from 88 mcg to  100 mcg  last visit and she states she has been complaint but last TSH was still not at goal, not sure if compliant, we will recheck labs today again   . She continues to lose weight, has some dysphagia that is not secondary to thyroid, brittle nails and hair loss but likely from malnutrition we will recheck TSH today and adjust dose if still off   Paroxysmal Afib: she never started Eliquis, she states cardiologist called her for a visit but she did not schedule it. She denies chest pain or palpitation, she is afraid of taking medications.Son is aware of risk of strokes .Unchanged    Atherosclerosis of aorta: found on CT chest , patient is not interested on statin therapy,.    Pancytopenia: under the care of Dr. Orlie Dakin but skips appointments at times, reviewed her last labs   Vitamin D deficiency: she continues to refuse taking it Even the liquid form   Patient Active Problem List   Diagnosis Date Noted   Hyponatremia 09/24/2020   Protein-calorie malnutrition, severe 09/13/2020   Esophageal motility disorder    Palliative care by specialist    Advanced care planning/counseling discussion    Failure to thrive in adult 09/11/2020   Avoidant-restrictive food intake disorder (ARFID) 08/30/2020   Paroxysmal atrial fibrillation (HCC) 03/22/2020   Vitamin D deficiency 03/01/2020   Dysphagia    Arrhythmia 08/25/2019   Rectal bleeding    Low grade squamous intraepith lesion on cytologic smear cervix (lgsil) 08/04/2018   Chronic venous insufficiency 07/05/2018   Lymphedema 07/05/2018   Hypertension 04/02/2018   Swelling of limb 04/02/2018   Depression, major, recurrent, mild (HCC) 12/29/2017   Leukopenia 05/24/2017   Allergic rhinitis, seasonal 06/29/2015   Clavus 06/29/2015   1st degree AV block 06/29/2015   Hammer toe 06/29/2015   H/O: HTN (hypertension) 06/29/2015   Blood glucose elevated 06/29/2015   Floater, vitreous 06/29/2015   Bilateral tinnitus 06/29/2015   Hypothyroidism (acquired) 04/02/2015   Gastroesophageal reflux disease 04/02/2015   Hammer toe of right foot 04/02/2015   Scoliosis of thoracic spine 04/02/2015   Urethral prolapse  04/02/2015   Corn of toe 04/02/2015   Hyperlipidemia 04/02/2015   Varicose veins of both lower extremities 04/02/2015    Past Surgical History:  Procedure Laterality Date   ABDOMINAL HYSTERECTOMY  1989   APPENDECTOMY  1989   BREAST EXCISIONAL BIOPSY Bilateral    multiple biopsies negative   BREAST SURGERY     Several   COLONOSCOPY WITH PROPOFOL N/A 07/28/2019   Procedure: COLONOSCOPY WITH PROPOFOL;  Surgeon: Toney Reil, MD;  Location: [facility] ENDOSCOPY;  Service: Gastroenterology;  Laterality:  N/A;   ESOPHAGOGASTRODUODENOSCOPY (EGD) WITH PROPOFOL N/A 11/11/2019   Procedure: ESOPHAGOGASTRODUODENOSCOPY (EGD) WITH PROPOFOL;  Surgeon: Toney Reil, MD;  Location: [facility] ENDOSCOPY;  Service: Gastroenterology;  Laterality: N/A;   TUMOR EXCISION  1989   same time as hysterectomy    Family History  Problem Relation Age of Onset   Diabetes Father 42       DM complications   Hypertension Mother 61       CKD   Thyroid disease Mother    Heart failure Mother    Kidney disease Mother    Depression Mother    Breast cancer Sister 47   Depression Sister    Kidney disease Other    Breast cancer Maternal Aunt    Breast cancer Cousin    Anuerysm Son 38   Diabetes Son     Social History   Tobacco Use   Smoking status: Never   Smokeless tobacco: Never   Tobacco comments:    smoking cessation materials not required  Substance Use Topics   Alcohol use: Never    Alcohol/week: 0.0 standard drinks of alcohol     Current Outpatient Medications:    bacitracin 500 UNIT/GM ointment, Apply 1 Application topically 2 (two) times daily., Disp: 15 g, Rfl: 0   Ensure (ENSURE), Take 237 mLs by mouth. Pt drinking approx 3 ensures per day, Disp: , Rfl:    levothyroxine (SYNTHROID) 100 MCG tablet, Take 1 tablet by mouth once daily, Disp: 30 tablet, Rfl: 0   omeprazole (PRILOSEC) 40 MG capsule, Take 1 capsule (40 mg total) by mouth daily before breakfast., Disp: 90 capsule, Rfl: 3  Allergies  Allergen Reactions   Aspirin     Tinnitus   Citalopram Other (See Comments)   Codeine Nausea And Vomiting   Penicillins Rash    Has patient had a PCN reaction causing immediate rash, facial/tongue/throat swelling, SOB or lightheadedness with hypotension: Yes Has patient had a PCN reaction causing severe rash involving mucus membranes or skin necrosis: No Has patient had a PCN reaction that required hospitalization No Has patient had a PCN reaction occurring within the last 10 years: No If all of the  above answers are "NO", then may proceed with Cephalosporin use.    I personally reviewed active problem list, medication list, allergies, family history, social history, health maintenance with the patient/caregiver today.   ROS  ***  Objective  There were no vitals filed for this visit.  There is no height or weight on file to calculate BMI.  Physical Exam ***  No results found for this or any previous visit (from the past 2160 hour(s)).   PHQ2/9:    04/24/2023    1:07 PM 02/25/2022    1:58 PM 11/08/2021    2:22 PM 09/17/2021    3:00 PM 07/08/2021    2:25 PM  Depression screen PHQ 2/9  Decreased Interest 1 2 0 0 1  Down, Depressed, Hopeless 0 2 0  0 1  PHQ - 2 Score 1 4 0 0 2  Altered sleeping  0 0  0  Tired, decreased energy  2 0  1  Change in appetite  2 0  1  Feeling bad or failure about yourself   2 0  1  Trouble concentrating  2 0  0  Moving slowly or fidgety/restless  0 0  0  Suicidal thoughts  0 0  0  PHQ-9 Score  12 0  5  Difficult doing work/chores Not difficult at all Somewhat difficult       phq 9 is {gen pos [id number]}   Fall Risk:    04/24/2023    1:04 PM 08/25/2022    3:51 PM 02/25/2022    1:58 PM 11/08/2021    2:22 PM 09/17/2021    3:04 PM  Fall Risk   Falls in the past year? 0 0 0 0 0  Number falls in past yr: 0   0 0  Injury with Fall? 0   0 0  Risk for fall due to : No Fall Risks No Fall Risks No Fall Risks No Fall Risks No Fall Risks  Follow up Education provided;Falls prevention discussed Falls prevention discussed;Education provided;Falls evaluation completed Falls prevention discussed Falls prevention discussed Falls prevention discussed      Functional Status Survey:      Assessment & Plan  *** There are no diagnoses linked to this encounter.

## 2023-07-01 ENCOUNTER — Ambulatory Visit: Payer: Medicare HMO | Admitting: Family Medicine

## 2023-07-06 NOTE — Progress Notes (Deleted)
Name: Maureen [name]   MRN: [id number]    DOB: May 25, 1946   Date:07/06/2023       Progress Note  Subjective  Chief Complaint  Follow Up/ Labwork  HPI  Malnutrition: She has been evaluation by hematologist , gastroenterologist, psychiatrist and dietician . She continue to refuse to eat ,son states she no longer wants company for when she is eating. We referred her to palliative care and she had some home health that helped her eat but service ended months ago and son is asking if could be re-started. Her son continues to get frustrated with her, explained at this point he needs to offer her a variety of foods, snacks and let her chose what and when to eat, she has a component of eating disorder and also dysphagia. Asked him to talk to her about other things besides food   MDD: she was seen by Dr. Salvatore Decent - psychiatrist , last visit 05/2020 and has been released per patient/son's request, she does not want to take Remeron - afraid of polypharmacy. She was on clonazepam but has been off for months and has not noticed any difference in symptoms. She also stopped megace and Trazodone.    Hypothyroidism: she has been taking medication daily now, 88 mcg daily , she was supposed to be up to 112 mcg daily , we switched from 88 mcg to  100 mcg  last visit and she states she has been complaint but last TSH was still not at goal, not sure if compliant, we will recheck labs today again   . She continues to lose weight, has some dysphagia that is not secondary to thyroid, brittle nails and hair loss but likely from malnutrition we will recheck TSH today and adjust dose if still off   Paroxysmal Afib: she never started Eliquis, she states cardiologist called her for a visit but she did not schedule it. She denies chest pain or palpitation, she is afraid of taking medications.Son is aware of risk of strokes .Unchanged    Atherosclerosis of aorta: found on CT chest , patient is not interested on statin therapy,.    Pancytopenia: under the care of Dr. Orlie Dakin but skips appointments at times, reviewed her last labs   Vitamin D deficiency: she continues to refuse taking it Even the liquid form   Patient Active Problem List   Diagnosis Date Noted  . Hyponatremia 09/24/2020  . Protein-calorie malnutrition, severe 09/13/2020  . Esophageal motility disorder   . Palliative care by specialist   . Advanced care planning/counseling discussion   . Failure to thrive in adult 09/11/2020  . Avoidant-restrictive food intake disorder (ARFID) 08/30/2020  . Paroxysmal atrial fibrillation (HCC) 03/22/2020  . Vitamin D deficiency 03/01/2020  . Dysphagia   . Arrhythmia 08/25/2019  . Rectal bleeding   . Low grade squamous intraepith lesion on cytologic smear cervix (lgsil) 08/04/2018  . Chronic venous insufficiency 07/05/2018  . Lymphedema 07/05/2018  . Hypertension 04/02/2018  . Swelling of limb 04/02/2018  . Depression, major, recurrent, mild (HCC) 12/29/2017  . Leukopenia 05/24/2017  . Allergic rhinitis, seasonal 06/29/2015  . Clavus 06/29/2015  . 1st degree AV block 06/29/2015  . Hammer toe 06/29/2015  . H/O: HTN (hypertension) 06/29/2015  . Blood glucose elevated 06/29/2015  . Floater, vitreous 06/29/2015  . Bilateral tinnitus 06/29/2015  . Hypothyroidism (acquired) 04/02/2015  . Gastroesophageal reflux disease 04/02/2015  . Hammer toe of right foot 04/02/2015  . Scoliosis of thoracic spine 04/02/2015  . Urethral prolapse  04/02/2015  . Corn of toe 04/02/2015  . Hyperlipidemia 04/02/2015  . Varicose veins of both lower extremities 04/02/2015    Past Surgical History:  Procedure Laterality Date  . ABDOMINAL HYSTERECTOMY  1989  . APPENDECTOMY  1989  . BREAST EXCISIONAL BIOPSY Bilateral    multiple biopsies negative  . BREAST SURGERY     Several  . COLONOSCOPY WITH PROPOFOL N/A 07/28/2019   Procedure: COLONOSCOPY WITH PROPOFOL;  Surgeon: Toney Reil, MD;  Location: [facility] ENDOSCOPY;   Service: Gastroenterology;  Laterality: N/A;  . ESOPHAGOGASTRODUODENOSCOPY (EGD) WITH PROPOFOL N/A 11/11/2019   Procedure: ESOPHAGOGASTRODUODENOSCOPY (EGD) WITH PROPOFOL;  Surgeon: Toney Reil, MD;  Location[facility] ENDOSCOPY;  Service: Gastroenterology;  Laterality: N/A;  . TUMOR EXCISION  1989   same time as hysterectomy    Family History  Problem Relation Age of Onset  . Diabetes Father 62       DM complications  . Hypertension Mother 66       CKD  . Thyroid disease Mother   . Heart failure Mother   . Kidney disease Mother   . Depression Mother   . Breast cancer Sister 1  . Depression Sister   . Kidney disease Other   . Breast cancer Maternal Aunt   . Breast cancer Cousin   . Anuerysm Son 51  . Diabetes Son     Social History   Tobacco Use  . Smoking status: Never  . Smokeless tobacco: Never  . Tobacco comments:    smoking cessation materials not required  Substance Use Topics  . Alcohol use: Never    Alcohol/week: 0.0 standard drinks of alcohol     Current Outpatient Medications:  .  bacitracin 500 UNIT/GM ointment, Apply 1 Application topically 2 (two) times daily., Disp: 15 g, Rfl: 0 .  Ensure (ENSURE), Take 237 mLs by mouth. Pt drinking approx 3 ensures per day, Disp: , Rfl:  .  levothyroxine (SYNTHROID) 100 MCG tablet, Take 1 tablet by mouth once daily, Disp: 30 tablet, Rfl: 0 .  omeprazole (PRILOSEC) 40 MG capsule, Take 1 capsule (40 mg total) by mouth daily before breakfast., Disp: 90 capsule, Rfl: 3  Allergies  Allergen Reactions  . Aspirin     Tinnitus  . Citalopram Other (See Comments)  . Codeine Nausea And Vomiting  . Penicillins Rash    Has patient had a PCN reaction causing immediate rash, facial/tongue/throat swelling, SOB or lightheadedness with hypotension: Yes Has patient had a PCN reaction causing severe rash involving mucus membranes or skin necrosis: No Has patient had a PCN reaction that required hospitalization No Has patient had a  PCN reaction occurring within the last 10 years: No If all of the above answers are "NO", then may proceed with Cephalosporin use.    I personally reviewed active problem list, medication list, allergies, family history, social history, health maintenance with the patient/caregiver today.   ROS  ***  Objective  There were no vitals filed for this visit.  There is no height or weight on file to calculate BMI.  Physical Exam ***  No results found for this or any previous visit (from the past 2160 hour(s)).   PHQ2/9:    04/24/2023    1:07 PM 02/25/2022    1:58 PM 11/08/2021    2:22 PM 09/17/2021    3:00 PM 07/08/2021    2:25 PM  Depression screen PHQ 2/9  Decreased Interest 1 2 0 0 1  Down, Depressed, Hopeless 0 2 0  0 1  PHQ - 2 Score 1 4 0 0 2  Altered sleeping  0 0  0  Tired, decreased energy  2 0  1  Change in appetite  2 0  1  Feeling bad or failure about yourself   2 0  1  Trouble concentrating  2 0  0  Moving slowly or fidgety/restless  0 0  0  Suicidal thoughts  0 0  0  PHQ-9 Score  12 0  5  Difficult doing work/chores Not difficult at all Somewhat difficult       phq 9 is {gen pos [id number]}   Fall Risk:    04/24/2023    1:04 PM 08/25/2022    3:51 PM 02/25/2022    1:58 PM 11/08/2021    2:22 PM 09/17/2021    3:04 PM  Fall Risk   Falls in the past year? 0 0 0 0 0  Number falls in past yr: 0   0 0  Injury with Fall? 0   0 0  Risk for fall due to : No Fall Risks No Fall Risks No Fall Risks No Fall Risks No Fall Risks  Follow up Education provided;Falls prevention discussed Falls prevention discussed;Education provided;Falls evaluation completed Falls prevention discussed Falls prevention discussed Falls prevention discussed      Functional Status Survey:      Assessment & Plan  *** There are no diagnoses linked to this encounter.

## 2023-07-07 ENCOUNTER — Ambulatory Visit: Payer: Medicare HMO | Admitting: Family Medicine

## 2023-07-08 ENCOUNTER — Telehealth: Payer: Self-pay

## 2023-07-08 NOTE — Telephone Encounter (Signed)
Transition Care Management Unsuccessful Follow-up Telephone Call  Date of discharge and from where:  06/25/2023 [facility]  Attempts:  2nd Attempt  Reason for unsuccessful TCM follow-up call:  Left voice message  Alfrieda Tarry [facility]  [facility] Resource Care Guide Direct Dial: [phone]  Website: [email or web address]

## 2023-07-08 NOTE — Telephone Encounter (Signed)
Transition Care Management Unsuccessful Follow-up Telephone Call  Date of discharge and from where:  06/25/2023 [facility]  Attempts:  1st Attempt  Reason for unsuccessful TCM follow-up call:  Left voice message  Jalaysha Skilton [facility]  [facility], [facility] Resource Care Guide Direct Dial: [phone]  Website: [email or web address]

## 2023-07-13 NOTE — Progress Notes (Deleted)
Name: Maureen [name]   MRN: [id number]    DOB: [birth year]   Date:07/13/2023       Progress Note  Subjective  Chief Complaint  Follow Up/ Labwork  HPI  Malnutrition: She has been evaluation by hematologist , gastroenterologist, psychiatrist and dietician . She continue to refuse to eat ,son states she no longer wants company for when she is eating. We referred her to palliative care and she had some home health that helped her eat but service ended months ago and son is asking if could be re-started. Her son continues to get frustrated with her, explained at this point he needs to offer her a variety of foods, snacks and let her chose what and when to eat, she has a component of eating disorder and also dysphagia. Asked him to talk to her about other things besides food   MDD: she was seen by Dr. Salvatore Decent - psychiatrist , last visit 05/2020 and has been released per patient/son's request, she does not want to take Remeron - afraid of polypharmacy. She was on clonazepam but has been off for months and has not noticed any difference in symptoms. She also stopped megace and Trazodone.    Hypothyroidism: she has been taking medication daily now, 88 mcg daily , she was supposed to be up to 112 mcg daily , we switched from 88 mcg to  100 mcg  last visit and she states she has been complaint but last TSH was still not at goal, not sure if compliant, we will recheck labs today again   . She continues to lose weight, has some dysphagia that is not secondary to thyroid, brittle nails and hair loss but likely from malnutrition we will recheck TSH today and adjust dose if still off   Paroxysmal Afib: she never started Eliquis, she states cardiologist called her for a visit but she did not schedule it. She denies chest pain or palpitation, she is afraid of taking medications.Son is aware of risk of strokes .Unchanged    Atherosclerosis of aorta: found on CT chest , patient is not interested on statin therapy,.    Pancytopenia: under the care of Dr. Orlie Dakin but skips appointments at times, reviewed her last labs   Vitamin D deficiency: she continues to refuse taking it Even the liquid form   Patient Active Problem List   Diagnosis Date Noted  . Hyponatremia 09/24/2020  . Protein-calorie malnutrition, severe 09/13/2020  . Esophageal motility disorder   . Palliative care by specialist   . Advanced care planning/counseling discussion   . Failure to thrive in adult 09/11/2020  . Avoidant-restrictive food intake disorder (ARFID) 08/30/2020  . Paroxysmal atrial fibrillation (HCC) 03/22/2020  . Vitamin D deficiency 03/01/2020  . Dysphagia   . Arrhythmia 08/25/2019  . Rectal bleeding   . Low grade squamous intraepith lesion on cytologic smear cervix (lgsil) 08/04/2018  . Chronic venous insufficiency 07/05/2018  . Lymphedema 07/05/2018  . Hypertension 04/02/2018  . Swelling of limb 04/02/2018  . Depression, major, recurrent, mild (HCC) 12/29/2017  . Leukopenia 05/24/2017  . Allergic rhinitis, seasonal 06/29/2015  . Clavus 06/29/2015  . 1st degree AV block 06/29/2015  . Hammer toe 06/29/2015  . H/O: HTN (hypertension) 06/29/2015  . Blood glucose elevated 06/29/2015  . Floater, vitreous 06/29/2015  . Bilateral tinnitus 06/29/2015  . Hypothyroidism (acquired) 04/02/2015  . Gastroesophageal reflux disease 04/02/2015  . Hammer toe of right foot 04/02/2015  . Scoliosis of thoracic spine 04/02/2015  . Urethral prolapse  04/02/2015  . Corn of toe 04/02/2015  . Hyperlipidemia 04/02/2015  . Varicose veins of both lower extremities 04/02/2015    Past Surgical History:  Procedure Laterality Date  . ABDOMINAL HYSTERECTOMY  1989  . APPENDECTOMY  1989  . BREAST EXCISIONAL BIOPSY Bilateral    multiple biopsies negative  . BREAST SURGERY     Several  . COLONOSCOPY WITH PROPOFOL N/A 07/28/2019   Procedure: COLONOSCOPY WITH PROPOFOL;  Surgeon: Toney Reil, MD;  Location: [facility] ENDOSCOPY;   Service: Gastroenterology;  Laterality: N/A;  . ESOPHAGOGASTRODUODENOSCOPY (EGD) WITH PROPOFOL N/A 11/11/2019   Procedure: ESOPHAGOGASTRODUODENOSCOPY (EGD) WITH PROPOFOL;  Surgeon: Toney Reil, MD;  Location: [facility] ENDOSCOPY;  Service: Gastroenterology;  Laterality: N/A;  . TUMOR EXCISION  1989   same time as hysterectomy    Family History  Problem Relation Age of Onset  . Diabetes Father 47       DM complications  . Hypertension Mother 21       CKD  . Thyroid disease Mother   . Heart failure Mother   . Kidney disease Mother   . Depression Mother   . Breast cancer Sister 35  . Depression Sister   . Kidney disease Other   . Breast cancer Maternal Aunt   . Breast cancer Cousin   . Anuerysm Son 51  . Diabetes Son     Social History   Tobacco Use  . Smoking status: Never  . Smokeless tobacco: Never  . Tobacco comments:    smoking cessation materials not required  Substance Use Topics  . Alcohol use: Never    Alcohol/week: 0.0 standard drinks of alcohol     Current Outpatient Medications:  .  bacitracin 500 UNIT/GM ointment, Apply 1 Application topically 2 (two) times daily., Disp: 15 g, Rfl: 0 .  Ensure (ENSURE), Take 237 mLs by mouth. Pt drinking approx 3 ensures per day, Disp: , Rfl:  .  levothyroxine (SYNTHROID) 100 MCG tablet, Take 1 tablet by mouth once daily, Disp: 30 tablet, Rfl: 0 .  omeprazole (PRILOSEC) 40 MG capsule, Take 1 capsule (40 mg total) by mouth daily before breakfast., Disp: 90 capsule, Rfl: 3  Allergies  Allergen Reactions  . Aspirin     Tinnitus  . Citalopram Other (See Comments)  . Codeine Nausea And Vomiting  . Penicillins Rash    Has patient had a PCN reaction causing immediate rash, facial/tongue/throat swelling, SOB or lightheadedness with hypotension: Yes Has patient had a PCN reaction causing severe rash involving mucus membranes or skin necrosis: No Has patient had a PCN reaction that required hospitalization No Has patient had a  PCN reaction occurring within the last 10 years: No If all of the above answers are "NO", then may proceed with Cephalosporin use.    I personally reviewed active problem list, medication list, allergies, family history, social history, health maintenance with the patient/caregiver today.   ROS  ***  Objective  There were no vitals filed for this visit.  There is no height or weight on file to calculate BMI.  Physical Exam ***  No results found for this or any previous visit (from the past 2160 hour(s)).   PHQ2/9:    04/24/2023    1:07 PM 02/25/2022    1:58 PM 11/08/2021    2:22 PM 09/17/2021    3:00 PM 07/08/2021    2:25 PM  Depression screen PHQ 2/9  Decreased Interest 1 2 0 0 1  Down, Depressed, Hopeless 0 2 0  0 1  PHQ - 2 Score 1 4 0 0 2  Altered sleeping  0 0  0  Tired, decreased energy  2 0  1  Change in appetite  2 0  1  Feeling bad or failure about yourself   2 0  1  Trouble concentrating  2 0  0  Moving slowly or fidgety/restless  0 0  0  Suicidal thoughts  0 0  0  PHQ-9 Score  12 0  5  Difficult doing work/chores Not difficult at all Somewhat difficult       phq 9 is {gen pos [id number]}   Fall Risk:    04/24/2023    1:04 PM 08/25/2022    3:51 PM 02/25/2022    1:58 PM 11/08/2021    2:22 PM 09/17/2021    3:04 PM  Fall Risk   Falls in the past year? 0 0 0 0 0  Number falls in past yr: 0   0 0  Injury with Fall? 0   0 0  Risk for fall due to : No Fall Risks No Fall Risks No Fall Risks No Fall Risks No Fall Risks  Follow up Education provided;Falls prevention discussed Falls prevention discussed;Education provided;Falls evaluation completed Falls prevention discussed Falls prevention discussed Falls prevention discussed      Functional Status Survey:      Assessment & Plan  *** There are no diagnoses linked to this encounter.

## 2023-07-14 ENCOUNTER — Ambulatory Visit: Payer: Medicare HMO | Admitting: Family Medicine

## 2023-09-14 NOTE — Progress Notes (Deleted)
Name: Maureen [name]   MRN: [id number]    DOB: Mar 18, 1946   Date:09/14/2023       Progress Note  Subjective  Chief Complaint  Follow Up  HPI  Malnutrition: She has been evaluation by hematologist , gastroenterologist, psychiatrist and dietician . She continue to refuse to eat ,son states she no longer wants company for when she is eating. We referred her to palliative care and she had some home health that helped her eat but service ended months ago and son is asking if could be re-started. Her son continues to get frustrated with her, explained at this point he needs to offer her a variety of foods, snacks and let her chose what and when to eat, she has a component of eating disorder and also dysphagia. Asked him to talk to her about other things besides food   MDD: she was seen by Dr. Salvatore Decent - psychiatrist , last visit 05/2020 and has been released per patient/son's request, she does not want to take Remeron - afraid of polypharmacy. She was on clonazepam but has been off for months and has not noticed any difference in symptoms. She also stopped megace and Trazodone.    Hypothyroidism: she has been taking medication daily now, 88 mcg daily , she was supposed to be up to 112 mcg daily , we switched from 88 mcg to  100 mcg  last visit and she states she has been complaint but last TSH was still not at goal, not sure if compliant, we will recheck labs today again   . She continues to lose weight, has some dysphagia that is not secondary to thyroid, brittle nails and hair loss but likely from malnutrition we will recheck TSH today and adjust dose if still off   Paroxysmal Afib: she never started Eliquis, she states cardiologist called her for a visit but she did not schedule it. She denies chest pain or palpitation, she is afraid of taking medications.Son is aware of risk of strokes .Unchanged    Atherosclerosis of aorta: found on CT chest , patient is not interested on statin therapy,.    Pancytopenia: under the care of Dr. Orlie Dakin but skips appointments at times, reviewed her last labs   Vitamin D deficiency: she continues to refuse taking it Even the liquid form   Patient Active Problem List   Diagnosis Date Noted   Hyponatremia 09/24/2020   Protein-calorie malnutrition, severe 09/13/2020   Esophageal motility disorder    Palliative care by specialist    Advanced care planning/counseling discussion    Failure to thrive in adult 09/11/2020   Avoidant-restrictive food intake disorder (ARFID) 08/30/2020   Paroxysmal atrial fibrillation (HCC) 03/22/2020   Vitamin D deficiency 03/01/2020   Dysphagia    Arrhythmia 08/25/2019   Rectal bleeding    Low grade squamous intraepith lesion on cytologic smear cervix (lgsil) 08/04/2018   Chronic venous insufficiency 07/05/2018   Lymphedema 07/05/2018   Hypertension 04/02/2018   Swelling of limb 04/02/2018   Depression, major, recurrent, mild (HCC) 12/29/2017   Leukopenia 05/24/2017   Allergic rhinitis, seasonal 06/29/2015   Clavus 06/29/2015   1st degree AV block 06/29/2015   Hammer toe 06/29/2015   H/O: HTN (hypertension) 06/29/2015   Blood glucose elevated 06/29/2015   Floater, vitreous 06/29/2015   Bilateral tinnitus 06/29/2015   Hypothyroidism (acquired) 04/02/2015   Gastroesophageal reflux disease 04/02/2015   Hammer toe of right foot 04/02/2015   Scoliosis of thoracic spine 04/02/2015   Urethral prolapse 04/02/2015  Corn of toe 04/02/2015   Hyperlipidemia 04/02/2015   Varicose veins of both lower extremities 04/02/2015    Past Surgical History:  Procedure Laterality Date   ABDOMINAL HYSTERECTOMY  1989   APPENDECTOMY  1989   BREAST EXCISIONAL BIOPSY Bilateral    multiple biopsies negative   BREAST SURGERY     Several   COLONOSCOPY WITH PROPOFOL N/A 07/28/2019   Procedure: COLONOSCOPY WITH PROPOFOL;  Surgeon: Toney Reil, MD;  Location: [facility] ENDOSCOPY;  Service: Gastroenterology;  Laterality:  N/A;   ESOPHAGOGASTRODUODENOSCOPY (EGD) WITH PROPOFOL N/A 11/11/2019   Procedure: ESOPHAGOGASTRODUODENOSCOPY (EGD) WITH PROPOFOL;  Surgeon: Toney Reil, MD;  Location: [facility] ENDOSCOPY;  Service: Gastroenterology;  Laterality: N/A;   TUMOR EXCISION  1989   same time as hysterectomy    Family History  Problem Relation Age of Onset   Diabetes Father 21       DM complications   Hypertension Mother 26       CKD   Thyroid disease Mother    Heart failure Mother    Kidney disease Mother    Depression Mother    Breast cancer Sister 15   Depression Sister    Kidney disease Other    Breast cancer Maternal Aunt    Breast cancer Cousin    Anuerysm Son 23   Diabetes Son     Social History   Tobacco Use   Smoking status: Never   Smokeless tobacco: Never   Tobacco comments:    smoking cessation materials not required  Substance Use Topics   Alcohol use: Never    Alcohol/week: 0.0 standard drinks of alcohol     Current Outpatient Medications:    bacitracin 500 UNIT/GM ointment, Apply 1 Application topically 2 (two) times daily., Disp: 15 g, Rfl: 0   Ensure (ENSURE), Take 237 mLs by mouth. Pt drinking approx 3 ensures per day, Disp: , Rfl:    levothyroxine (SYNTHROID) 100 MCG tablet, Take 1 tablet by mouth once daily, Disp: 30 tablet, Rfl: 0   omeprazole (PRILOSEC) 40 MG capsule, Take 1 capsule (40 mg total) by mouth daily before breakfast., Disp: 90 capsule, Rfl: 3  Allergies  Allergen Reactions   Aspirin     Tinnitus   Citalopram Other (See Comments)   Codeine Nausea And Vomiting   Penicillins Rash    Has patient had a PCN reaction causing immediate rash, facial/tongue/throat swelling, SOB or lightheadedness with hypotension: Yes Has patient had a PCN reaction causing severe rash involving mucus membranes or skin necrosis: No Has patient had a PCN reaction that required hospitalization No Has patient had a PCN reaction occurring within the last 10 years: No If all of the  above answers are "NO", then may proceed with Cephalosporin use.    I personally reviewed active problem list, medication list, allergies, family history, social history, health maintenance with the patient/caregiver today.   ROS  ***  Objective  There were no vitals filed for this visit.  There is no height or weight on file to calculate BMI.  Physical Exam ***  No results found for this or any previous visit (from the past 2160 hour(s)).   PHQ2/9:    04/24/2023    1:07 PM 02/25/2022    1:58 PM 11/08/2021    2:22 PM 09/17/2021    3:00 PM 07/08/2021    2:25 PM  Depression screen PHQ 2/9  Decreased Interest 1 2 0 0 1  Down, Depressed, Hopeless 0 2 0 0 1  PHQ - 2 Score 1 4 0 0 2  Altered sleeping  0 0  0  Tired, decreased energy  2 0  1  Change in appetite  2 0  1  Feeling bad or failure about yourself   2 0  1  Trouble concentrating  2 0  0  Moving slowly or fidgety/restless  0 0  0  Suicidal thoughts  0 0  0  PHQ-9 Score  12 0  5  Difficult doing work/chores Not difficult at all Somewhat difficult       phq 9 is {gen pos [id number]}   Fall Risk:    04/24/2023    1:04 PM 08/25/2022    3:51 PM 02/25/2022    1:58 PM 11/08/2021    2:22 PM 09/17/2021    3:04 PM  Fall Risk   Falls in the past year? 0 0 0 0 0  Number falls in past yr: 0   0 0  Injury with Fall? 0   0 0  Risk for fall due to : No Fall Risks No Fall Risks No Fall Risks No Fall Risks No Fall Risks  Follow up Education provided;Falls prevention discussed Falls prevention discussed;Education provided;Falls evaluation completed Falls prevention discussed Falls prevention discussed Falls prevention discussed      Functional Status Survey:      Assessment & Plan  *** There are no diagnoses linked to this encounter.

## 2023-09-15 ENCOUNTER — Ambulatory Visit: Payer: Medicare HMO | Admitting: Family Medicine

## 2023-09-23 NOTE — Progress Notes (Unsigned)
Name: Maureen [name]   MRN: [id number]    DOB: [birth year]   Date:09/23/2023       Progress Note  Subjective  Chief Complaint  No chief complaint on file.   HPI  *** Patient Active Problem List   Diagnosis Date Noted   Hyponatremia 09/24/2020   Protein-calorie malnutrition, severe 09/13/2020   Esophageal motility disorder    Palliative care by specialist    Advanced care planning/counseling discussion    Failure to thrive in adult 09/11/2020   Avoidant-restrictive food intake disorder (ARFID) 08/30/2020   Paroxysmal atrial fibrillation (HCC) 03/22/2020   Vitamin D deficiency 03/01/2020   Dysphagia    Arrhythmia 08/25/2019   Rectal bleeding    Low grade squamous intraepith lesion on cytologic smear cervix (lgsil) 08/04/2018   Chronic venous insufficiency 07/05/2018   Lymphedema 07/05/2018   Hypertension 04/02/2018   Swelling of limb 04/02/2018   Depression, major, recurrent, mild (HCC) 12/29/2017   Leukopenia 05/24/2017   Allergic rhinitis, seasonal 06/29/2015   Clavus 06/29/2015   1st degree AV block 06/29/2015   Hammer toe 06/29/2015   H/O: HTN (hypertension) 06/29/2015   Blood glucose elevated 06/29/2015   Floater, vitreous 06/29/2015   Bilateral tinnitus 06/29/2015   Hypothyroidism (acquired) 04/02/2015   Gastroesophageal reflux disease 04/02/2015   Hammer toe of right foot 04/02/2015   Scoliosis of thoracic spine 04/02/2015   Urethral prolapse 04/02/2015   Corn of toe 04/02/2015   Hyperlipidemia 04/02/2015   Varicose veins of both lower extremities 04/02/2015    Past Surgical History:  Procedure Laterality Date   ABDOMINAL HYSTERECTOMY  1989   APPENDECTOMY  1989   BREAST EXCISIONAL BIOPSY Bilateral    multiple biopsies negative   BREAST SURGERY     Several   COLONOSCOPY WITH PROPOFOL N/A 07/28/2019   Procedure: COLONOSCOPY WITH PROPOFOL;  Surgeon: Toney Reil, MD;  Location: [facility] ENDOSCOPY;  Service: Gastroenterology;  Laterality: N/A;    ESOPHAGOGASTRODUODENOSCOPY (EGD) WITH PROPOFOL N/A 11/11/2019   Procedure: ESOPHAGOGASTRODUODENOSCOPY (EGD) WITH PROPOFOL;  Surgeon: Toney Reil, MD;  Location: [facility] ENDOSCOPY;  Service: Gastroenterology;  Laterality: N/A;   TUMOR EXCISION  1989   same time as hysterectomy    Family History  Problem Relation Age of Onset   Diabetes Father 7       DM complications   Hypertension Mother 44       CKD   Thyroid disease Mother    Heart failure Mother    Kidney disease Mother    Depression Mother    Breast cancer Sister 82   Depression Sister    Kidney disease Other    Breast cancer Maternal Aunt    Breast cancer Cousin    Anuerysm Son 62   Diabetes Son     Social History   Tobacco Use   Smoking status: Never   Smokeless tobacco: Never   Tobacco comments:    smoking cessation materials not required  Substance Use Topics   Alcohol use: Never    Alcohol/week: 0.0 standard drinks of alcohol     Current Outpatient Medications:    bacitracin 500 UNIT/GM ointment, Apply 1 Application topically 2 (two) times daily., Disp: 15 g, Rfl: 0   Ensure (ENSURE), Take 237 mLs by mouth. Pt drinking approx 3 ensures per day, Disp: , Rfl:    levothyroxine (SYNTHROID) 100 MCG tablet, Take 1 tablet by mouth once daily, Disp: 30 tablet, Rfl: 0   omeprazole (PRILOSEC) 40 MG capsule, Take 1 capsule (40  mg total) by mouth daily before breakfast., Disp: 90 capsule, Rfl: 3  Allergies  Allergen Reactions   Aspirin     Tinnitus   Citalopram Other (See Comments)   Codeine Nausea And Vomiting   Penicillins Rash    Has patient had a PCN reaction causing immediate rash, facial/tongue/throat swelling, SOB or lightheadedness with hypotension: Yes Has patient had a PCN reaction causing severe rash involving mucus membranes or skin necrosis: No Has patient had a PCN reaction that required hospitalization No Has patient had a PCN reaction occurring within the last 10 years: No If all of the above  answers are "NO", then may proceed with Cephalosporin use.    I personally reviewed {Reviewed:14835} with the patient/caregiver today.   ROS  ***  Objective  There were no vitals filed for this visit.  There is no height or weight on file to calculate BMI.  Physical Exam ***  No results found for this or any previous visit (from the past 2160 hour(s)).  Diabetic Foot Exam: Diabetic Foot Exam - Simple   No data filed    ***  PHQ2/9:    04/24/2023    1:07 PM 02/25/2022    1:58 PM 11/08/2021    2:22 PM 09/17/2021    3:00 PM 07/08/2021    2:25 PM  Depression screen PHQ 2/9  Decreased Interest 1 2 0 0 1  Down, Depressed, Hopeless 0 2 0 0 1  PHQ - 2 Score 1 4 0 0 2  Altered sleeping  0 0  0  Tired, decreased energy  2 0  1  Change in appetite  2 0  1  Feeling bad or failure about yourself   2 0  1  Trouble concentrating  2 0  0  Moving slowly or fidgety/restless  0 0  0  Suicidal thoughts  0 0  0  PHQ-9 Score  12 0  5  Difficult doing work/chores Not difficult at all Somewhat difficult       phq 9 is {gen pos [id number]} ***  Fall Risk:    04/24/2023    1:04 PM 08/25/2022    3:51 PM 02/25/2022    1:58 PM 11/08/2021    2:22 PM 09/17/2021    3:04 PM  Fall Risk   Falls in the past year? 0 0 0 0 0  Number falls in past yr: 0   0 0  Injury with Fall? 0   0 0  Risk for fall due to : No Fall Risks No Fall Risks No Fall Risks No Fall Risks No Fall Risks  Follow up Education provided;Falls prevention discussed Falls prevention discussed;Education provided;Falls evaluation completed Falls prevention discussed Falls prevention discussed Falls prevention discussed   ***   Functional Status Survey:   ***   Assessment & Plan  *** There are no diagnoses linked to this encounter.

## 2023-09-28 ENCOUNTER — Ambulatory Visit: Payer: Medicare HMO | Admitting: Family Medicine

## 2023-09-28 DIAGNOSIS — Z23 Encounter for immunization: Secondary | ICD-10-CM

## 2023-09-28 DIAGNOSIS — Z1231 Encounter for screening mammogram for malignant neoplasm of breast: Secondary | ICD-10-CM

## 2023-10-01 NOTE — Progress Notes (Unsigned)
Name: Maureen [name]   MRN: [id number]    DOB: Oct 13, 1946   Date:10/01/2023       Progress Note  Subjective  Chief Complaint  No chief complaint on file.   HPI  *** Patient Active Problem List   Diagnosis Date Noted   Hyponatremia 09/24/2020   Protein-calorie malnutrition, severe 09/13/2020   Esophageal motility disorder    Palliative care by specialist    Advanced care planning/counseling discussion    Failure to thrive in adult 09/11/2020   Avoidant-restrictive food intake disorder (ARFID) 08/30/2020   Paroxysmal atrial fibrillation (HCC) 03/22/2020   Vitamin D deficiency 03/01/2020   Dysphagia    Arrhythmia 08/25/2019   Rectal bleeding    Low grade squamous intraepith lesion on cytologic smear cervix (lgsil) 08/04/2018   Chronic venous insufficiency 07/05/2018   Lymphedema 07/05/2018   Hypertension 04/02/2018   Swelling of limb 04/02/2018   Depression, major, recurrent, mild (HCC) 12/29/2017   Leukopenia 05/24/2017   Allergic rhinitis, seasonal 06/29/2015   Clavus 06/29/2015   1st degree AV block 06/29/2015   Hammer toe 06/29/2015   H/O: HTN (hypertension) 06/29/2015   Blood glucose elevated 06/29/2015   Floater, vitreous 06/29/2015   Bilateral tinnitus 06/29/2015   Hypothyroidism (acquired) 04/02/2015   Gastroesophageal reflux disease 04/02/2015   Hammer toe of right foot 04/02/2015   Scoliosis of thoracic spine 04/02/2015   Urethral prolapse 04/02/2015   Corn of toe 04/02/2015   Hyperlipidemia 04/02/2015   Varicose veins of both lower extremities 04/02/2015    Past Surgical History:  Procedure Laterality Date   ABDOMINAL HYSTERECTOMY  1989   APPENDECTOMY  1989   BREAST EXCISIONAL BIOPSY Bilateral    multiple biopsies negative   BREAST SURGERY     Several   COLONOSCOPY WITH PROPOFOL N/A 07/28/2019   Procedure: COLONOSCOPY WITH PROPOFOL;  Surgeon: Toney Reil, MD;  Location: [facility] ENDOSCOPY;  Service: Gastroenterology;  Laterality: N/A;    ESOPHAGOGASTRODUODENOSCOPY (EGD) WITH PROPOFOL N/A 11/11/2019   Procedure: ESOPHAGOGASTRODUODENOSCOPY (EGD) WITH PROPOFOL;  Surgeon: Toney Reil, MD;  Location: [facility] ENDOSCOPY;  Service: Gastroenterology;  Laterality: N/A;   TUMOR EXCISION  1989   same time as hysterectomy    Family History  Problem Relation Age of Onset   Diabetes Father 63       DM complications   Hypertension Mother 48       CKD   Thyroid disease Mother    Heart failure Mother    Kidney disease Mother    Depression Mother    Breast cancer Sister 55   Depression Sister    Kidney disease Other    Breast cancer Maternal Aunt    Breast cancer Cousin    Anuerysm Son 68   Diabetes Son     Social History   Tobacco Use   Smoking status: Never   Smokeless tobacco: Never   Tobacco comments:    smoking cessation materials not required  Substance Use Topics   Alcohol use: Never    Alcohol/week: 0.0 standard drinks of alcohol     Current Outpatient Medications:    bacitracin 500 UNIT/GM ointment, Apply 1 Application topically 2 (two) times daily., Disp: 15 g, Rfl: 0   Ensure (ENSURE), Take 237 mLs by mouth. Pt drinking approx 3 ensures per day, Disp: , Rfl:    levothyroxine (SYNTHROID) 100 MCG tablet, Take 1 tablet by mouth once daily, Disp: 30 tablet, Rfl: 0   omeprazole (PRILOSEC) 40 MG capsule, Take 1 capsule (40  mg total) by mouth daily before breakfast., Disp: 90 capsule, Rfl: 3  Allergies  Allergen Reactions   Aspirin     Tinnitus   Citalopram Other (See Comments)   Codeine Nausea And Vomiting   Penicillins Rash    Has patient had a PCN reaction causing immediate rash, facial/tongue/throat swelling, SOB or lightheadedness with hypotension: Yes Has patient had a PCN reaction causing severe rash involving mucus membranes or skin necrosis: No Has patient had a PCN reaction that required hospitalization No Has patient had a PCN reaction occurring within the last 10 years: No If all of the above  answers are "NO", then may proceed with Cephalosporin use.    I personally reviewed {Reviewed:[id number]} with the patient/caregiver today.   ROS  ***  Objective  There were no vitals filed for this visit.  There is no height or weight on file to calculate BMI.  Physical Exam ***  No results found for this or any previous visit (from the past 2160 hour(s)).  Diabetic Foot Exam: Diabetic Foot Exam - Simple   No data filed    ***  PHQ2/9:    04/24/2023    1:07 PM 02/25/2022    1:58 PM 11/08/2021    2:22 PM 09/17/2021    3:00 PM 07/08/2021    2:25 PM  Depression screen PHQ 2/9  Decreased Interest 1 2 0 0 1  Down, Depressed, Hopeless 0 2 0 0 1  PHQ - 2 Score 1 4 0 0 2  Altered sleeping  0 0  0  Tired, decreased energy  2 0  1  Change in appetite  2 0  1  Feeling bad or failure about yourself   2 0  1  Trouble concentrating  2 0  0  Moving slowly or fidgety/restless  0 0  0  Suicidal thoughts  0 0  0  PHQ-9 Score  12 0  5  Difficult doing work/chores Not difficult at all Somewhat difficult       phq 9 is {gen pos [id number]} ***  Fall Risk:    04/24/2023    1:04 PM 08/25/2022    3:51 PM 02/25/2022    1:58 PM 11/08/2021    2:22 PM 09/17/2021    3:04 PM  Fall Risk   Falls in the past year? 0 0 0 0 0  Number falls in past yr: 0   0 0  Injury with Fall? 0   0 0  Risk for fall due to : No Fall Risks No Fall Risks No Fall Risks No Fall Risks No Fall Risks  Follow up Education provided;Falls prevention discussed Falls prevention discussed;Education provided;Falls evaluation completed Falls prevention discussed Falls prevention discussed Falls prevention discussed   ***   Functional Status Survey:   ***   Assessment & Plan  *** There are no diagnoses linked to this encounter.

## 2023-10-09 ENCOUNTER — Ambulatory Visit: Payer: Medicare HMO | Admitting: Family Medicine

## 2023-10-09 DIAGNOSIS — Z1231 Encounter for screening mammogram for malignant neoplasm of breast: Secondary | ICD-10-CM

## 2023-10-09 DIAGNOSIS — Z23 Encounter for immunization: Secondary | ICD-10-CM

## 2023-11-04 ENCOUNTER — Ambulatory Visit: Payer: Medicare HMO | Admitting: Family Medicine

## 2023-11-10 ENCOUNTER — Ambulatory Visit: Payer: Medicare HMO | Admitting: Family Medicine

## 2023-11-17 ENCOUNTER — Ambulatory Visit: Payer: Medicare HMO | Admitting: Family Medicine

## 2023-11-23 ENCOUNTER — Ambulatory Visit: Payer: Medicare HMO | Admitting: Family Medicine

## 2023-11-25 ENCOUNTER — Ambulatory Visit: Payer: Medicare HMO | Admitting: Family Medicine

## 2023-11-25 ENCOUNTER — Other Ambulatory Visit: Payer: Self-pay

## 2023-12-09 ENCOUNTER — Ambulatory Visit: Payer: Self-pay | Admitting: Family Medicine

## 2023-12-11 ENCOUNTER — Ambulatory Visit: Payer: Medicare HMO | Admitting: Family Medicine

## 2023-12-14 ENCOUNTER — Ambulatory Visit: Payer: Medicare HMO | Admitting: Family Medicine

## 2023-12-22 ENCOUNTER — Ambulatory Visit: Payer: Medicare HMO | Admitting: Family Medicine

## 2023-12-29 ENCOUNTER — Ambulatory Visit: Payer: Medicare HMO | Admitting: Family Medicine

## 2024-01-25 ENCOUNTER — Ambulatory Visit: Admitting: Family Medicine

## 2024-02-01 ENCOUNTER — Ambulatory Visit: Admitting: Family Medicine

## 2024-04-28 ENCOUNTER — Ambulatory Visit: Payer: Medicare HMO
